# Patient Record
Sex: Male | Born: 1935
Health system: Southern US, Community
[De-identification: ages and names within clinical notes are randomized; demographics above are authoritative.]

## PROBLEM LIST (undated history)

## (undated) DIAGNOSIS — A4101 Sepsis due to Methicillin susceptible Staphylococcus aureus: Secondary | ICD-10-CM

## (undated) DIAGNOSIS — R0602 Shortness of breath: Secondary | ICD-10-CM

## (undated) DIAGNOSIS — T4145XA Adverse effect of unspecified anesthetic, initial encounter: Secondary | ICD-10-CM

## (undated) DIAGNOSIS — D759 Disease of blood and blood-forming organs, unspecified: Secondary | ICD-10-CM

## (undated) DIAGNOSIS — F32A Depression, unspecified: Secondary | ICD-10-CM

## (undated) DIAGNOSIS — R350 Frequency of micturition: Secondary | ICD-10-CM

## (undated) DIAGNOSIS — M1990 Unspecified osteoarthritis, unspecified site: Secondary | ICD-10-CM

## (undated) DIAGNOSIS — J189 Pneumonia, unspecified organism: Secondary | ICD-10-CM

## (undated) DIAGNOSIS — G062 Extradural and subdural abscess, unspecified: Secondary | ICD-10-CM

## (undated) DIAGNOSIS — F329 Major depressive disorder, single episode, unspecified: Secondary | ICD-10-CM

## (undated) DIAGNOSIS — G629 Polyneuropathy, unspecified: Secondary | ICD-10-CM

## (undated) DIAGNOSIS — J449 Chronic obstructive pulmonary disease, unspecified: Secondary | ICD-10-CM

## (undated) DIAGNOSIS — E785 Hyperlipidemia, unspecified: Secondary | ICD-10-CM

## (undated) DIAGNOSIS — G4730 Sleep apnea, unspecified: Secondary | ICD-10-CM

## (undated) DIAGNOSIS — R413 Other amnesia: Secondary | ICD-10-CM

## (undated) DIAGNOSIS — T8859XA Other complications of anesthesia, initial encounter: Secondary | ICD-10-CM

## (undated) DIAGNOSIS — S065X9A Traumatic subdural hemorrhage with loss of consciousness of unspecified duration, initial encounter: Secondary | ICD-10-CM

## (undated) DIAGNOSIS — N183 Chronic kidney disease, stage 3 (moderate): Secondary | ICD-10-CM

## (undated) DIAGNOSIS — B029 Zoster without complications: Secondary | ICD-10-CM

## (undated) DIAGNOSIS — T8450XA Infection and inflammatory reaction due to unspecified internal joint prosthesis, initial encounter: Secondary | ICD-10-CM

## (undated) DIAGNOSIS — Z9889 Other specified postprocedural states: Secondary | ICD-10-CM

## (undated) DIAGNOSIS — C439 Malignant melanoma of skin, unspecified: Secondary | ICD-10-CM

## (undated) DIAGNOSIS — Z86718 Personal history of other venous thrombosis and embolism: Secondary | ICD-10-CM

## (undated) HISTORY — PX: SKIN BIOPSY: SHX1

## (undated) HISTORY — PX: TOTAL KNEE ARTHROPLASTY: SHX125

## (undated) HISTORY — DX: Malignant melanoma of skin, unspecified: C43.9

## (undated) HISTORY — PX: KNEE ARTHROSCOPY: SUR90

## (undated) HISTORY — DX: Traumatic subdural hemorrhage with loss of consciousness of unspecified duration, initial encounter: S06.5X9A

## (undated) HISTORY — DX: Sepsis due to methicillin susceptible Staphylococcus aureus: A41.01

## (undated) HISTORY — PX: FILTERING PROCEDURE: SHX5296

## (undated) HISTORY — PX: EXTERNAL EAR SURGERY: SHX627

## (undated) HISTORY — DX: Infection and inflammatory reaction due to unspecified internal joint prosthesis, initial encounter: T84.50XA

## (undated) HISTORY — DX: Extradural and subdural abscess, unspecified: G06.2

## (undated) HISTORY — PX: HERNIA REPAIR: SHX51

## (undated) HISTORY — PX: EYE SURGERY: SHX253

## (undated) HISTORY — DX: Chronic kidney disease, stage 3 (moderate): N18.3

---

## 2000-01-16 ENCOUNTER — Ambulatory Visit (HOSPITAL_COMMUNITY): Admission: RE | Admit: 2000-01-16 | Discharge: 2000-01-16 | Payer: Self-pay | Admitting: Interventional Cardiology

## 2000-05-10 ENCOUNTER — Encounter: Admission: RE | Admit: 2000-05-10 | Discharge: 2000-05-10 | Payer: Self-pay | Admitting: Internal Medicine

## 2000-05-10 ENCOUNTER — Encounter: Payer: Self-pay | Admitting: Internal Medicine

## 2001-10-25 ENCOUNTER — Encounter: Admission: RE | Admit: 2001-10-25 | Discharge: 2001-10-25 | Payer: Self-pay | Admitting: Internal Medicine

## 2001-10-25 ENCOUNTER — Encounter: Payer: Self-pay | Admitting: Internal Medicine

## 2001-10-29 ENCOUNTER — Ambulatory Visit (HOSPITAL_COMMUNITY): Admission: RE | Admit: 2001-10-29 | Discharge: 2001-10-29 | Payer: Self-pay | Admitting: Internal Medicine

## 2002-01-14 ENCOUNTER — Encounter: Payer: Self-pay | Admitting: Specialist

## 2002-01-14 ENCOUNTER — Encounter: Admission: RE | Admit: 2002-01-14 | Discharge: 2002-01-14 | Payer: Self-pay | Admitting: Specialist

## 2002-02-18 ENCOUNTER — Encounter: Payer: Self-pay | Admitting: Specialist

## 2002-02-24 ENCOUNTER — Encounter: Payer: Self-pay | Admitting: Specialist

## 2002-02-24 ENCOUNTER — Inpatient Hospital Stay (HOSPITAL_COMMUNITY): Admission: RE | Admit: 2002-02-24 | Discharge: 2002-03-01 | Payer: Self-pay | Admitting: Specialist

## 2003-10-30 ENCOUNTER — Encounter: Admission: RE | Admit: 2003-10-30 | Discharge: 2003-10-30 | Payer: Self-pay | Admitting: Specialist

## 2004-02-16 ENCOUNTER — Ambulatory Visit (HOSPITAL_BASED_OUTPATIENT_CLINIC_OR_DEPARTMENT_OTHER): Admission: RE | Admit: 2004-02-16 | Discharge: 2004-02-16 | Payer: Self-pay | Admitting: Internal Medicine

## 2004-04-18 ENCOUNTER — Ambulatory Visit (HOSPITAL_COMMUNITY): Admission: RE | Admit: 2004-04-18 | Discharge: 2004-04-18 | Payer: Self-pay | Admitting: Gastroenterology

## 2005-08-31 ENCOUNTER — Ambulatory Visit: Payer: Self-pay | Admitting: Oncology

## 2005-09-02 ENCOUNTER — Ambulatory Visit (HOSPITAL_COMMUNITY): Admission: RE | Admit: 2005-09-02 | Discharge: 2005-09-02 | Payer: Self-pay | Admitting: Oncology

## 2005-09-04 ENCOUNTER — Ambulatory Visit (HOSPITAL_COMMUNITY): Admission: RE | Admit: 2005-09-04 | Discharge: 2005-09-04 | Payer: Self-pay | Admitting: Oncology

## 2005-09-05 ENCOUNTER — Ambulatory Visit (HOSPITAL_COMMUNITY): Admission: RE | Admit: 2005-09-05 | Discharge: 2005-09-05 | Payer: Self-pay | Admitting: Oncology

## 2006-03-29 ENCOUNTER — Ambulatory Visit: Payer: Self-pay | Admitting: Oncology

## 2006-03-29 LAB — CBC WITH DIFFERENTIAL/PLATELET
Basophils Absolute: 0 10*3/uL (ref 0.0–0.1)
Eosinophils Absolute: 0.5 10*3/uL (ref 0.0–0.5)
HGB: 16.3 g/dL (ref 13.0–17.1)
LYMPH%: 22.3 % (ref 14.0–48.0)
MCV: 92 fL (ref 81.6–98.0)
MONO#: 0.9 10*3/uL (ref 0.1–0.9)
MONO%: 11.5 % (ref 0.0–13.0)
NEUT#: 4.6 10*3/uL (ref 1.5–6.5)
Platelets: 234 10*3/uL (ref 145–400)
RDW: 13.3 % (ref 11.2–14.6)
WBC: 7.7 10*3/uL (ref 4.0–10.0)

## 2006-03-29 LAB — COMPREHENSIVE METABOLIC PANEL
BUN: 14 mg/dL (ref 6–23)
CO2: 30 mEq/L (ref 19–32)
Creatinine, Ser: 1.48 mg/dL (ref 0.40–1.50)
Glucose, Bld: 83 mg/dL (ref 70–99)
Total Bilirubin: 0.6 mg/dL (ref 0.3–1.2)
Total Protein: 6.7 g/dL (ref 6.0–8.3)

## 2006-03-29 LAB — LACTATE DEHYDROGENASE: LDH: 204 U/L (ref 94–250)

## 2006-06-11 ENCOUNTER — Ambulatory Visit: Payer: Self-pay | Admitting: Oncology

## 2006-06-13 LAB — CBC WITH DIFFERENTIAL/PLATELET
Basophils Absolute: 0 10*3/uL (ref 0.0–0.1)
EOS%: 3 % (ref 0.0–7.0)
HCT: 47.9 % (ref 38.7–49.9)
HGB: 16.4 g/dL (ref 13.0–17.1)
LYMPH%: 20.3 % (ref 14.0–48.0)
MCH: 31.6 pg (ref 28.0–33.4)
MCV: 92.2 fL (ref 81.6–98.0)
MONO%: 9.6 % (ref 0.0–13.0)
NEUT%: 66.9 % (ref 40.0–75.0)
Platelets: 243 10*3/uL (ref 145–400)

## 2006-06-13 LAB — COMPREHENSIVE METABOLIC PANEL
AST: 18 U/L (ref 0–37)
Alkaline Phosphatase: 49 U/L (ref 39–117)
BUN: 19 mg/dL (ref 6–23)
Creatinine, Ser: 1.54 mg/dL — ABNORMAL HIGH (ref 0.40–1.50)
Glucose, Bld: 100 mg/dL — ABNORMAL HIGH (ref 70–99)

## 2006-11-05 ENCOUNTER — Ambulatory Visit: Payer: Self-pay | Admitting: Oncology

## 2006-11-08 LAB — COMPREHENSIVE METABOLIC PANEL
ALT: <8 U/L (ref 0–53)
Albumin: 4.2 g/dL (ref 3.5–5.2)
CO2: 24 mEq/L (ref 19–32)
Calcium: 9.4 mg/dL (ref 8.4–10.5)
Chloride: 105 mEq/L (ref 96–112)
Creatinine, Ser: 1.56 mg/dL — ABNORMAL HIGH (ref 0.40–1.50)
Sodium: 142 mEq/L (ref 135–145)
Total Protein: 6.6 g/dL (ref 6.0–8.3)

## 2006-11-08 LAB — CBC WITH DIFFERENTIAL/PLATELET
BASO%: 0.7 % (ref 0.0–2.0)
HCT: 46.3 % (ref 38.7–49.9)
MCHC: 35.1 g/dL (ref 32.0–35.9)
MONO#: 1 10*3/uL — ABNORMAL HIGH (ref 0.1–0.9)
NEUT%: 57.2 % (ref 40.0–75.0)
WBC: 7.7 10*3/uL (ref 4.0–10.0)
lymph#: 2 10*3/uL (ref 0.9–3.3)

## 2006-11-21 ENCOUNTER — Ambulatory Visit (HOSPITAL_COMMUNITY): Admission: RE | Admit: 2006-11-21 | Discharge: 2006-11-21 | Payer: Self-pay | Admitting: Oncology

## 2007-02-08 ENCOUNTER — Ambulatory Visit: Payer: Self-pay | Admitting: Oncology

## 2007-02-12 LAB — CBC WITH DIFFERENTIAL/PLATELET
BASO%: 0.3 % (ref 0.0–2.0)
MCHC: 35.2 g/dL (ref 32.0–35.9)
MONO#: 1.1 10*3/uL — ABNORMAL HIGH (ref 0.1–0.9)
RBC: 4.9 10*6/uL (ref 4.20–5.71)
RDW: 12.6 % (ref 11.2–14.6)
WBC: 8 10*3/uL (ref 4.0–10.0)
lymph#: 2 10*3/uL (ref 0.9–3.3)

## 2007-02-12 LAB — COMPREHENSIVE METABOLIC PANEL
ALT: <8 U/L (ref 0–53)
AST: 18 U/L (ref 0–37)
CO2: 24 mEq/L (ref 19–32)
Calcium: 9.2 mg/dL (ref 8.4–10.5)
Chloride: 103 mEq/L (ref 96–112)
Sodium: 140 mEq/L (ref 135–145)
Total Bilirubin: 0.9 mg/dL (ref 0.3–1.2)
Total Protein: 6.5 g/dL (ref 6.0–8.3)

## 2007-02-12 LAB — LACTATE DEHYDROGENASE: LDH: 194 U/L (ref 94–250)

## 2007-06-10 ENCOUNTER — Ambulatory Visit: Payer: Self-pay | Admitting: Oncology

## 2007-06-14 LAB — COMPREHENSIVE METABOLIC PANEL
ALT: <8 U/L (ref 0–53)
AST: 15 U/L (ref 0–37)
Alkaline Phosphatase: 53 U/L (ref 39–117)
BUN: 17 mg/dL (ref 6–23)
Calcium: 9.2 mg/dL (ref 8.4–10.5)
Chloride: 102 mEq/L (ref 96–112)
Creatinine, Ser: 1.67 mg/dL — ABNORMAL HIGH (ref 0.40–1.50)
Total Bilirubin: 0.7 mg/dL (ref 0.3–1.2)

## 2007-06-14 LAB — CBC WITH DIFFERENTIAL/PLATELET
BASO%: 0.3 % (ref 0.0–2.0)
Basophils Absolute: 0 10*3/uL (ref 0.0–0.1)
EOS%: 4.3 % (ref 0.0–7.0)
HCT: 46.5 % (ref 38.7–49.9)
MCH: 31.9 pg (ref 28.0–33.4)
MCHC: 34.9 g/dL (ref 32.0–35.9)
MCV: 91.5 fL (ref 81.6–98.0)
MONO%: 14 % — ABNORMAL HIGH (ref 0.0–13.0)
NEUT%: 61.4 % (ref 40.0–75.0)
lymph#: 1.8 10*3/uL (ref 0.9–3.3)

## 2007-12-04 ENCOUNTER — Encounter: Admission: RE | Admit: 2007-12-04 | Discharge: 2007-12-04 | Payer: Self-pay | Admitting: Orthopedic Surgery

## 2007-12-31 ENCOUNTER — Inpatient Hospital Stay (HOSPITAL_COMMUNITY): Admission: RE | Admit: 2007-12-31 | Discharge: 2008-01-05 | Payer: Self-pay | Admitting: Orthopedic Surgery

## 2008-04-30 ENCOUNTER — Ambulatory Visit (HOSPITAL_BASED_OUTPATIENT_CLINIC_OR_DEPARTMENT_OTHER): Admission: RE | Admit: 2008-04-30 | Discharge: 2008-04-30 | Payer: Self-pay | Admitting: Critical Care Medicine

## 2008-04-30 ENCOUNTER — Ambulatory Visit: Payer: Self-pay | Admitting: Critical Care Medicine

## 2008-04-30 DIAGNOSIS — M810 Age-related osteoporosis without current pathological fracture: Secondary | ICD-10-CM | POA: Insufficient documentation

## 2008-04-30 DIAGNOSIS — H9190 Unspecified hearing loss, unspecified ear: Secondary | ICD-10-CM | POA: Insufficient documentation

## 2008-04-30 DIAGNOSIS — M1990 Unspecified osteoarthritis, unspecified site: Secondary | ICD-10-CM | POA: Insufficient documentation

## 2008-04-30 DIAGNOSIS — H40009 Preglaucoma, unspecified, unspecified eye: Secondary | ICD-10-CM | POA: Insufficient documentation

## 2008-04-30 DIAGNOSIS — J45909 Unspecified asthma, uncomplicated: Secondary | ICD-10-CM | POA: Insufficient documentation

## 2008-04-30 DIAGNOSIS — H8109 Meniere's disease, unspecified ear: Secondary | ICD-10-CM | POA: Insufficient documentation

## 2008-04-30 DIAGNOSIS — G4730 Sleep apnea, unspecified: Secondary | ICD-10-CM | POA: Insufficient documentation

## 2008-04-30 DIAGNOSIS — G2581 Restless legs syndrome: Secondary | ICD-10-CM | POA: Insufficient documentation

## 2008-04-30 DIAGNOSIS — Z77090 Contact with and (suspected) exposure to asbestos: Secondary | ICD-10-CM | POA: Insufficient documentation

## 2008-04-30 DIAGNOSIS — G589 Mononeuropathy, unspecified: Secondary | ICD-10-CM | POA: Insufficient documentation

## 2008-04-30 DIAGNOSIS — D049 Carcinoma in situ of skin, unspecified: Secondary | ICD-10-CM | POA: Insufficient documentation

## 2008-04-30 DIAGNOSIS — J449 Chronic obstructive pulmonary disease, unspecified: Secondary | ICD-10-CM | POA: Insufficient documentation

## 2008-05-15 ENCOUNTER — Ambulatory Visit: Payer: Self-pay | Admitting: Critical Care Medicine

## 2008-05-18 ENCOUNTER — Encounter: Payer: Self-pay | Admitting: Critical Care Medicine

## 2008-06-04 ENCOUNTER — Ambulatory Visit: Payer: Self-pay | Admitting: Critical Care Medicine

## 2008-06-18 ENCOUNTER — Telehealth (INDEPENDENT_AMBULATORY_CARE_PROVIDER_SITE_OTHER): Payer: Self-pay | Admitting: *Deleted

## 2008-06-22 ENCOUNTER — Ambulatory Visit: Payer: Self-pay | Admitting: Critical Care Medicine

## 2008-08-20 ENCOUNTER — Ambulatory Visit: Payer: Self-pay | Admitting: Critical Care Medicine

## 2008-10-16 ENCOUNTER — Encounter: Payer: Self-pay | Admitting: Critical Care Medicine

## 2008-12-24 ENCOUNTER — Ambulatory Visit: Payer: Self-pay | Admitting: Critical Care Medicine

## 2009-04-13 ENCOUNTER — Encounter: Payer: Self-pay | Admitting: Critical Care Medicine

## 2009-04-29 ENCOUNTER — Ambulatory Visit: Payer: Self-pay | Admitting: Critical Care Medicine

## 2009-05-04 ENCOUNTER — Telehealth: Payer: Self-pay | Admitting: Critical Care Medicine

## 2009-05-07 ENCOUNTER — Telehealth (INDEPENDENT_AMBULATORY_CARE_PROVIDER_SITE_OTHER): Payer: Self-pay | Admitting: *Deleted

## 2009-05-07 ENCOUNTER — Ambulatory Visit: Payer: Self-pay | Admitting: Critical Care Medicine

## 2009-05-12 ENCOUNTER — Encounter: Payer: Self-pay | Admitting: Critical Care Medicine

## 2009-05-20 ENCOUNTER — Ambulatory Visit: Payer: Self-pay | Admitting: Critical Care Medicine

## 2009-12-09 ENCOUNTER — Ambulatory Visit: Payer: Self-pay | Admitting: Critical Care Medicine

## 2009-12-09 DIAGNOSIS — J309 Allergic rhinitis, unspecified: Secondary | ICD-10-CM | POA: Insufficient documentation

## 2010-04-21 ENCOUNTER — Ambulatory Visit: Payer: Self-pay | Admitting: Critical Care Medicine

## 2010-06-24 ENCOUNTER — Ambulatory Visit: Payer: Self-pay | Admitting: Critical Care Medicine

## 2010-06-24 ENCOUNTER — Telehealth: Payer: Self-pay | Admitting: Critical Care Medicine

## 2010-07-14 ENCOUNTER — Ambulatory Visit: Payer: Self-pay | Admitting: Critical Care Medicine

## 2010-08-11 ENCOUNTER — Ambulatory Visit: Admit: 2010-08-11 | Payer: Self-pay | Admitting: Critical Care Medicine

## 2010-08-16 NOTE — Progress Notes (Signed)
Summary: CXR results  Phone Note Outgoing Call   Reason for Call: Discuss lab or test results Summary of Call: Call pt and tell him his cxr was normal. take meds as rx at this ov today Initial call taken by: Storm Frisk MD,  June 24, 2010 11:44 AM  Follow-up for Phone Call        Called, spoke with pt.  he was informed of above cxr results and recs per PW and verbalized understanding of this.   Follow-up by: Gweneth Dimitri RN,  June 24, 2010 12:31 PM

## 2010-08-16 NOTE — Assessment & Plan Note (Signed)
Summary: Pulmonary OV   Primary Provider/Referring Provider:  Dr. Theressa Millard  CC:  Follow up.  Breathing worse at times -- having increased SOB with activity.  Denies wheezing, chest tightness, and cough..  History of Present Illness:  Pulmonary OV        This is an 77 years old male with Golds III COPD primary emphysema DLCO 69% FeV1 94% 10/09 This pt has been with [facility] MD in the past.  Dx COPD for 20 yrs.  Last pfts 11/08 at [facility]: DLCO 58%.   also OSA on CPAP   Dec 09, 2009 10:42 AM Patient stable symptoms. There is no cough or mucus production is noted. Patient is using CPAP nightly. Pt denies any significant sore throat, nasal congestion or excess secretions, fever, chills, sweats, unintended weight loss, pleurtic or exertional chest pain, orthopnea PND, or leg swelling Pt denies any increase in rescue therapy over baseline, denies waking up needing it or having any early am or nocturnal exacerbations of coughing/wheezing/or dyspnea.   April 21, 2010 11:27 AM copd f/u.  No real changes.  Is dyspneic with exertion.  Worse climbing stairs.  No chest pain. No real cough.   Pt denies any significant sore throat, nasal congestion or excess secretions, fever, chills, sweats, unintended weight loss, pleurtic or exertional chest pain, orthopnea PND, or leg swelling Pt denies any increase in rescue therapy over baseline, denies waking up needing it or having any early am or nocturnal exacerbations of coughing/wheezing/or dyspnea. Still on cpap at night,    Preventive Screening-Counseling & Management  Alcohol-Tobacco     Smoking Status: quit > 6 months     Year Quit: 1960     Pack years: 63yrs, 3ppd  Current Medications (verified): 1)  Wellbutrin Sr 200 Mg Xr12h-Tab (Bupropion Hcl) .... Take 1 Tablet By Mouth Two Times A Day 2)  Zocor 40 Mg Tabs (Simvastatin) .... Take 1 Tab By Mouth At Bedtime 3)  Valium 5 Mg Tabs (Diazepam) .... Take 1 Tablet By Mouth Three Times A  Day As Needed 4)  Meclizine Hcl 25 Mg Tabs (Meclizine Hcl) .... 3-4 Tabs By Mouth Per Day 5)  Triamterene-Hctz 75-50 Mg Tabs (Triamterene-Hctz) .... 1/2 Tab By Mouth Once Daily 6)  Cardura 4 Mg Tabs (Doxazosin Mesylate) .... Take 1 Tablet By Mouth Two Times A Day 7)  Klor-Con 20 Meq Pack (Potassium Chloride) .... Take 1 Tablet By Mouth Once A Day 8)  Singulair 10 Mg Tabs (Montelukast Sodium) .... Take 1 Tablet By Mouth Once A Day 9)  Combivent 103-18 Mcg/act Aero (Ipratropium-Albuterol) .... Inhale 2 Puffs Every 4 Hours As Needed For Shortness of Breath 10)  Hydrocodone-Acetaminophen 5-500 Mg Tabs (Hydrocodone-Acetaminophen) .Marland Kitchen.. 1 Tab By Mouth Four Times A Day As Needed 11)  Requip 1 Mg Tabs (Ropinirole Hcl) .... Take 1 Tablet By Mouth Once A Day 12)  Bayer Aspirin Ec Low Dose 81 Mg Tbec (Aspirin) .... Take 1 Tablet By Mouth Once A Day 13)  Cpap .Marland Kitchen.. 13 14)  Fluticasone Propionate 50 Mcg/act Susp (Fluticasone Propionate) .... Two Sprays Each Nostril Daily As Needed 15)  Ranitidine Hcl 150 Mg Caps (Ranitidine Hcl) .... Take 1 Capsule By Mouth Two Times A Day 16)  Advair Diskus 250-50 Mcg/dose  Misc (Fluticasone-Salmeterol) .... One Puff Twice Daily  Allergies (verified): No Known Drug Allergies  Past History:  Past medical, surgical, family and social histories (including risk factors) reviewed, and no changes noted (except as noted below).  Past Medical History: Reviewed history from 06/04/2008 and no changes required. RESTLESS LEG SYNDROME (ICD-333.94) GLAUCOMA, BORDERLINE (ICD-365.00) MENIERE'S DISEASE (ICD-386.00) HISTORY OF ASBESTOS EXPOSURE (ICD-V15.84) DIZZINESS (ICD-780.4) HEARING LOSS (ICD-389.9) CARCINOMA IN SITU, SKIN (ICD-232.9) NEUROPATHY (ICD-355.9) OSTEOPOROSIS (ICD-733.00) ARTHRITIS (ICD-716.90) SLEEP APNEA (ICD-780.57) COPD (ICD-496)    -11/08: DLCO 58%  FeV1 85%  TLC 107%  RV 152%    -10/09  DLCO 69%   FeV1  94%      TLC133%   RV 146% ASTHMA (ICD-493.90)      Past Surgical History: Reviewed history from 04/30/2008 and no changes required. skin cancer surgery L TKR times two Ear surgery R  menieres dz  Family History: Reviewed history from 04/30/2008 and no changes required. father had emphysema, allergies, asthma  Social History: Reviewed history from 04/30/2008 and no changes required. 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  3ppd x 30 yrs  Review of Systems  The patient denies shortness of breath with activity, shortness of breath at rest, productive cough, non-productive cough, coughing up blood, chest pain, irregular heartbeats, acid heartburn, indigestion, loss of appetite, weight change, abdominal pain, difficulty swallowing, sore throat, tooth/dental problems, headaches, nasal congestion/difficulty breathing through nose, sneezing, itching, ear ache, anxiety, depression, hand/feet swelling, joint stiffness or pain, rash, change in color of mucus, and fever.    Vital Signs:  Patient profile:   77 year old male Height:      69 inches Weight:      184 pounds BMI:     27.27 O2 Sat:      97 % on Room air Temp:     97.8 degrees F oral Pulse rate:   82 / minute BP sitting:   140 / 70  (left arm) Cuff size:   regular  Vitals Entered By: Gweneth Dimitri RN (April 21, 2010 11:19 AM)  Nutrition Counseling: Patient's BMI is greater than 25 and therefore counseled on weight management options.  O2 Flow:  Room air CC: Follow up.  Breathing worse at times -- having increased SOB with activity.  Denies wheezing, chest tightness, cough. Comments Medications reviewed with patient Daytime contact number verified with patient. Gweneth Dimitri RN  April 21, 2010 11:20 AM    Physical Exam  Additional Exam:  Gen: WD WN    WM     in NAD    NCAT Heent:  no jvd, no TMG, no cervical LNademopathy, orophyx clear,  nares with purulent drainage and erythema Cor: RRR nl s1/s2  no s3/s4  no m r h g Abd: soft NT  BSA   no masses  No HSM  no rebound or guarding Ext perfused with no c v e v.d Neuro: intact, moves all 4s, CN II-XII intact, DTRs intact Chest: distant BS  no wheezes, rales, rhonchi   no egophony  no consolidative breath sounds, mild hyperresonance to percussion Skin: clear  Genital/Rectal :deferred    Pre-Spirometry FEV1    Value: 2.38 L     Pred: 2.53 L     Impression & Recommendations:  Problem # 1:  COPD (ICD-496) Assessment Unchanged  COPD Golds stage III non oxygen dependent.  Stable airway disease.  Mild to moderate sleep apnea.   plan:  no change in pulm meds rov 5  months  Medications Added to Medication List This Visit: 1)  Cpap  .Marland Kitchen.. 13 cmh20  Complete Medication List: 1)  Wellbutrin Sr 200 Mg Xr12h-tab (Bupropion hcl) .... Take 1 tablet by  mouth two times a day 2)  Zocor 40 Mg Tabs (Simvastatin) .... Take 1 tab by mouth at bedtime 3)  Meclizine Hcl 25 Mg Tabs (Meclizine hcl) .... 3-4 tabs by mouth per day 4)  Triamterene-hctz 75-50 Mg Tabs (Triamterene-hctz) .... 1/2 tab by mouth once daily 5)  Cardura 4 Mg Tabs (Doxazosin mesylate) .... Take 1 tablet by mouth two times a day 6)  Klor-con 20 Meq Pack (Potassium chloride) .... Take 1 tablet by mouth once a day 7)  Singulair 10 Mg Tabs (Montelukast sodium) .... Take 1 tablet by mouth once a day 8)  Requip 1 Mg Tabs (Ropinirole hcl) .... Take 1 tablet by mouth once a day 9)  Bayer Aspirin Ec Low Dose 81 Mg Tbec (Aspirin) .... Take 1 tablet by mouth once a day 10)  Cpap  .Marland Kitchen.. 13 cmh20 11)  Ranitidine Hcl 150 Mg Caps (Ranitidine hcl) .... Take 1 capsule by mouth two times a day 12)  Advair Diskus 250-50 Mcg/dose Misc (Fluticasone-salmeterol) .... One puff twice daily 13)  Hydrocodone-acetaminophen 5-500 Mg Tabs (Hydrocodone-acetaminophen) .Marland Kitchen.. 1 tab by mouth four times a day as needed 14)  Fluticasone Propionate 50 Mcg/act Susp (Fluticasone propionate) .... Two sprays each nostril daily as needed 15)  Valium 5 Mg Tabs  (Diazepam) .... Take 1 tablet by mouth three times a day as needed 16)  Combivent 103-18 Mcg/act Aero (Ipratropium-albuterol) .... Inhale 2 puffs every 4 hours as needed for shortness of breath  Other Orders: Est. Patient Level III (81191)  Patient Instructions: 1)  No change in medications 2)  Return in     5     months Prescriptions: ADVAIR DISKUS 250-50 MCG/DOSE  MISC (FLUTICASONE-SALMETEROL) One puff twice daily  #1 x 6   Entered and Authorized by:   Storm Frisk MD   Signed by:   Storm Frisk MD on 04/21/2010   Method used:   [facility]ron[facility] to        [facility] [phone]* (retail)       [address]       [place], [place]  [address]       Ph: [phone]       Fax: [phone]   RxID:   [id number] SINGULAIR 10 MG TABS (MONTELUKAST SODIUM) Take 1 tablet by mouth once a day  #30 x 6   Entered and Authorized by:   Storm Frisk MD   Signed by:   Storm Frisk MD on 04/21/2010   Method used:   [facility]ronically to        [facility] [phone]* (retail)       [address]       [place], [place]  [address]       Ph: [phone]       Fax: [phone]   RxID:   [id number]    Immunization History:  Influenza Immunization History:    Influenza:  historical (04/20/2010)   Prevention & Chronic Care Immunizations   Influenza vaccine: Historical  (04/20/2010)    Tetanus booster: Not documented    Pneumococcal vaccine: Pneumovax  (04/29/2009)    H. zoster vaccine: Not documented  Colorectal Screening   Hemoccult: Not documented    Colonoscopy: Not documented  Other Screening   PSA: Not documented   Smoking status: quit > 6 months  (04/21/2010)  Lipids   Total Cholesterol: Not documented  LDL: Not documented   LDL Direct: Not documented   HDL: Not documented   Triglycerides: Not documented  Appended Document: Pulmonary OV fax Theressa Millard

## 2010-08-16 NOTE — Assessment & Plan Note (Signed)
Summary: Pulmonary OV   Primary Provider/Referring Provider:  Dr. Theressa Millard  CC:  follow up - states breathing is the same since last OV with SOB w/ activity.  states is still taking the advair.Marland Kitchen  History of Present Illness:  Pulmonary OV        This is an 77 years old male with Golds III COPD primary emphysema DLCO 69% FeV1 94% 10/09 This pt has been with [facility] MD in the past.  Dx COPD for 20 yrs.  Last pfts 11/08 at [facility]: DLCO 58%.   also OSA on CPAP   Dec 09, 2009 10:42 AM Patient stable symptoms. There is no cough or mucus production is noted. Patient is using CPAP nightly. Pt denies any significant sore throat, nasal congestion or excess secretions, fever, chills, sweats, unintended weight loss, pleurtic or exertional chest pain, orthopnea PND, or leg swelling Pt denies any increase in rescue therapy over baseline, denies waking up needing it or having any early am or nocturnal exacerbations of coughing/wheezing/or dyspnea.      Preventive Screening-Counseling & Management  Alcohol-Tobacco     Smoking Status: quit > 6 months  Current Medications (verified): 1)  Wellbutrin Sr 200 Mg Xr12h-Tab (Bupropion Hcl) .... Take 1 Tablet By Mouth Two Times A Day 2)  Zocor 40 Mg Tabs (Simvastatin) .... Take 1 Tab By Mouth At Bedtime 3)  Valium 5 Mg Tabs (Diazepam) .... Take 1 Tablet By Mouth Three Times A Day As Needed 4)  Meclizine Hcl 25 Mg Tabs (Meclizine Hcl) .... 3-4 Tabs By Mouth Per Day 5)  Triamterene-Hctz 75-50 Mg Tabs (Triamterene-Hctz) .... 1/2 Tab By Mouth Once Daily 6)  Cardura 4 Mg Tabs (Doxazosin Mesylate) .... Take 1 Tablet By Mouth Two Times A Day 7)  Klor-Con 20 Meq Pack (Potassium Chloride) .... Take 1 Tablet By Mouth Once A Day 8)  Singulair 10 Mg Tabs (Montelukast Sodium) .... Take 1 Tablet By Mouth Once A Day 9)  Combivent 103-18 Mcg/act Aero (Ipratropium-Albuterol) .... Inhale 2 Puffs Every 4 Hours As Needed For Shortness of Breath 10)   Hydrocodone-Acetaminophen 5-500 Mg Tabs (Hydrocodone-Acetaminophen) .Marland Kitchen.. 1 Tab By Mouth Four Times A Day As Needed 11)  Requip 1 Mg Tabs (Ropinirole Hcl) .... Take 1 Tablet By Mouth Once A Day 12)  Bayer Aspirin Ec Low Dose 81 Mg Tbec (Aspirin) .... Take 1 Tablet By Mouth Once A Day 13)  Cpap .Marland Kitchen.. 13 14)  Fluticasone Propionate 50 Mcg/act Susp (Fluticasone Propionate) .... Two Sprays Each Nostril Daily As Needed 15)  Ranitidine Hcl 150 Mg Caps (Ranitidine Hcl) .... Take 1 Capsule By Mouth Two Times A Day 16)  Fosamax 70 Mg Tabs (Alendronate Sodium) .Marland Kitchen.. 1 Tab By Mouth Once A Week 17)  Advair Diskus 250-50 Mcg/dose  Misc (Fluticasone-Salmeterol) .... One Puff Twice Daily  Allergies (verified): No Known Drug Allergies  Past History:  Past medical, surgical, family and social histories (including risk factors) reviewed, and no changes noted (except as noted below).  Past Medical History: Reviewed history from 06/04/2008 and no changes required. RESTLESS LEG SYNDROME (ICD-333.94) GLAUCOMA, BORDERLINE (ICD-365.00) MENIERE'S DISEASE (ICD-386.00) HISTORY OF ASBESTOS EXPOSURE (ICD-V15.84) DIZZINESS (ICD-780.4) HEARING LOSS (ICD-389.9) CARCINOMA IN SITU, SKIN (ICD-232.9) NEUROPATHY (ICD-355.9) OSTEOPOROSIS (ICD-733.00) ARTHRITIS (ICD-716.90) SLEEP APNEA (ICD-780.57) COPD (ICD-496)    -11/08: DLCO 58%  FeV1 85%  TLC 107%  RV 152%    -10/09  DLCO 69%   FeV1  94%      TLC133%   RV 146%  ASTHMA (ICD-493.90)     Past Surgical History: Reviewed history from 04/30/2008 and no changes required. skin cancer surgery L TKR times two Ear surgery R  menieres dz  Family History: Reviewed history from 04/30/2008 and no changes required. father had emphysema, allergies, asthma  Social History: Reviewed history from 04/30/2008 and no changes required. 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   Review of Systems       The patient complains of shortness of breath with activity  and nasal congestion/difficulty breathing through nose.  The patient denies shortness of breath at rest, productive cough, non-productive cough, coughing up blood, chest pain, irregular heartbeats, acid heartburn, indigestion, loss of appetite, weight change, abdominal pain, difficulty swallowing, sore throat, tooth/dental problems, headaches, sneezing, itching, ear ache, anxiety, depression, hand/feet swelling, joint stiffness or pain, rash, change in color of mucus, and fever.    Vital Signs:  Patient profile:   77 year old male Height:      69 inches Weight:      181 pounds BMI:     26.83 O2 Sat:      95 % on Room air Temp:     97.7 degrees F oral Pulse rate:   78 / minute BP sitting:   152 / 82  (left arm) Cuff size:   regular  Vitals Entered By: Boone Master CNA/MA (Dec 09, 2009 10:32 AM)  O2 Flow:  Room air CC: follow up - states breathing is the same since last OV with SOB w/ activity.  states is still taking the advair. Comments Medications reviewed with patient Daytime contact number verified with patient. Boone Master CNA/MA  Dec 09, 2009 10:32 AM    Physical Exam  Additional Exam:  Gen: WD WN    WM     in NAD    NCAT Heent:  no jvd, no TMG, no cervical LNademopathy, orophyx clear,  nares with purulent drainage and erythema Cor: RRR nl s1/s2  no s3/s4  no m r h g Abd: soft NT BSA   no masses  No HSM  no rebound or guarding Ext perfused with no c v e v.d Neuro: intact, moves all 4s, CN II-XII intact, DTRs intact Chest: distant BS  no wheezes, rales, rhonchi   no egophony  no consolidative breath sounds, mild hyperresonance to percussion Skin: clear  Genital/Rectal :deferred    Impression & Recommendations:  Problem # 1:  COPD (ICD-496) Assessment Unchanged  COPD Golds stage III non oxygen dependent.  Stable airway disease.  Mild to moderate sleep apnea.   plan:  no change in pulm meds rov 5  months  Medications Added to Medication List This Visit: 1)   Fluticasone Propionate 50 Mcg/act Susp (Fluticasone propionate) .... Two sprays each nostril daily as needed 2)  Ranitidine Hcl 150 Mg Caps (Ranitidine hcl) .... Take 1 capsule by mouth two times a day 3)  Fosamax 70 Mg Tabs (Alendronate sodium) .Marland Kitchen.. 1 tab by mouth once a week 4)  Advair Diskus 250-50 Mcg/dose Misc (Fluticasone-salmeterol) .... One puff twice daily  Complete Medication List: 1)  Wellbutrin Sr 200 Mg Xr12h-tab (Bupropion hcl) .... Take 1 tablet by mouth two times a day 2)  Zocor 40 Mg Tabs (Simvastatin) .... Take 1 tab by mouth at bedtime 3)  Valium 5 Mg Tabs (Diazepam) .... Take 1 tablet by mouth three times a day as needed 4)  Meclizine Hcl 25 Mg Tabs (Meclizine hcl) .... 3-4 tabs by mouth per day 5)  Triamterene-hctz 75-50 Mg Tabs (Triamterene-hctz) .... 1/2 tab by mouth once daily 6)  Cardura 4 Mg Tabs (Doxazosin mesylate) .... Take 1 tablet by mouth two times a day 7)  Klor-con 20 Meq Pack (Potassium chloride) .... Take 1 tablet by mouth once a day 8)  Singulair 10 Mg Tabs (Montelukast sodium) .... Take 1 tablet by mouth once a day 9)  Combivent 103-18 Mcg/act Aero (Ipratropium-albuterol) .... Inhale 2 puffs every 4 hours as needed for shortness of breath 10)  Hydrocodone-acetaminophen 5-500 Mg Tabs (Hydrocodone-acetaminophen) .Marland Kitchen.. 1 tab by mouth four times a day as needed 11)  Requip 1 Mg Tabs (Ropinirole hcl) .... Take 1 tablet by mouth once a day 12)  Bayer Aspirin Ec Low Dose 81 Mg Tbec (Aspirin) .... Take 1 tablet by mouth once a day 13)  Cpap  .Marland Kitchen.. 13 14)  Fluticasone Propionate 50 Mcg/act Susp (Fluticasone propionate) .... Two sprays each nostril daily as needed 15)  Ranitidine Hcl 150 Mg Caps (Ranitidine hcl) .... Take 1 capsule by mouth two times a day 16)  Fosamax 70 Mg Tabs (Alendronate sodium) .Marland Kitchen.. 1 tab by mouth once a week 17)  Advair Diskus 250-50 Mcg/dose Misc (Fluticasone-salmeterol) .... One puff twice daily  Other Orders: Prescription Created  Electronically [id number]) Est. Patient Level III (32440)  Patient Instructions: 1)  No change in medications 2)  Return in     5     months Prescriptions: FLUTICASONE PROPIONATE 50 MCG/ACT SUSP (FLUTICASONE PROPIONATE) Two sprays each nostril daily as needed  #1 x 6   Entered and Authorized by:   Storm Frisk MD   Signed by:   Storm Frisk MD on 12/09/2009   Method used:   [facility]ronically to        [facility] [phone]* (retail)       [address]       [place], [place]  [address]       Ph: [phone]       Fax: [phone]   RxID:   [id number] ADVAIR DISKUS 250-50 MCG/DOSE  MISC (FLUTICASONE-SALMETEROL) One puff twice daily  #1 x 6   Entered and Authorized by:   Storm Frisk MD   Signed by:   Storm Frisk MD on 12/09/2009   Method used:   Electronically to        [facility] [phone]* (retail)       [address]       [place], [place]  [address]       Ph: [phone]       Fax: [phone]   RxID:   [id number] SINGULAIR 10 MG TABS (MONTELUKAST SODIUM) Take 1 tablet by mouth once a day  #30 x 6   Entered and Authorized by:   Storm Frisk MD   Signed by:   Storm Frisk MD on 12/09/2009   Method used:   [facility]ronically to        [facility] [phone]* (retail)       [address]       [place], [place]  [address]       Ph: [phone]       Fax: [phone]   RxID:   [id number]   Appended Document: Pulmonary OV fax Theressa Millard

## 2010-08-18 NOTE — Assessment & Plan Note (Signed)
Summary: Pulmonary OV   Primary Provider/Referring Provider:  Dr. Theressa Millard  CC:  2 wk follow up.  c/o nonprod cough and hoarseness x 4 days.  Denies difficutly breathing, wheezing, and chest tightness. Marland Kitchen  History of Present Illness:  Pulmonary OV        This is an 77 years old male with Golds III COPD primary emphysema DLCO 69% FeV1 94% 10/09 This pt has been with [facility] MD in the past.  Dx COPD for 20 yrs.  Last pfts 11/08 at [facility]: DLCO 58%.   also OSA on CPAP   Dec 09, 2009 10:42 AM Patient stable symptoms. There is no cough or mucus production is noted. Patient is using CPAP nightly. Pt denies any significant sore throat, nasal congestion or excess secretions, fever, chills, sweats, unintended weight loss, pleurtic or exertional chest pain, orthopnea PND, or leg swelling Pt denies any increase in rescue therapy over baseline, denies waking up needing it or having any early am or nocturnal exacerbations of coughing/wheezing/or dyspnea.   April 21, 2010 11:27 AM copd f/u.  No real changes.  Is dyspneic with exertion.  Worse climbing stairs.  No chest pain. No real cough.   Pt denies any significant sore throat, nasal congestion or excess secretions, fever, chills, sweats, unintended weight loss, pleurtic or exertional chest pain, orthopnea PND, or leg swelling Pt denies any increase in rescue therapy over baseline, denies waking up needing it or having any early am or nocturnal exacerbations of coughing/wheezing/or dyspnea. Still on cpap at night, June 24, 2010 10:45 AM Onset 24hrs of congestion, then overnite severe coughing fits and chest tight.  Mucus is yellow.  No chest pain.  Notes no wheeze.  No f/c/s Notes more dyspnea.   No chest pain. Had skin cancers removed from the face .  July 14, 2010 10:33 AM Was ok for a few weeks and now is worse .  Not dyspneic.  More cough, and hoarse.  Mucus is not as much.  Mucus yellow to clear. No chest pain.  wil locugh  heavy at night when sleep.  No real heartburn.  Throat is not sore.  No real pn drip.  Uses nasal spray.  Current Medications (verified): 1)  Wellbutrin Sr 200 Mg Xr12h-Tab (Bupropion Hcl) .... Take 1 Tablet By Mouth Two Times A Day 2)  Zocor 40 Mg Tabs (Simvastatin) .... Take 1 Tab By Mouth At Bedtime 3)  Meclizine Hcl 25 Mg Tabs (Meclizine Hcl) .... 3-4 Tabs By Mouth Per Day As Needed 4)  Triamterene-Hctz 75-50 Mg Tabs (Triamterene-Hctz) .... 1/2 Tab By Mouth Once Daily 5)  Cardura 4 Mg Tabs (Doxazosin Mesylate) .... Take 1 Tablet By Mouth Two Times A Day 6)  Klor-Con 20 Meq Pack (Potassium Chloride) .... Take 1 Tablet By Mouth Once A Day 7)  Singulair 10 Mg Tabs (Montelukast Sodium) .... Take 1 Tablet By Mouth Once A Day 8)  Requip 1 Mg Tabs (Ropinirole Hcl) .... Take 1 Tablet By Mouth Once A Day 9)  Bayer Aspirin Ec Low Dose 81 Mg Tbec (Aspirin) .... Take 1 Tablet By Mouth Once A Day 10)  Cpap .Marland Kitchen.. 13 Cmh20 11)  Ranitidine Hcl 150 Mg Caps (Ranitidine Hcl) .... Take 1 Capsule By Mouth Two Times A Day 12)  Advair Diskus 250-50 Mcg/dose  Misc (Fluticasone-Salmeterol) .... One Puff Twice Daily 13)  Hydrocodone-Acetaminophen 5-500 Mg Tabs (Hydrocodone-Acetaminophen) .Marland Kitchen.. 1 Tab By Mouth Four Times A Day As Needed 14)  Fluticasone Propionate 50 Mcg/act Susp (Fluticasone Propionate) .... Two Sprays Each Nostril Daily As Needed 15)  Valium 5 Mg Tabs (Diazepam) .... Take 1 Tablet By Mouth Three Times A Day As Needed 16)  Combivent 103-18 Mcg/act Aero (Ipratropium-Albuterol) .... Inhale 2 Puffs Every 4 Hours As Needed For Shortness of Breath  Allergies (verified): No Known Drug Allergies  Past History:  Past medical, surgical, family and social histories (including risk factors) reviewed, and no changes noted (except as noted below).  Past Medical History: Reviewed history from 06/04/2008 and no changes required. RESTLESS LEG SYNDROME (ICD-333.94) GLAUCOMA, BORDERLINE (ICD-365.00) MENIERE'S  DISEASE (ICD-386.00) HISTORY OF ASBESTOS EXPOSURE (ICD-V15.84) DIZZINESS (ICD-780.4) HEARING LOSS (ICD-389.9) CARCINOMA IN SITU, SKIN (ICD-232.9) NEUROPATHY (ICD-355.9) OSTEOPOROSIS (ICD-733.00) ARTHRITIS (ICD-716.90) SLEEP APNEA (ICD-780.57) COPD (ICD-496)    -11/08: DLCO 58%  FeV1 85%  TLC 107%  RV 152%    -10/09  DLCO 69%   FeV1  94%      TLC133%   RV 146% ASTHMA (ICD-493.90)     Past Surgical History: Reviewed history from 04/30/2008 and no changes required. skin cancer surgery L TKR times two Ear surgery R  menieres dz  Family History: Reviewed history from 04/30/2008 and no changes required. father had emphysema, allergies, asthma  Social History: Reviewed history from 04/21/2010 and no changes required. 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  3ppd x 30 yrs  Review of Systems       The patient complains of shortness of breath with activity and productive cough.  The patient denies shortness of breath at rest, non-productive cough, coughing up blood, chest pain, irregular heartbeats, acid heartburn, indigestion, loss of appetite, weight change, abdominal pain, difficulty swallowing, sore throat, tooth/dental problems, headaches, nasal congestion/difficulty breathing through nose, sneezing, itching, ear ache, anxiety, depression, hand/feet swelling, joint stiffness or pain, rash, change in color of mucus, and fever.    Vital Signs:  Patient profile:   77 year old male Height:      69 inches Weight:      182 pounds BMI:     26.97 O2 Sat:      95 % on Room air Temp:     97.8 degrees F oral Pulse rate:   68 / minute BP sitting:   120 / 70  (right arm) Cuff size:   large  Vitals Entered By: Gweneth Dimitri RN (July 14, 2010 10:29 AM)  O2 Flow:  Room air CC: 2 wk follow up.  c/o nonprod cough and hoarseness x 4 days.  Denies difficutly breathing, wheezing, chest tightness.  Comments Medications reviewed with patient Daytime  contact number verified with patient. Gweneth Dimitri RN  July 14, 2010 10:29 AM    Physical Exam  Additional Exam:  Gen: WD WN    WM     in NAD    NCAT Heent:  no jvd, no TMG, no cervical LNademopathy, orophyx clear,  nares with purulent drainage and erythema Cor: RRR nl s1/s2  no s3/s4  no m r h g Abd: soft NT BSA   no masses  No HSM  no rebound or guarding Ext perfused with no c v e v.d Neuro: intact, moves all 4s, CN II-XII intact, DTRs intact Chest: distant BS  no wheezes, rales, rhonchi   no egophony  no consolidative breath sounds, mild hyperresonance to percussion, exp wheezer Skin: clear  Genital/Rectal :deferred    Impression & Recommendations:  Problem # 1:  OTHER ACUTE SINUSITIS (ICD-461.8) Assessment  Unchanged ongoing sinusitis and copd flare, upper airway instabilty aggrav by advair plan 7days augmentin cont flonase  depomedrol 120mg  IM hold advair for a period of time until upper airway heals His updated medication list for this problem includes:    Fluticasone Propionate 50 Mcg/act Susp (Fluticasone propionate) .Marland Kitchen..Marland Kitchen Two sprays each nostril daily    Amoxicillin-pot Clavulanate 875-125 Mg Tabs (Amoxicillin-pot clavulanate) ..... One by mouth two times a day    Mucinex Dm 30-600 Mg Xr12h-tab (Dextromethorphan-guaifenesin) .Marland Kitchen..Marland Kitchen Two twice a day for 7days then reduce to as needed  Orders: Est. Patient Level IV (81191) Depo- Medrol 80mg  (J1040) Depo- Medrol 40mg  (J1030) Admin of Therapeutic Inj  intramuscular or subcutaneous (47829)  Medications Added to Medication List This Visit: 1)  Advair Diskus 250-50 Mcg/dose Misc (Fluticasone-salmeterol) .... Hold for 7days then resume 2)  Fluticasone Propionate 50 Mcg/act Susp (Fluticasone propionate) .... Two sprays each nostril daily 3)  Amoxicillin-pot Clavulanate 875-125 Mg Tabs (Amoxicillin-pot clavulanate) .... One by mouth two times a day 4)  Mucinex Dm 30-600 Mg Xr12h-tab (Dextromethorphan-guaifenesin) .... Two  twice a day for 7days then reduce to as needed  Complete Medication List: 1)  Wellbutrin Sr 200 Mg Xr12h-tab (Bupropion hcl) .... Take 1 tablet by mouth two times a day 2)  Zocor 40 Mg Tabs (Simvastatin) .... Take 1 tab by mouth at bedtime 3)  Meclizine Hcl 25 Mg Tabs (Meclizine hcl) .... 3-4 tabs by mouth per day as needed 4)  Triamterene-hctz 75-50 Mg Tabs (Triamterene-hctz) .... 1/2 tab by mouth once daily 5)  Cardura 4 Mg Tabs (Doxazosin mesylate) .... Take 1 tablet by mouth two times a day 6)  Klor-con 20 Meq Pack (Potassium chloride) .... Take 1 tablet by mouth once a day 7)  Singulair 10 Mg Tabs (Montelukast sodium) .... Take 1 tablet by mouth once a day 8)  Requip 1 Mg Tabs (Ropinirole hcl) .... Take 1 tablet by mouth once a day 9)  Bayer Aspirin Ec Low Dose 81 Mg Tbec (Aspirin) .... Take 1 tablet by mouth once a day 10)  Cpap  .Marland Kitchen.. 13 cmh20 11)  Ranitidine Hcl 150 Mg Caps (Ranitidine hcl) .... Take 1 capsule by mouth two times a day 12)  Advair Diskus 250-50 Mcg/dose Misc (Fluticasone-salmeterol) .... Hold for 7days then resume 13)  Hydrocodone-acetaminophen 5-500 Mg Tabs (Hydrocodone-acetaminophen) .Marland Kitchen.. 1 tab by mouth four times a day as needed 14)  Fluticasone Propionate 50 Mcg/act Susp (Fluticasone propionate) .... Two sprays each nostril daily 15)  Valium 5 Mg Tabs (Diazepam) .... Take 1 tablet by mouth three times a day as needed 16)  Combivent 103-18 Mcg/act Aero (Ipratropium-albuterol) .... Inhale 2 puffs every 4 hours as needed for shortness of breath 17)  Amoxicillin-pot Clavulanate 875-125 Mg Tabs (Amoxicillin-pot clavulanate) .... One by mouth two times a day 18)  Mucinex Dm 30-600 Mg Xr12h-tab (Dextromethorphan-guaifenesin) .... Two twice a day for 7days then reduce to as needed  Patient Instructions: 1)  Stop Advair for 7 days then resume as before 2)  Use Combivent 2 puff 4 times a day while off Advair, then reduce to as needed when advair is resumed 3)  Augmentin  twice a day for 7days (generic sent) 4)  A depomedrol 120mg  Injection will be given 5)  Use Mucinex DM 600mg  two twice a day for 7 days then reduce to as needed 6)  Use nasal saline spray two sprays each nostril twice daily 7)  Return 1 month [place] Prescriptions: AMOXICILLIN-POT CLAVULANATE 875-125 MG  TABS (AMOXICILLIN-POT CLAVULANATE) one by mouth two times a day  #14 x 0   Entered and Authorized by:   Storm Frisk MD   Signed by:   Storm Frisk MD on 07/14/2010   Method used:   Electronically to        [facility] [phone]* (retail)       [address]       [place], [place]  [address]       Ph: [phone]       Fax: [phone]   RxID:   [id number]       Medication Administration  Injection # 1:    Medication: Depo- Medrol 80mg     Diagnosis: OTHER ACUTE SINUSITIS (ICD-461.8)    Route: IM    Site: LUOQ gluteus    Exp Date: 05/2012    Lot #: [id number]    Mfr: Pharmacia    Patient tolerated injection without complications    Given by: Gweneth Dimitri RN (July 14, 2010 10:56 AM)  Injection # 2:    Medication: Depo- Medrol 40mg     Diagnosis: OTHER ACUTE SINUSITIS (ICD-461.8)    Route: IM    Site: LUOQ gluteus    Exp Date: 05/2012    Lot #: [id number]    Mfr: Pharmacia    Patient tolerated injection without complications    Given by: Gweneth Dimitri RN (July 14, 2010 10:56 AM)  Orders Added: 1)  Est. Patient Level IV [15176] 2)  Depo- Medrol 80mg  [J1040] 3)  Depo- Medrol 40mg  [J1030] 4)  Admin of Therapeutic Inj  intramuscular or subcutaneous [96372]   Appended Document: Pulmonary OV fax Theressa Millard

## 2010-08-18 NOTE — Assessment & Plan Note (Signed)
Summary: Pulmonary OV   Primary Micheal Frank/Referring Micheal Frank:  Dr. Theressa Millard  CC:  Acute Visit.  Chest congeston, chest tightness, and prod cough with yellow mucus - onset yesterday.  Denies increased SOB and f/c/s.Micheal Frank  History of Present Illness:  Pulmonary OV        This is an 77 years old male with Golds III COPD primary emphysema DLCO 69% FeV1 94% 10/09 This pt has been with [facility] MD in the past.  Dx COPD for 20 yrs.  Last pfts 11/08 at [facility]: DLCO 58%.   also OSA on CPAP   Dec 09, 2009 10:42 AM Patient stable symptoms. There is no cough or mucus production is noted. Patient is using CPAP nightly. Pt denies any significant sore throat, nasal congestion or excess secretions, fever, chills, sweats, unintended weight loss, pleurtic or exertional chest pain, orthopnea PND, or leg swelling Pt denies any increase in rescue therapy over baseline, denies waking up needing it or having any early am or nocturnal exacerbations of coughing/wheezing/or dyspnea.   April 21, 2010 11:27 AM copd f/u.  No real changes.  Is dyspneic with exertion.  Worse climbing stairs.  No chest pain. No real cough.   Pt denies any significant sore throat, nasal congestion or excess secretions, fever, chills, sweats, unintended weight loss, pleurtic or exertional chest pain, orthopnea PND, or leg swelling Pt denies any increase in rescue therapy over baseline, denies waking up needing it or having any early am or nocturnal exacerbations of coughing/wheezing/or dyspnea. Still on cpap at night, June 24, 2010 10:45 AM Onset 24hrs of congestion, then overnite severe coughing fits and chest tight.  Mucus is yellow.  No chest pain.  Notes no wheeze.  No f/c/s Notes more dyspnea.   No chest pain. Had skin cancers removed from the face .  Current Medications (verified): 1)  Wellbutrin Sr 200 Mg Xr12h-Tab (Bupropion Hcl) .... Take 1 Tablet By Mouth Two Times A Day 2)  Zocor 40 Mg Tabs (Simvastatin) .... Take  1 Tab By Mouth At Bedtime 3)  Meclizine Hcl 25 Mg Tabs (Meclizine Hcl) .... 3-4 Tabs By Mouth Per Day As Needed 4)  Triamterene-Hctz 75-50 Mg Tabs (Triamterene-Hctz) .... 1/2 Tab By Mouth Once Daily 5)  Cardura 4 Mg Tabs (Doxazosin Mesylate) .... Take 1 Tablet By Mouth Two Times A Day 6)  Klor-Con 20 Meq Pack (Potassium Chloride) .... Take 1 Tablet By Mouth Once A Day 7)  Singulair 10 Mg Tabs (Montelukast Sodium) .... Take 1 Tablet By Mouth Once A Day 8)  Requip 1 Mg Tabs (Ropinirole Hcl) .... Take 1 Tablet By Mouth Once A Day 9)  Bayer Aspirin Ec Low Dose 81 Mg Tbec (Aspirin) .... Take 1 Tablet By Mouth Once A Day 10)  Cpap .Micheal Frank.. 13 Cmh20 11)  Ranitidine Hcl 150 Mg Caps (Ranitidine Hcl) .... Take 1 Capsule By Mouth Two Times A Day 12)  Advair Diskus 250-50 Mcg/dose  Misc (Fluticasone-Salmeterol) .... One Puff Twice Daily 13)  Hydrocodone-Acetaminophen 5-500 Mg Tabs (Hydrocodone-Acetaminophen) .Micheal Frank.. 1 Tab By Mouth Four Times A Day As Needed 14)  Fluticasone Propionate 50 Mcg/act Susp (Fluticasone Propionate) .... Two Sprays Each Nostril Daily As Needed 15)  Valium 5 Mg Tabs (Diazepam) .... Take 1 Tablet By Mouth Three Times A Day As Needed 16)  Combivent 103-18 Mcg/act Aero (Ipratropium-Albuterol) .... Inhale 2 Puffs Every 4 Hours As Needed For Shortness of Breath  Allergies (verified): No Known Drug Allergies  Past History:  Past medical, surgical, family and social histories (including risk factors) reviewed, and no changes noted (except as noted below).  Past Medical History: Reviewed history from 06/04/2008 and no changes required. RESTLESS LEG SYNDROME (ICD-333.94) GLAUCOMA, BORDERLINE (ICD-365.00) MENIERE'S DISEASE (ICD-386.00) HISTORY OF ASBESTOS EXPOSURE (ICD-V15.84) DIZZINESS (ICD-780.4) HEARING LOSS (ICD-389.9) CARCINOMA IN SITU, SKIN (ICD-232.9) NEUROPATHY (ICD-355.9) OSTEOPOROSIS (ICD-733.00) ARTHRITIS (ICD-716.90) SLEEP APNEA (ICD-780.57) COPD (ICD-496)    -11/08: DLCO  58%  FeV1 85%  TLC 107%  RV 152%    -10/09  DLCO 69%   FeV1  94%      TLC133%   RV 146% ASTHMA (ICD-493.90)     Past Surgical History: Reviewed history from 04/30/2008 and no changes required. skin cancer surgery L TKR times two Ear surgery R  menieres dz  Family History: Reviewed history from 04/30/2008 and no changes required. father had emphysema, allergies, asthma  Social History: Reviewed history from 04/21/2010 and no changes required. 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  3ppd x 30 yrs  Review of Systems       The patient complains of shortness of breath with activity, productive cough, non-productive cough, nasal congestion/difficulty breathing through nose, and change in color of mucus.  The patient denies shortness of breath at rest, coughing up blood, chest pain, irregular heartbeats, acid heartburn, indigestion, loss of appetite, weight change, abdominal pain, difficulty swallowing, sore throat, tooth/dental problems, headaches, sneezing, itching, ear ache, anxiety, depression, hand/feet swelling, joint stiffness or pain, rash, and fever.    Vital Signs:  Patient profile:   77 year old male Height:      69 inches Weight:      180.38 pounds BMI:     26.73 O2 Sat:      97 % on Room air Temp:     98.2 degrees F oral Pulse rate:   88 / minute BP sitting:   136 / 82  (left arm) Cuff size:   regular  Vitals Entered By: Gweneth Dimitri RN (June 24, 2010 10:41 AM)  O2 Flow:  Room air CC: Acute Visit.  Chest congeston, chest tightness, prod cough with yellow mucus - onset yesterday.  Denies increased SOB and f/c/s. Comments Medications reviewed with patient Daytime contact number verified with patient. Gweneth Dimitri RN  June 24, 2010 10:41 AM    Physical Exam  Additional Exam:  Gen: WD WN    WM     in NAD    NCAT Heent:  no jvd, no TMG, no cervical LNademopathy, orophyx clear,  nares with purulent drainage and  erythema Cor: RRR nl s1/s2  no s3/s4  no m r h g Abd: soft NT BSA   no masses  No HSM  no rebound or guarding Ext perfused with no c v e v.d Neuro: intact, moves all 4s, CN II-XII intact, DTRs intact Chest: distant BS  no wheezes, rales, rhonchi   no egophony  no consolidative breath sounds, mild hyperresonance to percussion, exp wheezer Skin: clear  Genital/Rectal :deferred    CXR  Procedure date:  06/24/2010  Findings:      IMPRESSION: COPD.  No active lung disease.    Impression & Recommendations:  Problem # 1:  ACUTE BRONCHITIS (ICD-466.0) Assessment Deteriorated acute tracheobronchitis with flare, note CXR shows NAD plan Avelox one daily x 5days Prednisone 10mg  4 each am x3days, 3 x 3days, 2 x 3days, 1 x 3days then stop No other medication changes Mucinex DM two twice a  day for 7days His updated medication list for this problem includes:    Singulair 10 Mg Tabs (Montelukast sodium) .Micheal Frank... Take 1 tablet by mouth once a day    Advair Diskus 250-50 Mcg/dose Misc (Fluticasone-salmeterol) ..... One puff twice daily    Combivent 103-18 Mcg/act Aero (Ipratropium-albuterol) ..... Inhale 2 puffs every 4 hours as needed for shortness of breath    Avelox 400 Mg Tabs (Moxifloxacin hcl) ..... By mouth daily  Orders: Est. Patient Level IV (21308) T-2 View CXR (71020TC)  Medications Added to Medication List This Visit: 1)  Meclizine Hcl 25 Mg Tabs (Meclizine hcl) .... 3-4 tabs by mouth per day as needed 2)  Avelox 400 Mg Tabs (Moxifloxacin hcl) .... By mouth daily 3)  Prednisone 10 Mg Tabs (Prednisone) .... Take as directed 4 each am x3days, 3 x 3days, 2 x 3days, 1 x 3days then stop  Complete Medication List: 1)  Wellbutrin Sr 200 Mg Xr12h-tab (Bupropion hcl) .... Take 1 tablet by mouth two times a day 2)  Zocor 40 Mg Tabs (Simvastatin) .... Take 1 tab by mouth at bedtime 3)  Meclizine Hcl 25 Mg Tabs (Meclizine hcl) .... 3-4 tabs by mouth per day as needed 4)  Triamterene-hctz  75-50 Mg Tabs (Triamterene-hctz) .... 1/2 tab by mouth once daily 5)  Cardura 4 Mg Tabs (Doxazosin mesylate) .... Take 1 tablet by mouth two times a day 6)  Klor-con 20 Meq Pack (Potassium chloride) .... Take 1 tablet by mouth once a day 7)  Singulair 10 Mg Tabs (Montelukast sodium) .... Take 1 tablet by mouth once a day 8)  Requip 1 Mg Tabs (Ropinirole hcl) .... Take 1 tablet by mouth once a day 9)  Bayer Aspirin Ec Low Dose 81 Mg Tbec (Aspirin) .... Take 1 tablet by mouth once a day 10)  Cpap  .Micheal Frank.. 13 cmh20 11)  Ranitidine Hcl 150 Mg Caps (Ranitidine hcl) .... Take 1 capsule by mouth two times a day 12)  Advair Diskus 250-50 Mcg/dose Misc (Fluticasone-salmeterol) .... One puff twice daily 13)  Hydrocodone-acetaminophen 5-500 Mg Tabs (Hydrocodone-acetaminophen) .Micheal Frank.. 1 tab by mouth four times a day as needed 14)  Fluticasone Propionate 50 Mcg/act Susp (Fluticasone propionate) .... Two sprays each nostril daily as needed 15)  Valium 5 Mg Tabs (Diazepam) .... Take 1 tablet by mouth three times a day as needed 16)  Combivent 103-18 Mcg/act Aero (Ipratropium-albuterol) .... Inhale 2 puffs every 4 hours as needed for shortness of breath 17)  Avelox 400 Mg Tabs (Moxifloxacin hcl) .... By mouth daily 18)  Prednisone 10 Mg Tabs (Prednisone) .... Take as directed 4 each am x3days, 3 x 3days, 2 x 3days, 1 x 3days then stop  Patient Instructions: 1)  Avelox one daily x 5days 2)  Prednisone 10mg  4 each am x3days, 3 x 3days, 2 x 3days, 1 x 3days then stop 3)  No other medication changes 4)  Mucinex DM two twice a day for 7days 5)  A chest xray will be obtained today, will call with results 6)  Return two weeks [place] office for recheck Prescriptions: PREDNISONE 10 MG  TABS (PREDNISONE) Take as directed 4 each am x3days, 3 x 3days, 2 x 3days, 1 x 3days then stop  #30 x 0   Entered and Authorized by:   Storm Frisk MD   Signed by:   Storm Frisk MD on 06/24/2010   Method used:    [facility] to  [facility] [phone]* (retail)       [address]       [place], [place]  [address]       Ph: [phone]       Fax: [phone]   RxID:   [id number] AVELOX 400 MG  TABS (MOXIFLOXACIN HCL) By mouth daily  #5 x 0   Entered and Authorized by:   Storm Frisk MD   Signed by:   Storm Frisk MD on 06/24/2010   Method used:   Electronically to        [facility] [phone]* (retail)       [address]       [place], [place]  [address]       Ph: [phone]       Fax: [phone]   RxID:   [id number]     Appended Document: Pulmonary OV fax Theressa Millard

## 2010-10-18 ENCOUNTER — Other Ambulatory Visit: Payer: Self-pay | Admitting: Internal Medicine

## 2010-10-21 ENCOUNTER — Ambulatory Visit
Admission: RE | Admit: 2010-10-21 | Discharge: 2010-10-21 | Disposition: A | Discharge status: Home / Self Care | Payer: Medicare Other | Source: Ambulatory Visit | Attending: Internal Medicine | Admitting: Internal Medicine

## 2010-10-21 MED ORDER — IOHEXOL 300 MG/ML  SOLN
80.0000 mL | Freq: Once | INTRAMUSCULAR | Status: AC | PRN
  Administered 2010-10-21: 80 mL via INTRAVENOUS

## 2010-11-29 NOTE — Consult Note (Signed)
NAME:  Micheal Frank, Micheal Frank             ACCOUNT NO.:  [id number]   MEDICAL RECORD NO.:  [id number]          PATIENT TYPE:  INP   LOCATION:  5016                         FACILITY:  [facility]   PHYSICIAN:  [name], MDDATE OF BIRTH:  10/21/1925   DATE OF CONSULTATION:  01/03/2008  DATE OF DISCHARGE:                                 CONSULTATION   REQUESTING PHYSICIAN:  Burnard Bunting, MD.   REASON FOR CONSULTATION:  Hypoxia.   IMPRESSION AND RECOMMENDATIONS:  1. Hypoxia:  According to the patient and family this ischronic.  He      reports that he sees a pulmonologist at [facility] and that      his oxygen levels usually range between 85-90.  He was also      recently started on oxygen with his CPAP nightly.  Apparently, he      was told that he may eventually need oxygen 24 hours a day.  He      reports that his chronic shortness of breath and cough is unchanged      from previous.  He denies any chest pain.  However, I think it is      reasonable to check a PA and lateral chest x-ray to rule out      pneumonia or fluid overload.  2. Chronic obstructive pulmonary disease, stable:  Lungs are without      wheezes currently.  3. Obstructive sleep apnea on CPAP and oxygen nightly.  4. Diarrhea.  I have to recommended holding his laxatives for now.  5. Benign prostatic hypertrophy.  6. Peripheral neuropathy.  7. Meniere's disease.  8. Depression.  9. Gastroesophageal reflux disease.  10.Hyperlipidemia.  11.Hypokalemia yesterday:  I will order a basic metabolic panel for      tomorrow.   HISTORY OF PRESENT ILLNESS:  Mr. Zahradnik is a pleasant 77 year old white  male patient of Dr. Earl Gala, who was admitted after a left total knee  revision on December 31, 2007.  Since then, his oxygenation has been an  issue.  He drops into the mid 80s when his nasal cannula oxygen is  removed and maintains good oxygenation with nasal cannula oxygen.  He  denies any shortness of breath, orthopnea, or  chest pain.  No history of  pulmonary embolus and he has been on DVT prophylaxis.  He has had no  fevers or chills.   MEDICATIONS:  1. Wellbutrin SR 200 mg a day.  2. Zocor 20 mg a day.  3. Valium 5 mg t.i.d.  4. Meclizine 25 mg t.i.d.  5. Singulair 10 mg a day.  6. Advair 250/50 b.i.d.  7. Neurontin 200 mg nightly.  8. Pepcid 20 mg p.o. b.i.d.  9. Coumadin per pharmacy.  10.Colace twice a day.  11.Triamterene hydrochlorothiazide 75/50 a day.  12.Cardura 4 mg p.o. b.i.d.  13.Flomax 0.4 mg a day.  14.Milk of magnesia daily.  15.Combivent as needed.   SOCIAL HISTORY:  The patient is married.  He is here with his daughter  and wife.  He quit smoking years ago.  No history of heavy drinking.  Family history is noncontributory.   REVIEW OF SYSTEMS:  As above, otherwise negative.   PHYSICAL EXAMINATION:  Temperature is 98.1, pulse 70, respiratory rate  20, blood pressure 118/65.  Oxygen saturations are recorded as 92% on  room air, but the nurse reports that this is not correct.  In general, the patient is an elderly white male in no acute distress.  He is lying supine.  HEENT:  Normocephalic, atraumatic.  Pupils equal, round, reactive to  light.  Sclerae nonicteric.  Moist mucous membranes.  He has dentures.  NECK:  Supple.  No JVD.  No lymphadenopathy.  LUNGS:  Clear to auscultation bilaterally without wheezes, rhonchi, or  rales.  CARDIOVASCULAR:  Regular rate and rhythm without murmurs, gallops, or  rubs.  ABDOMEN:  Normal bowel sounds, soft, nontender, nondistended.  GU and RECTAL:  Deferred.  EXTREMITIES:  His left leg has 1+ pitting edema.  No calf tenderness.  No edema of the right leg.  NEUROLOGIC:  The patient is alert and oriented.  Cranial nerves and  sensorimotor exam are grossly intact.  PSYCHIATRIC:  The patient is calm and cooperative with normal affect.   LABORATORIES:  Today, his white count is 9.4, hemoglobin 12.4,  hematocrit 35, platelet count 147, and  INR is 1.8.  Yesterday, his basic  metabolic panel is significant for a potassium of 3.4, BUN of 20, and  creatinine 1.5.  EKG on December 26, 2007, showed sinus bradycardia with  right bundle branch block.  Chest x-ray on December 26, 2007, shows COPD.   If patient's oxygenation remains 88 or below on room air, he will need  home oxygen.  However, we will follow up results of chest x-ray and make  any further recommendations.   Thank you [name] for this consultation.      [name], MD  Electronically Signed     CLS/MEDQ  D:  01/03/2008  T:  01/04/2008  Job:  [id number]   cc:   Theressa Millard, M.D.  Burnard Bunting, M.D.

## 2010-11-29 NOTE — Op Note (Signed)
NAME:  Micheal Frank, Micheal Frank NO.:  [id number]   MEDICAL RECORD NO.:  [id number]          PATIENT TYPE:  INP   LOCATION:  5016                         FACILITY:  [facility]   PHYSICIAN:  Burnard Bunting, M.D.    DATE OF BIRTH:  08-28-1925   DATE OF PROCEDURE:  DATE OF DISCHARGE:                               OPERATIVE REPORT   PREOPERATIVE DIAGNOSIS:  Loose left tibia status post total knee  arthroplasty.   POSTOPERATIVE DIAGNOSIS:  Loose left tibia status post total knee  arthroplasty.   PROCEDURE:  Left TKA tibial revision.   SURGEON:  Burnard Bunting, M.D.   ASSISTANT:  Jerolyn Shin. Tresa Res, M.D.   ANESTHESIA:  General endotracheal.   ESTIMATED BLOOD LOSS:  150.   DRAINS:  None.   TOURNIQUET TIME:  Two hours at 300 mmHg.   INDICATIONS:  Micheal Frank is an 77 year old patient with loose left  TKA tibial component who presents now for revision after explanation of  risks and benefits.   PROCEDURE IN DETAIL:  The patient was brought to the operating room  where general endotracheal anesthesia was induced.  Preoperative  antibiotics were administered.  Left leg was pre-scrubbed with alcohol  and Betadine, allowed to air dry, and prepped with DuraPrep solution.  Micheal Frank was used to cover the  operative field.  Leg was elevated and  exsanguinated with an Esmarch, wrapped and tourniquet was inflated.  Midline approach to knee was utilized.  Skin and subcutaneous tissue  were sharply divided.  Median parapatellar approach was made to the  knee.  The patella was everted, synovectomy was performed.  Periosteal  elevation was performed off the medial aspect of the tibia because of  the patient's varus alignment.  The tibial component was grossly loose.  The polyethylene spacer was removed.  The tibia was then extracted.  Excess cement was removed.  At this time, the femur was inspected.  There was no looseness to the femoral component along the bone cement or  cement-prosthesis interface.  At this time, the tibial canal was hand  reamed.  With jig in place, a cut was made perpendicular to the  mechanical axis of the tibia.  Offset guide was then utilized and a tray  was pinned in position with a 4-mm offset required.  Trial components  were then placed.  Intraoperative radiographs demonstrated good  alignment.  At this time, the trial components were removed.  The knee  was thoroughly irrigated.  The knee was stable to varus and valgus  stress with a 21-mm spacer at 0, 30, and 90 degrees.  At this time, the  true stemmed component was cemented into position.  Long stem was  utilized.  Good press fit was achieved in the tibial canal.  Proximally,  the base plate was cemented.  The tibial plateau was drilled with pins  in order to facilitate in cement interdigitation.  Excess cement was  removed.  Knee was held in extension with the 21-mm spacer in place.  Full extension was achieved.  Tourniquet was released.  Bleeding points  encountered and closed using electrocautery.  Knee was then closed over  a bolster using interrupted 2-0 Vicryl suture and the staples.  Hemovac  drain was placed.  Solution of Marcaine, morphine, and clonidine was  injected to the knee.  Knee immobilizer was placed which was well  padded.  He tolerated the procedure well without immediate complication.  Dr. Lenny Pastel assistance was required all times during the case for  retraction of important neurovascular structures and limb mobilization.  His presence was a medical necessity.      Burnard Bunting, M.D.  Electronically Signed     GSD/MEDQ  D:  12/31/2007  T:  01/01/2008  Job:  [id number]

## 2010-12-02 NOTE — Cardiovascular Report (Signed)
[facility]  Patient:    Micheal Frank, Micheal Frank                    MRN: [id number] Proc. Date: 01/16/00 Adm. Date:  [id number] Attending:  Lyn Records. Iii CC:         James C. Earl Gala, M.D.                        Cardiac Catheterization  CINE NUMBER:  [id number]  INDICATIONS FOR PROCEDURE:  Recent jaw discomfort and abnormal Cardiolite study suggesting the possibility of inferior ischemia.  This study is being done to rule out coronary disease.  PROCEDURES PERFORMED: 1. Left heart catheterization. 2. Selective coronary angiography. 3. Left ventriculography.  DESCRIPTION OF PROCEDURE:  After informed consent, a 6 French sheath was inserted into the right femoral artery using a modified Seldinger technique. A 6 French A2 multipurpose catheter was used for hemodynamic recordings, left ventriculography, hand injection left ventriculography.  The patient tolerated the procedure without complications.  A sheathogram was performed on the right femoral artery, but the entry site precluded Perclose.  RESULTS:  I:  HEMODYNAMIC DATA:     a. The aortic pressure 140/75 mmHg.     b. Left ventricular pressure 140/23 mmHg.  II:  LEFT VENTRICULOGRAPHY:  The left ventricle is normal in size and demonstrates normal overall LV contractility.  No mitral regurgitation is noted.  EF is estimated to be 60%.  III:  SELECTIVE CORONARY ANGIOGRAPHY:     a. Left main:  The left main coronary artery is normal.     b. Left anterior descending coronary artery:  The left anterior        descending coronary artery is a large vessel.  It reaches the        left ventricular apex and wraps around the apex.  It gives origin        to a large first diagonal.  The LAD is free of any significant        obstruction.  There is mild proximal luminal irregularity.     c. Circumflex artery:  The circumflex artery is a large vessel that        trifurcates on the left lateral wall.   Minimal luminal irregularity is        noted.  No high-grade obstruction is seen.    d. Right coronary artery:  The right coronary artery is a large vessel       given origin to a large acute right ventricular marginal branch and       several distal left lateral ventricular branches.  No significant       obstruction is seen.  CONCLUSIONS: 1. Normal left ventricular function. 2. Essentially normal coronary arteries for age. 3. False-positive Cardiolite.  RECOMMENDATIONS:  No further cardiac evaluation is indicated. DD:  01/16/00 TD:  01/16/00 Job: [id number]

## 2010-12-02 NOTE — Op Note (Signed)
NAME:  OBRYAN, RADU NO.:  [id number]   MEDICAL RECORD NO.:  [id number]                   PATIENT TYPE:  INP   LOCATION:  2550                                 FACILITY:  [facility]   PHYSICIAN:  Kerrin Champagne, M.D.                DATE OF BIRTH:  Apr 07, 1926   DATE OF PROCEDURE:  02/24/2002  DATE OF DISCHARGE:                                 OPERATIVE REPORT   PREOPERATIVE DIAGNOSES:  Severe osteoarthritis, left medial knee joint line  and patellofemoral osteoarthritis.   POSTOPERATIVE DIAGNOSES:  Severe osteoarthritis, left medial knee joint line  and patellofemoral osteoarthritis.   OPERATION PERFORMED:  Left cemented Osteonics Scorpio total knee  arthroplasty with #9 femoral and #9 tibial components and 18 mm tibial tray.  Tibial bearing insert tray was posteriorly stabilized Scorpio Flex component  18 mm in width.  A 28 mm patellar component cemented in place.   SURGEON:  Kerrin Champagne, M.D.   ASSISTANT:  French Ana A. Shuford, P.A.-C.   ANESTHESIA:  General orotracheal anesthesia.  Dr. Gypsy Balsam.  Epidural catheter  placed at the end of the procedure.   DRAINS:  Foley catheter on straight drain, Hemovac times one.   ESTIMATED BLOOD LOSS:  200 cc.   TOURNIQUET TIME:  300 mmHg one hour 45 minutes.   INDICATIONS FOR PROCEDURE:  The patient is a 77 year old male seen in our  office and evaluated for combination of lumbar degenerative disk disease as  well as knee pain.  He was found to have significant osteoarthritis changes,  spondylolisthesis at the L5-S1 level.  Bridging osteophytes without  significant stenosis.  He complains of severe left knee pain, clinically has  extension lagging about 10 degrees on the left.  Flexion to about 125  degrees.  Varus deformity of the left knee with grating of the medial joint  line patellofemoral joint.   The patient underwent attempts at conservative management, steroid  injection, anti-inflammatory agents  which were unremarkable in relief of  pain.  Due to deformity present, severe osteoarthritis changes and night  pain he was judged to be an excellent candidate for a left total knee  arthroplasty.   INTRAOPERATIVE FINDINGS:  The patient was found to have severe  osteoarthritis changes along the left knee medial joint line with bone on  bone erosive changes occurring into the medial tibial plateau.  The lateral  joint line was fairly well maintained.  Steroid material and hypertrophic  synovium found within the suprapatellar pouch along the medial joint line.  Almost no cartilage remaining along the medial joint surface.  The  patellofemoral joint demonstrated moderately severe osteoarthritis changes.   DESCRIPTION OF PROCEDURE:  After adequate general anesthesia, left lower  extremity prepped from the toes to the upper thigh with DuraPrep solution.  Tourniquet about the left upper thigh.  Draped in usual manner.  Iodine Vi-  drape was used and preoperative antibiotics.  Left lower extremity  exsanguinated.  Tourniquet inflated to 300 mmHg.  Incision made in the  midline extending along the medial aspect in the anterior tibial tubercle  distally and proximally to just about 2 cm above the superior pole of the  patella.  Incision through the skin and subcu layers approximately 14 to 15  cm in length.  This was carried down to the external retinaculum of the knee  and this was incised in line with the skin incision and then carried  medially and developed lateral as well for reapproximation of the subcu  layers.  The incision was then made in the superomedial pole of the patella  into the knee joint through the medial one third of the quadriceps  attachment to patella.  Then this was carried preserving a flap medially for  reattachment of the medial retinaculum of the knee.  Then carried along the  medial aspect of the patellar tendon distally.  The incision carried sharply  into the  quadriceps tendon, proximally along the medial one third.  The  synovial lining also incised.  Immediately fluid identified and suctioned.  Patella then everted and the incision carried sharply through the medial fat  layer down to the medial aspect of the proximal tibia and subperiosteal  dissection carried laterally first using electrocautery and then Cobb  elevators and then large curved Krego to carefully free up and to free the  medial attachment of the distal portion of the medial sleeve of the knee  including a medial collateral ligament. Loosing soft tissue structures  allowed for opening of the medial joint line for the remainder of the case.   The medial meniscus was resected anteriorly and laterally.  Fat debrided  from the posterior aspect of the patella tendon to allow for visualization  of the lateral compartment.  The lateral meniscus was also debrided  anteriorly using electrocautery.  The anterior cruciate ligament was incised  off of the lateral portion of the femoral condyle off the anterior aspect of  the tibial tubercle medially.  The posterior cruciate ligament then resected  as best as possible.  Knee flexed, the patella everted.  The soft tissue  over the anterior aspect of the distal femur just above the condyles was  then carefully freed off this supracondylar portion of the knee and then  Cobb used to elevate this proximally to insert feeler gauges during the  procedure.  The first drill hole then made with the anterior aspect of the  intercondylar notch in the distal femur in line with the expected femoral  channel.  The canal finder used to elevate this further.  The distal femoral  cutting jig was then placed in external alignment in line with the patient's  medial and lateral epicondyle in approximately 3 to 4 degrees external  rotation.  This was then carefully pinned to the distal femur.  The intramedullary guide was removed.  An additional 2 mm was resected  off the  distal femur  removing the guide superiorly an addition 2 mm.  The guide was  then pinned into place and a transverse distal femoral cut was performed.   Note the osteophytes both medial and lateral were resected off the femoral  condyles to allow for proper placement of the intramedullary guide and also  proper placement of the distal femoral __________ .   The end of the femur was then sized for the appropriate size implant.  A #9  implant was chosen and using  a feeler gauge, placed laterally, resecting  some of the condyle anteriorly in order to allow for placement of the  cutting guide demonstrating that it was placed against the anterior femur  distally and felt that it would not cause significant notching.  This was  then used to allow for pinning the end of the femur for continued placement  of the jigs for the cutting of the chamfer and coronal cuts for the distal  femur.  This jig, a #9 that was then placed on the end of the femur impacted  into place, impacted down.  The posterior chamfer cuts were performed  protecting the posterior structures using the narrow Kragos.  This was done  both medial and lateral.  Care was taken to ensure that no residual  osteophytes remained and the anterior femoral cut was performed.  This was  done without difficulty.  No notching was noted of the femur anteriorly.  The posterior chamfer cut then anterior chamfer cuts were performed without  difficulty.  Then the cutting guide was removed.   Carefully, the posterior aspect of the knee joint was then exposed and  hypertrophic changes within the posterior aspect of the knee joint were  resected including the synovial lining where it was found to be  hypertrophic.  The remaining portions of the posterior cruciate ligament  were resected completely.   The lateral patellar ligament was resected to allow for the patella to move  medially with the new patellar component later.  Attention  was turned to the  distal tibia.  The proximal aspect of the tibia was then carefully exposed  using a McCale retractors over the posterior aspect of the proximal tibia,  retracting it anteriorly subluxing the joint anteriorly.  The sharper  McCales were placed laterally.  An oscillating saw was then used to debride  the tibial eminence superiorly smooth.  A trial of #7 and then #9 was chosen  for the proximal portion of the tibia after resecting the osteophytes  anteriorly and medially.  This was then used to place the initial drill into  the proximal femoral channel.  This was widened using the step cut reamer.  The canal finder then inserted and then a medium tibial intramedullary guide  was used for placement of the proximal cutting jig.  This aligned with the  tibia using an alignment guide rod, rod bisecting the ankle joint distally.  With this then, the cutting jig was pinned to the proximal tibia.  An additional __________  6 mm were taken off of the medial aspect of the tibia  where there was severe osteoarthritis changes.  An additional 2 mm were  taken as well.  The jig so set was then used to cut the proximal tibia  without difficulty protecting the soft tissue structures posterior using a  Cobb elevator.  Bone along the edges debrided using Matt Holmes rongeurs  appropriate length.   Popliteal tendon left intact.  Care was taken to ensure cauterization  of  the genicular vessels and the perforating posterior geniculate vessels where  possible.  Once this cut was then made, a trial reduction performed after  cutting the intercondylar notch for the distal femur.  This done using the  cutting jig applied to the anterior distal portion of the femur, first  impacted into place and then pinned in using the cutting guide first the  osteotome had passed from posterior to anterior and then from anterior to  posterior, cutting the channel over  the anterior portion of the femur.  Then  the  cutting guide used to cut into the notch the distal portion of the  femur.  This was done and then bone removed.  It was accessed from the  distal portion of the femur, the mid intercondylar region.  The impactor  used to impact the channel for the distal femur notch.  Once this was  completed, then a trial reduction was performed impacting the trial Scorpio  femoral component into place.  A #9 tibial tray going from 12 to 15 then to  18 mm which provided the most amount of stability medial and lateral allowed  for full extension of the knee with slight hyperflexion and then flexion of  almost over 140 degrees without liftoff.  The 5 degree cut was performed for  this.  With this, then the patella component, the patella was prepared on  its ventral aspect for accepting the patellar component.  Clamp applied,  then a 28 mm trial demonstrated this would provide adequate coverage of the  patella surface with 2 or 3 mm of bone stock medial, lateral, superior and  inferior.  The clamp was then applied, clicked into place slightly  medialized.  Then 28 mm reamer applied.  Depth of 10 mm was chosen and  reaming was performed to 10.  Excess osteochondral material was then  resected off the posterior aspect of the patella and then the drill guide  placed.   Three drill holes then placed without difficulty.  The planer and the insert  for the planer was then placed in the region of the expected patellar  component and planing performed over the retro aspect of the patella,  planing this area smooth.  Finally the knee was then subluxed.  The femoral  component resected and removed.  The McCale was reinserted, blunt over the  posterior aspect  of the tibia, sharp laterally.  Then placing the proximal  tibial tray in the correct position, alignment and pinning it to the  proximal portion of the tibia, the guide for the flanges was then placed and the flange osteotomes were each impacted.  Three total  were used up to a #9  cement __________ .  Once this was completed, then irrigation was performed  using pulse irrigant lavage system while cement was being mixed and  permanent prosthesis brought onto the field.  #9 femoral component and #9  tibial tray.  And a 28 mm patellar component.  Following irrigation then,  irrigation was performed completely.  All soft tissue was carefully  debrided.  Cement in mixed state was then applied to the proximal portion of  the tibia and also within the flange hole proximally.  The proximal tibial  component in correct alignment due to marks that were placed over the  previous trial reduction was then impacted into place and excess cement  removed from the posterior aspect of the tibial component anteriorly as  well.  After removing the inserting device, additional impaction was  performed and then additional cement removed medially and lateral, posterior  and anterior.  The cement was then placed over the distal portion of the  femur and anterior femur over the posterior runners in the implant.  The  implant for the distal femur was then impacted into place and provided  excellent fixation, excess cement removed both medial and lateral.  Care  taken to examine the posterior aspect of the femoral component.  There did  not appear  to be any significant residual cement retained here.  The  patellar component was then carefully cemented into place. Cement placed  within the retro aspect of the patella after marking areas for the holes  using electrocautery circumferentially.  The patellar button then impressed  into place and then using a screw down setting device, this was screwed into  place.  Excess cement removed.  The knee was then placed into extension  after placing a trial implant using a 15 mm implant.  With the knee in full  extension, the cement was allowed to fully harden.  It took approximately 15  minutes for the cement to fully harden.   Tourniquet was then released.  Bleeders were controlled following removal of the insert __________ vessel  laterally was identified and cauterized using electrocautery.   The lateral aspect of the distal femur showed some continued bone bleeding  as was there bleeding from the intercondylar region as expected.  There was  adequate hemostasis present.  A single medium Hemovac drain was placed in  the depth of the incision exiting out anterolaterally.   An 18 mm insert was cut to 15 mm insert and still it provided some medial  lateral movement.  There was also recurvatum that appeared to be access.  Irrigation was performed.  Examination of the knee retro aspect of the  tibia, posterior aspect of the femur demonstrated no significant residual  bone remaining.  Irrigation was performed.  Careful inspection demonstrated  no further active bleeding evident.  18 mm Scorpio flexed cruciate sacrificing tibial bearing insert was then inserted and this was posterior  stabilized.  This was then impacted into place.  Care was taken to ensure  that it was seated properly both posteriorly and anteriorly, medial and  lateral.  Irrigation again was performed.  Knee placed through a range of  motion shows full extension with slight recurvatum.  Full flexion almost to  140 degrees with the patient's patella demonstrating normal tracking without  any evidence of concern regarding seating in the tibial tray component.  The  synovial lining was then approximated in running stitch of 0 Vicryl sutures.  The retinaculum of the knee approximated with interrupted #1 Vicryl sutures,  deep subcutaneous layers approximated with interrupted 0 Vicryl sutures  including the external retinaculum of the knee.  The more superficial layers  of the subcu were approximated with interrupted 2-0 Vicryl suture and the  skin closed with a running suture of 2-0 Monocryl.  Tincture of benzoin and  Steri-Strips applied.  The patient  then had application of a well padded  dressing using 4 x 4s, ABD pads, Kerlix and Ace wrap for the entire leg.  Knee immobilizer was applied.   TOURNIQUET TIME:  One hour and 45 minutes.   The patient then was placed  in lateral decubitus position and an epidural  catheter placed.  This will be controlled by anesthesia postoperatively.  The patient was then reactivated and returned to recovery room in  satisfactory condition.  All instrument and sponge counts were correct.                                                 Kerrin Champagne, M.D.    JEN/MEDQ  D:  02/24/2002  T:  02/26/2002  Job:  [id number]

## 2010-12-02 NOTE — Op Note (Signed)
   NAME:  Micheal Frank, Micheal Frank NO.:  [id number]   MEDICAL RECORD NO.:  [id number]                   PATIENT TYPE:  INP   LOCATION:  2550                                 FACILITY:  [facility]   PHYSICIAN:  Bedelia Person, M.D.                     DATE OF BIRTH:  [birth year]   DATE OF PROCEDURE:  02/24/2002  DATE OF DISCHARGE:                                 OPERATIVE REPORT   PROCEDURE:  Epidural catheter placement for postoperative fentanyl pain  control.   CLINICAL NOTE:  The patient is a 77 year old male scheduled to undergo a  total knee replacement for osteoarthritis of the knee.  In consultation with  Dr. Otelia Sergeant, it is recommended that the patient have postoperative epidural  for pain control.  After discussions with the patient, the patient elected  to proceed with the technique knowing the risks of backache, headache,  nausea and vomiting, itching from the medication, as well as possible nerve  damage with paralysis or allergic reaction to the anesthetic agent.   DESCRIPTION OF PROCEDURE:  After the surgical dressing was applied, the  patient was turned to the full left lateral decubitus position, Betadine  prep x3 was performed on the lumbar region.  Sterile technique was used, and  a 17-gauge needle was inserted in the 3-4 interspace.  Using a midline  approach, the epidural space was located on the second pass.  There was no  paresthesia detected.  A nonstyletted catheter was then inserted 6 cm into  the epidural space, and the needle was removed.  There was a negative  aspiration of blood and CSF.  A mixture of preservative-free Xylocaine 1% 5  cc, 0.9% normal saline 10 cc, and 100 mcg of fentanyl was then slowly  injected into the epidural catheter.  The catheter was secured to the  patient's back using four-inch Hypafix dressing with a sterile 2 x 2 gauze  over the insertion site.  The patient was then returned to the supine  position, awoken, extubated,  and taken to the recovery room in good  condition.  There he was placed on continuous infusion of 5 mcg/cc epidural  fentanyl plus 1/16% Marcaine at an initial rate of 10 cc/hr.  He will be  evaluated for the next two days for adequacy of pain control, at which time  due to anticoagulant therapy the catheter will be removed.  Again, there  were no complications of the procedure.                                               Bedelia Person, M.D.    LK/MEDQ  D:  02/24/2002  T:  02/27/2002  Job:  [id number]

## 2010-12-02 NOTE — H&P (Signed)
NAME:  Micheal Frank, Micheal Frank NO.:  [id number]   MEDICAL RECORD NO.:  [id number]                   PATIENT TYPE:  INP   LOCATION:  2550                                 FACILITY:  [facility]   PHYSICIAN:  Lucita Lora. Shuford, P.A.-C.           DATE OF BIRTH:  [birth year]   DATE OF ADMISSION:  02/24/2002  DATE OF DISCHARGE:                                HISTORY & PHYSICAL   CHIEF COMPLAINT:  Left knee pain. His surgery is for February 24, 2002.   HISTORY OF PRESENT ILLNESS:  Micheal Frank is a very pleasant, 77 year old male  who has had progressive and end stage osteoarthritis involving his left  knee. He also has been seen and treated for degenerative disk disease with  spondylolisthesis in his lumbar spine. He has had epidural steroids,  however, his biggest complaint at this time continues to be his knee. He is  having significant pain with any activities. There is swelling and he is  unable to tolerate NSAIDS due to some recent azotemia on Vioxx. With all  this, and he has failed corticosteroid injections as well as knee  arthroscopy, he was felt to require a total knee arthroplasty. Risks and  benefits discussed with the patient. He is in agreement. He did receive  medical clearance from his physician, Dr. Theressa Millard, to proceed.   PAST MEDICAL HISTORY:  1. Mild COPD.  2. Meniere's disease.  3. Hypercholesterolemia.  4. Depression.  5. Recent azotemia result.  6. GERD.  7. BPH.   PAST SURGICAL HISTORY:  1. Right ear shunt for Meniere's disease in [facility]  2. Bilateral knee arthroscopies by Dr. Lajean Silvius.  3. Hernia repair.  4. Removal of melanoma x 2.   MEDICATIONS:  1. Wellbutrin SR 200 mg q.d.  2. Lipitor 20 mg q.d.  3. Meclizine 25 mg t.i.d. to q.i.d. p.r.n.  4. Triamterene hydrochlorothiazide 75/50 mg q.d.  5. Prilosec 20 mg q.d.  6. Flomax 0.8 mg q.d.  7. Hydrocodone p.r.n.  8. Atrovent 3 puffs q.i.d.  9. Tylenol Arthritis p.r.n.  10.      Valium 5 mg b.i.d.  11.      Aspirin a day.  12.      Glucosamine/Chondroitin daily.   ALLERGIES:  OYSTERS.   SOCIAL HISTORY:  He lives in a 3 story home, however, he can be on 1 level  with minimal steps into usual entrance. His wife will be there  postoperatively to help him and he has a remote history of tobacco and  alcohol use. He quit approximately 20 years ago on both.   FAMILY HISTORY:  Negative.   REVIEW OF SYSTEMS:  The patient denies any recent fevers, chills, night  sweats. No bleeding tendencies. CNS - No blurred vision or paralysis.  RESPIRATORY: No shortness of breath, productive cough, hemoptysis.  CARDIOVASCULAR: No chest pain or angina. He had a recent negative cardiac  catheterization.  GI: No nausea, vomiting, constipation, melena, bloody  stools. GENITOURINARY: Occasional urinary frequency and urgency related to  BPH.  MUSCULOSKELETAL: As per History of Present Illness.   PHYSICAL EXAMINATION:  Well-developed, well-nourished, 77 year old male,  walks with antalgic gait, blood pressure 132/95.  HEENT: Normocephalic, atraumatic. Extraocular movements intact.  NECK: Supple. No lymphadenopathy.  CHEST: Clear to auscultation bilaterally. No rales or rhonchi.  HEART: Regular rate and rhythm. No murmurs, gallops, rubs, heaves, thrills.  ABDOMEN: Positive bowel sounds, soft. Nontender.  EXTREMITIES: On the left, he has positive mild effusion and significant  crepitance on range of motion with pain along the medial joint line. Both  calves are soft, nontender and good pulses.   IMPRESSION:  1. Osteoarthritis left knee.  2. Mild chronic obstructive pulmonary disease .  3. Hypocholesterolemia.  4. Meniere's disease.  5. Depression.  6. Recent azotemia, resolved.  7. Benign prostatic hypertrophy.  8. Gastroesophageal reflux disease.   PLAN:  Left total knee arthroplasty. We will have anesthesia evaluate for  spinal and possible epidural postoperatively  versus general anesthesia. Dr.  Theressa Millard is his primary physician should any needs arise.                                                Tracy A. Shuford, P.A.-C.    TAS/MEDQ  D:  02/20/2002  T:  02/24/2002  Job:  [id number]   cc:   Winn Jock. Earl Gala, M.D.

## 2010-12-02 NOTE — Op Note (Signed)
NAME:  ADILSON, GRAFTON NO.:  [id number]   MEDICAL RECORD NO.:  [id number]          PATIENT TYPE:  AMB   LOCATION:  ENDO                         FACILITY:  [facility]   PHYSICIAN:  Danise Edge, M.D.   DATE OF BIRTH:  June 05, 1926   DATE OF PROCEDURE:  04/18/2004  DATE OF DISCHARGE:                                 OPERATIVE REPORT   PROCEDURE:  Incomplete colonoscopy.   INDICATIONS FOR PROCEDURE:  Mr. Hudsyn Champine is a 77 year old male born  [birth year].  Mr. Rossa is scheduled to undergo his first screening  colonoscopy with polypectomy to prevent colon cancer.   ENDOSCOPIST:  Danise Edge, M.D.   PREMEDICATION:  Versed 5 mg, Demerol 50 mg.   PROCEDURE:  After obtaining informed consent, Mr. Carattini was placed in the  left lateral decubitus position.  I administered intravenous Demerol and  intravenous Versed to achieve conscious sedation for the procedure.  The  patient's blood pressure, oxygen saturation, and cardiac rhythm were  monitored throughout the procedure and documented in the medical record.   Anal inspection and digital rectal exam was normal.  I did not examine the  prostate.  The Olympus adjustable pediatric colonoscope was introduced into  the rectum and advanced to the proximal ascending colon.  Despite patient  repositioning and the application of external abdominal pressure, I was  unable to intubate the cecum.   Mr. Samford has extensive universal colonic diverticulosis.  His diverticula  are filled with solid stool.  The presence of stool-filled diverticula  compromised the screening exam.   RECTUM:  Normal.   SIGMOID COLON/DESCENDING COLON:  Normal.   SPLENIC FLEXURE:  Normal.   TRANSVERSE COLON:  Normal.   HEPATIC FLEXURE:  Normal.   ASCENDING COLON:  Normal.   CECUM AND ILEOCECAL VALVE:  Not examined.   ASSESSMENT:  Extensive universal colonic diverticulosis.  The cecum could  not be intubated with the endoscope and was  not examined.  No endoscopic  evidence for the presence of colorectal neoplasia with the endoscope  advanced to the proximal small ascending colon.      MJ/MEDQ  D:  04/18/2004  T:  04/18/2004  Job:  [id number]   cc:   Theressa Millard, M.D.  [address] [place]  [place] [address]  Fax: [phone]

## 2010-12-02 NOTE — Discharge Summary (Signed)
NAME:  Micheal Frank, Micheal Frank NO.:  [id number]   MEDICAL RECORD NO.:  [id number]          PATIENT TYPE:  INP   LOCATION:  5016                         FACILITY:  [facility]   PHYSICIAN:  Burnard Bunting, M.D.    DATE OF BIRTH:  [birth year]   DATE OF ADMISSION:  12/31/2007  DATE OF DISCHARGE:  01/05/2008                               DISCHARGE SUMMARY   ADMISSION DIAGNOSES:  1. Loose left tibia, component of left total knee arthroplasty.  2. Benign prostatic hypertrophy.  3. History of peptic ulcer disease.  4. History of melanoma.  5. Peripheral neuropathy.  6. Asthma.  7. Sleep apnea.  8. Chronic obstructive pulmonary disease.  9. Osteoporosis.  10.Gastroesophageal reflux disease.  11.Meniere disease.   DISCHARGE DIAGNOSES:  1. Loose left tibia, component of left total knee arthroplasty.  2. Benign prostatic hypertrophy.  3. History of peptic ulcer disease.  4. History of melanoma.  5. Peripheral neuropathy.  6. Asthma.  7. Sleep apnea.  8. Chronic obstructive pulmonary disease.  9. Osteoporosis.  10.Gastroesophageal reflux disease.  11.Meniere disease.  12.Acute hypoxia related to atelectasis, resolved at discharge.  13.Posthemorrhagic anemia.  14.Hypokalemia.   PROCEDURE:  On December 31, 2007, the patient underwent revision of left  total knee arthroplasty, tibial component.  This was performed by Dr.  August Frank, assisted by Dr. Tresa Res under general anesthesia.   CONSULTATIONS:  [name], MD   BRIEF HISTORY:  The patient is an 77 year old male who is status post  left total knee arthroplasty.  His initial procedure was performed in  Micheal 2003.  He has had recurrence of discomfort in the left lower  extremity with edema and difficulty weightbearing.  Radiographs have  indicated loosening of the tibial component of the left total knee  arthroplasty.  It was felt he would benefit from revision of this and  was admitted for the procedure as stated  above.   BRIEF HOSPITAL COURSE:  The patient tolerated the procedure under  general anesthesia without complications.  Postoperatively,  neurovascular and motor function of the lower extremities was noted to  be intact.  The patient's Hemovac drain was discontinued on the first  postoperative day.  Dressing changes were done daily thereafter with  wound healing without signs of infection.  The patient was started on  physical therapy for ambulation and gait training.  He was allowed  weightbearing as tolerated on the operative extremity.  CPM was utilized  for range of motion and active range of motion and stretching exercises  were taught as well.  Initially, the patient was somewhat slow to  progress with physical therapy and it was felt he might need a skilled  nursing facility for rehabilitation.  However, through the latter part  of his hospital stay, he was able to be increase significantly with his  activity level in independence ambulating as much as 70 feet with a  rolling walker.  He did have family available to assist him at home and  they did desired that he be discharged to home.  His CPM was at 0-55  degrees on discharge.  The patient was placed on Coumadin for DVT  prophylaxis.  Adjustments in Coumadin dose made according to daily  Protimes by the pharmacist at [facility].  INR at discharge was noted to be  2.2.  The patient had posthemorrhagic anemia postoperatively with the  lowest value being 12.1.  At discharge, hemoglobin 12.8, hematocrit  37.2, and he did not require a transfusion.  Chemistry studies were  within normal limits with exception of potassium at 3.4 on January 02, 2008.  GFR was noted to be as low as 45 and calcium 8.2.  Urinalysis on  admission negative for urinary tract infection.  Repeat urinalysis after  discontinuation of his Foley catheter on January 03, 2008 had negative  findings with the exception of trace hemoglobin.  A urine culture was  sent and showed  50,000 colonies of Enterobacter aerogenes and Proteus  mirabilis.  The patient was able to void independently following removal  of the Foley catheter.  He did develop episode of hypoxia.  He has known  COPD as well as sleep apnea and CPAP machine was utilized during the  hospital stay.  Chest x-ray indicated no active disease.  He was felt to  have atelectasis which was treated with incentive spirometry.  Coughing  and deep breathing was instructed as well.  Eventually on January 06, 2008,  he was felt stable for discharge to his home.  Arrangements were made  for him to be discharged with his family and to have home health  physical therapy available.   CONDITION ON DISCHARGE:  Stable.   PLAN:  The patient will continue to be weightbearing as tolerated on the  operative extremity.  CPM machine will be utilized at home.  He should  also work on stretching and strengthening and active range of motion  exercises which will be taught by the physical therapist.  [name] will  assist with home health physical therapy as well as Coumadin management.  He will keep his incision dry and clean.  The patient is advised to see  Dr. August Frank in 1 week from discharge.   DISCHARGE MEDICATIONS:  1. Coumadin 5 mg daily.  2. Vicodin ES one every 6 hours as needed for pain.  3. Robaxin 500 mg one every 8 hours as needed for spasm.   He will resume home medications as taken prior to admission.  He has  been advised to call the office should he have fevers sustained at  greater than 101 or pain not relieved with his medications.  The patient  was given medication reconciliation form with instructions in regards to  his home medications.  All questions encouraged and answered.   CONDITION ON DISCHARGE:  Stable.      [name], P.A.      Burnard Bunting, M.D.  Electronically Signed    SMV/MEDQ  D:  02/13/2008  T:  02/14/2008  Job:  [id number]

## 2010-12-02 NOTE — Procedures (Signed)
NAME:  Micheal Frank, Micheal Frank NO.:  [id number]   MEDICAL RECORD NO.:  [id number]          PATIENT TYPE:  OUT   LOCATION:  SLEEP CENTER                 [facility]ILITY[facility]  [facility]   PHYSICIAN:  Clinton D. Maple Hudson, M.D. DATE OF BIRTH:  06-Dec-1925   DATE OF ADMISSION:  02/16/2004  DATE OF DISCHARGE:  02/16/2004                              NOCTURNAL POLYSOMNOGRAM   REFERRING PHYSICIAN:  Theressa Millard, M.D.   INDICATION FOR STUDY/HISTORY:  Hypersomnia with sleep apnea.  Complaints of  waking fatigue, excessive daytime somnolence.   Epworth sleepiness score 20/24, neck size 16 inches, BMI 27, weight 183  pounds.   MEDICATIONS:  Listed medications:  1. Wellbutrin.  2. Lipitor.  3. Valium.  4. Meclizine.  5. Fosamax.  6. Atrovent.  7. Hydrocodone.  8. Prilosec.  9. Etodolac.  10.      Glucosamine.  11.      Aspirin.   SLEEP ARCHITECTURE:  Total sleep time 341 minutes for a sleep efficiency of  78%.  Stage 1 was 17%, stage 2 was 62 %, stages 3 and 4 were absent, within  normal for age.  REM was 28% of total sleep time.  Latency to sleep onset  was 19 minutes.  Latency to REM was 86 minutes.  Wake after sleep onset was  69 minutes.  Arousal index 19 times per hour.  Frequent arousals persisted  throughout the night and were not exclusively associated with respiratory  events.   RESPIRATORY DATA:  Split-study protocol.  RDI (respiratory disturbance  index) 24/h indicating moderately severe obstructive sleep apnea/hypopnea  before CPAP titration.  This included 29 obstructive apneas, 1 central  apnea, and 25 hypopneas before CPAP.  He slept mostly on his back.  REM RDI  was 23/h.  CPAP titration was completed to 13 CWP, RDI of zero per hour,  utilizing a large Respironics comfort-gel nasal/oral mask with humidifier.  Toleration of CPAP was fair.  Persistent snoring was noted until the highest  pressure.   OXYGEN DATA:  Moderate oxygen desaturation to 76% briefly during initial  evaluation before CPAP.  Snoring was described as mild to moderate.  Oxygenation was held over 90% on CPAP.   CARDIAC DATA:  Normal cardiac rhythm with occasional PVC.   MOVEMENT/PARASOMNIA:  274 body jerks were recorded of which 21 were  associated with sleep disturbance or a periodic limb movement with an  arousal index of 3.7 per hour indicating mild periodic limb movement  syndrome.   IMPRESSION/RECOMMENDATION:  Moderately severe obstructive sleep  apnea/hypopnea syndrome, RDI 24/h with desaturation to 76% during snoring.  CPAP control at 13 CWP (RDI zero/h) using a large Respironics comfort-gel  nasal/oral mask with heated humidifier.  Periodic limb movement with  arousal.  Note that the patient awoke at 5:30 a.m. complaining of shoulder  pain.                                   ______________________________  Clinton D. Maple Hudson, M.D.                                Diplomate, American Board of Sleep Medicine    CDY/MEDQ  D:  02/21/2004 10:21:45  T:  02/22/2004 13:42:10  Job:  [id number]

## 2010-12-02 NOTE — Discharge Summary (Signed)
NAME:  Micheal Frank, NASCA NO.:  [id number]   MEDICAL RECORD NO.:  [id number]                   PATIENT TYPE:  INP   LOCATION:  5004                                 FACILITY:  [facility]   PHYSICIAN:  Dooley L. Shela Nevin, P.A.       DATE OF BIRTH:  06-Dec-1925   DATE OF ADMISSION:  02/24/2002  DATE OF DISCHARGE:  03/01/2002                                 DISCHARGE SUMMARY   ADMITTING DIAGNOSES:  1. Osteoarthritis of the left knee.  2. Mild chronic obstructive pulmonary disease.  3. Hypercholesterolemia.  4. Meniere's disease.  5. Depression.  6. Recent azotemia, resolved.  7. Benign prostatic hypertrophy.  8. Gastroesophageal reflux disease.   DISCHARGE DIAGNOSES:  1. Osteoarthritis of the left knee.  2. Mild chronic obstructive pulmonary disease.  3. Hypercholesterolemia.  4. Meniere's disease.  5. Depression.  6. Recent azotemia, resolved.  7. Benign prostatic hypertrophy.  8. Gastroesophageal reflux disease.  9. Mild postoperative anemia.   OPERATION:  1. On February 24, 2002, the patient underwent left cemented [employer] [place]     total knee replacement arthroplasty, [place] Ana A. Shuford, P.A.-C assisted.  2. Epidural catheter placement for postoperative fentanyl pain control by     Dr. Bedelia Person, February 24, 2002.   CONSULTANTS:  None.   BRIEF HISTORY:  This 77 year old male with progressive end-stage  osteoarthritis of left knee, is having significant pain with activities.  Knee scopes as well as corticosteroids injections in knee really have not  helped him to this point and it was felt he would benefit from the above  procedure and is admitted for same.   COURSE IN THE HOSPITAL:  The patient tolerated the surgical procedure quite  well.  Fentanyl epidural was used for his immediate postop pain and followed  by anesthesia.  Once the fentanyl was discontinued, we increased the  patient's level of activity with weightbearing as tolerated  on the left  lower extremity according to total knee protocol.  Dr. Ranelle Oyster saw  the patient from [facility], felt he was doing so well  that an inpatient rehab would probably not be helpful.  The patient  progressed with physical therapy, achieving desired results with left knee  rehabilitation and range of motion.  Neurovascular remained intact in the  left lower extremity and the wound was dry the day before discharge.  It was  felt he could be maintained in his home environment.  He will have [facility] as well as D[employer] [facility] [employer] for him to continue with rehab  at home.  Once these arrangements were made and his family was able to  receive him, which will be on March 01, 2002, he will be discharged.  Should there be any change in his discharge status, we will of course hold  the patient's discharge.   LABORATORY AND ACCESSORY CLINICAL DATA:  Laboratory values in the hospital  hematologically showed  a preoperative CBC completely within normal limits;  hemoglobin was 15.6, hematocrit was 45.6.  Final hemoglobin was 10.4.  Hematocrit was 30.6.  Blood chemistries were normal and urinalysis negative  for urinary tract infection.   Chest x-ray showed emphysema and no acute finding.   Electrocardiogram with sinus bradycardia, otherwise, normal ECG.   CONDITION ON DISCHARGE:  Improved and stable.   PLAN:  The patient is discharged to his home in the care of his family with  home health by [place].  Return to see Dr. Kerrin Champagne approximately two  weeks after the date of surgery.  Using dry dressing to the knee p.r.n. and  continue with range of motion.  He is to follow with Dr. Winn Jock. Earl Gala  concerning any other medical problems that he may have or medical questions.  I told him to call our office if he had any questions about his left knee.   DISCHARGE MEDICATIONS:  1. He will be continued on Coumadin protocol which was initiated  directly     postop under the direction of [facility] team.  2. Robaxin 500 mg, #30, one q.6h. p.r.n. muscle spasm.  3. Tylox, #40, one to two q.4h. p.r.n. pain.                                               Dooley L. Shela Nevin, P.A.    DLU/MEDQ  D:  02/28/2002  T:  03/04/2002  Job:  [id number]   cc:   Winn Jock. Earl Gala, M.D.

## 2010-12-04 DIAGNOSIS — M86169 Other acute osteomyelitis, unspecified tibia and fibula: Secondary | ICD-10-CM | POA: Insufficient documentation

## 2010-12-04 DIAGNOSIS — G061 Intraspinal abscess and granuloma: Secondary | ICD-10-CM | POA: Insufficient documentation

## 2010-12-13 ENCOUNTER — Observation Stay (HOSPITAL_COMMUNITY): Payer: Medicare Other

## 2010-12-13 ENCOUNTER — Inpatient Hospital Stay (HOSPITAL_COMMUNITY)
Admission: AD | Admit: 2010-12-13 | Discharge: 2011-01-04 | DRG: 028 | Disposition: A | Discharge status: Skilled Nursing Facility | Payer: Medicare Other | Source: Ambulatory Visit | Attending: Internal Medicine | Admitting: Internal Medicine

## 2010-12-13 ENCOUNTER — Ambulatory Visit: Payer: Medicare Other | Admitting: Adult Health

## 2010-12-13 DIAGNOSIS — G061 Intraspinal abscess and granuloma: Principal | ICD-10-CM | POA: Diagnosis present

## 2010-12-13 DIAGNOSIS — G4733 Obstructive sleep apnea (adult) (pediatric): Secondary | ICD-10-CM | POA: Diagnosis present

## 2010-12-13 DIAGNOSIS — N401 Enlarged prostate with lower urinary tract symptoms: Secondary | ICD-10-CM | POA: Diagnosis present

## 2010-12-13 DIAGNOSIS — N138 Other obstructive and reflux uropathy: Secondary | ICD-10-CM | POA: Diagnosis present

## 2010-12-13 DIAGNOSIS — E46 Unspecified protein-calorie malnutrition: Secondary | ICD-10-CM | POA: Diagnosis present

## 2010-12-13 DIAGNOSIS — E7800 Pure hypercholesterolemia, unspecified: Secondary | ICD-10-CM | POA: Diagnosis present

## 2010-12-13 DIAGNOSIS — A4101 Sepsis due to Methicillin susceptible Staphylococcus aureus: Secondary | ICD-10-CM | POA: Diagnosis present

## 2010-12-13 DIAGNOSIS — Z96659 Presence of unspecified artificial knee joint: Secondary | ICD-10-CM

## 2010-12-13 DIAGNOSIS — L89109 Pressure ulcer of unspecified part of back, unspecified stage: Secondary | ICD-10-CM | POA: Diagnosis not present

## 2010-12-13 DIAGNOSIS — L8990 Pressure ulcer of unspecified site, unspecified stage: Secondary | ICD-10-CM | POA: Diagnosis not present

## 2010-12-13 DIAGNOSIS — T8450XA Infection and inflammatory reaction due to unspecified internal joint prosthesis, initial encounter: Secondary | ICD-10-CM | POA: Diagnosis present

## 2010-12-13 DIAGNOSIS — A419 Sepsis, unspecified organism: Secondary | ICD-10-CM | POA: Diagnosis present

## 2010-12-13 DIAGNOSIS — R339 Retention of urine, unspecified: Secondary | ICD-10-CM | POA: Diagnosis present

## 2010-12-13 DIAGNOSIS — A0472 Enterocolitis due to Clostridium difficile, not specified as recurrent: Secondary | ICD-10-CM | POA: Diagnosis not present

## 2010-12-13 LAB — COMPREHENSIVE METABOLIC PANEL
ALT: 12 U/L (ref 0–53)
AST: 27 U/L (ref 0–37)
Albumin: 2.5 g/dL — ABNORMAL LOW (ref 3.5–5.2)
Alkaline Phosphatase: 189 U/L — ABNORMAL HIGH (ref 39–117)
BUN: 54 mg/dL — ABNORMAL HIGH (ref 6–23)
CO2: 29 mEq/L (ref 19–32)
Calcium: 9.1 mg/dL (ref 8.4–10.5)
Chloride: 96 mEq/L (ref 96–112)
Creatinine, Ser: 1.83 mg/dL — ABNORMAL HIGH (ref 0.4–1.5)
GFR calc Af Amer: 43 mL/min — ABNORMAL LOW (ref >60)
GFR calc non Af Amer: 36 mL/min — ABNORMAL LOW (ref >60)
Glucose, Bld: 101 mg/dL — ABNORMAL HIGH (ref 70–99)
Potassium: 3.6 mEq/L (ref 3.5–5.1)
Sodium: 138 mEq/L (ref 135–145)
Total Bilirubin: 0.8 mg/dL (ref 0.3–1.2)
Total Protein: 6.5 g/dL (ref 6.0–8.3)

## 2010-12-13 LAB — URINALYSIS, ROUTINE W REFLEX MICROSCOPIC
Glucose, UA: NEGATIVE mg/dL
Hgb urine dipstick: NEGATIVE
Ketones, ur: NEGATIVE mg/dL
Leukocytes, UA: NEGATIVE
Nitrite: NEGATIVE
Protein, ur: 30 mg/dL — AB
Specific Gravity, Urine: 1.026 (ref 1.005–1.030)
Urobilinogen, UA: 1 mg/dL (ref 0.0–1.0)
pH: 5.5 (ref 5.0–8.0)

## 2010-12-13 LAB — CBC
HCT: 41.6 % (ref 39.0–52.0)
Hemoglobin: 14.5 g/dL (ref 13.0–17.0)
MCH: 31.5 pg (ref 26.0–34.0)
MCHC: 34.9 g/dL (ref 30.0–36.0)
MCV: 90.4 fL (ref 78.0–100.0)
Platelets: 280 10*3/uL (ref 150–400)
RBC: 4.6 MIL/uL (ref 4.22–5.81)
RDW: 14.7 % (ref 11.5–15.5)
WBC: 24.7 10*3/uL — ABNORMAL HIGH (ref 4.0–10.5)

## 2010-12-13 LAB — URINE MICROSCOPIC-ADD ON

## 2010-12-13 LAB — SEDIMENTATION RATE: Sed Rate: 52 mm/hr — ABNORMAL HIGH (ref 0–16)

## 2010-12-14 ENCOUNTER — Observation Stay (HOSPITAL_COMMUNITY): Payer: Medicare Other

## 2010-12-14 ENCOUNTER — Inpatient Hospital Stay (HOSPITAL_COMMUNITY): Payer: Medicare Other

## 2010-12-14 DIAGNOSIS — T8450XA Infection and inflammatory reaction due to unspecified internal joint prosthesis, initial encounter: Secondary | ICD-10-CM

## 2010-12-14 LAB — CBC
HCT: 36.7 % — ABNORMAL LOW (ref 39.0–52.0)
Hemoglobin: 13.3 g/dL (ref 13.0–17.0)
MCH: 32 pg (ref 26.0–34.0)
MCHC: 36.2 g/dL — ABNORMAL HIGH (ref 30.0–36.0)
MCV: 88.4 fL (ref 78.0–100.0)
Platelets: 329 10*3/uL (ref 150–400)
RBC: 4.15 MIL/uL — ABNORMAL LOW (ref 4.22–5.81)
RDW: 14.5 % (ref 11.5–15.5)
WBC: 22.7 10*3/uL — ABNORMAL HIGH (ref 4.0–10.5)

## 2010-12-14 LAB — URINE CULTURE
Colony Count: NO GROWTH
Culture  Setup Time: 201205291746
Culture: NO GROWTH

## 2010-12-14 LAB — GRAM STAIN

## 2010-12-14 LAB — BASIC METABOLIC PANEL
BUN: 36 mg/dL — ABNORMAL HIGH (ref 6–23)
CO2: 28 mEq/L (ref 19–32)
Calcium: 8.4 mg/dL (ref 8.4–10.5)
Chloride: 100 mEq/L (ref 96–112)
Creatinine, Ser: 1.39 mg/dL (ref 0.4–1.5)
GFR calc Af Amer: 59 mL/min — ABNORMAL LOW (ref >60)
GFR calc non Af Amer: 49 mL/min — ABNORMAL LOW (ref >60)
Glucose, Bld: 150 mg/dL — ABNORMAL HIGH (ref 70–99)
Potassium: 3.2 mEq/L — ABNORMAL LOW (ref 3.5–5.1)
Sodium: 137 mEq/L (ref 135–145)

## 2010-12-14 LAB — SYNOVIAL CELL COUNT + DIFF, W/ CRYSTALS
Crystals, Fluid: NONE SEEN
Eosinophils-Synovial: 0 % (ref 0–1)
Lymphocytes-Synovial Fld: 0 % (ref 0–20)
Monocyte-Macrophage-Synovial Fluid: 3 % — ABNORMAL LOW (ref 50–90)
Neutrophil, Synovial: 97 % — ABNORMAL HIGH (ref 0–25)
WBC, Synovial: 183500 /mm3 — ABNORMAL HIGH (ref 0–200)

## 2010-12-14 LAB — PROTIME-INR
INR: 1.17 (ref 0.00–1.49)
Prothrombin Time: 15.1 seconds (ref 11.6–15.2)

## 2010-12-14 MED ORDER — GADOBENATE DIMEGLUMINE 529 MG/ML IV SOLN
20.0000 mL | Freq: Once | INTRAVENOUS | Status: AC
  Administered 2010-12-14: 20 mL via INTRAVENOUS

## 2010-12-15 ENCOUNTER — Inpatient Hospital Stay (HOSPITAL_COMMUNITY): Payer: Medicare Other

## 2010-12-15 DIAGNOSIS — T8450XA Infection and inflammatory reaction due to unspecified internal joint prosthesis, initial encounter: Secondary | ICD-10-CM

## 2010-12-15 LAB — BASIC METABOLIC PANEL
CO2: 27 mEq/L (ref 19–32)
Calcium: 8 mg/dL — ABNORMAL LOW (ref 8.4–10.5)
Creatinine, Ser: 1.2 mg/dL (ref 0.4–1.5)
Glucose, Bld: 96 mg/dL (ref 70–99)

## 2010-12-15 LAB — CBC
HCT: 35.5 % — ABNORMAL LOW (ref 39.0–52.0)
Hemoglobin: 12.4 g/dL — ABNORMAL LOW (ref 13.0–17.0)
MCH: 31.4 pg (ref 26.0–34.0)
MCHC: 34.9 g/dL (ref 30.0–36.0)
MCV: 89.9 fL (ref 78.0–100.0)

## 2010-12-15 LAB — DIFFERENTIAL
Eosinophils Relative: 0 % (ref 0–5)
Monocytes Relative: 9 % (ref 3–12)
Neutro Abs: 21.5 10*3/uL — ABNORMAL HIGH (ref 1.7–7.7)

## 2010-12-16 HISTORY — PX: OTHER SURGICAL HISTORY: SHX169

## 2010-12-16 LAB — BASIC METABOLIC PANEL
BUN: 22 mg/dL (ref 6–23)
CO2: 27 mEq/L (ref 19–32)
Calcium: 7.9 mg/dL — ABNORMAL LOW (ref 8.4–10.5)
Chloride: 104 mEq/L (ref 96–112)
Creatinine, Ser: 1.04 mg/dL (ref 0.4–1.5)
GFR calc Af Amer: >60 mL/min (ref >60)
GFR calc non Af Amer: >60 mL/min (ref >60)
Glucose, Bld: 93 mg/dL (ref 70–99)
Potassium: 4 mEq/L (ref 3.5–5.1)
Sodium: 137 mEq/L (ref 135–145)

## 2010-12-16 LAB — BODY FLUID CULTURE

## 2010-12-16 LAB — CULTURE, BLOOD (ROUTINE X 2)
Culture  Setup Time: 201205300251
Culture  Setup Time: 201205300251

## 2010-12-16 LAB — CBC
HCT: 32.7 % — ABNORMAL LOW (ref 39.0–52.0)
Hemoglobin: 11.3 g/dL — ABNORMAL LOW (ref 13.0–17.0)
MCH: 30.9 pg (ref 26.0–34.0)
MCHC: 34.6 g/dL (ref 30.0–36.0)
MCV: 89.3 fL (ref 78.0–100.0)
Platelets: 381 10*3/uL (ref 150–400)
RBC: 3.66 MIL/uL — ABNORMAL LOW (ref 4.22–5.81)
RDW: 14.9 % (ref 11.5–15.5)
WBC: 24.6 10*3/uL — ABNORMAL HIGH (ref 4.0–10.5)

## 2010-12-17 LAB — CULTURE, ROUTINE-ABSCESS: Gram Stain: NONE SEEN

## 2010-12-17 LAB — CBC
HCT: 30.8 % — ABNORMAL LOW (ref 39.0–52.0)
Hemoglobin: 10.6 g/dL — ABNORMAL LOW (ref 13.0–17.0)
MCH: 30.9 pg (ref 26.0–34.0)
MCHC: 34.4 g/dL (ref 30.0–36.0)
MCV: 89.8 fL (ref 78.0–100.0)
Platelets: 514 10*3/uL — ABNORMAL HIGH (ref 150–400)
RBC: 3.43 MIL/uL — ABNORMAL LOW (ref 4.22–5.81)
RDW: 15 % (ref 11.5–15.5)
WBC: 32.3 10*3/uL — ABNORMAL HIGH (ref 4.0–10.5)

## 2010-12-17 LAB — BASIC METABOLIC PANEL
BUN: 24 mg/dL — ABNORMAL HIGH (ref 6–23)
CO2: 25 mEq/L (ref 19–32)
Calcium: 8.2 mg/dL — ABNORMAL LOW (ref 8.4–10.5)
Chloride: 100 mEq/L (ref 96–112)
Creatinine, Ser: 1.12 mg/dL (ref 0.4–1.5)
GFR calc Af Amer: >60 mL/min (ref >60)
GFR calc non Af Amer: >60 mL/min (ref >60)
Glucose, Bld: 60 mg/dL — ABNORMAL LOW (ref 70–99)
Potassium: 4.4 mEq/L (ref 3.5–5.1)
Sodium: 135 mEq/L (ref 135–145)

## 2010-12-18 ENCOUNTER — Inpatient Hospital Stay (HOSPITAL_COMMUNITY): Payer: Medicare Other

## 2010-12-18 LAB — CBC
HCT: 30.8 % — ABNORMAL LOW (ref 39.0–52.0)
Hemoglobin: 10.7 g/dL — ABNORMAL LOW (ref 13.0–17.0)
MCV: 89.3 fL (ref 78.0–100.0)
Platelets: 567 10*3/uL — ABNORMAL HIGH (ref 150–400)
RBC: 3.45 MIL/uL — ABNORMAL LOW (ref 4.22–5.81)
WBC: 33.8 10*3/uL — ABNORMAL HIGH (ref 4.0–10.5)

## 2010-12-19 ENCOUNTER — Inpatient Hospital Stay (HOSPITAL_COMMUNITY): Payer: Medicare Other

## 2010-12-19 LAB — ANAEROBIC CULTURE: Gram Stain: NONE SEEN

## 2010-12-19 LAB — CBC
MCV: 89 fL (ref 78.0–100.0)
Platelets: 572 10*3/uL — ABNORMAL HIGH (ref 150–400)
RBC: 3.26 MIL/uL — ABNORMAL LOW (ref 4.22–5.81)
WBC: 45.5 10*3/uL — ABNORMAL HIGH (ref 4.0–10.5)

## 2010-12-19 LAB — BASIC METABOLIC PANEL
BUN: 39 mg/dL — ABNORMAL HIGH (ref 6–23)
Chloride: 100 mEq/L (ref 96–112)
Potassium: 3.8 mEq/L (ref 3.5–5.1)

## 2010-12-19 MED ORDER — IOHEXOL 300 MG/ML  SOLN
100.0000 mL | Freq: Once | INTRAMUSCULAR | Status: AC | PRN
  Administered 2010-12-19: 100 mL via INTRAVENOUS

## 2010-12-20 LAB — COMPREHENSIVE METABOLIC PANEL
ALT: 5 U/L (ref 0–53)
AST: 51 U/L — ABNORMAL HIGH (ref 0–37)
Albumin: 1.6 g/dL — ABNORMAL LOW (ref 3.5–5.2)
Alkaline Phosphatase: 112 U/L (ref 39–117)
BUN: 39 mg/dL — ABNORMAL HIGH (ref 6–23)
CO2: 22 mEq/L (ref 19–32)
Calcium: 9.3 mg/dL (ref 8.4–10.5)
Chloride: 104 mEq/L (ref 96–112)
Creatinine, Ser: 1.3 mg/dL (ref 0.4–1.5)
GFR calc Af Amer: >60 mL/min (ref >60)
GFR calc non Af Amer: 53 mL/min — ABNORMAL LOW (ref >60)
Glucose, Bld: 82 mg/dL (ref 70–99)
Potassium: 3.5 mEq/L (ref 3.5–5.1)
Sodium: 138 mEq/L (ref 135–145)
Total Bilirubin: 1.8 mg/dL — ABNORMAL HIGH (ref 0.3–1.2)
Total Protein: 5 g/dL — ABNORMAL LOW (ref 6.0–8.3)

## 2010-12-20 LAB — CBC
HCT: 26.2 % — ABNORMAL LOW (ref 39.0–52.0)
Hemoglobin: 9.1 g/dL — ABNORMAL LOW (ref 13.0–17.0)
MCH: 31.1 pg (ref 26.0–34.0)
MCHC: 34.7 g/dL (ref 30.0–36.0)
MCV: 89.4 fL (ref 78.0–100.0)
Platelets: 519 10*3/uL — ABNORMAL HIGH (ref 150–400)
RBC: 2.93 MIL/uL — ABNORMAL LOW (ref 4.22–5.81)
RDW: 14.8 % (ref 11.5–15.5)
WBC: 33.8 10*3/uL — ABNORMAL HIGH (ref 4.0–10.5)

## 2010-12-20 LAB — ANAEROBIC CULTURE

## 2010-12-21 ENCOUNTER — Inpatient Hospital Stay (HOSPITAL_COMMUNITY): Payer: Medicare Other

## 2010-12-21 DIAGNOSIS — T8450XA Infection and inflammatory reaction due to unspecified internal joint prosthesis, initial encounter: Secondary | ICD-10-CM

## 2010-12-21 LAB — CULTURE, RESPIRATORY

## 2010-12-21 LAB — CBC
HCT: 24.2 % — ABNORMAL LOW (ref 39.0–52.0)
Hemoglobin: 8.5 g/dL — ABNORMAL LOW (ref 13.0–17.0)
MCH: 31.6 pg (ref 26.0–34.0)
MCHC: 35.1 g/dL (ref 30.0–36.0)
MCV: 90 fL (ref 78.0–100.0)
Platelets: 493 10*3/uL — ABNORMAL HIGH (ref 150–400)
RBC: 2.69 MIL/uL — ABNORMAL LOW (ref 4.22–5.81)
RDW: 14.9 % (ref 11.5–15.5)
WBC: 29.2 10*3/uL — ABNORMAL HIGH (ref 4.0–10.5)

## 2010-12-21 MED ORDER — GADOBENATE DIMEGLUMINE 529 MG/ML IV SOLN
20.0000 mL | Freq: Once | INTRAVENOUS | Status: AC
  Administered 2010-12-21: 20 mL via INTRAVENOUS

## 2010-12-22 LAB — BASIC METABOLIC PANEL
BUN: 34 mg/dL — ABNORMAL HIGH (ref 6–23)
CO2: 23 mEq/L (ref 19–32)
Calcium: 8.3 mg/dL — ABNORMAL LOW (ref 8.4–10.5)
Chloride: 108 mEq/L (ref 96–112)
Creatinine, Ser: 1.23 mg/dL (ref 0.4–1.5)
GFR calc Af Amer: >60 mL/min (ref >60)
GFR calc non Af Amer: 56 mL/min — ABNORMAL LOW (ref >60)
Glucose, Bld: 84 mg/dL (ref 70–99)
Potassium: 3.2 mEq/L — ABNORMAL LOW (ref 3.5–5.1)
Sodium: 141 mEq/L (ref 135–145)

## 2010-12-22 LAB — CBC
HCT: 22.5 % — ABNORMAL LOW (ref 39.0–52.0)
Hemoglobin: 7.9 g/dL — ABNORMAL LOW (ref 13.0–17.0)
MCH: 31.7 pg (ref 26.0–34.0)
MCHC: 35.1 g/dL (ref 30.0–36.0)
MCV: 90.4 fL (ref 78.0–100.0)
Platelets: 474 10*3/uL — ABNORMAL HIGH (ref 150–400)
RBC: 2.49 MIL/uL — ABNORMAL LOW (ref 4.22–5.81)
RDW: 14.8 % (ref 11.5–15.5)
WBC: 20.6 10*3/uL — ABNORMAL HIGH (ref 4.0–10.5)

## 2010-12-22 LAB — CLOSTRIDIUM DIFFICILE BY PCR

## 2010-12-23 ENCOUNTER — Inpatient Hospital Stay (HOSPITAL_COMMUNITY): Payer: Medicare Other

## 2010-12-23 ENCOUNTER — Other Ambulatory Visit: Payer: Self-pay | Admitting: Neurosurgery

## 2010-12-23 LAB — CBC
HCT: 24.5 % — ABNORMAL LOW (ref 39.0–52.0)
Hemoglobin: 8.5 g/dL — ABNORMAL LOW (ref 13.0–17.0)
MCH: 31.1 pg (ref 26.0–34.0)
MCHC: 34.7 g/dL (ref 30.0–36.0)
MCV: 89.7 fL (ref 78.0–100.0)
Platelets: 386 10*3/uL (ref 150–400)
RBC: 2.73 MIL/uL — ABNORMAL LOW (ref 4.22–5.81)
RDW: 15 % (ref 11.5–15.5)
WBC: 20.8 10*3/uL — ABNORMAL HIGH (ref 4.0–10.5)

## 2010-12-23 LAB — BASIC METABOLIC PANEL
BUN: 27 mg/dL — ABNORMAL HIGH (ref 6–23)
CO2: 23 mEq/L (ref 19–32)
Calcium: 7.8 mg/dL — ABNORMAL LOW (ref 8.4–10.5)
Chloride: 110 mEq/L (ref 96–112)
Creatinine, Ser: 1.03 mg/dL (ref 0.4–1.5)
GFR calc Af Amer: >60 mL/min (ref >60)
GFR calc non Af Amer: >60 mL/min (ref >60)
Glucose, Bld: 101 mg/dL — ABNORMAL HIGH (ref 70–99)
Potassium: 3.1 mEq/L — ABNORMAL LOW (ref 3.5–5.1)
Sodium: 139 mEq/L (ref 135–145)

## 2010-12-24 LAB — WOUND CULTURE: Culture: NO GROWTH

## 2010-12-24 LAB — BASIC METABOLIC PANEL
BUN: 26 mg/dL — ABNORMAL HIGH (ref 6–23)
CO2: 21 mEq/L (ref 19–32)
Calcium: 7.9 mg/dL — ABNORMAL LOW (ref 8.4–10.5)
Chloride: 108 mEq/L (ref 96–112)
Creatinine, Ser: 1.06 mg/dL (ref 0.4–1.5)
GFR calc Af Amer: >60 mL/min (ref >60)
GFR calc non Af Amer: >60 mL/min (ref >60)
Glucose, Bld: 72 mg/dL (ref 70–99)
Potassium: 3.2 mEq/L — ABNORMAL LOW (ref 3.5–5.1)
Sodium: 138 mEq/L (ref 135–145)

## 2010-12-24 LAB — CBC
HCT: 25.9 % — ABNORMAL LOW (ref 39.0–52.0)
Hemoglobin: 9 g/dL — ABNORMAL LOW (ref 13.0–17.0)
MCH: 31.6 pg (ref 26.0–34.0)
MCHC: 34.7 g/dL (ref 30.0–36.0)
MCV: 90.9 fL (ref 78.0–100.0)
Platelets: 412 10*3/uL — ABNORMAL HIGH (ref 150–400)
RBC: 2.85 MIL/uL — ABNORMAL LOW (ref 4.22–5.81)
RDW: 15 % (ref 11.5–15.5)
WBC: 22.3 10*3/uL — ABNORMAL HIGH (ref 4.0–10.5)

## 2010-12-25 LAB — BASIC METABOLIC PANEL
BUN: 24 mg/dL — ABNORMAL HIGH (ref 6–23)
CO2: 23 mEq/L (ref 19–32)
Calcium: 8.2 mg/dL — ABNORMAL LOW (ref 8.4–10.5)
Chloride: 105 mEq/L (ref 96–112)
Creatinine, Ser: 1.02 mg/dL (ref 0.4–1.5)
GFR calc Af Amer: >60 mL/min (ref >60)
GFR calc non Af Amer: >60 mL/min (ref >60)
Glucose, Bld: 96 mg/dL (ref 70–99)
Potassium: 3.9 mEq/L (ref 3.5–5.1)
Sodium: 135 mEq/L (ref 135–145)

## 2010-12-25 LAB — CBC
HCT: 26.3 % — ABNORMAL LOW (ref 39.0–52.0)
Hemoglobin: 9.1 g/dL — ABNORMAL LOW (ref 13.0–17.0)
MCH: 31.6 pg (ref 26.0–34.0)
MCHC: 34.6 g/dL (ref 30.0–36.0)
MCV: 91.3 fL (ref 78.0–100.0)
Platelets: 398 10*3/uL (ref 150–400)
RBC: 2.88 MIL/uL — ABNORMAL LOW (ref 4.22–5.81)
RDW: 15.2 % (ref 11.5–15.5)
WBC: 18 10*3/uL — ABNORMAL HIGH (ref 4.0–10.5)

## 2010-12-25 LAB — PREALBUMIN: Prealbumin: 6 mg/dL — ABNORMAL LOW (ref 17.0–34.0)

## 2010-12-25 NOTE — Op Note (Signed)
NAME:  Micheal [name], Micheal NO.:  [name]  MEDICAL RECORD NO.:  [id number]           PATIENT TYPE:  I  LOCATION:  3103                         FACILITY:  [facility]  PHYSICIAN:  Micheal [name], Micheal [name].DATE OF BIRTH:  02/26/1926  DATE OF PROCEDURE:  12/14/2010 DATE OF DISCHARGE:                              OPERATIVE REPORT   BRIEF HISTORY:  The patient is an 77 year old white male who suffered from back and leg pain.  He was worked up with a lumbar MRI which demonstrated an epidural abnormality.  We suspected to be an epidural abscess.  I discussed the various treatment options with the patient and his family including surgery.  They have weighed the risks, benefits, and alternatives and decided to proceed with a laminectomy for drainage of this epidural presumed abscess.  PREOPERATIVE DIAGNOSES:  Lumbar epidural abscess, lumbago, lumbar radiculopathy.  POSTOPERATIVE DIAGNOSES:  Lumbar epidural abscess, lumbago, lumbar radiculopathy.  PROCEDURE:  L3 through L5 laminectomy for drainage of lumbar epidural abscess at L2-3, 3-4, 4-5, and 5-1 using microdissection.  SURGEON:  Micheal [name], Micheal [name].  ASSISTANT:  Micheal [name], Micheal [name].  ANESTHESIA:  General endotracheal.  ESTIMATED BLOOD LOSS:  100 mL.  SPECIMENS:  None.  CULTURES:  None.  DRAINS:  None.  COMPLICATIONS:  None.  DESCRIPTION OF PROCEDURE:  The patient was brought to the operating room by the Anesthesia Team.  General endotracheal anesthesia was induced. The patient was turned to the prone position on the Wilson frame.  His lumbosacral region was then prepared with Betadine scrub and Betadine solution.  Sterile drapes were applied.  I then injected the area to be incised with Marcaine with epinephrine solution.  I used a scalpel to make a linear midline incision over the L3-4, 4-5, 5-1 interspaces.  I used electrocautery to perform a bilateral subperiosteal dissections exposing spinous process  and lamina of L3 down to upper sacrum.  We obtained intraoperative radiographs to confirm location.  I then used a scalpel to incise the interspinous ligament at L2-3, 3-4, 4-5, 5-1.  I then used a Leksell rongeur to remove the spinous process of L3, 4, 5. I used a high-speed drill to perform bilateral L5, L4, and L3 laminotomies.  I then completed the laminectomy at L5, L4, and L3 with a Kerrison punches and removed the L2-3, 3-4, 4-5, 5-1 ligamentum flavum.  We then brought the operative microscope into the field and under electromagnification and illumination, we completed the microdissection/decompression.  I freed up the epidural tissue from the underlying dura and thecal sac and performed foraminotomies about the bilateral L3, 4, 5, and S1 nerve roots.  We dissected in the ventral epidural space, and upon doing so, we encountered areas of purulent material.  We obtained cultures.  We then irrigated the wound out copiously with bacitracin solution.  We palpated along the ventral thecal sac at L2-3, 3-4, 4-5, 5-1 and along the exit route of the L3, 4, 5, and S1 nerve roots and noted that the neural structures were well decompressed.  We obtained hemostasis using bipolar electrocautery.  We then removed the retractor and reapproximated the patient's thoracolumbar fascia  with interrupted #1 Vicryl suture, subcutaneous tissue with interrupted 2-0 Vicryl suture, and skin with Steri-Strips and Benzoin.  The wound was then coated with bacitracin ointment.  A sterile dressing was applied.  The drapes were removed.  The patient was subsequently returned to supine position where Micheal [name] took over the operation to operate on his left knee.  All sponge, instrument, and needle counts were correct at the end of this operation.     Micheal [name], Micheal [name].     JDJ/MEDQ  D:  12/14/2010  T:  12/15/2010  Job:  [id number]  Electronically Signed by Micheal [name]. on 12/25/2010 10:11:50 PM

## 2010-12-25 NOTE — Consult Note (Signed)
NAME:  Micheal [name], Micheal [name] NO.:  [id number]  MEDICAL RECORD NO.:  [id number]           PATIENT TYPE:  O  LOCATION:  5121                         FACILITY:  [facility]  PHYSICIAN:  Cristi Loron, M.D.DATE OF BIRTH:  [birth year]  DATE OF CONSULTATION:  12/13/2010 DATE OF DISCHARGE:                                CONSULTATION   CHIEF COMPLAINT:  Back pain.  HISTORY OF PRESENT ILLNESS:  The patient is an 77 year old white male who has had chronic back pain.  The patient is not a great medical historian, but the best I could tell he has had chronic back pain for years and may have seen Dr. Fannie Knee in the past as well as possibly Dr. Danielle Dess in the past regarding this.  He states he has never had back surgery.  The patient had off and on back pain for years.  More recently, over the last few weeks he has had increasing back pain.  He tells me that some time in February 2012 he has some teeth removed and after he had the teeth removed he developed a bout of shingles with them and lumbar and abdominal rash.  He was treated medically and the severe pain resolved, but he has had some pruritus in that region.  More recently, he has had increasing low back pain with pain in his bilateral legs and feet, worse upon standing.  The patient complained to Dr. Earl Gala regarding this and was worked up with lumbar MRI which demonstrated some ventral epidural abnormal signa and he was admitted for further workup.  Presently, the patient complains of midline back pain greater than hip and leg pain.  His legs bother him equally.  Symptoms worsen upon standing.  PAST MEDICAL HISTORY:  The patient is not a great medical historian, but the best I could tell he has been diagnosed with restless legs syndrome, neuropathy, Meniere disease, melanoma, cataracts, knee osteoarthritis, asthma, COPD, and hypercholesteremia.  The patient denies heart attack, stroke, etc.  PAST SURGICAL HISTORY:  He has  had knee surgery, Mohs surgery, and cataract surgery.  MEDICATIONS PRIOR TO ADMISSION:  Viagra p.r.n., p.r.n. oxacillin, hydrocodone, glucosamine, flaxseed oil, fish oil, calcium, simvastatin, potassium chloride, VESIcare, ropinirole, Combivent inhaler, Singulair inhaler.  DRUG ALLERGIES:  None.  FAMILY MEDICAL HISTORY:  Noncontributory.  SOCIAL HISTORY:  The patient is married.  He has 3 children.  He lives in [place].  He is retired.  He quit smoking tobacco many years ago. He denies ethanol drug use.  REVIEW OF SYSTEMS:  Negative except as above.  PHYSICAL EXAMINATION:  GENERAL:  A pleasant 77 year old white male in no apparent distress. HEENT:  Normocephalic.  He has got an area on his scalp which has been skin grafted which is healing well.  His pupils are as follows:  OS 5 mm, OD 3 mm, irregular OU, and reactive OU.  Extraocular muscles are intact.  Oropharynx is benign.  He is wearing dentures. NECK:  Supple.  No masses, deformities, or tracheal deviation.  He has decreased cervical range of motion, but appropriate for his age. Spurling testing is negative.  Lhermitte sign was not  present.  Thorax is symmetric. ABDOMEN:  Soft.  I do not see evidence of shingles. BACK:  There is no point tenderness or deformities.  Pearlean Brownie testing is negative bilaterally.  Straight leg testing is negative bilaterally, in fact this is causing pain in his knee. NEUROLOGIC:  The patient is alert and oriented x3.  Cranial nerves II- XII bilaterally grossly normal except he has bilateral presbycusis.  His vision is grossly normal bilaterally.  Motor strength is 5/5 in biceps, triceps, handgrip, deltoid, quadriceps, gastrocnemius, and dorsiflexors. His deep tendon reflexes are 1-2/4 in his biceps, triceps, quadriceps, gastrocnemius and no ankle clonus.  Sensory exam is intact to light touch sensation to all tested dermatomes bilaterally.  Cerebellar function is intact to rapid alternating  movements of the upper extremities bilaterally.  IMAGING STUDIES:  I reviewed the patient's lumbar MRI performed at [facility] without contrast today.  It demonstrates that the patient has diffuse age-appropriate degenerative changes.  I do not see any fractures or significant subluxations.  He does have some ventral epidural abnormal tissue.  He had no CT evidence of diskitis.  He does have multifactorial severe stenosis at L4-5 and has some ventral thecal sac compression at L5-S1.  ASSESSMENT/PLAN:  Back pain, degenerative changes, abnormal epidural tissue.  I have discussed the situation with the patient.  I have recommended that he be worked up further with a lumbar MRI with contrast to help distinguish between degenerative changes versus infection.  I favor degenerative ages since I do not see any evidence of diskitis or osteomyelitis, abnormal psoas edema, etc.  We will also work him up further with blood cultures and a sed rate.  I recommend we have Physical Therapy see the patient if he does not improve and also I could always biopsy the ventral lesion, but I think at this point they are more likely to be degenerative in nature.     Cristi Loron, M.D.     JDJ/MEDQ  D:  12/13/2010  T:  12/14/2010  Job:  [id number]  Electronically Signed by Tressie Stalker M.D. on 12/25/2010 10:11:07 PM

## 2010-12-25 NOTE — Op Note (Signed)
NAME:  Micheal [name], Micheal [name] NO.:  [id number]  MEDICAL RECORD NO.:  [id number]  LOCATION:  3103                         FACILITY:  [facility]  PHYSICIAN:  Cristi Loron, M.D.DATE OF BIRTH:  [birth year]  DATE OF PROCEDURE:  12/22/2010 DATE OF DISCHARGE:                              OPERATIVE REPORT   BRIEF HISTORY:  The patient is an 77 year old white male who was found to have a spontaneous lumbar epidural abscess as well as a left prosthetic knee infection about a week ago.  I performed a L3-4 laminectomy and we identified infection, took some cultures.  The patient has been treated with antibiotics via the ID service.  The patient has had persistent fevers and elevated white count.  He was worked up with a postoperative lumbar MRI which demonstrated the patient continues to have what appeared to be epidural fluid.  I discussed situation with the Infectious Disease, Dr. Ninetta Lights as well as subsequently with the patient and the patient's wife and daughter.  We discussed various treatments including another laminectomy for drainage of this presumed epidural fluid.  I described the surgery and discussed the risks, benefits, alternatives, I have answered all the patient's family's questions.  They decided proceed with the operation.  PREOPERATIVE DIAGNOSIS:  Recurrent lumbar epidural abscess.  POSTOPERATIVE DIAGNOSIS:  Recurrent lumbar epidural abscess.  PROCEDURE:  Redo L3 and L4 laminectomy with L2 laminectomy with drainage of epidural abscess as well as debulking of subdural abscess using microdissection.  SURGEON:  Cristi Loron, MD  ASSISTANT:  Coletta Memos, MD  ANESTHESIA:  General endotracheal.  ESTIMATED BLOOD LOSS:  150 mL.  SPECIMENS:  Cultures and some neural mass biopsy.  DRAIN:  None.  COMPLICATIONS:  None.  DESCRIPTION OF PROCEDURE:  The patient was brought to the operating room by Anesthesia Team.  General endotracheal anesthesia was  induced.  The patient was turned to the prone position on the Wilson frame.  His lumbosacral region was then prepared with Betadine scrub and Betadine solution.  Sterile drapes were applied.  I then used a scalpel to incise through the patient's fresh surgical scar.  Upon doing so there was discharge of purulent material in the subcutaneous space.  We cultured this.  I then used the scissors to ligate the sutures holding the patient's thoracolumbar fascia together.  We then dissected deeper and encountered more infection in the epidural space and I then inserted the McCullough retractor for exposure and careful use of a Penfield #4 to dissect epidural space and expose the dura from the caudal edge of L3-L5 to the cephalad edge of L5.  I also then used Leksell rongeur to remove the L2 spinous process and a Kerrison punch to remove the L2 lamina, I dissected ventral to the thecal sac behind the L2, L3-L4 vertebral bodies.  We encountered some purulent-appearing material, but no frank pus per se.  We also encountered quite a bit of granulation tissue and I debulked this with a micropituitary forceps decompressing the thecal sac.  Because I did not encounter a whole lot of purulent material in the ventral epidural space, I was concerned that the patient may indeed have intrathecal infection.  We inspected the  dura and noted it was somewhat tense and not particular pulsatile.  We then decided to explore the intradural space.  We brought the operative microscope into field and under specification examination we incised the dura, I performed durotomy with 15 blade scalpel.Upon incising the dura we immediately encountered purulent material in the subdural space.  We extended durotomy in the cephalad and caudal direction.  The medial evident was effected the subarachnoid space was "tense" in that the nerve roots were not mobile.  There was no visible spinal fluid.  We also encountered quite a bit  of subdural purulent material.  We biopsied this and removed it carefully using a microdissection and suction and irrigation.  We then dissected down to the subarachnoid space through the caudal quinine and encountered quite a bit of granulation/purulent material ventral to the caudal quinine in the ventral subdural space.  We used microdissection to free this up and debulked it as best we could with  micropituitary forceps.  We did this from approximately L2 down to the cephalad aspect of L5.  We appeared to get a good decompression of the caudal quinine.  We then irrigated the subarachnoid and subdural space with bacitracin solution.  We then closed the patient's durotomy with a running 6-0 Prolene suture.  We then placed a DuraSeal over the durotomy and then obtained hemostasis using bipolar cautery.  We then removed the retractor and then reapproximated the patient's thoracolumbar fascia with interrupted #1 Vicryl suture and subcutaneous tissue with 2-0 Vicryl suture.  We reapproximated the skin with a running 3-0 nylon suture.  The wound was then coated with bacitracin ointment.  Sterile dressing applied.  The drapes were removed.  The patient was subsequently returned to supine position where he was extubated by Anesthesia Team and transported to Postanesthesia Care Unit in stable condition.  All sponge, instrument, and needle counts were correct at the case.     Cristi Loron, M.D.     JDJ/MEDQ  D:  12/22/2010  T:  12/23/2010  Job:  [id number]  Electronically Signed by Tressie Stalker M.D. on 12/25/2010 10:17:32 PM

## 2010-12-26 ENCOUNTER — Inpatient Hospital Stay (HOSPITAL_COMMUNITY): Payer: Medicare Other

## 2010-12-26 LAB — CBC
HCT: 26.4 % — ABNORMAL LOW (ref 39.0–52.0)
Hemoglobin: 9.1 g/dL — ABNORMAL LOW (ref 13.0–17.0)
MCH: 31.8 pg (ref 26.0–34.0)
MCHC: 34.5 g/dL (ref 30.0–36.0)
MCV: 92.3 fL (ref 78.0–100.0)
Platelets: 421 10*3/uL — ABNORMAL HIGH (ref 150–400)
RBC: 2.86 MIL/uL — ABNORMAL LOW (ref 4.22–5.81)
RDW: 15.3 % (ref 11.5–15.5)
WBC: 15 10*3/uL — ABNORMAL HIGH (ref 4.0–10.5)

## 2010-12-26 LAB — CROSSMATCH: Unit division: 0

## 2010-12-27 DIAGNOSIS — T8450XA Infection and inflammatory reaction due to unspecified internal joint prosthesis, initial encounter: Secondary | ICD-10-CM

## 2010-12-27 LAB — ANAEROBIC CULTURE

## 2010-12-27 LAB — BASIC METABOLIC PANEL
BUN: 22 mg/dL (ref 6–23)
CO2: 23 mEq/L (ref 19–32)
Calcium: 7.5 mg/dL — ABNORMAL LOW (ref 8.4–10.5)
Chloride: 105 mEq/L (ref 96–112)
Creatinine, Ser: 1.02 mg/dL (ref 0.4–1.5)
GFR calc Af Amer: >60 mL/min (ref >60)
GFR calc non Af Amer: >60 mL/min (ref >60)
Glucose, Bld: 79 mg/dL (ref 70–99)
Potassium: 3.4 mEq/L — ABNORMAL LOW (ref 3.5–5.1)
Sodium: 136 mEq/L (ref 135–145)

## 2010-12-27 LAB — CBC
HCT: 24.8 % — ABNORMAL LOW (ref 39.0–52.0)
Hemoglobin: 8.5 g/dL — ABNORMAL LOW (ref 13.0–17.0)
MCH: 31.8 pg (ref 26.0–34.0)
MCHC: 34.3 g/dL (ref 30.0–36.0)
MCV: 92.9 fL (ref 78.0–100.0)
Platelets: 398 10*3/uL (ref 150–400)
RBC: 2.67 MIL/uL — ABNORMAL LOW (ref 4.22–5.81)
RDW: 15.6 % — ABNORMAL HIGH (ref 11.5–15.5)
WBC: 11.7 10*3/uL — ABNORMAL HIGH (ref 4.0–10.5)

## 2010-12-28 LAB — GLUCOSE, CAPILLARY
Glucose-Capillary: 141 mg/dL — ABNORMAL HIGH (ref 70–99)
Glucose-Capillary: 105 mg/dL — ABNORMAL HIGH (ref 70–99)
Glucose-Capillary: 109 mg/dL — ABNORMAL HIGH (ref 70–99)

## 2010-12-28 LAB — CROSSMATCH

## 2010-12-28 LAB — CBC
HCT: 37.4 % — ABNORMAL LOW (ref 39.0–52.0)
Hemoglobin: 13 g/dL (ref 13.0–17.0)
MCH: 31.8 pg (ref 26.0–34.0)
MCHC: 34.8 g/dL (ref 30.0–36.0)
MCV: 91.4 fL (ref 78.0–100.0)
RBC: 4.09 MIL/uL — ABNORMAL LOW (ref 4.22–5.81)

## 2010-12-28 LAB — BASIC METABOLIC PANEL
BUN: 28 mg/dL — ABNORMAL HIGH (ref 6–23)
CO2: 20 mEq/L (ref 19–32)
Calcium: 8.4 mg/dL (ref 8.4–10.5)
GFR calc non Af Amer: 31 mL/min — ABNORMAL LOW (ref >60)
Glucose, Bld: 113 mg/dL — ABNORMAL HIGH (ref 70–99)

## 2010-12-28 NOTE — Op Note (Signed)
NAME:  Micheal Frank, Micheal Frank NO.:  [id number]  MEDICAL RECORD NO.:  [id number]           PATIENT TYPE:  I  LOCATION:  3103                         FACILITY:  [facility]  PHYSICIAN:  Burnard Bunting, M.D.    DATE OF BIRTH:  12-10-1925  DATE OF PROCEDURE:  12/14/2010 DATE OF DISCHARGE:                              OPERATIVE REPORT   PREOPERATIVE DIAGNOSIS:  Left total knee replacement infection.  PROCEDURE:  Left total knee replacement infection.  PROCEDURES:  Left total knee irrigation and debridement with excisional debridement, synovectomy, and thorough irrigation.  SURGEON:  Burnard Bunting, MD.  ASSISTANT:  None.  ANESTHESIA:  General endotracheal.  ESTIMATED BLOOD LOSS:  25 mL.  DRAINS:  Hemovac x1.  CULTURES:  Obtained x1.  INDICATIONS:  Micheal Frank is an 77 year old patient with gram- positive cocci and knee aspirate aspirated today.  He also has a back infection.  He presents now for operative management and the potential in an attempt to save the prosthesis with debridement and poly exchange. The patient and the patient's family understand the risks and benefits.  PROCEDURE IN DETAIL:  The patient was brought to operating room where general endotracheal anesthesia was induced.  This is a second part of the procedure.  Dictated separately by Dr. Lovell Sheehan, the first part was the back operation.  Second part is the total knee operation.  Time-out was called.  Left leg was prepped with a DuraPrep and draped in a sterile manner.  The leg was elevated.  Esmarch wrap was not used.  The leg was elevated and the tourniquet was then inflated.  The Esmarch was not utilized.  At this time, anterior approach to the knee was utilized. Skin and subcutaneous tissues were sharply divided.  Median parapatellar approach was made.  Frank pus was encountered within the knee joint. This was sent for culture, and the polyethylene liner was removed, it was a 21 polyethylene  liner on the Scorpio total knee.  This was removed.  It was saved for later implantation and it was placed in a solution of Betadine and saline and allowed to sit for approximately 30 minutes.  At this time, complete synovectomy anterior and posterior was performed.  Care was taken to avoid injury to the posterior neurovascular bundle.  This was done sharply as well as with the use of curettes and rongeurs.  Following thorough and complete synovectomy, the incision was irrigated with 9 liters of pulsatile solution and three each of these liters Betadine was utilized and allowed to sit in the knee with saline half and half solution for approximately 1 minute. Hibiclens was also used in the knee and irrigated out with 3 liters bags of saline.  Following thorough irrigation, thrombin spray was placed to decrease bleeding.  The polyethylene liner which had been sitting in Betadine was then placed back and seated well into the into knee.  At this time, tourniquet was released.  Bleeding points were encountered and cauterized with electrocautery.  Running 0 Prolene and 2-0 Prolene was then used to close the deep and superficial layers.  Hemovac drain was placed.  The patient  was then placed into a bulky dressing, knee immobilizer, tolerated the procedure well without immediate complications.  Total tourniquet time 1 hour and 20 minutes.  Plan is for repeat I and D, placement of antibiotic beads.     Burnard Bunting, M.D.     GSD/MEDQ  D:  12/15/2010  T:  12/15/2010  Job:  [id number]  Electronically Signed by Reece Agar.  [name] M.D. on 12/28/2010 03:24:53 PM

## 2010-12-28 NOTE — Op Note (Signed)
  NAME:  Micheal Frank, Micheal Frank NO.:  [id number]  MEDICAL RECORD NO.:  [id number]  LOCATION:                                 FACILITY:  PHYSICIAN:  Burnard Bunting, M.D.    DATE OF BIRTH:  22-Nov-1925  DATE OF PROCEDURE:  12/16/2010 DATE OF DISCHARGE:                              OPERATIVE REPORT   PREOPERATIVE DIAGNOSIS:  Left knee infection around total joint.  POSTOPERATIVE DIAGNOSIS:  Left knee infection around total joint.  PROCEDURE:  Left total knee repeat I and D, excisional debridement, poly exchange, placement of antibiotic beads.  SURGEON:  Burnard Bunting, MD  ASSISTANT:  Vanita Panda. Magnus Ivan, MD  ANESTHESIA:  General endotracheal.  ESTIMATED BLOOD LOSS:  Minimal.  INDICATIONS: 1. The patient has left total knee arthroplasty infection presents now     for washout. 2. Placement of antibiotic beads and polyethylene liner exchange.  TOURNIQUET TIME:  Twenty eight minutes at 300 mmHg.  PROCEDURE IN DETAIL:  The patient was brought to the operating room where general endotracheal anesthesia was induced.  Preoperative antibiotics were administered.  Left leg was prepped with DuraPrep solution and draped in sterile manner.  Time-out was called.  Left leg was elevated and tourniquet was inflated.  Sutures were removed from the skin.  The skin in the anterior aspect of the patella had about a 2 x 2 cm of early necrosis.  This was excised.  Running suture was also removed.  In general, post synovectomy of the knee looked reasonably good.  Repeat debridement with a curette was performed.  Six liters of irrigating solution were then passed through the knee.  Old poly was removed prior to this procedure.  New poly was then placed.  Antibiotic beads were placed.  Vancomycin 500 mg plus three vials of gentamicin were placed in the stimulant beads.  The knee was then closed using #1 Vicryl suture for the arthrotomy.  Watertight closure was achieved.  The skin  was closed using interrupted inverted 2-0 and 3-0 Vicryl followed by a simple 3-0 nylon.  Bulky dressing and knee immobilizer was placed.  The patient tolerated the procedure well without immediate complication.  Dr. Eliberto Ivory assistance was required during the case for retraction for neurovascular structures.  His assistance was a medical necessity.     Burnard Bunting, M.D.     GSD/MEDQ  D:  12/16/2010  T:  12/17/2010  Job:  [id number]  Electronically Signed by Reece Agar.  Mahum Betten M.D. on 12/28/2010 03:24:55 PM

## 2010-12-28 NOTE — Consult Note (Signed)
NAME:  Micheal Frank NO.:  [id number]  MEDICAL RECORD NO.:  [id number]           PATIENT TYPE:  I  LOCATION:  3103                         FACILITY:  [facility]  PHYSICIAN:  Burnard Bunting, M.D.    DATE OF BIRTH:  05/15/1926  DATE OF CONSULTATION: DATE OF DISCHARGE:                                CONSULTATION   REQUESTING PHYSICIAN:  Theressa Millard, MD.  CHIEF COMPLAINT:  Left knee pain and general failure to thrive.  HISTORY OF PRESENT ILLNESS:  Micheal Frank is an 77 year old patient who is admitted by Dr. Allie Dimmer for failure to thrive and he also reports back pain and left knee pain.  The patient stated to Dr. Allie Dimmer he was weak and not feeling very well.  In the course of his admission, MRI scan of lumbar spine showed a potential abscess in the thoracolumbar region.  He was also noted to have left knee pain and swelling at the site of a left TKA revision done several years ago.  He states his knee was functioning very well prior to the onset of swelling.  He denies any history of significant injury.  His current medications Viagra, oxycodone, glucosamine, flaxseed oil, fish oil, calcium carbonate, simvastatin, potassium chloride, VESIcare, ropinirole, Combivent, Singulair.  All systems reviewed are negative.  Related to the knee, he does report some swelling in the knee and increasing pain which again has been overshadowed by the pain in his back running down both of his legs.  PHYSICAL EXAMINATION:  GENERAL:  He is well developed, well nourished, in no acute distress, answers some questions appropriately but is not fully alert and oriented. EXTREMITIES:  He has got palpable pedal pulses, good dorsiflexion and plantarflexion strength bilaterally.  He does have a moderate left knee effusion.  No groin pain in the internal rotation of the leg.  Extensor mechanism is intact on the left hand side.  There is no proximal lymphadenopathy.  Knee itself is warm.   All of the joints examined of upper and lower extremities, wrist, elbow, shoulder show a reasonable range of motion without much in the way of crepitus, swelling, or pain. Right lower extremity no effusion in the right knee.  Ankle and hip range of motion on right hand side is intact.  Radiographs indicated MRI of the lumbar spine shows ventral epidural collection extending from L2-3 to L5-S1.  Hematoma or abscess in possible.  In addition, x-rays at the office were reviewed.  He has a long-stem tibial revision as well as native femoral prosthetic component.  The knee was aspirated today under sterile conditions.  Time- out called.  We did get about 40 mL of purulent fluid collection and this has 185,000 wbc's, gram-positive cocci in pairs also noted on Gram stain.  IMPRESSION: 1. Left prosthetic knee infection, gram-positive cocci. 2. Possible epidural abscess which is increased in volume from     yesterday.  PLAN:  Dr. Lovell Sheehan from Neurosurgery has evaluated the patient.  Surgery is planned for 7 o'clock tonight.  Lovenox was given at 1600 yesterday. I talked to the pharmacist that about 5-hour half-life should be about 6%  active or less by the time knee surgery is performed.  Plan would be minor exchange and irrigation and debridement, chronic antibiotic suppression, a 2-stage reimplantation with a long-stem tibial prosthesis would be a very large undertaking.  Did talk with the patient about 20- 25% success rate with suppression and washout.  If that does not work, then we would need a plan for later removal of components.  That with the long-stem implant, I think that would be a significant undertaking potentially having to put the tibia in order to remove the prosthesis. Special equipment would have to be ordered to remove the sleeve.  For that reason, best course of action at this time would be washout with antibiotic suppression.  If that fails, further surgery with  antibiotic bead placement could be performed.  As all this was discussed with the patient's family, risks and benefits including but not limited to persistent infection, need for more surgery.  Discussed with the patient, all questions answered.     Burnard Bunting, M.D.     GSD/MEDQ  D:  12/14/2010  T:  12/15/2010  Job:  [id number]  Electronically Signed by Reece Agar.  [name] M.D. on 12/28/2010 03:24:49 PM

## 2010-12-29 LAB — CBC
Hemoglobin: 10.2 g/dL — ABNORMAL LOW (ref 13.0–17.0)
MCH: 31.9 pg (ref 26.0–34.0)
MCHC: 34.5 g/dL (ref 30.0–36.0)
MCV: 92.5 fL (ref 78.0–100.0)
RBC: 3.2 MIL/uL — ABNORMAL LOW (ref 4.22–5.81)

## 2010-12-29 LAB — BASIC METABOLIC PANEL
CO2: 23 mEq/L (ref 19–32)
Calcium: 8 mg/dL — ABNORMAL LOW (ref 8.4–10.5)
Creatinine, Ser: 1.37 mg/dL (ref 0.4–1.5)
GFR calc non Af Amer: 50 mL/min — ABNORMAL LOW (ref >60)
Glucose, Bld: 89 mg/dL (ref 70–99)
Sodium: 135 mEq/L (ref 135–145)

## 2010-12-29 LAB — GLUCOSE, CAPILLARY
Glucose-Capillary: 109 mg/dL — ABNORMAL HIGH (ref 70–99)
Glucose-Capillary: 102 mg/dL — ABNORMAL HIGH (ref 70–99)
Glucose-Capillary: 99 mg/dL (ref 70–99)
Glucose-Capillary: 101 mg/dL — ABNORMAL HIGH (ref 70–99)
Glucose-Capillary: 101 mg/dL — ABNORMAL HIGH (ref 70–99)
Glucose-Capillary: 99 mg/dL (ref 70–99)
Glucose-Capillary: 101 mg/dL — ABNORMAL HIGH (ref 70–99)

## 2010-12-29 LAB — URINE CULTURE: Culture: NO GROWTH

## 2010-12-30 LAB — CBC
HCT: 31.1 % — ABNORMAL LOW (ref 39.0–52.0)
Hemoglobin: 10.8 g/dL — ABNORMAL LOW (ref 13.0–17.0)
MCH: 32.1 pg (ref 26.0–34.0)
MCHC: 34.7 g/dL (ref 30.0–36.0)
MCV: 92.6 fL (ref 78.0–100.0)
Platelets: 279 10*3/uL (ref 150–400)
RBC: 3.36 MIL/uL — ABNORMAL LOW (ref 4.22–5.81)
RDW: 14.9 % (ref 11.5–15.5)
WBC: 14 10*3/uL — ABNORMAL HIGH (ref 4.0–10.5)

## 2010-12-30 LAB — DIFFERENTIAL
Basophils Absolute: 0 10*3/uL (ref 0.0–0.1)
Basophils Relative: 0 % (ref 0–1)
Eosinophils Absolute: 0.4 10*3/uL (ref 0.0–0.7)
Eosinophils Relative: 3 % (ref 0–5)
Lymphocytes Relative: 9 % — ABNORMAL LOW (ref 12–46)
Lymphs Abs: 1.3 10*3/uL (ref 0.7–4.0)
Monocytes Absolute: 1.4 10*3/uL — ABNORMAL HIGH (ref 0.1–1.0)
Monocytes Relative: 10 % (ref 3–12)
Neutro Abs: 10.9 10*3/uL — ABNORMAL HIGH (ref 1.7–7.7)
Neutrophils Relative %: 78 % — ABNORMAL HIGH (ref 43–77)

## 2010-12-30 LAB — BASIC METABOLIC PANEL
BUN: 18 mg/dL (ref 6–23)
CO2: 24 mEq/L (ref 19–32)
Calcium: 8.1 mg/dL — ABNORMAL LOW (ref 8.4–10.5)
Chloride: 101 mEq/L (ref 96–112)
Creatinine, Ser: 1.07 mg/dL (ref 0.50–1.35)
GFR calc Af Amer: >60 mL/min (ref >60)
GFR calc non Af Amer: >60 mL/min (ref >60)
Glucose, Bld: 80 mg/dL (ref 70–99)
Potassium: 3.7 mEq/L (ref 3.5–5.1)
Sodium: 132 mEq/L — ABNORMAL LOW (ref 135–145)

## 2011-01-01 LAB — URINALYSIS, ROUTINE W REFLEX MICROSCOPIC
Bilirubin Urine: NEGATIVE
Glucose, UA: NEGATIVE mg/dL
Ketones, ur: NEGATIVE mg/dL
Leukocytes, UA: NEGATIVE
Nitrite: NEGATIVE
Protein, ur: 30 mg/dL — AB
Specific Gravity, Urine: 1.015 (ref 1.005–1.030)
Urobilinogen, UA: 0.2 mg/dL (ref 0.0–1.0)
pH: 7.5 (ref 5.0–8.0)

## 2011-01-01 LAB — CBC
MCH: 31.3 pg (ref 26.0–34.0)
MCHC: 33.8 g/dL (ref 30.0–36.0)
Platelets: 272 10*3/uL (ref 150–400)

## 2011-01-01 LAB — URINE MICROSCOPIC-ADD ON

## 2011-01-01 LAB — BASIC METABOLIC PANEL
Calcium: 8.4 mg/dL (ref 8.4–10.5)
GFR calc non Af Amer: >60 mL/min (ref >60)
Glucose, Bld: 94 mg/dL (ref 70–99)
Sodium: 130 mEq/L — ABNORMAL LOW (ref 135–145)

## 2011-01-02 LAB — CBC
HCT: 33.5 % — ABNORMAL LOW (ref 39.0–52.0)
Hemoglobin: 11.7 g/dL — ABNORMAL LOW (ref 13.0–17.0)
MCH: 32.1 pg (ref 26.0–34.0)
MCHC: 34.9 g/dL (ref 30.0–36.0)
MCV: 91.8 fL (ref 78.0–100.0)
Platelets: 252 10*3/uL (ref 150–400)
RBC: 3.65 MIL/uL — ABNORMAL LOW (ref 4.22–5.81)
RDW: 14 % (ref 11.5–15.5)
WBC: 13.2 10*3/uL — ABNORMAL HIGH (ref 4.0–10.5)

## 2011-01-02 LAB — BASIC METABOLIC PANEL
BUN: 18 mg/dL (ref 6–23)
CO2: 25 mEq/L (ref 19–32)
Calcium: 8.5 mg/dL (ref 8.4–10.5)
Chloride: 95 mEq/L — ABNORMAL LOW (ref 96–112)
Creatinine, Ser: 1.14 mg/dL (ref 0.50–1.35)
GFR calc Af Amer: >60 mL/min (ref >60)
GFR calc non Af Amer: >60 mL/min (ref >60)
Glucose, Bld: 85 mg/dL (ref 70–99)
Potassium: 3.8 mEq/L (ref 3.5–5.1)
Sodium: 129 mEq/L — ABNORMAL LOW (ref 135–145)

## 2011-01-02 LAB — C-REACTIVE PROTEIN: CRP: 17.4 mg/dL — ABNORMAL HIGH (ref <0.6)

## 2011-01-02 LAB — SEDIMENTATION RATE: Sed Rate: 77 mm/hr — ABNORMAL HIGH (ref 0–16)

## 2011-01-02 NOTE — Consult Note (Signed)
NAME:  Micheal Frank, Micheal Frank NO.:  [id number]  MEDICAL RECORD NO.:  [id number]           PATIENT TYPE:  I  LOCATION:  3103                         FACILITY:  [facility]  PHYSICIAN:  Gardiner Barefoot, MD    DATE OF BIRTH:  07/19/1925  DATE OF CONSULTATION: DATE OF DISCHARGE:                                CONSULTATION   REASON FOR CONSULTATION:  Management of infected prosthetic joint.  HISTORY OF PRESENT ILLNESS:  This is an 77 year old male with a history of chronic low back pain, GERD, hyperlipidemia who had come to the emergency room complaining of worsening back pain.  The patient's wife does report that he had had general feeling of malaise and increased pain in his back over the last 2-3 days but no fever at home.  He had no complaints of any chills.  He is complaining very little of his left knee but did not seem to be able to use his leg as much recently.  The patient had an initial MRI in the emergency room that did not show any concerns for infection but also noted to have left knee swelling and was seen in consultation by the orthopedic team.  Subsequent cath was done of the knee and was significant for what appeared to be pus and a high white count of 183,000 with a Gram stain that was significant for Gram- positive cocci.  He also had a peripheral white blood count of 24,000 on admission.  Infectious Disease consult was called for management of the prosthetic joint infection.  PAST MEDICAL HISTORY: 1. Hypercholesterolemia. 2. History of left total knee replacement in 2009. 3. Chronic back pain. 4. GERD. 5. History of Meniere's disease. 6. Depression. 7. COPD. 8. Obstructive sleep apnea.  MEDICATIONS IN HOSPITAL: 1. Wellbutrin 200 mg twice a day. 2. Valium 5 mg daily. 3. Calcium 500 mg daily. 4. Cardura 4 mg twice a day. 5. Pepcid 20 mg twice a day. 6. Advair 1 puff inhaled twice a day. 7. Singulair 10 mg daily. 8. Zocor 40 mg every evening. 9.  ReQuip 1 mg daily.  ALLERGIES:  No known drug allergies.  SOCIAL HISTORY:  The patient does have remote history of tobacco use and significant alcohol use.  FAMILY HISTORY:  No history of arthritis.  REVIEW OF SYSTEMS:  A 12-point review of systems was obtained and was negative except as per history of present illness.  PHYSICAL EXAMINATION:  VITALS:  Temperature 97.2, pulse 73, respirations 19, blood pressure 137/66, O2 sat 92% on 3 liters nasal cannula. GENERAL:  The patient is awake and alert though sleepy following a pain pill. HEENT:  No cervical lymphadenopathy. CARDIOVASCULAR:  Regular rate and rhythm.  No murmurs, rubs, or gallops. LUNGS:  Clear to auscultation bilaterally. ABDOMEN:  Soft, nontender, nondistended with positive bowel sounds.  No hepatosplenomegaly. EXTREMITIES:  Left knee with notable effusion remaining and somewhat warmth.  The patient does not complain of any tenderness.  LABORATORY DATA:  WBC 22,000, hemoglobin 13.3, and platelets 329,000. WBC of the synovial fluid is 183,000, 97% neutrophils, and Gram stain of Gram positive cocci.  Sed rate is 52,  creatinine is 1.39.  ASSESSMENT:  A 77 year old male with prosthetic joint infection of the knee, also with chronic back pain.  RECOMMENDATIONS: 1. Prosthetic joint infection.  The patient has had a culture done and     therefore we will start empiric antibiotics with vancomycin for the     Gram-positive cocci noted on Gram stain.  The antibiotics then will     be tapered based on any final cultures.  The patient likely will     need, if possible, definitive therapy of one or two stage revision     of the prosthetic joint since he has a does have a late infection     since his surgery more than 2 years ago.  He will also need long-     term antibiotics, approximately 6 weeks of therapy.  I have put in     for a PICC line to be placed for the patient and further management     with further washouts per the  orthopedic team. 2. Back pain.  The patient was seen in consultation by Neurosurgery     who is following these MRI results.  MRI with contrast is pending     at this time.  Initial MRI did not suggest any infection.  I will     continue to monitor.  I will await for MRI with contrast to find if     there is any further suggestion of infection.     Gardiner Barefoot, MD     RWC/MEDQ  D:  12/14/2010  T:  12/15/2010  Job:  [id number]  Electronically Signed by Staci Righter MD on 01/02/2011 11:20:05 AM

## 2011-01-03 LAB — CULTURE, BLOOD (ROUTINE X 2)
Culture  Setup Time: 201206131305
Culture: NO GROWTH

## 2011-01-06 NOTE — Discharge Summary (Signed)
NAME:  Micheal Frank, Micheal Frank NO.:  [id number]  MEDICAL RECORD NO.:  [id number]  LOCATION:  6731                         FACILITY:  [facility]  PHYSICIAN:  Theressa Millard, M.D.    DATE OF BIRTH:  15-Sep-1925  DATE OF ADMISSION:  12/13/2010 DATE OF DISCHARGE:  01/04/2011                              DISCHARGE SUMMARY   ADMITTING DIAGNOSES:  Fever and failure to thrive.  DISCHARGE DIAGNOSES: 1. Methicillin-sensitive Staphylococcus aureus sepsis. 2. Epidural abscess secondary to staphylococcus status post drainage     on Dec 14, 2010, with repeat procedure on December 22, 2010, secondary     to reaccumulation of fluid. 3. Infected total knee replacement (left) with initial drainage on Dec 14, 2010, followed by poly exchange and implantation of antibiotic     beads on December 16, 2010. 4. Clostridium difficile colitis. 5. Sacral decubitus currently unstaged. 6. Malnutrition. 7. Urinary retention, status post two failed attempts at voiding. 8. Obstructive sleep apnea on CPAP at 13 cm water pressure. 9. Benign prostatic hypertrophy. 10.History of Meniere disease. 11.History of neuropathy and restless legs syndrome. 12.History of melanoma. 13.Chronic obstructive pulmonary disease. 14.History of lumbar radiculopathy. 15.Hypercholesterolemia.  The patient is an 77 year old white male who presented to the office with failure to thrive over several weeks.  He actually dated this back to removal of 7 teeth back in February and subsequently, has not been able to have very good nutrition.  He never really got teeth that fit properly.  In any event, he presented to the office with a kind of failure to thrive and some low back pain that radiated to his legs when he stood.  HOSPITAL COURSE:  The patient was admitted and an MRI was done urgently his lower back.  This revealed a fluid collection.  This was not consistent completely with epidural abscesses.  There seemed to be no involved  done.  However, he also had swelling of his left knee.  I asked Dr. Lovell Sheehan in Neurosurgery, and [name] in Orthopedics to see the patient.  The left knee was tapped.  This revealed evidence of Gram- positive cocci and clusters.  The next day, an MRI was repeated, which showed significant enlargement of epidural fluid collection.  It was presumed that this was probably infected and on Dec 14, 2010, the patient went to the operating room where he had drainage of his epidural abscess and initial debridement of his infected left total knee prosthesis.  He was started on vancomycin.  Subsequently, the bacteria was found to be methicillin-sensitive, and he was changed to Ancef and rifampin.  He was seen in consultation by Infectious Disease who recommended a 42-day course of rifampin and Ancef, and he has currently completed 22 days.  On December 16, 2010, the patient returned to the operating room for a more thorough debridement and exchange of parts in the total knee replacement as well as implantation of antibiotic beads.He has been able to bend the knee somewhat, but has really not been doing much in the way of ambulation.  He did also have to return to the operating room on December 22, 2010, due to reaccumulation of fluid and  slow resolution of fever and white count.  More fluid was drained, but no infection was found at that time.  The rest of the hospital course has been characterized by an episode of mild-to-moderate Clostridium difficile colitis.  This has been treated with 14 days of vancomycin.  He is no longer on treatment for this. Diarrhea has resolved.  He has also developed sacral decubitus, which is being evaluated on the day of discharge by the wound ostomy nurses and recommendations for.  Care of this will be added to the bottom of this note.  Prior to admission, he suffered for malnutrition because of his poor p.o. intake secondary to his dental procedures.  This, of course, was  made much worse with his sepsis, and his albumin fell as low as 1.5. At discharge, he is begriming to eat better, but his dentures do not fit in his mouth, and so he is having some trouble eating certain foods.  He is had to eat a soft diet and is using some supplements.  He has had a urinary catheter in place and has failed two attempts at voiding.  At this time, Urology has recommended that we simply continue the Foley catheter until he is recovering better from his illness and then voiding attempts could be tried once more.  Finally in regard to sleep apnea, he did intermittently use a CPAP.  He has had some confusion at times.  He probably has a little bit of mild underlying dementia and at his age, he had some mild delirium during this hospitalization, but is usually able to be read oriented and is not combative for anything like that.  At this time, he is being transferred to a skilled nursing facility for continued IV antibiotics for the next 3 weeks as well as rehab to try to get him back up on his feet.  He needs to see Dr. Lovell Sheehan in approximately 1 week post discharge as well as [name].  It is notable that 2 days prior to discharge we checked sedimentation rate to get a baseline of where we were and results showed a sedimentation rate of 77, and a CRP was elevated a 17.  In any case, there is still some inflammation on board, but everything seems to be suppressed at this point.  It is unclear whether the patient is going to need long-term oral antibiotic suppression or not.  DISCHARGE MEDICATIONS: 1. Cefazolin 2 g twice daily intravenously x20 days. 2. Docusate sodium 100 mg twice daily. 3. Polyethylene glycol (MiraLax) 17 g twice daily.  May need to be     adjusted depending on stool output. 4. Rifampin 300 mg twice daily. 5. Flomax 0.4 mg daily. 6. Ambien 5 mg at bedtime p.r.n. 7. Advair 250/50 one puff twice daily. 8. Aspirin 81 mg daily. 9. Calcium carbonate 600 mg  daily. 10.Combivent 1-2 puffs q.6 h. p.r.n. shortness of breath. 11.Doxazosin 4 mg twice daily. 12.Fish oil 1 tablet daily. 13.Flaxseed oil 1 tablet daily. 14.Hydrocodone 5/500 q.i.d. p.r.n. 15.Meclizine 25 mg q.i.d. p.r.n. vertigo. 16.Potassium chloride 20 mEq daily. 17.Ropinirole 1 mg daily. 18.Simvastatin 40 mg daily. 19.Singulair 10 mg daily. 20.Wellbutrin SR 100 mg b.i.d.  ACTIVITY:  Per PT, increasing activity with weightbearing possible on the left leg.  WOUND CARE:  As described below.  DIET:  Mechanically soft, supplements recommended.     Theressa Millard, M.D.     JO/MEDQ  D:  01/04/2011  T:  01/04/2011  Job:  [id number]  cc:  Cristi Loron, M.D. Burnard Bunting, M.D.  Electronically Signed by Theressa Millard M.D. on 01/06/2011 09:31:47 AM

## 2011-01-09 LAB — POCT I-STAT 7, (LYTES, BLD GAS, ICA,H+H)
Acid-base deficit: 5 mmol/L — ABNORMAL HIGH (ref 0.0–2.0)
Bicarbonate: 22.9 mEq/L (ref 20.0–24.0)
Calcium, Ion: 1.22 mmol/L (ref 1.12–1.32)
HCT: 27 % — ABNORMAL LOW (ref 39.0–52.0)
Hemoglobin: 9.2 g/dL — ABNORMAL LOW (ref 13.0–17.0)
O2 Saturation: 100 % (ref 90.0–100.0)
Potassium: 3.7 mEq/L (ref 3.5–5.1)
Sodium: 140 mEq/L (ref 135–145)
TCO2: 25 mmol/L (ref 0–100)
pCO2 arterial: 53.9 mmHg — ABNORMAL HIGH (ref 35.0–45.0)
pH, Arterial: 7.236 — ABNORMAL LOW (ref 7.350–7.400)
pO2, Arterial: 347 mmHg — ABNORMAL HIGH (ref 80.0–100.0)

## 2011-02-01 ENCOUNTER — Ambulatory Visit: Payer: Medicare Other | Admitting: Infectious Diseases

## 2011-02-03 ENCOUNTER — Encounter: Payer: Self-pay | Admitting: Infectious Diseases

## 2011-02-03 ENCOUNTER — Ambulatory Visit (INDEPENDENT_AMBULATORY_CARE_PROVIDER_SITE_OTHER): Payer: Medicare Other | Admitting: Infectious Diseases

## 2011-02-03 DIAGNOSIS — M86169 Other acute osteomyelitis, unspecified tibia and fibula: Secondary | ICD-10-CM

## 2011-02-03 DIAGNOSIS — G061 Intraspinal abscess and granuloma: Secondary | ICD-10-CM

## 2011-02-03 MED ORDER — CEPHALEXIN 500 MG PO CAPS: 500.0000 mg | ORAL_CAPSULE | Freq: Two times a day (BID) | ORAL | Status: AC

## 2011-02-03 NOTE — Assessment & Plan Note (Signed)
He appears to be doing very well. Will pull his PIC today and will change him to suppressive po anbx (kelfex). The only concern is his elevated ESR. Also some concern due to his hx of C diff. Will not check his labs today as they were recently done at [facility]. Will see him back in 3-4 months.

## 2011-02-03 NOTE — Assessment & Plan Note (Signed)
He appears to be doing very well. Will pull his PIC today and will change him to suppressive po anbx (kelfex). The only concern is his elevated ESR. Also some concern due to his hx of C diff. Will see him back in 3-4 months.

## 2011-02-03 NOTE — Progress Notes (Signed)
  Subjective:    Patient ID: Micheal Frank, male    DOB: [birth year], 77 y.o.   MRN: [id number]  HPI 77 yo M with hx of failure to thrive beginning roughly Feb 2012. He was ultimately admitted on 5-29 due to this as well as increasing pain in his legs. He was found to have an epidural abscess as well as an infected L TKR. Both of which grew MSSA. He underwent drainage of both of these on 12-14-10. He was intially treated with Vanco and rifampin but was changed to Ancef as Cx were available. He returned to the OR for his back on 6-7 due to reaccumulation of fluid. He also returned to OR for his knee on 6-1 for exhange of poly and placement of anbx beads. His course was also complicated by C diff, a sacral decubitus ulcer and malnutrition. His ESR was 77 and CRP was 17.  He was d/c 01-04-11 to SNF and he completed his IV anbx on 01-23-11.  Today he feels fine. States his sacral decub has healed.  Still having difficulty with balance and strength in his legs. No fevers or chills. Still has diaper and foley catheter. Having runny BM. Eating well.  He had repeat C diff on 6-29 negative.  He had ESR 65 on 01-27-11 and WBC of 8,800 on 02-01-11. Pt denies family Hx of illnesses (has grandson who is handicap is only thing he mentions).   Review of Systems     Objective:   Physical Exam  Constitutional: He appears well-developed and well-nourished.  Eyes: EOM are normal.       L pupil > R, previous cataract surgery.   Neck: Neck supple.  Cardiovascular: Normal rate and regular rhythm.        distant  Pulmonary/Chest: Effort normal and breath sounds normal.  Abdominal: Soft. Bowel sounds are normal. He exhibits no distension. There is no tenderness.  Musculoskeletal:       Back:       Arms:      Legs:         Assessment & Plan:

## 2011-03-16 ENCOUNTER — Inpatient Hospital Stay (HOSPITAL_COMMUNITY)
Admission: AD | Admit: 2011-03-16 | Discharge: 2011-03-30 | DRG: 465 | Disposition: A | Discharge status: Skilled Nursing Facility | Payer: Medicare Other | Source: Ambulatory Visit | Attending: Orthopedic Surgery | Admitting: Orthopedic Surgery

## 2011-03-16 DIAGNOSIS — G4733 Obstructive sleep apnea (adult) (pediatric): Secondary | ICD-10-CM | POA: Diagnosis present

## 2011-03-16 DIAGNOSIS — K219 Gastro-esophageal reflux disease without esophagitis: Secondary | ICD-10-CM | POA: Diagnosis present

## 2011-03-16 DIAGNOSIS — Y9210 Unspecified residential institution as the place of occurrence of the external cause: Secondary | ICD-10-CM | POA: Diagnosis present

## 2011-03-16 DIAGNOSIS — Z96659 Presence of unspecified artificial knee joint: Secondary | ICD-10-CM

## 2011-03-16 DIAGNOSIS — G2581 Restless legs syndrome: Secondary | ICD-10-CM | POA: Diagnosis present

## 2011-03-16 DIAGNOSIS — J4489 Other specified chronic obstructive pulmonary disease: Secondary | ICD-10-CM | POA: Diagnosis present

## 2011-03-16 DIAGNOSIS — Y831 Surgical operation with implant of artificial internal device as the cause of abnormal reaction of the patient, or of later complication, without mention of misadventure at the time of the procedure: Secondary | ICD-10-CM | POA: Diagnosis present

## 2011-03-16 DIAGNOSIS — L8992 Pressure ulcer of unspecified site, stage 2: Secondary | ICD-10-CM | POA: Diagnosis present

## 2011-03-16 DIAGNOSIS — J449 Chronic obstructive pulmonary disease, unspecified: Secondary | ICD-10-CM | POA: Diagnosis present

## 2011-03-16 DIAGNOSIS — L89309 Pressure ulcer of unspecified buttock, unspecified stage: Secondary | ICD-10-CM | POA: Diagnosis present

## 2011-03-16 DIAGNOSIS — A4901 Methicillin susceptible Staphylococcus aureus infection, unspecified site: Secondary | ICD-10-CM | POA: Diagnosis present

## 2011-03-16 DIAGNOSIS — N400 Enlarged prostate without lower urinary tract symptoms: Secondary | ICD-10-CM | POA: Diagnosis present

## 2011-03-16 DIAGNOSIS — T8450XA Infection and inflammatory reaction due to unspecified internal joint prosthesis, initial encounter: Principal | ICD-10-CM | POA: Diagnosis present

## 2011-03-16 LAB — DIFFERENTIAL
Eosinophils Relative: 2 % (ref 0–5)
Lymphocytes Relative: 13 % (ref 12–46)
Lymphs Abs: 1.8 10*3/uL (ref 0.7–4.0)
Monocytes Absolute: 1.4 10*3/uL — ABNORMAL HIGH (ref 0.1–1.0)
Monocytes Relative: 10 % (ref 3–12)
Neutro Abs: 10.7 10*3/uL — ABNORMAL HIGH (ref 1.7–7.7)

## 2011-03-16 LAB — CBC
MCV: 93.5 fL (ref 78.0–100.0)
Platelets: 425 10*3/uL — ABNORMAL HIGH (ref 150–400)
RDW: 14.1 % (ref 11.5–15.5)
WBC: 14.1 10*3/uL — ABNORMAL HIGH (ref 4.0–10.5)

## 2011-03-16 LAB — BASIC METABOLIC PANEL
GFR calc Af Amer: 56 mL/min — ABNORMAL LOW (ref >60)
GFR calc non Af Amer: 46 mL/min — ABNORMAL LOW (ref >60)
Glucose, Bld: 76 mg/dL (ref 70–99)
Potassium: 3.6 mEq/L (ref 3.5–5.1)
Sodium: 138 mEq/L (ref 135–145)

## 2011-03-16 LAB — URINALYSIS, ROUTINE W REFLEX MICROSCOPIC
Glucose, UA: NEGATIVE mg/dL
Ketones, ur: NEGATIVE mg/dL
Protein, ur: 100 mg/dL — AB

## 2011-03-16 LAB — TYPE AND SCREEN: ABO/RH(D): A POS

## 2011-03-16 LAB — SURGICAL PCR SCREEN: Staphylococcus aureus: NEGATIVE

## 2011-03-16 LAB — PROTIME-INR
INR: 1.06 (ref 0.00–1.49)
Prothrombin Time: 14 seconds (ref 11.6–15.2)

## 2011-03-19 LAB — BASIC METABOLIC PANEL
Calcium: 9.3 mg/dL (ref 8.4–10.5)
GFR calc non Af Amer: 44 mL/min — ABNORMAL LOW (ref >60)
Sodium: 142 mEq/L (ref 135–145)

## 2011-03-20 ENCOUNTER — Inpatient Hospital Stay (HOSPITAL_COMMUNITY): Payer: Medicare Other

## 2011-03-20 DIAGNOSIS — T8450XA Infection and inflammatory reaction due to unspecified internal joint prosthesis, initial encounter: Secondary | ICD-10-CM

## 2011-03-20 LAB — BODY FLUID CULTURE

## 2011-03-21 ENCOUNTER — Inpatient Hospital Stay (HOSPITAL_COMMUNITY): Payer: Medicare Other

## 2011-03-21 ENCOUNTER — Inpatient Hospital Stay (HOSPITAL_COMMUNITY): Admission: RE | Admit: 2011-03-21 | Payer: Medicare Other | Source: Ambulatory Visit | Admitting: Orthopedic Surgery

## 2011-03-21 DIAGNOSIS — T8450XA Infection and inflammatory reaction due to unspecified internal joint prosthesis, initial encounter: Secondary | ICD-10-CM | POA: Insufficient documentation

## 2011-03-21 LAB — BASIC METABOLIC PANEL
BUN: 23 mg/dL (ref 6–23)
CO2: 28 mEq/L (ref 19–32)
Calcium: 8.8 mg/dL (ref 8.4–10.5)
Creatinine, Ser: 1.35 mg/dL (ref 0.50–1.35)
GFR calc Af Amer: >60 mL/min (ref >60)

## 2011-03-21 LAB — CBC
MCH: 30.4 pg (ref 26.0–34.0)
MCHC: 32.2 g/dL (ref 30.0–36.0)
Platelets: 350 10*3/uL (ref 150–400)

## 2011-03-21 LAB — TYPE AND SCREEN
ABO/RH(D): A POS
Antibody Screen: NEGATIVE

## 2011-03-21 LAB — ALBUMIN: Albumin: 2.2 g/dL — ABNORMAL LOW (ref 3.5–5.2)

## 2011-03-22 LAB — ANAEROBIC CULTURE

## 2011-03-22 LAB — CBC
MCHC: 31.8 g/dL (ref 30.0–36.0)
MCV: 95.7 fL (ref 78.0–100.0)
Platelets: 298 10*3/uL (ref 150–400)
RDW: 14.1 % (ref 11.5–15.5)
WBC: 16.1 10*3/uL — ABNORMAL HIGH (ref 4.0–10.5)

## 2011-03-22 LAB — BASIC METABOLIC PANEL
Calcium: 10 mg/dL (ref 8.4–10.5)
GFR calc Af Amer: 60 mL/min — ABNORMAL LOW (ref >60)
GFR calc non Af Amer: 49 mL/min — ABNORMAL LOW (ref >60)
Glucose, Bld: 85 mg/dL (ref 70–99)
Potassium: 3.6 mEq/L (ref 3.5–5.1)
Sodium: 143 mEq/L (ref 135–145)

## 2011-03-23 LAB — CBC
Hemoglobin: 10.5 g/dL — ABNORMAL LOW (ref 13.0–17.0)
MCH: 30.7 pg (ref 26.0–34.0)
Platelets: 240 10*3/uL (ref 150–400)
RBC: 3.42 MIL/uL — ABNORMAL LOW (ref 4.22–5.81)
WBC: 16.3 10*3/uL — ABNORMAL HIGH (ref 4.0–10.5)

## 2011-03-23 LAB — BASIC METABOLIC PANEL
Chloride: 105 mEq/L (ref 96–112)
GFR calc Af Amer: >60 mL/min (ref >60)
GFR calc non Af Amer: 51 mL/min — ABNORMAL LOW (ref >60)
Potassium: 3.7 mEq/L (ref 3.5–5.1)
Sodium: 142 mEq/L (ref 135–145)

## 2011-03-23 LAB — PROTIME-INR
INR: 1.64 — ABNORMAL HIGH (ref 0.00–1.49)
Prothrombin Time: 19.7 seconds — ABNORMAL HIGH (ref 11.6–15.2)

## 2011-03-24 LAB — URINE CULTURE

## 2011-03-24 LAB — URINALYSIS, ROUTINE W REFLEX MICROSCOPIC
Glucose, UA: NEGATIVE mg/dL
Protein, ur: 30 mg/dL — AB
pH: 6 (ref 5.0–8.0)

## 2011-03-24 LAB — COMPREHENSIVE METABOLIC PANEL
AST: 23 U/L (ref 0–37)
CO2: 30 mEq/L (ref 19–32)
Calcium: 10.8 mg/dL — ABNORMAL HIGH (ref 8.4–10.5)
Creatinine, Ser: 1.31 mg/dL (ref 0.50–1.35)
GFR calc Af Amer: >60 mL/min (ref >60)
GFR calc non Af Amer: 52 mL/min — ABNORMAL LOW (ref >60)

## 2011-03-24 LAB — URINE MICROSCOPIC-ADD ON

## 2011-03-24 LAB — BASIC METABOLIC PANEL
CO2: 29 mEq/L (ref 19–32)
Chloride: 106 mEq/L (ref 96–112)
Sodium: 143 mEq/L (ref 135–145)

## 2011-03-24 LAB — CBC
HCT: 30.9 % — ABNORMAL LOW (ref 39.0–52.0)
Hemoglobin: 10.1 g/dL — ABNORMAL LOW (ref 13.0–17.0)
WBC: 15.2 10*3/uL — ABNORMAL HIGH (ref 4.0–10.5)

## 2011-03-24 LAB — PROTIME-INR
INR: 1.77 — ABNORMAL HIGH (ref 0.00–1.49)
Prothrombin Time: 20.9 seconds — ABNORMAL HIGH (ref 11.6–15.2)

## 2011-03-25 ENCOUNTER — Inpatient Hospital Stay (HOSPITAL_COMMUNITY): Payer: Medicare Other

## 2011-03-25 LAB — BASIC METABOLIC PANEL
CO2: 30 mEq/L (ref 19–32)
Calcium: 11.1 mg/dL — ABNORMAL HIGH (ref 8.4–10.5)
GFR calc Af Amer: >60 mL/min (ref >60)
GFR calc non Af Amer: 53 mL/min — ABNORMAL LOW (ref >60)
Sodium: 142 mEq/L (ref 135–145)

## 2011-03-25 LAB — CBC
HCT: 33 % — ABNORMAL LOW (ref 39.0–52.0)
Hemoglobin: 10.8 g/dL — ABNORMAL LOW (ref 13.0–17.0)
MCH: 30.8 pg (ref 26.0–34.0)
MCV: 94 fL (ref 78.0–100.0)
RBC: 3.51 MIL/uL — ABNORMAL LOW (ref 4.22–5.81)

## 2011-03-25 LAB — PROTIME-INR
INR: 2.12 — ABNORMAL HIGH (ref 0.00–1.49)
Prothrombin Time: 24.1 seconds — ABNORMAL HIGH (ref 11.6–15.2)

## 2011-03-26 LAB — PREPARE FRESH FROZEN PLASMA
Unit division: 0
Unit division: 0

## 2011-03-26 LAB — PROTIME-INR
INR: 1.63 — ABNORMAL HIGH (ref 0.00–1.49)
INR: 1.32 (ref 0.00–1.49)
Prothrombin Time: 19.6 seconds — ABNORMAL HIGH (ref 11.6–15.2)
Prothrombin Time: 16.6 seconds — ABNORMAL HIGH (ref 11.6–15.2)

## 2011-03-26 LAB — CBC
HCT: 30.7 % — ABNORMAL LOW (ref 39.0–52.0)
Hemoglobin: 10 g/dL — ABNORMAL LOW (ref 13.0–17.0)
MCHC: 32.6 g/dL (ref 30.0–36.0)
RBC: 3.27 MIL/uL — ABNORMAL LOW (ref 4.22–5.81)

## 2011-03-26 LAB — BASIC METABOLIC PANEL
BUN: 27 mg/dL — ABNORMAL HIGH (ref 6–23)
CO2: 31 mEq/L (ref 19–32)
Calcium: 11.1 mg/dL — ABNORMAL HIGH (ref 8.4–10.5)
Creatinine, Ser: 1.31 mg/dL (ref 0.50–1.35)
GFR calc Af Amer: >60 mL/min (ref >60)

## 2011-03-27 DIAGNOSIS — T8450XA Infection and inflammatory reaction due to unspecified internal joint prosthesis, initial encounter: Secondary | ICD-10-CM

## 2011-03-27 LAB — CBC
HCT: 30.8 % — ABNORMAL LOW (ref 39.0–52.0)
Hemoglobin: 9.8 g/dL — ABNORMAL LOW (ref 13.0–17.0)
MCHC: 31.8 g/dL (ref 30.0–36.0)
RBC: 3.25 MIL/uL — ABNORMAL LOW (ref 4.22–5.81)
WBC: 11.1 10*3/uL — ABNORMAL HIGH (ref 4.0–10.5)

## 2011-03-27 LAB — BASIC METABOLIC PANEL
CO2: 31 mEq/L (ref 19–32)
Chloride: 106 mEq/L (ref 96–112)
Creatinine, Ser: 1.24 mg/dL (ref 0.50–1.35)
GFR calc Af Amer: >60 mL/min (ref >60)
Potassium: 3.2 mEq/L — ABNORMAL LOW (ref 3.5–5.1)
Sodium: 142 mEq/L (ref 135–145)

## 2011-03-27 LAB — URINE CULTURE

## 2011-03-27 LAB — PREPARE FRESH FROZEN PLASMA: Unit division: 0

## 2011-03-27 LAB — PROTIME-INR
INR: 1.27 (ref 0.00–1.49)
Prothrombin Time: 16.2 seconds — ABNORMAL HIGH (ref 11.6–15.2)

## 2011-03-28 LAB — BASIC METABOLIC PANEL
BUN: 30 mg/dL — ABNORMAL HIGH (ref 6–23)
Chloride: 109 mEq/L (ref 96–112)
GFR calc Af Amer: 59 mL/min — ABNORMAL LOW (ref >60)
GFR calc non Af Amer: 48 mL/min — ABNORMAL LOW (ref >60)
Potassium: 3.4 mEq/L — ABNORMAL LOW (ref 3.5–5.1)
Sodium: 145 mEq/L (ref 135–145)

## 2011-03-28 LAB — PROTIME-INR
INR: 1.14 (ref 0.00–1.49)
Prothrombin Time: 14.8 seconds (ref 11.6–15.2)

## 2011-03-29 ENCOUNTER — Inpatient Hospital Stay (HOSPITAL_COMMUNITY): Payer: Medicare Other

## 2011-03-29 LAB — BASIC METABOLIC PANEL
Calcium: 9.9 mg/dL (ref 8.4–10.5)
GFR calc Af Amer: 58 mL/min — ABNORMAL LOW (ref >60)
GFR calc non Af Amer: 48 mL/min — ABNORMAL LOW (ref >60)
Glucose, Bld: 86 mg/dL (ref 70–99)
Potassium: 3.3 mEq/L — ABNORMAL LOW (ref 3.5–5.1)
Sodium: 143 mEq/L (ref 135–145)

## 2011-03-29 LAB — PROTIME-INR: Prothrombin Time: 14.7 seconds (ref 11.6–15.2)

## 2011-03-29 NOTE — Op Note (Signed)
  NAME:  GALE, HULSE NO.:  [id number]  MEDICAL RECORD NO.:  [id number]  LOCATION:  5037                         FACILITY:  [facility]  PHYSICIAN:  Burnard Bunting, M.D.    DATE OF BIRTH:  26-Mar-1926  DATE OF PROCEDURE:  03/17/2011 DATE OF DISCHARGE:                              OPERATIVE REPORT   PREOPERATIVE DIAGNOSIS:  Left total knee infection.  POSTOPERATIVE DIAGNOSIS:  Left total knee infection.  PROCEDURE:  Left total knee arthroscopic washout.  SURGEON:  Burnard Bunting, MD  ASSISTANT:  None.  ANESTHESIA:  General endotracheal.  INDICATION:  This is an 77 year old patient with left knee infection after the total joint knee replacement presents now for arthroscopic washout temporizing measured prior to TKA extraction and revision.  The patient did have staph sepsis upon presentation 3 months ago.  PROCEDURE IN DETAIL:  The patient brought to the operating room where general endotracheal anesthesia was used.  Preoperative antibiotics were held until cultures were obtained.  Time-out was called.  Left leg was prepped with Hibiclens, draped in sterile manner, 2 portals created superolateral, superomedial, 9 L of irrigating solution and debridement was performed in the suprapatellar pouch and throughout the knee. Hemovac drain was placed.  Portals were closed using 3-0 nylon.  The patient tolerated the procedure well without immediate complications. He was transferred to recovery room in stable condition.     Burnard Bunting, M.D.     GSD/MEDQ  D:  03/17/2011  T:  03/17/2011  Job:  [id number]  Electronically Signed by Reece Agar.  [name] M.D. on 03/29/2011 08:37:18 AM

## 2011-03-29 NOTE — Op Note (Signed)
  NAME:  Micheal, Frank NO.:  [id number]  MEDICAL RECORD NO.:  [id number]  LOCATION:  DAHO                         FACILITY:  [facility]  PHYSICIAN:  Burnard Bunting, M.D.    DATE OF BIRTH:  Jan 08, 1926  DATE OF PROCEDURE:  03/21/2011 DATE OF DISCHARGE:                              OPERATIVE REPORT   PREOPERATIVE DIAGNOSIS:  Left total knee arthroplasty infection.  POSTOPERATIVE DIAGNOSIS:  Left total knee arthroplasty infection.  PROCEDURE:  Left total knee arthroplasty prosthetic removal with placement of antibiotic spacer.  SURGEON:  Burnard Bunting, MD  ASSISTANT:  None.  ANESTHESIA:  General endotracheal.  ESTIMATED BLOOD LOSS:  100 mL.  DRAINS:  None.  TOURNIQUET TIME:  One hour and 55 minutes at 250 mmHg.  INDICATIONS:  Micheal Frank is an 77 year old patient with left total knee prosthetic infection who presents for operative management.  PROCEDURE IN DETAIL:  The patient was brought to the operating room where general endotracheal anesthesia was induced.  Perioperative IV antibiotics were maintained.  A time-out was called.  Left leg was prepped with pre-scrubbed Hibiclens with alcohol and Betadine and DuraPrep solution and draped in a sterile manner.  Micheal Frank was used to cover the operative field.  Leg was elevated.  Tourniquet was inflated to 250 mmHg.  Anterior approach of knee was used.  Skin and subcutaneous tissue were sharply divided.  Medial parapatellar approach was made. Patella was subluxated laterally.  Care was taken to avoid avulsion of the tibial tubercle.  Gross infection was present.  At this time, polyethylene spacer was removed.  The femur was then removed by first sowing at the cement prosthetic interface and then using wedge osteotomes and femoral extractor.  Relatively, clean excision was made. At this time in a similar fashion, the tibial component was removed. The tibial component required more effort in order to remove,  however, it did come out.  Excess cement was removed from the distal femur and distal tibia.  Complete synovectomy anterior and posterior was performed.  Care being taken to avoid injury to posterior neurovascular structures.  At this time, 9 liters of pulsatile irrigation were performed including the canal which was curetted.  Following this, a total of 4 g of vancomycin, approximately 3 g of tobramycin, and 180 mg of gentamicin were then impregnated into absorbable cement beads as well as cement spacer.  Spacer was then placed first on the femur, tibia with 22.5 poly insert.  This gave the patient enough stability that he could ambulate.  Cement was allowed to harden.  Resorbable antibiotic beads were then implanted in the gutters and posteriorly, but not in the joint space.  Tourniquet was released.  Bleeding points were encountered and controlled with electrocautery.  Arthrotomy was closed using a #1 Vicryl suture, 2-0 Vicryl suture, and skin staples.  The patient tolerated the procedure well without immediate complication.  Leg was perfused at the conclusion of the case.     Burnard Bunting, M.D.     GSD/MEDQ  D:  03/21/2011  T:  03/21/2011  Job:  [id number]  Electronically Signed by Reece Agar.  Mounir Skipper M.D. on 03/29/2011 08:37:21 AM

## 2011-03-30 DIAGNOSIS — S065X9A Traumatic subdural hemorrhage with loss of consciousness of unspecified duration, initial encounter: Secondary | ICD-10-CM

## 2011-03-30 DIAGNOSIS — S065XAA Traumatic subdural hemorrhage with loss of consciousness status unknown, initial encounter: Secondary | ICD-10-CM

## 2011-03-30 HISTORY — DX: Traumatic subdural hemorrhage with loss of consciousness of unspecified duration, initial encounter: S06.5X9A

## 2011-03-30 HISTORY — DX: Traumatic subdural hemorrhage with loss of consciousness status unknown, initial encounter: S06.5XAA

## 2011-03-30 LAB — BASIC METABOLIC PANEL
Chloride: 108 mEq/L (ref 96–112)
GFR calc Af Amer: >60 mL/min (ref >60)
GFR calc non Af Amer: 53 mL/min — ABNORMAL LOW (ref >60)
Potassium: 3.3 mEq/L — ABNORMAL LOW (ref 3.5–5.1)
Sodium: 143 mEq/L (ref 135–145)

## 2011-03-30 LAB — PROTIME-INR: INR: 1.13 (ref 0.00–1.49)

## 2011-03-31 NOTE — Consult Note (Signed)
NAME:  GURSHAAN, MATSUOKA NO.:  [id number]  MEDICAL RECORD NO.:  [id number]  LOCATION:  DAHO                         FACILITY:  [facility]  PHYSICIAN:  Hewitt Shorts, M.D.DATE OF BIRTH:  06-03-1926  DATE OF CONSULTATION: DATE OF DISCHARGE:                                CONSULTATION   HISTORY OF PRESENT ILLNESS:  The patient is an 77 year old right hand white male, who has been having significant medical difficulties for the past several months.  In May of this year he presented with increasing low back pain and discomfort in the lower extremities, but also with specific left knee pain.  He was found to have epidural abscess, underwent laminectomy and evacuation of debridements by Dr. Delma Officer, unfortunately it recurred and required reoperation by Dr. Lovell Sheehan.  He was also found to have a septic left knee.  He had had a previous total knee replacement and he underwent several debridements by Dr. Dorene Grebe, however, because of persistent infections he was readmitted by [name] and underwent removal of the total knee replacement prosthesis on March 21, 2011 and an antibiotic spacer was placed.  Postoperatively, the patient was placed on Coumadin for VTE prophylaxis and his laboratories this morning showed a PT of 24 and INR of 2.12.  Today, the patient's family noticed that he was less responsive.  He had increased clumsiness when feeding myself and he had slurred speech.  Dr. Allie Bossier, who was covering for [name], was contacted, he ordered a CT scan of the head.  The patient did have a previous CT in June of this year and the studies were compared.  Today study showed increasing right frontal subdural collection with increased mass effect on the right frontal lobe as well as increased density of the collection.  The appearance in June was that of a subdural hygroma and the appearance now is of a subdural hygromas and had some bleeding  into its.  Dr. Magnus Ivan, requested neurosurgical consultation and we discussed the case, I recommended reversal of anticoagulation and Dr. Magnus Ivan, ordered transfusion of 2 units fresh frozen plasma as well as vitamin K 10 mg p.o. and previously had discontinued his Coumadin.  PAST MEDICAL HISTORY:  Notable for a number of medical conditions including Meniere disease, melanoma, COPD, hypercholesterolemia, and obstructive sleep apnea.  PAST SURGICAL HISTORY:  Previous surgery includes his left total knee replacement, bilateral cataract surgery, the lumbar laminectomies x2, and the multiple debridements of the left knee over the past several months as well as removal of the left knee prosthesis 4 days ago.  He has no allergies to medications.  MEDICATIONS TIME OF ADMISSION:  VESIcare, diazepam, calcium carbonate, Keflex, Advair Diskus, bupropion, aspirin, Klor-Con, flaxseed oil, doxazosin, fish oil, Ensure, tamsulosin, simvastatin, ropinirole, ______ MiraLax, Tylenol, zolpidem, meclizine, hydrocodone, and Combivent inhaler.  FAMILY HISTORY:  Parents passed on.  SOCIAL HISTORY:  The patient is married.  He lives in [place].  He is retired.  He quit smoking many years ago.  He does not drink also beverages.  REVIEW OF SYSTEMS:  Notable for difficulties to describe his history of present illness and past medical history.  Denies any headache.  PHYSICAL EXAMINATION:  GENERAL:  The patient is a well-developed, well- nourished, elderly white male in no distress. VITAL SIGNS:  Temperature is 98.7, pulse 76, blood pressure 127/67. MENTAL STATUS:  The patient is awake and alert.  He is oriented to his name, [facility], [place], 2012.  He follows commands.  Cranial nerves show the left pupil is 8 mm round and somewhat weakly reactive to light.  The right pupil is 6 mm round and more reactive to light.  His family notes that he is status post bilateral cataract surgery and  since the cataract surgery has had anisocoria with the left pupil being larger the right pupil.  Extraocular movements are intact.  Facial movement is symmetrical.  Tongue is midline.  Motor examination shows 5/5 strength in the upper and lower extremities, although the left lower extremity is not fully a valuable because of his knee immobilizer.  He has no drift to the upper extremities.  Sensation is intact.  IMPRESSION: 1. Altered mental status secondary to bleeding into the right frontal     subdural collection, previously hygroma and now with high-density     and increased mass effect. 2. Therapeutic anticoagulation, on Coumadin.  RECOMMENDATIONS:  I spoke with the patient and his two daughters as well as his son-in-law as well as with Dr. Allie Bossier, and I have recommended reversal of his anticoagulation which has been initiated by Dr. Magnus Ivan, with 2 units of fresh frozen plasma and vitamin K 10 mg. We will plan on rechecking his PT/INR after the fresh frozen plasma and repeat fresh frozen plasma if the INR remains greater than 1.5.  We will plan to repeat a CT scan of his head later this week or sooner if he has any neurologic decline, Neuro checks have been requested from the nursing staff.  The patient and his family's questions were answered for them.     Hewitt Shorts, M.D.     RWN/MEDQ  D:  03/25/2011  T:  03/26/2011  Job:  [id number]  Electronically Signed by Shirlean Kelly M.D. on 03/31/2011 05:17:35 PM

## 2011-04-10 ENCOUNTER — Other Ambulatory Visit (HOSPITAL_COMMUNITY): Payer: Self-pay | Admitting: Neurosurgery

## 2011-04-10 DIAGNOSIS — I619 Nontraumatic intracerebral hemorrhage, unspecified: Secondary | ICD-10-CM

## 2011-04-10 DIAGNOSIS — I609 Nontraumatic subarachnoid hemorrhage, unspecified: Secondary | ICD-10-CM

## 2011-04-11 ENCOUNTER — Observation Stay (HOSPITAL_COMMUNITY)
Admission: EM | Admit: 2011-04-11 | Discharge: 2011-04-13 | Disposition: A | Discharge status: Home / Self Care | Payer: Medicare Other | Source: Ambulatory Visit | Attending: Internal Medicine | Admitting: Internal Medicine

## 2011-04-11 DIAGNOSIS — G4733 Obstructive sleep apnea (adult) (pediatric): Secondary | ICD-10-CM | POA: Insufficient documentation

## 2011-04-11 DIAGNOSIS — M7989 Other specified soft tissue disorders: Principal | ICD-10-CM | POA: Insufficient documentation

## 2011-04-11 DIAGNOSIS — T8140XA Infection following a procedure, unspecified, initial encounter: Secondary | ICD-10-CM | POA: Insufficient documentation

## 2011-04-11 DIAGNOSIS — M79609 Pain in unspecified limb: Secondary | ICD-10-CM

## 2011-04-11 DIAGNOSIS — Y831 Surgical operation with implant of artificial internal device as the cause of abnormal reaction of the patient, or of later complication, without mention of misadventure at the time of the procedure: Secondary | ICD-10-CM | POA: Insufficient documentation

## 2011-04-11 DIAGNOSIS — J449 Chronic obstructive pulmonary disease, unspecified: Secondary | ICD-10-CM | POA: Insufficient documentation

## 2011-04-11 DIAGNOSIS — A4901 Methicillin susceptible Staphylococcus aureus infection, unspecified site: Secondary | ICD-10-CM | POA: Insufficient documentation

## 2011-04-11 DIAGNOSIS — J4489 Other specified chronic obstructive pulmonary disease: Secondary | ICD-10-CM | POA: Insufficient documentation

## 2011-04-11 LAB — POCT I-STAT, CHEM 8
BUN: 33 mg/dL — ABNORMAL HIGH (ref 6–23)
Calcium, Ion: 1.25 mmol/L (ref 1.12–1.32)
Chloride: 105 mEq/L (ref 96–112)
HCT: 31 % — ABNORMAL LOW (ref 39.0–52.0)
Potassium: 3.6 mEq/L (ref 3.5–5.1)
Sodium: 140 mEq/L (ref 135–145)

## 2011-04-11 LAB — DIFFERENTIAL
Eosinophils Absolute: 0.4 10*3/uL (ref 0.0–0.7)
Lymphocytes Relative: 18 % (ref 12–46)
Lymphs Abs: 1.3 10*3/uL (ref 0.7–4.0)
Neutro Abs: 4.6 10*3/uL (ref 1.7–7.7)
Neutrophils Relative %: 61 % (ref 43–77)

## 2011-04-11 LAB — CBC
Hemoglobin: 10 g/dL — ABNORMAL LOW (ref 13.0–17.0)
MCV: 94.8 fL (ref 78.0–100.0)
Platelets: 212 10*3/uL (ref 150–400)
RBC: 3.24 MIL/uL — ABNORMAL LOW (ref 4.22–5.81)
WBC: 7.5 10*3/uL (ref 4.0–10.5)

## 2011-04-11 LAB — PROTIME-INR
INR: 1.17 (ref 0.00–1.49)
Prothrombin Time: 15.1 seconds (ref 11.6–15.2)

## 2011-04-11 LAB — D-DIMER, QUANTITATIVE: D-Dimer, Quant: 1.83 ug/mL-FEU — ABNORMAL HIGH (ref 0.00–0.48)

## 2011-04-11 NOTE — H&P (Signed)
NAME:  Micheal Frank, Micheal Frank NO.:  [id number]  MEDICAL RECORD NO.:  [id number]  LOCATION:  3029                         FACILITY:  [facility]  PHYSICIAN:  De Libman L. Lendell Caprice, MDDATE OF BIRTH:  04-12-1926  DATE OF ADMISSION:  04/11/2011 DATE OF DISCHARGE:  LH                             HISTORY & PHYSICAL   CHIEF COMPLAINT:  Abnormal ultrasound of the leg showing DVT.  HISTORY OF PRESENT ILLNESS:  Mr. Barrell is a pleasant 77 year old white male who was sent to the emergency room from skilled nursing facility with an outpatient study showing a preliminary result of a small nonocclusive thrombus at the proximal left superficial femoral vein. This was a preliminary result and I see no final reading.  The patient has had a recent left knee arthroscopy wash out and total knee arthroplasty removal, and antibiotic spacer placement on March 21, 2011, by [name] for methicillin-sensitive staph aureus infection.  He was on Coumadin postoperatively for DVT prophylaxis.  Apparently, he had an episode of confusion and had a spontaneous right frontal subdural hematoma.  Apparently, previous CAT scan showed a hygroma, but a repeat CAT scan showed a bleed and mass effect.  The Coumadin was reversed and patient required no further intervention.  He has been recovering at skilled nursing facility and getting rehabilitation and IV Ancef. Apparently, the rounding physician there noted that his left knee seemed more swollen and ordered a Doppler.  It was positive, so patient was sent to the emergency room.  The patient was evaluated by ED physician, Dr. Brooke Dare who repeated the Doppler study.  Interestingly, the repeat Doppler done at 6:45 p.m. by our vascular lab shows no evidence of DVT, superficial thrombus, or Baker cyst on the left.  I have spoken with Dr. Mayford Knife, Radiology and she is unable to access the images.  The patient reports no pain more than what he had been experiencing.  He has  had no shortness of breath or chest pain.  I have just now found out about this repeat Doppler and the ED physician had written holding orders with the plan to probably putting in an IVC filter.  The vascular tech has left for the evening and I am unable to contact anyone about this repeat negative study.  PAST MEDICAL HISTORY: 1. Recent right frontal subdural hematoma. 2. Left knee methicillin-sensitive staph aureus infection, currently     continuing on IV Ancef. 3. History of epidural abscess, status post drainage in May and June     of this year, methicillin-sensitive staph aureus. 4. History of C. diff colitis. 5. History of urinary retention. 6. Obstructive sleep apnea on CPAP. 7. Benign prostatic hypertrophy. 8. History of neuropathy and restless legs syndrome. 9. History of melanoma. 10.COPD. 11.Lumbar radiculopathy. 12.Hyperlipidemia.  MEDICATIONS: 1. Advair 250/50 one puff b.i.d. 2. Aspirin 81 mg a day. 3. Bupropion SR 100 mg a day. 4. Calcium carbonate 600 mg daily. 5. Ancef 2 g every 8 hours. 6. Combivent inhaler 1-2 puffs every 6 hours as needed for wheezing. 7. Diazepam 5 mg daily. 8. Doxazosin 4 mg twice a day. 9. Ensure twice a day. 10.Flaxseed oil 1000 mg daily. 11.Vicodin one p.o. q.i.d. p.r.n.  pain. 12.Klor-Con 20 mEq a day. 13.Meclizine 25 mg p.o. q.i.d. p.r.n. 14.MiraLax 17 g a day. 15.Protein supplement twice a day. 16.Ropinirole 1 mg nightly. 17.Simvastatin 40 mg a day. 18.Flomax 0.4 mg a day. 19.Tylenol 650 mg p.o. q.6 hours p.r.n. 20.VESIcare 5 mg daily. 21.Zolpidem 5 mg nightly as needed for sleep.  No known drug allergies.  SOCIAL HISTORY:  He is here with his daughter whose name is Salome Holmes, her telephone number is [phone].  The patient does not drink and smoke.  He is currently at skilled nursing facility and is planned to be discharged this coming Friday.  FAMILY HISTORY:  Noncontributory.  SYSTEMS REVIEWED:  As above,  otherwise negative.  PHYSICAL EXAMINATION:  VITAL SIGNS:  Temperature is 98 degrees, blood pressure 132/74, pulse 71, respiratory rate 16, oxygen saturation 95% on room air. GENERAL:  The patient is an elderly hard-of-hearing quite male in no acute distress. HEENT:  Normocephalic, atraumatic.  Pupils equal, round, and reactive to light.  Sclerae nonicteric. MOUTH:  He does have some thrush, white plaques.  Moist mucous membranes. NECK:  Supple.  No lymphadenopathy. LUNGS:  Clear to auscultation bilaterally without wheezes, rhonchi, or rales. CARDIOVASCULAR:  Regular rate and rhythm without murmurs, gallops, or rubs. ABDOMEN:  Normal bowel sounds, soft, nontender, nondistended. GU AND RECTAL:  Deferred. EXTREMITIES:  He has some edema around his left knee.  There is no calf tenderness or edema about the calf.  The incision is healed.  There is no warmth. NEUROLOGIC:  Alert and oriented.  Hard of hearing, but cranial nerves are otherwise intact.  No focal deficits noted. SKIN:  No rash. PSYCHIATRIC:  The patient is calm and cooperative with a normal affect.  LABS:  CBC significant for a hemoglobin of 10, hematocrit 30, MCV 94, INR is 1.17.  Basic metabolic panel significant for a BUN of 33.  EKG shows sinus tachycardia with a rate of 112, low-voltage throughout.  ASSESSMENT AND PLAN: 1. Possible left leg deep vein thrombosis:  Unfortunately, we have two     studies with the conflicting results.  Both of these are     preliminary results.  The tech has already gone home and the     radiologist is unable to access images.  At this point, I will     check a D-dimer and if negative, it is unlikely to be a deep vein     thrombosis.  I would however recommend that the rounding physician     in the morning discuss the studies and potentially compare images     with Vascular tomorrow to see whether or not there is any evidence     of deep vein thrombosis.  He may need a venogram or other  if     results are still conflicting.  He would not be a good Coumadin     candidate due to spontaneous subdural hematoma on Coumadin a few     weeks ago.  If he does in fact have a deep vein thrombosis, filter     is the best option. 2. Methicillin-sensitive staph aureus infection of the knee:  The     patient still has his peripherally inserted central catheter line     in and I will continue Ancef. 3. Obstructive sleep apnea.  Continue continuous positive airway     pressure. 4. History of urinary retention.  Continue doxazosin and Flomax. 5. Chronic obstructive pulmonary disease, stable.     Nataliee Shurtz L. Lendell Caprice,  MD     CLS/MEDQ  D:  04/11/2011  T:  04/11/2011  Job:  [id number]  cc:   G. Dorene Grebe, M.D. Fax: [phone]  Hewitt Shorts, M.D. Fax: [phone]

## 2011-04-12 LAB — MRSA PCR SCREENING: MRSA by PCR: NEGATIVE

## 2011-04-13 LAB — BASIC METABOLIC PANEL
BUN: 12
BUN: 17
CO2: 28
Calcium: 8.2 — ABNORMAL LOW
Calcium: 8.5
Chloride: 104
Creatinine, Ser: 1.45
Creatinine, Ser: 1.44
Creatinine, Ser: 1.5
GFR calc Af Amer: 57 — ABNORMAL LOW
GFR calc Af Amer: 54 — ABNORMAL LOW
GFR calc Af Amer: 57 — ABNORMAL LOW
GFR calc non Af Amer: 47 — ABNORMAL LOW
GFR calc non Af Amer: 45 — ABNORMAL LOW
GFR calc non Af Amer: 47 — ABNORMAL LOW
Glucose, Bld: 121 — ABNORMAL HIGH
Glucose, Bld: 87
Potassium: 3.6
Sodium: 138

## 2011-04-13 LAB — CBC
HCT: 37.2 — ABNORMAL LOW
Hemoglobin: 12.4 — ABNORMAL LOW
Hemoglobin: 12.8 — ABNORMAL LOW
Hemoglobin: 13.1
MCHC: 35.3
MCV: 93.2
Platelets: 222
Platelets: 147 — ABNORMAL LOW
Platelets: 173
RBC: 3.7 — ABNORMAL LOW
RBC: 3.96 — ABNORMAL LOW
RBC: 3.98 — ABNORMAL LOW
RDW: 13.1
RDW: 13.4
WBC: 7.6
WBC: 10
WBC: 10.2

## 2011-04-13 LAB — PROTIME-INR
INR: 1.2
INR: 1.8 — ABNORMAL HIGH
Prothrombin Time: 13
Prothrombin Time: 18.7 — ABNORMAL HIGH

## 2011-04-13 LAB — URINALYSIS, ROUTINE W REFLEX MICROSCOPIC
Bilirubin Urine: NEGATIVE
Glucose, UA: NEGATIVE
Ketones, ur: NEGATIVE
Ketones, ur: NEGATIVE
Nitrite: NEGATIVE
Protein, ur: NEGATIVE
Protein, ur: NEGATIVE
Urobilinogen, UA: 1
pH: 8

## 2011-04-13 LAB — URINE CULTURE: Colony Count: 50000

## 2011-04-13 LAB — TYPE AND SCREEN: Antibody Screen: NEGATIVE

## 2011-04-13 LAB — CLOSTRIDIUM DIFFICILE EIA

## 2011-04-13 LAB — APTT: aPTT: 29

## 2011-04-17 ENCOUNTER — Ambulatory Visit (HOSPITAL_COMMUNITY)
Admission: RE | Admit: 2011-04-17 | Discharge: 2011-04-17 | Disposition: A | Discharge status: Home / Self Care | Payer: Medicare Other | Source: Ambulatory Visit | Attending: Neurosurgery | Admitting: Neurosurgery

## 2011-04-17 ENCOUNTER — Encounter (HOSPITAL_COMMUNITY): Payer: Self-pay

## 2011-04-17 DIAGNOSIS — I619 Nontraumatic intracerebral hemorrhage, unspecified: Secondary | ICD-10-CM

## 2011-04-17 DIAGNOSIS — I609 Nontraumatic subarachnoid hemorrhage, unspecified: Secondary | ICD-10-CM

## 2011-04-17 DIAGNOSIS — I6200 Nontraumatic subdural hemorrhage, unspecified: Secondary | ICD-10-CM | POA: Insufficient documentation

## 2011-04-17 DIAGNOSIS — R42 Dizziness and giddiness: Secondary | ICD-10-CM | POA: Insufficient documentation

## 2011-04-21 ENCOUNTER — Other Ambulatory Visit: Payer: Self-pay | Admitting: Orthopedic Surgery

## 2011-04-24 ENCOUNTER — Ambulatory Visit
Admission: RE | Admit: 2011-04-24 | Discharge: 2011-04-24 | Disposition: A | Discharge status: Home / Self Care | Payer: Medicare Other | Source: Ambulatory Visit | Attending: Orthopedic Surgery | Admitting: Orthopedic Surgery

## 2011-04-24 MED ORDER — GADOBENATE DIMEGLUMINE 529 MG/ML IV SOLN
7.0000 mL | Freq: Once | INTRAVENOUS | Status: AC | PRN
  Administered 2011-04-24: 7 mL via INTRAVENOUS

## 2011-04-26 ENCOUNTER — Ambulatory Visit (HOSPITAL_COMMUNITY)
Admission: RE | Admit: 2011-04-26 | Discharge: 2011-04-26 | Disposition: A | Discharge status: Home / Self Care | Payer: Medicare Other | Source: Ambulatory Visit | Attending: Infectious Disease | Admitting: Infectious Disease

## 2011-04-26 ENCOUNTER — Inpatient Hospital Stay (HOSPITAL_COMMUNITY)
Admission: AD | Admit: 2011-04-26 | Discharge: 2011-04-28 | DRG: 254 | Disposition: A | Discharge status: Home / Self Care | Payer: Medicare Other | Source: Ambulatory Visit | Attending: Internal Medicine | Admitting: Internal Medicine

## 2011-04-26 ENCOUNTER — Encounter: Payer: Self-pay | Admitting: Infectious Disease

## 2011-04-26 ENCOUNTER — Inpatient Hospital Stay (HOSPITAL_COMMUNITY): Payer: Medicare Other

## 2011-04-26 ENCOUNTER — Ambulatory Visit (INDEPENDENT_AMBULATORY_CARE_PROVIDER_SITE_OTHER): Payer: Medicare Other | Admitting: Infectious Disease

## 2011-04-26 VITALS — BP 106/65 | HR 93 | Temp 97.9°F

## 2011-04-26 DIAGNOSIS — G4733 Obstructive sleep apnea (adult) (pediatric): Secondary | ICD-10-CM | POA: Diagnosis present

## 2011-04-26 DIAGNOSIS — M7989 Other specified soft tissue disorders: Secondary | ICD-10-CM

## 2011-04-26 DIAGNOSIS — G061 Intraspinal abscess and granuloma: Secondary | ICD-10-CM

## 2011-04-26 DIAGNOSIS — E785 Hyperlipidemia, unspecified: Secondary | ICD-10-CM | POA: Diagnosis present

## 2011-04-26 DIAGNOSIS — F329 Major depressive disorder, single episode, unspecified: Secondary | ICD-10-CM | POA: Diagnosis present

## 2011-04-26 DIAGNOSIS — I824Z9 Acute embolism and thrombosis of unspecified deep veins of unspecified distal lower extremity: Principal | ICD-10-CM | POA: Diagnosis present

## 2011-04-26 DIAGNOSIS — F3289 Other specified depressive episodes: Secondary | ICD-10-CM | POA: Diagnosis present

## 2011-04-26 DIAGNOSIS — F0390 Unspecified dementia without behavioral disturbance: Secondary | ICD-10-CM | POA: Diagnosis present

## 2011-04-26 DIAGNOSIS — R609 Edema, unspecified: Secondary | ICD-10-CM

## 2011-04-26 DIAGNOSIS — T8450XA Infection and inflammatory reaction due to unspecified internal joint prosthesis, initial encounter: Secondary | ICD-10-CM

## 2011-04-26 DIAGNOSIS — R600 Localized edema: Secondary | ICD-10-CM

## 2011-04-26 DIAGNOSIS — A4101 Sepsis due to Methicillin susceptible Staphylococcus aureus: Secondary | ICD-10-CM

## 2011-04-26 DIAGNOSIS — J4489 Other specified chronic obstructive pulmonary disease: Secondary | ICD-10-CM | POA: Diagnosis present

## 2011-04-26 DIAGNOSIS — J449 Chronic obstructive pulmonary disease, unspecified: Secondary | ICD-10-CM | POA: Diagnosis present

## 2011-04-26 LAB — COMPREHENSIVE METABOLIC PANEL
AST: 14 U/L (ref 0–37)
Albumin: 2.9 g/dL — ABNORMAL LOW (ref 3.5–5.2)
Alkaline Phosphatase: 86 U/L (ref 39–117)
BUN: 19 mg/dL (ref 6–23)
Chloride: 101 mEq/L (ref 96–112)
Potassium: 3.9 mEq/L (ref 3.5–5.1)
Total Bilirubin: 0.2 mg/dL — ABNORMAL LOW (ref 0.3–1.2)

## 2011-04-26 LAB — SEDIMENTATION RATE: Sed Rate: 20 mm/hr — ABNORMAL HIGH (ref 0–16)

## 2011-04-26 LAB — DIFFERENTIAL
Basophils Absolute: 0 10*3/uL (ref 0.0–0.1)
Eosinophils Relative: 4 % (ref 0–5)
Lymphocytes Relative: 21 % (ref 12–46)
Neutro Abs: 4.3 10*3/uL (ref 1.7–7.7)
Neutrophils Relative %: 62 % (ref 43–77)

## 2011-04-26 LAB — CBC
HCT: 34.1 % — ABNORMAL LOW (ref 39.0–52.0)
RDW: 14 % (ref 11.5–15.5)
WBC: 6.8 10*3/uL (ref 4.0–10.5)

## 2011-04-26 LAB — C-REACTIVE PROTEIN: CRP: 1.2 mg/dL — ABNORMAL HIGH (ref <0.60)

## 2011-04-26 MED ORDER — CEPHALEXIN 500 MG PO CAPS: 1000.0000 mg | ORAL_CAPSULE | Freq: Two times a day (BID) | ORAL | Status: AC

## 2011-04-26 NOTE — Assessment & Plan Note (Signed)
I will check doppler here. Not sure why he is having waxing, waning edema here. Does not appear infected

## 2011-04-26 NOTE — Assessment & Plan Note (Signed)
Complicated infection sp surgery by Dr Lovell Sheehan. Appears resolved. NO hardware

## 2011-04-26 NOTE — Patient Instructions (Signed)
We will get a doppler to rule out blood clot in your leg  Finish your IV cefazolin and after you have completed this start the keflex 1000mg  twice daily for two weeks

## 2011-04-26 NOTE — Discharge Summary (Signed)
NAME:  Micheal, Frank NO.:  [id number]  MEDICAL RECORD NO.:  [id number]  LOCATION:  5037                         FACILITY:  [facility]  PHYSICIAN:  Burnard Bunting, M.D.    DATE OF BIRTH:  05-31-1926  DATE OF ADMISSION:  03/16/2011 DATE OF DISCHARGE:  03/30/2011                              DISCHARGE SUMMARY   DISCHARGE DIAGNOSES:  Left total knee infection.  SECONDARY DIAGNOSES:  Subdural hematoma.  OPERATIONS AND PROCEDURES: 1. Left knee arthroscopy and washout, March 17, 2011. 2. Left TKA removal with placement of antibiotic spacer performed on     March 21, 2011.  CONSULTS: 1. Infectious Disease consultation, March 20, 2011. 2. Neurosurgery consultation performed, March 25, 2011.  HOSPITAL COURSE:  Micheal Frank is an 77 year old patient with left TKA infection.  He underwent irrigation, debridement, polyethylene liner exchange 3 months ago.  This was part of staph sepsis hospitalization where his back was also infected.  The patient did well for 3 months then presented to the office with left knee swelling.  Aspiration was positive for infection.  He was admitted to the hospital and underwent arthroscopic washout on March 17, 2011, while planning for hardware removal was in progress.  He subsequently underwent hardware removal on March 21, 2011.  He was started on Coumadin for DVT prophylaxis. Antibiotics spacers were placed.  Organism was methicillin-sensitive staph aureus.  IV Ancef was initiated.  The patient was noted to have some mental status changes on the weekend of March 25, 2011. Neurosurgical consultation was obtained.  CT scan showed increase in fluid collection around his brain.  Coumadin was reversed.  His mental status cleared and a subsequent CT scan performed on March 29, 2011, showed no increase in the size of the subdural hematoma.  The patient was cleared by Neurosurgery for discharge with followup in  several weeks.  From an Infectious Disease standpoint, the patient will be maintained on IV and oral antibiotics.  He is discharged to skilled nursing facility, weightbearing as tolerated on that left lower extremity with a knee immobilizer in place and will follow up with me next week for suture removal.  Nutritional status was assessed in the hospital.  INR was normalized at the time of discharge.  He is discharged to skilled nursing facility in good condition.  DISCHARGE MEDICATIONS: 1. Enablex 7.5 mg p.o. daily. 2. Valium 5 mg p.o. daily. 3. Calcium carbonate 500 mg p.o. daily. 4. Advair 1 puff inhaler b.i.d. 5. Bupropion HCl 100 mg p.o. daily. 6. Potassium chloride 20 mEq p.o. daily. 7. Cardura 4 mg p.o. b.i.d. 8. Flomax 0.4 mg p.o. daily. 9. Zocor 40 mg daily at 6 o'clock. 10.MiraLax 17 g p.o. daily. 11.ReQuip 1 mg p.o. at bedtime. 12.Protein supplements ProSource one p.o. b.i.d. 13.Ensure 237 mL p.o. b.i.d. 14.Ancef 2 g IV q.8 h. 15.Ambien 5 mg p.o. at bedtime p.r.n. 16.Meclizine 25 mg p.o. q.i.d. p.r.n. 17.Vicodin 5/325 one p.o. q.6 h. p.r.n. pain. 18.Albuterol 1-2 puffs inhaler q.6 h. p.r.n.  He is discharged in good condition.  He will follow up with me next Monday for suture removal.     G. Dorene Grebe, M.D.     GSD/MEDQ  D:  03/30/2011  T:  03/30/2011  Job:  [id number]  Electronically Signed by Reece Agar.  [name] M.D. on 04/26/2011 02:40:29 PM

## 2011-04-26 NOTE — Assessment & Plan Note (Signed)
Complete 6 week course of IV ancef. I will check repeat ESR and CRP today. I have given pt script for keflex 1 g bid x 2 weeks after finishing the IV ancef. Hopefully he will have cleared this so that replacement of prosthesis can be considered. One option to help with this decision would be sterile aspiration of the knee TWO WEEKS AFTER he is OFF ALL antibiotics. I will send [name] a note and ask also for results oft he fluid analysis.

## 2011-04-26 NOTE — Progress Notes (Signed)
Subjective:    Patient ID: Micheal Frank, male    DOB: 1926-05-22, 77 y.o.   MRN: [id number]  HPI  77 year old man who was found to have MSSA sepsis, bacteremia and complicated  epidural abscess and infected prosthetic knee in May sp L3 through L5 laminectomy for drainage of lumbar epidural abscess at L2-3, 3-4, 4-5, and 5-1 using microdissection, by Dr Lovell Sheehan and irrigation of the left knee, sp IV ancef and rifampin followed with po keflex and and rifampin who then had recurrence of infection in the left knee sp I and D and removal of the prosthesis on September 4th with MSSA once again growing, then placed on high dose ancef. He returnss for followup today with me in clinic. Since being seen in the hospital he has developed swelling of the knee and this was aspirated by [name] in his clinic. He also has had swelling in the foot and had doppler in house that was negative, and a few days ago MRI that failed to show osteomyelitis or abscess. He is without fevers, nausea or malaise and has only pain in knee with weight bearing. He lost his hearing aid in the hospital,. I spent greater than 45 minutes with this patient including greater than 50% of time in face to face counselling of the patient and his daughter and in coordination of his care.     by Dr. Lovell Sheehan to   Review of Systems  Constitutional: Negative for fever, chills, diaphoresis, activity change, appetite change, fatigue and unexpected weight change.  HENT: Negative for congestion, sore throat, rhinorrhea, sneezing, trouble swallowing and sinus pressure.   Eyes: Negative for photophobia and visual disturbance.  Respiratory: Negative for cough, chest tightness, shortness of breath, wheezing and stridor.   Cardiovascular: Negative for chest pain, palpitations and leg swelling.  Gastrointestinal: Negative for nausea, vomiting, abdominal pain, diarrhea, constipation, blood in stool, abdominal distention and anal bleeding.    Genitourinary: Negative for dysuria, hematuria, flank pain and difficulty urinating.  Musculoskeletal: Positive for myalgias, joint swelling, arthralgias and gait problem. Negative for back pain.  Skin: Positive for color change. Negative for pallor, rash and wound.  Neurological: Negative for dizziness, tremors, weakness and light-headedness.  Hematological: Negative for adenopathy. Does not bruise/bleed easily.  Psychiatric/Behavioral: Negative for behavioral problems, confusion, sleep disturbance, dysphoric mood, decreased concentration and agitation.       Objective:   Physical Exam  Constitutional: He is oriented to person, place, and time. He appears well-developed and well-nourished. No distress.  HENT:  Head: Normocephalic and atraumatic.  Mouth/Throat: Oropharynx is clear and moist. No oropharyngeal exudate.  Eyes: Conjunctivae and EOM are normal. Pupils are equal, round, and reactive to light. No scleral icterus.  Neck: Normal range of motion. Neck supple. No JVD present.  Cardiovascular: Normal rate, regular rhythm and normal heart sounds.  Exam reveals no gallop and no friction rub.   No murmur heard. Pulmonary/Chest: Effort normal and breath sounds normal. No respiratory distress. He has no wheezes. He has no rales. He exhibits no tenderness.  Abdominal: He exhibits no distension and no mass. There is no tenderness. There is no rebound and no guarding.  Musculoskeletal: He exhibits edema. He exhibits no tenderness.       Legs: Lymphadenopathy:    He has no cervical adenopathy.  Neurological: He is alert and oriented to person, place, and time. He has normal reflexes. He exhibits normal muscle tone. Coordination normal.  Skin: Skin is warm and dry. He  is not diaphoretic. No erythema. No pallor.  Psychiatric: He has a normal mood and affect. His behavior is normal. Judgment and thought content normal.          Assessment & Plan:  Prosthetic joint infection, left  knee Complete 6 week course of IV ancef. I will check repeat ESR and CRP today. I have given pt script for keflex 1 g bid x 2 weeks after finishing the IV ancef. Hopefully he will have cleared this so that replacement of prosthesis can be considered. One option to help with this decision would be sterile aspiration of the knee TWO WEEKS AFTER he is OFF ALL antibiotics. I will send [name] a note and ask also for results oft he fluid analysis.  Lower leg edema I will check doppler here. Not sure why he is having waxing, waning edema here. Does not appear infected  Spinal epidural abscess Complicated infection sp surgery by Dr Lovell Sheehan. Appears resolved. NO hardware  Staphylococcus aureus bacteremia with sepsis He has had ample IV antibiotics for this.

## 2011-04-26 NOTE — Assessment & Plan Note (Signed)
He has had ample IV antibiotics for this.

## 2011-04-27 ENCOUNTER — Inpatient Hospital Stay (HOSPITAL_COMMUNITY): Payer: Medicare Other

## 2011-04-27 LAB — CBC
HCT: 33.1 % — ABNORMAL LOW (ref 39.0–52.0)
Hemoglobin: 10.8 g/dL — ABNORMAL LOW (ref 13.0–17.0)
MCH: 30.6 pg (ref 26.0–34.0)
MCHC: 32.6 g/dL (ref 30.0–36.0)
MCV: 93.8 fL (ref 78.0–100.0)

## 2011-04-27 LAB — BASIC METABOLIC PANEL
BUN: 17 mg/dL (ref 6–23)
CO2: 27 mEq/L (ref 19–32)
Chloride: 103 mEq/L (ref 96–112)
Creatinine, Ser: 1.29 mg/dL (ref 0.50–1.35)
GFR calc Af Amer: 57 mL/min — ABNORMAL LOW (ref >90)

## 2011-04-27 LAB — MRSA PCR SCREENING: MRSA by PCR: NEGATIVE

## 2011-04-27 MED ORDER — IOHEXOL 300 MG/ML  SOLN
100.0000 mL | Freq: Once | INTRAMUSCULAR | Status: AC | PRN
  Administered 2011-04-27: 30 mL via INTRAVENOUS

## 2011-04-27 NOTE — H&P (Addendum)
NAME:  Micheal Frank, BAREFOOT NO.:  [id number]  MEDICAL RECORD NO.:  [id number]  LOCATION:  VASC                         FACILITY:  [facility]  PHYSICIAN:  Erick Blinks, MD     DATE OF BIRTH:  1926/06/20  DATE OF ADMISSION:  04/26/2011 DATE OF DISCHARGE:                             HISTORY & PHYSICAL   PRIMARY CARE PHYSICIAN:  Theressa Millard, MD  CHIEF COMPLAINT:  Left lower extremity edema.  HISTORY OF PRESENT ILLNESS:  This is an 77 year old gentleman who has a complex past medical history and was recently in the hospital for methicillin-sensitive Staph aureus bacteremia and epidural abscess along with an infected left knee prosthesis.  The patient has been on long- term IV antibiotics and is followed by Dr. Daiva Eves from Infectious Disease.  He was recently discharged from the hospital on April 13, 2011, after a suspected left lower extremity DVT.  He was at a nursing facility at that time, and when his left lower extremity was swollen, a venous Doppler was done at that time which was apparently positive for any underlying deep venous thrombosis.  He was sent to the emergency room at that time where repeat venous Doppler was negative for any acute DVT.  This was this was reviewed by Dr. Josephina Gip of Vascular Surgery.  The patient was discharged at that point.  Since that time, he has been home.  He has been working with physical therapy at home and from what the family reports, he is generally doing well.  He has had some on and off swelling of his left lower extremity which occasionally does get worse associated with redness and pain, but then resolves on its own.  He was following up with Dr. Daiva Eves today for routine check, and after seeing the left lower extremity edema, repeat venous Dopplers were ordered which were positive for 2 underlying DVTs per family. Unfortunately, I do not have access to records to the venous Dopplers that were done earlier today  nor do I have any preliminary reports.  I have called the vascular lab which did confirm that the patient did have an underlying deep venous thrombosis.  Otherwise, the patient denies any chest pain or shortness of breath.  No fever, cough, nausea, vomiting, or diarrhea.  He has had occasional incontinence.  No abdominal pain. No pain in his foot.  The patient is nonambulatory at this point and for this reason, he needs surgery.  Otherwise no other complaints.  The patient is hard of hearing.  PAST MEDICAL HISTORY: 1. Recent right frontal subdural hematoma. 2. Left knee methicillin-sensitive Staph aureus infection. 3. History of epidural abscess status post drainage. 4. History of C. diff colitis. 5. History of urinary retention. 6. Obstructive sleep apnea on CPAP. 7. Benign prostatic hypertrophy. 8. History of neuropathy and restless leg syndrome. 9. History of melanoma. 10.COPD. 11.Lumbar radiculopathy. 12.Hyperlipidemia.  MEDICATIONS PRIOR TO ADMISSION: 1. Albuterol. 2. Aspirin. 3. Bupropion. 4. Calcium carbonate. 5. Ancef. 6. Cephalexin. 7. Enablex. 8. Valium. 9. Cardura. 10.Advair. 11.Hydrocodone/acetaminophen. 12.Meclizine. 13.Singulair. 14.MiraLAX. 15.Klor-Con. 16.Requip. 17.Zocor. 18.Flomax. 19.Ambien.  ALLERGIES:  NO KNOWN DRUG ALLERGIES.  FAMILY HISTORY:  Noncontributory.  SOCIAL HISTORY:  The patient  does not drink or smoke.  He is currently living at home with his family, is nonambulatory.  PHYSICAL EXAMINATION:  VITAL SIGNS:  Temperature 97.4, blood pressure 120/69, respirations 18, pulse 92, and pulse ox 96% on room air. GENERAL:  The patient is in no acute distress, lying comfortably in bed. HEENT:  Normocephalic and atraumatic.  Pupils are equal, round, and reactive to light. NECK:  Supple. CHEST:  Clear to auscultation bilaterally. CARDIAC:  Exam shows S1 and S2 with regular rate and rhythm. ABDOMEN:  Soft and nontender.  Bowel sounds are  active. EXTREMITIES:  Show 2+ pitting edema in the left lower extremity.  No pitting edema in the right lower extremity.  The left knee does feel warm, but there is no significant erythema in the lower extremities.  LABS:  CBC and chemistry are currently pending.  ASSESSMENT: 1. Acute deep venous thrombosis. 2. History of recent subdural hematoma, not a candidate for     anticoagulation. 3. History of methicillin-susceptible Staphylococcus aureus     bacteremia. 4. Epidural abscess status post drainage in the past. 5. Infected left knee prosthesis status post removal.  PLAN:  We will admit the patient to telemetry bed.  He is not a candidate for anticoagulation with his acute deep venous thrombosis, but he will need IVC filter placement.  We have requested Interventional Radiology to place this tomorrow.  The remainder of the patient's medical problems do appear stable at this time.  Code status is a DNR.  This was confirmed with the patient's wife.  Further orders will per the clinical course.  We will inform Dr. Earl Gala of the patient's admission.     Erick Blinks, MD     JM/MEDQ  D:  04/26/2011  T:  04/26/2011  Job:  [id number]  cc:   Theressa Millard, M.D.  Electronically Signed by Erick Blinks  on 05/06/2011 07:33:19 PM

## 2011-04-29 NOTE — Discharge Summary (Signed)
  NAME:  Micheal Frank, Micheal Frank NO.:  [id number]  MEDICAL RECORD NO.:  [id number]  LOCATION:  3714                         FACILITY:  [facility]  PHYSICIAN:  Theressa Millard, M.D.    DATE OF BIRTH:  [birth year]  DATE OF ADMISSION:  04/26/2011 DATE OF DISCHARGE:  04/28/2011                              DISCHARGE SUMMARY   ADMITTING DIAGNOSIS:  Deep venous thrombosis.  DISCHARGE DIAGNOSES: 1. Deep venous thrombosis. 2. History of subdural hematoma, inability to anticoagulate the     patient, therefore IVC filter placed. 3. History of methicillin-sensitive Staph aureus with epidural abscess     along with infected left knee prosthesis status post explant of     left total knee prosthesis. 4. Chronic obstructive pulmonary disease. 5. Moderate sleep apnea. 6. Depression. 7. Meniere's disease. 8. Mild dementia. 9. Hypercholesterolemia. 10.History of subdural hematoma. 11.History of malnutrition secondary to medical illnesses.  The patient was seen in late September, he was thought to possibly have a DVT on one Doppler, but followup Doppler was negative.  He had some increased swelling and a Doppler was done, which showed evidence of a left tibial vein thrombosis as well as a short segment in the femoral vein.  He was brought in for filter placement.  He is not an anticoagulation candidate because he has recently had a subdural hematoma.  HOSPITAL COURSE:  The patient was admitted and underwent filter placement on April 27, 2011.  He tolerated the procedure well and there were no obvious complications.  He had no significant pain in his legs related to the blood clot and he was discharged in improved condition.  DISCHARGE ACTIVITY:  Per PT and increase activity slowly.  DISCHARGE MEDICATIONS: 1. Advair 250/50 one puff twice daily. 2. Bupropion XR 100 mg daily. 3. Calcium carbonate 600 mg daily. 4. Kefzol 2 g IV q.8 h. until discontinued in approximately 10-14  days. 5. Diazepam 5 mg daily. 6. Doxazosin 4 mg twice daily. 7. Hydrocodone 5/325 q.i.d. 8. Klor-Con 20 mEq daily. 9. Meclizine 25 mg q.i.d. p.r.n. 10.Multivitamin daily. 11.Ropinirole 1 mg daily. 12.Simvastatin 40 mg daily. 13.VESIcare 5 mg daily. 14.Zolpidem 5 mg at bedtime p.r.n.  FOLLOWUP:  He has an appointment to see [name] in approximately 10 days.  At that time, a sed rate and CRP will be done and [name] will make an opinion as to whether his IV antibiotics can be stopped.  He has already seen Dr. Daryll Drown and has oral antibiotics ready to replace the IV antibiotics.  Subsequently [name] will be doing reimplantation of the knee prosthesis.  DIET:  No added salt.  WOUND CARE:  None.     Theressa Millard, M.D.     JO/MEDQ  D:  04/28/2011  T:  04/28/2011  Job:  [id number]  Electronically Signed by Theressa Millard M.D. on 04/29/2011 08:47:37 PM

## 2011-04-29 NOTE — Discharge Summary (Signed)
NAME:  Micheal Frank, TOOTHMAN NO.:  [id number]  MEDICAL RECORD NO.:  [id number]  LOCATION:  3029                         FACILITY:  [facility]  PHYSICIAN:  Theressa Millard, M.D.    DATE OF BIRTH:  Jan 02, 1926  DATE OF ADMISSION:  04/11/2011 DATE OF DISCHARGE:  04/13/2011                              DISCHARGE SUMMARY   ADMITTING DIAGNOSIS:  Possible deep venous thrombosis.  DISCHARGE DIAGNOSES: 1. No evidence of deep venous thrombosis. 2. History of methicillin-sensitive staph aureus with epidural abscess     along with infected left knee prosthesis. 3. Chronic obstructive pulmonary disease. 4. Moderate sleep apnea. 5. Depression. 6. Meniere disease. 7. Dementia, mild. 8. Hypercholesterolemia. 9. History of subdural hematoma. 10.Mild malnutrition secondary to medical illnesses.  The patient is an 77 year old white male who has had a very stormy course over the last several months after he developed a methicillin- sensitive staph aureus bacteremia, epidural abscess, and infected knee prosthesis.  He has been in and out of the hospital most recently having had his left knee prosthesis removed with antibiotic spacers placed.  He is being followed closely by Dr. Dorene Grebe.  He was doing well and had to be discharged from skilled nursing facility to home when he developed some additional knee swelling and so Doppler examination was done at the facility.  This suggested the possibility of a clot in the left upper leg.  He is not an anticoagulation candidate because of a recent subdural hematoma and so he was brought in for possible placement of filter.  However, before we did that, the Doppler was repeated in the ED and it was negative.  HOSPITAL COURSE:  The patient was brought in for observation and so we could get a physician review of the patient's Doppler examination.  This was done kindly by Quita Skye. Hart Rochester, MD, who personally reviewed the study and called me to  tell me that it was indeed negative throughout the entire course of the patient's deep venous system.  Arrangements were then made for the patient to go directly home instead of back to the skilled nursing facility and this was all arranged and he was sent home in improved condition.  I spoke later with [name], who stated the patient needed IV antibiotics for approximately a month with followup sed rates and CRPs, which [name] will do.  Then, a decision will need to be made on changing him to oral antibiotics.  DISCHARGE MEDICATIONS: 1. Advair Diskus 250/50 one puff twice daily. 2. Albuterol inhaler 2 puffs q.6 h p.r.n. 3. Aspirin 81 mg daily. 4. Bupropion SR 100 mg daily. 5. Calcium carbonate 600 mg daily. 6. Cefazolin 1 g IV q.8 h x30 days at home. 7. Diazepam 5 mg daily. 8. Doxazosin 4 mg twice daily. 9. Flaxseed oil 1000 mg daily. 10.Hydrocodone/APAP 5/325 q.i.d. p.r.n. 11.Klor-Con 20 mEq daily. 12.Meclizine 25 mg q.6 h p.r.n. 13.MiraLax 17 g daily. 14.Multivitamin daily. 15.Ropinirole 1 mg daily. 16.Simvastatin 40 mg daily. 17.Tamsulosin 0.4 mg daily. 18.Vesicare 5 mg daily. 19.Zolpidem 5 mg at bedtime p.r.n.  The patient is discharged home.  He has abrasion on the left leg related to his prior surgeries.  He  will be visited by [place] for home health nursing and they will continue IV antibiotics at home.  DIET:  Unrestricted.     Theressa Millard, M.D.     JO/MEDQ  D:  04/21/2011  T:  04/21/2011  Job:  [id number]  Electronically Signed by Theressa Millard M.D. on 04/29/2011 08:47:29 PM

## 2011-05-01 ENCOUNTER — Other Ambulatory Visit (HOSPITAL_COMMUNITY): Payer: Self-pay | Admitting: Neurosurgery

## 2011-05-01 DIAGNOSIS — R4182 Altered mental status, unspecified: Secondary | ICD-10-CM

## 2011-05-01 DIAGNOSIS — T50995A Adverse effect of other drugs, medicaments and biological substances, initial encounter: Secondary | ICD-10-CM

## 2011-05-01 DIAGNOSIS — I609 Nontraumatic subarachnoid hemorrhage, unspecified: Secondary | ICD-10-CM

## 2011-05-05 ENCOUNTER — Other Ambulatory Visit (HOSPITAL_COMMUNITY): Payer: Medicare Other

## 2011-05-12 ENCOUNTER — Ambulatory Visit (HOSPITAL_COMMUNITY)
Admission: RE | Admit: 2011-05-12 | Discharge: 2011-05-12 | Disposition: A | Discharge status: Home / Self Care | Payer: Medicare Other | Source: Ambulatory Visit | Attending: Neurosurgery | Admitting: Neurosurgery

## 2011-05-12 DIAGNOSIS — Z8673 Personal history of transient ischemic attack (TIA), and cerebral infarction without residual deficits: Secondary | ICD-10-CM | POA: Insufficient documentation

## 2011-05-12 DIAGNOSIS — R4182 Altered mental status, unspecified: Secondary | ICD-10-CM | POA: Insufficient documentation

## 2011-05-12 DIAGNOSIS — I609 Nontraumatic subarachnoid hemorrhage, unspecified: Secondary | ICD-10-CM

## 2011-05-12 DIAGNOSIS — T50995A Adverse effect of other drugs, medicaments and biological substances, initial encounter: Secondary | ICD-10-CM

## 2011-05-15 ENCOUNTER — Other Ambulatory Visit: Payer: Self-pay | Admitting: Orthopedic Surgery

## 2011-05-15 DIAGNOSIS — M25569 Pain in unspecified knee: Secondary | ICD-10-CM

## 2011-05-16 ENCOUNTER — Ambulatory Visit
Admission: RE | Admit: 2011-05-16 | Discharge: 2011-05-16 | Disposition: A | Discharge status: Home / Self Care | Payer: Medicare Other | Source: Ambulatory Visit | Attending: Orthopedic Surgery | Admitting: Orthopedic Surgery

## 2011-05-16 DIAGNOSIS — M25569 Pain in unspecified knee: Secondary | ICD-10-CM

## 2011-05-16 MED ORDER — GADOBENATE DIMEGLUMINE 529 MG/ML IV SOLN
7.0000 mL | Freq: Once | INTRAVENOUS | Status: AC | PRN
  Administered 2011-05-16: 7 mL via INTRAVENOUS

## 2011-05-17 ENCOUNTER — Encounter (HOSPITAL_COMMUNITY): Payer: Self-pay | Admitting: Pharmacy Technician

## 2011-05-18 ENCOUNTER — Encounter (HOSPITAL_COMMUNITY)
Admission: RE | Admit: 2011-05-18 | Discharge: 2011-05-18 | Disposition: A | Discharge status: Home / Self Care | Payer: Medicare Other | Source: Ambulatory Visit | Attending: Orthopedic Surgery | Admitting: Orthopedic Surgery

## 2011-05-18 ENCOUNTER — Encounter (HOSPITAL_COMMUNITY): Payer: Self-pay

## 2011-05-18 HISTORY — DX: Other complications of anesthesia, initial encounter: T88.59XA

## 2011-05-18 HISTORY — DX: Unspecified osteoarthritis, unspecified site: M19.90

## 2011-05-18 HISTORY — DX: Depression, unspecified: F32.A

## 2011-05-18 HISTORY — DX: Disease of blood and blood-forming organs, unspecified: D75.9

## 2011-05-18 HISTORY — DX: Polyneuropathy, unspecified: G62.9

## 2011-05-18 HISTORY — DX: Hyperlipidemia, unspecified: E78.5

## 2011-05-18 HISTORY — DX: Frequency of micturition: R35.0

## 2011-05-18 HISTORY — DX: Adverse effect of unspecified anesthetic, initial encounter: T41.45XA

## 2011-05-18 HISTORY — DX: Other amnesia: R41.3

## 2011-05-18 HISTORY — DX: Shortness of breath: R06.02

## 2011-05-18 HISTORY — DX: Sleep apnea, unspecified: G47.30

## 2011-05-18 HISTORY — DX: Personal history of other venous thrombosis and embolism: Z86.718

## 2011-05-18 HISTORY — DX: Major depressive disorder, single episode, unspecified: F32.9

## 2011-05-18 HISTORY — DX: Pneumonia, unspecified organism: J18.9

## 2011-05-18 HISTORY — DX: Other specified postprocedural states: Z98.890

## 2011-05-18 HISTORY — DX: Chronic obstructive pulmonary disease, unspecified: J44.9

## 2011-05-18 LAB — DIFFERENTIAL
Eosinophils Relative: 6 % — ABNORMAL HIGH (ref 0–5)
Lymphocytes Relative: 17 % (ref 12–46)
Lymphs Abs: 1.3 10*3/uL (ref 0.7–4.0)
Monocytes Absolute: 1.1 10*3/uL — ABNORMAL HIGH (ref 0.1–1.0)
Monocytes Relative: 15 % — ABNORMAL HIGH (ref 3–12)
Neutro Abs: 4.6 10*3/uL (ref 1.7–7.7)

## 2011-05-18 LAB — URINALYSIS, ROUTINE W REFLEX MICROSCOPIC
Glucose, UA: NEGATIVE mg/dL
Ketones, ur: NEGATIVE mg/dL
Protein, ur: NEGATIVE mg/dL

## 2011-05-18 LAB — CBC
HCT: 36.9 % — ABNORMAL LOW (ref 39.0–52.0)
Hemoglobin: 11.9 g/dL — ABNORMAL LOW (ref 13.0–17.0)
MCHC: 32.2 g/dL (ref 30.0–36.0)
MCV: 95.3 fL (ref 78.0–100.0)
RDW: 14.3 % (ref 11.5–15.5)

## 2011-05-18 LAB — COMPREHENSIVE METABOLIC PANEL
ALT: <5 U/L (ref 0–53)
AST: 14 U/L (ref 0–37)
Alkaline Phosphatase: 81 U/L (ref 39–117)
CO2: 29 mEq/L (ref 19–32)
Calcium: 9.9 mg/dL (ref 8.4–10.5)
GFR calc Af Amer: 66 mL/min — ABNORMAL LOW (ref >90)
GFR calc non Af Amer: 57 mL/min — ABNORMAL LOW (ref >90)
Glucose, Bld: 84 mg/dL (ref 70–99)
Potassium: 4 mEq/L (ref 3.5–5.1)
Sodium: 137 mEq/L (ref 135–145)
Total Protein: 7.1 g/dL (ref 6.0–8.3)

## 2011-05-18 LAB — URINE MICROSCOPIC-ADD ON

## 2011-05-18 LAB — SEDIMENTATION RATE: Sed Rate: 58 mm/hr — ABNORMAL HIGH (ref 0–16)

## 2011-05-18 LAB — PROTIME-INR: INR: 0.99 (ref 0.00–1.49)

## 2011-05-18 NOTE — Pre-Procedure Instructions (Signed)
20 Micheal Frank  05/18/2011   Your procedure is scheduled on: Tues,Nov 6th at 0730 Report to [facility] at 0530 AM.  Call this number if you have problems the morning of surgery: [phone]   Remember:   Do not eat food:After Midnight.  Do not drink clear liquids: 4 Hours before arrival.  Take these medicines the morning of surgery with A SIP OF WATER: Albuterol,Wellbutrin,Cardura,Advair,Pain pill (if needed),Antivert   Do not wear jewelry, make-up or nail polish.  Do not wear lotions, powders, or perfumes. You may wear deodorant.  Do not shave 48 hours prior to surgery.  Do not bring valuables to the hospital.  Contacts, dentures or bridgework may not be worn into surgery.  Leave suitcase in the car. After surgery it may be brought to your room.  For patients admitted to the hospital, checkout time is 11:00 AM the day of discharge.   Patients discharged the day of surgery will not be allowed to drive home.  Name and phone number of your driver:   Special Instructions: CHG Shower Use Special Wash: 1/2 bottle night before surgery and 1/2 bottle morning of surgery.   Please read over the following fact sheets that you were given: Pain Booklet, Coughing and Deep Breathing, Blood Transfusion Information, MRSA Information and Surgical Site Infection Prevention

## 2011-05-19 LAB — C-REACTIVE PROTEIN: CRP: 2.57 mg/dL — ABNORMAL HIGH (ref <0.60)

## 2011-05-19 NOTE — H&P (Signed)
NAME:  Micheal Frank, Micheal Frank NO.:  [id number]  MEDICAL RECORD NO.:  [id number]  LOCATION:  3029                         FACILITY:  [facility]  PHYSICIAN:  Bhavika Schnider L. Lendell Caprice, MDDATE OF BIRTH:  [birth year]  DATE OF ADMISSION:  04/11/2011 DATE OF DISCHARGE:                             HISTORY & PHYSICAL   CHIEF COMPLAINT:  Abnormal ultrasound of the leg showing DVT.  HISTORY OF PRESENT ILLNESS:  [name] is a pleasant 77 year old white male who was sent to the emergency room from skilled nursing facility with an outpatient study showing a preliminary result of a small nonocclusive thrombus at the proximal left superficial femoral vein. This was a preliminary result and I see no final reading.  The patient has had a recent left knee arthroscopy wash out and total knee arthroplasty removal, and antibiotic spacer placement on March 21, 2011, by [name] for methicillin-sensitive staph aureus infection.  He was on Coumadin postoperatively for DVT prophylaxis.  Apparently, he had an episode of confusion and had a spontaneous right frontal subdural hematoma.  Apparently, previous CAT scan showed a hygroma, but a repeat CAT scan showed a bleed and mass effect.  The Coumadin was reversed and patient required no further intervention.  He has been recovering at skilled nursing facility and getting rehabilitation and IV Ancef. Apparently, the rounding physician there noted that his left knee seemed more swollen and ordered a Doppler.  It was positive, so patient was sent to the emergency room.  The patient was evaluated by ED physician, Dr. Brooke Dare who repeated the Doppler study.  Interestingly, the repeat Doppler done at 6:45 p.m. by our vascular lab shows no evidence of DVT, superficial thrombus, or Baker cyst on the left.  I have spoken with Dr. Mayford Knife, Radiology and she is unable to access the images.  The patient reports no pain more than what he had been experiencing.  He has  had no shortness of breath or chest pain.  I have just now found out about this repeat Doppler and the ED physician had written holding orders with the plan to probably putting in an IVC filter.  The vascular tech has left for the evening and I am unable to contact anyone about this repeat negative study.  PAST MEDICAL HISTORY: 1. Recent right frontal subdural hematoma. 2. Left knee methicillin-sensitive staph aureus infection, currently     continuing on IV Ancef. 3. History of epidural abscess, status post drainage in May and June     of this year, methicillin-sensitive staph aureus. 4. History of C. diff colitis. 5. History of urinary retention. 6. Obstructive sleep apnea on CPAP. 7. Benign prostatic hypertrophy. 8. History of neuropathy and restless legs syndrome. 9. History of melanoma. 10.COPD. 11.Lumbar radiculopathy. 12.Hyperlipidemia.  MEDICATIONS: 1. Advair 250/50 one puff b.i.d. 2. Aspirin 81 mg a day. 3. Bupropion SR 100 mg a day. 4. Calcium carbonate 600 mg daily. 5. Ancef 2 g every 8 hours. 6. Combivent inhaler 1-2 puffs every 6 hours as needed for wheezing. 7. Diazepam 5 mg daily. 8. Doxazosin 4 mg twice a day. 9. Ensure twice a day. 10.Flaxseed oil 1000 mg daily. 11.Vicodin one p.o. q.i.d. p.r.n. pain. 12.Klor-Con  20 mEq a day. 13.Meclizine 25 mg p.o. q.i.d. p.r.n. 14.MiraLax 17 g a day. 15.Protein supplement twice a day. 16.Ropinirole 1 mg nightly. 17.Simvastatin 40 mg a day. 18.Flomax 0.4 mg a day. 19.Tylenol 650 mg p.o. q.6 hours p.r.n. 20.VESIcare 5 mg daily. 21.Zolpidem 5 mg nightly as needed for sleep.  No known drug allergies.  SOCIAL HISTORY:  He is here with his daughter whose name is Salome Holmes, her telephone number is [phone].  The patient does not drink and smoke.  He is currently at skilled nursing facility and is planned to be discharged this coming Friday.  FAMILY HISTORY:  Noncontributory.  SYSTEMS REVIEWED:  As above,  otherwise negative.  PHYSICAL EXAMINATION:  VITAL SIGNS:  Temperature is 98 degrees, blood pressure 132/74, pulse 71, respiratory rate 16, oxygen saturation 95% on room air. GENERAL:  The patient is an elderly hard-of-hearing quite male in no acute distress. HEENT:  Normocephalic, atraumatic.  Pupils equal, round, and reactive to light.  Sclerae nonicteric. MOUTH:  He does have some thrush, white plaques.  Moist mucous membranes. NECK:  Supple.  No lymphadenopathy. LUNGS:  Clear to auscultation bilaterally without wheezes, rhonchi, or rales. CARDIOVASCULAR:  Regular rate and rhythm without murmurs, gallops, or rubs. ABDOMEN:  Normal bowel sounds, soft, nontender, nondistended. GU AND RECTAL:  Deferred. EXTREMITIES:  He has some edema around his left knee.  There is no calf tenderness or edema about the calf.  The incision is healed.  There is no warmth. NEUROLOGIC:  Alert and oriented.  Hard of hearing, but cranial nerves are otherwise intact.  No focal deficits noted. SKIN:  No rash. PSYCHIATRIC:  The patient is calm and cooperative with a normal affect.  LABS:  CBC significant for a hemoglobin of 10, hematocrit 30, MCV 94, INR is 1.17.  Basic metabolic panel significant for a BUN of 33.  EKG shows sinus tachycardia with a rate of 112, low-voltage throughout.  ASSESSMENT AND PLAN: 1. Possible left leg deep vein thrombosis:  Unfortunately, we have two     studies with the conflicting results.  Both of these are     preliminary results.  The tech has already gone home and the     radiologist is unable to access images.  At this point, I will     check a D-dimer and if negative, it is unlikely to be a deep vein     thrombosis.  I would however recommend that the rounding physician     in the morning discussed the studies and potentially compare images     with Vascular tomorrow to see whether or not there is any evidence     of deep vein thrombosis.  He may need a venogram or  other if     results are still conflicting.  He would not be a good Coumadin     candidate due to spontaneous subdural hematoma on Coumadin a few     weeks ago.  If he does in fact have a deep vein thrombosis filter     is the best option. 2. Methicillin-sensitive staph aureus infection of the knee:  The     patient still has his peripherally inserted central catheter line     in and I will continue Ancef. 3. Obstructive sleep apnea.  Continue continuous positive airway     pressure. 4. History of urinary retention.  Continue doxazosin and Flomax. 5. Chronic obstructive pulmonary disease, stable.     Brisia Schuermann L. Lendell Caprice, MD  CLS/MEDQ  D:  04/11/2011  T:  04/12/2011  Job:  [id number]  cc:   G. Dorene Grebe, M.D. Hewitt Shorts, M.D.  Electronically Signed by Crista Curb MD on 05/19/2011 02:02:27 PM

## 2011-05-21 LAB — URINE CULTURE: Culture  Setup Time: 201211011451

## 2011-05-22 ENCOUNTER — Encounter (HOSPITAL_COMMUNITY): Payer: Self-pay

## 2011-05-22 MED ORDER — VANCOMYCIN HCL IN DEXTROSE 1-5 GM/200ML-% IV SOLN
1000.0000 mg | INTRAVENOUS | Status: DC
  Filled 2011-05-22: qty 200

## 2011-05-23 ENCOUNTER — Encounter (HOSPITAL_COMMUNITY): Payer: Self-pay | Admitting: *Deleted

## 2011-05-23 ENCOUNTER — Other Ambulatory Visit: Payer: Self-pay | Admitting: Orthopedic Surgery

## 2011-05-23 ENCOUNTER — Encounter (HOSPITAL_COMMUNITY)
Admission: RE | Disposition: A | Discharge status: Home Health | Payer: Self-pay | Source: Ambulatory Visit | Attending: Orthopedic Surgery

## 2011-05-23 ENCOUNTER — Inpatient Hospital Stay (HOSPITAL_COMMUNITY): Payer: Medicare Other

## 2011-05-23 ENCOUNTER — Encounter (HOSPITAL_COMMUNITY): Payer: Self-pay | Admitting: Certified Registered"

## 2011-05-23 ENCOUNTER — Inpatient Hospital Stay (HOSPITAL_COMMUNITY)
Admission: RE | Admit: 2011-05-23 | Discharge: 2011-05-29 | DRG: 470 | Disposition: A | Discharge status: Home Health | Payer: Medicare Other | Source: Ambulatory Visit | Attending: Orthopedic Surgery | Admitting: Orthopedic Surgery

## 2011-05-23 ENCOUNTER — Inpatient Hospital Stay (HOSPITAL_COMMUNITY): Payer: Medicare Other | Admitting: Certified Registered"

## 2011-05-23 DIAGNOSIS — F329 Major depressive disorder, single episode, unspecified: Secondary | ICD-10-CM | POA: Diagnosis present

## 2011-05-23 DIAGNOSIS — J449 Chronic obstructive pulmonary disease, unspecified: Secondary | ICD-10-CM | POA: Diagnosis present

## 2011-05-23 DIAGNOSIS — Z79899 Other long term (current) drug therapy: Secondary | ICD-10-CM

## 2011-05-23 DIAGNOSIS — Z96659 Presence of unspecified artificial knee joint: Secondary | ICD-10-CM

## 2011-05-23 DIAGNOSIS — Z4789 Encounter for other orthopedic aftercare: Secondary | ICD-10-CM

## 2011-05-23 DIAGNOSIS — T8450XA Infection and inflammatory reaction due to unspecified internal joint prosthesis, initial encounter: Principal | ICD-10-CM | POA: Diagnosis present

## 2011-05-23 DIAGNOSIS — M810 Age-related osteoporosis without current pathological fracture: Secondary | ICD-10-CM | POA: Diagnosis present

## 2011-05-23 DIAGNOSIS — G609 Hereditary and idiopathic neuropathy, unspecified: Secondary | ICD-10-CM | POA: Diagnosis present

## 2011-05-23 DIAGNOSIS — Y92009 Unspecified place in unspecified non-institutional (private) residence as the place of occurrence of the external cause: Secondary | ICD-10-CM

## 2011-05-23 DIAGNOSIS — M21869 Other specified acquired deformities of unspecified lower leg: Secondary | ICD-10-CM | POA: Diagnosis present

## 2011-05-23 DIAGNOSIS — Z89529 Acquired absence of unspecified knee: Secondary | ICD-10-CM

## 2011-05-23 DIAGNOSIS — Z87891 Personal history of nicotine dependence: Secondary | ICD-10-CM

## 2011-05-23 DIAGNOSIS — Y831 Surgical operation with implant of artificial internal device as the cause of abnormal reaction of the patient, or of later complication, without mention of misadventure at the time of the procedure: Secondary | ICD-10-CM | POA: Diagnosis present

## 2011-05-23 DIAGNOSIS — N390 Urinary tract infection, site not specified: Secondary | ICD-10-CM | POA: Diagnosis present

## 2011-05-23 DIAGNOSIS — I82409 Acute embolism and thrombosis of unspecified deep veins of unspecified lower extremity: Secondary | ICD-10-CM | POA: Diagnosis present

## 2011-05-23 DIAGNOSIS — Z01812 Encounter for preprocedural laboratory examination: Secondary | ICD-10-CM

## 2011-05-23 DIAGNOSIS — E785 Hyperlipidemia, unspecified: Secondary | ICD-10-CM | POA: Diagnosis present

## 2011-05-23 DIAGNOSIS — F3289 Other specified depressive episodes: Secondary | ICD-10-CM | POA: Diagnosis present

## 2011-05-23 DIAGNOSIS — K219 Gastro-esophageal reflux disease without esophagitis: Secondary | ICD-10-CM | POA: Diagnosis present

## 2011-05-23 DIAGNOSIS — J4489 Other specified chronic obstructive pulmonary disease: Secondary | ICD-10-CM | POA: Diagnosis present

## 2011-05-23 HISTORY — PX: TOTAL KNEE REVISION: SHX996

## 2011-05-23 LAB — POCT I-STAT 4, (NA,K, GLUC, HGB,HCT)
Glucose, Bld: 119 mg/dL — ABNORMAL HIGH (ref 70–99)
HCT: 24 % — ABNORMAL LOW (ref 39.0–52.0)

## 2011-05-23 LAB — CBC
MCV: 92.7 fL (ref 78.0–100.0)
Platelets: 182 10*3/uL (ref 150–400)
RBC: 3.03 MIL/uL — ABNORMAL LOW (ref 4.22–5.81)
WBC: 11.3 10*3/uL — ABNORMAL HIGH (ref 4.0–10.5)

## 2011-05-23 LAB — GRAM STAIN

## 2011-05-23 LAB — TYPE AND SCREEN

## 2011-05-23 SURGERY — TOTAL KNEE REVISION
Anesthesia: General | Site: Knee | Laterality: Left

## 2011-05-23 MED ORDER — NEOSTIGMINE METHYLSULFATE 1 MG/ML IJ SOLN
INTRAMUSCULAR | Status: DC | PRN
  Administered 2011-05-23: 2 mg via INTRAVENOUS

## 2011-05-23 MED ORDER — ALUMINUM HYDROXIDE GEL 600 MG/5ML PO SUSP
15.0000 mL | ORAL | Status: DC | PRN
  Filled 2011-05-23: qty 30

## 2011-05-23 MED ORDER — METOCLOPRAMIDE HCL 10 MG PO TABS: 5.0000 mg | ORAL_TABLET | Freq: Three times a day (TID) | ORAL | Status: DC | PRN

## 2011-05-23 MED ORDER — SODIUM CHLORIDE 0.9 % IV SOLN
1000.0000 mg | INTRAVENOUS | Status: DC | PRN
  Administered 2011-05-23: 1 g via INTRAVENOUS

## 2011-05-23 MED ORDER — CIPROFLOXACIN IN D5W 400 MG/200ML IV SOLN
400.0000 mg | Freq: Two times a day (BID) | INTRAVENOUS | Status: DC
Start: 2011-05-23 — End: 2011-05-23
  Filled 2011-05-23 (×3): qty 200

## 2011-05-23 MED ORDER — ONDANSETRON HCL 4 MG/2ML IJ SOLN: 4.0000 mg | Freq: Four times a day (QID) | INTRAMUSCULAR | Status: DC | PRN

## 2011-05-23 MED ORDER — SODIUM CHLORIDE 0.9 % IV SOLN
INTRAVENOUS | Status: DC | PRN
  Administered 2011-05-23: 12:00:00 via INTRAVENOUS

## 2011-05-23 MED ORDER — HETASTARCH-ELECTROLYTES 6 % IV SOLN
INTRAVENOUS | Status: DC | PRN
  Administered 2011-05-23: 10:00:00 via INTRAVENOUS

## 2011-05-23 MED ORDER — BISACODYL 5 MG PO TBEC: 10.0000 mg | DELAYED_RELEASE_TABLET | Freq: Every day | ORAL | Status: DC | PRN

## 2011-05-23 MED ORDER — PROPOFOL 10 MG/ML IV EMUL
INTRAVENOUS | Status: DC | PRN
  Administered 2011-05-23: 120 mg via INTRAVENOUS

## 2011-05-23 MED ORDER — EPHEDRINE SULFATE 50 MG/ML IJ SOLN
INTRAMUSCULAR | Status: DC | PRN
  Administered 2011-05-23: 10 mg via INTRAVENOUS
  Administered 2011-05-23 (×2): 5 mg via INTRAVENOUS
  Administered 2011-05-23: 10 mg via INTRAVENOUS
  Administered 2011-05-23 (×6): 5 mg via INTRAVENOUS
  Administered 2011-05-23: 10 mg via INTRAVENOUS
  Administered 2011-05-23: 5 mg via INTRAVENOUS
  Administered 2011-05-23: 10 mg via INTRAVENOUS
  Administered 2011-05-23 (×3): 5 mg via INTRAVENOUS

## 2011-05-23 MED ORDER — MAGNESIUM HYDROXIDE 400 MG/5ML PO SUSP: 30.0000 mL | Freq: Two times a day (BID) | ORAL | Status: DC | PRN

## 2011-05-23 MED ORDER — GLYCOPYRROLATE 0.2 MG/ML IJ SOLN
INTRAMUSCULAR | Status: DC | PRN
  Administered 2011-05-23 (×2): .2 mg via INTRAVENOUS

## 2011-05-23 MED ORDER — ALBUMIN HUMAN 5 % IV SOLN
INTRAVENOUS | Status: DC | PRN
  Administered 2011-05-23 (×2): via INTRAVENOUS

## 2011-05-23 MED ORDER — ROCURONIUM BROMIDE 100 MG/10ML IV SOLN
INTRAVENOUS | Status: DC | PRN
  Administered 2011-05-23: 50 mg via INTRAVENOUS

## 2011-05-23 MED ORDER — FENTANYL CITRATE 0.05 MG/ML IJ SOLN
INTRAMUSCULAR | Status: DC | PRN
  Administered 2011-05-23 (×2): 50 ug via INTRAVENOUS
  Administered 2011-05-23: 250 ug via INTRAVENOUS

## 2011-05-23 MED ORDER — DOCUSATE SODIUM 100 MG PO CAPS
100.0000 mg | ORAL_CAPSULE | Freq: Two times a day (BID) | ORAL | Status: DC
  Administered 2011-05-24 – 2011-05-29 (×11): 100 mg via ORAL
  Filled 2011-05-23 (×12): qty 1

## 2011-05-23 MED ORDER — POLYETHYLENE GLYCOL 3350 17 G PO PACK
17.0000 g | PACK | Freq: Every day | ORAL | Status: DC | PRN
  Filled 2011-05-23: qty 1

## 2011-05-23 MED ORDER — ALUM & MAG HYDROXIDE-SIMETH 200-200-20 MG/5ML PO SUSP: 30.0000 mL | ORAL | Status: DC | PRN

## 2011-05-23 MED ORDER — ONDANSETRON HCL 4 MG PO TABS: 4.0000 mg | ORAL_TABLET | Freq: Four times a day (QID) | ORAL | Status: DC | PRN

## 2011-05-23 MED ORDER — LACTATED RINGERS IV SOLN
INTRAVENOUS | Status: DC | PRN
  Administered 2011-05-23 (×2): via INTRAVENOUS

## 2011-05-23 MED ORDER — ONDANSETRON HCL 4 MG/2ML IJ SOLN
INTRAMUSCULAR | Status: DC | PRN
  Administered 2011-05-23: 4 mg via INTRAVENOUS

## 2011-05-23 MED ORDER — CIPROFLOXACIN IN D5W 400 MG/200ML IV SOLN
INTRAVENOUS | Status: DC | PRN
  Administered 2011-05-23: 400 mg via INTRAVENOUS

## 2011-05-23 MED ORDER — BISACODYL 10 MG RE SUPP: 10.0000 mg | Freq: Every day | RECTAL | Status: DC | PRN

## 2011-05-23 MED ORDER — MENTHOL 3 MG MT LOZG: 1.0000 | LOZENGE | OROMUCOSAL | Status: DC | PRN

## 2011-05-23 MED ORDER — FLEET ENEMA 7-19 GM/118ML RE ENEM: 1.0000 | ENEMA | Freq: Every day | RECTAL | Status: DC | PRN

## 2011-05-23 MED ORDER — CLONIDINE HCL (ANALGESIA) 100 MCG/ML EP SOLN
150.0000 ug | Freq: Once | EPIDURAL | Status: DC
  Filled 2011-05-23: qty 1.5

## 2011-05-23 MED ORDER — PHENOL 1.4 % MT LIQD
1.0000 | OROMUCOSAL | Status: DC | PRN
  Filled 2011-05-23: qty 177

## 2011-05-23 MED ORDER — PROMETHAZINE HCL 25 MG/ML IJ SOLN: 6.2500 mg | INTRAMUSCULAR | Status: DC | PRN

## 2011-05-23 MED ORDER — ACETAMINOPHEN 650 MG RE SUPP: 650.0000 mg | Freq: Four times a day (QID) | RECTAL | Status: DC | PRN

## 2011-05-23 MED ORDER — POTASSIUM CHLORIDE IN NACL 20-0.9 MEQ/L-% IV SOLN
INTRAVENOUS | Status: AC
  Administered 2011-05-23 – 2011-05-24 (×3): via INTRAVENOUS
  Filled 2011-05-23 (×4): qty 1000

## 2011-05-23 MED ORDER — ACETAMINOPHEN 10 MG/ML IV SOLN
INTRAVENOUS | Status: DC | PRN
  Administered 2011-05-23: 1000 mg via INTRAVENOUS

## 2011-05-23 MED ORDER — CEFUROXIME SODIUM 1.5 G IJ SOLR
INTRAMUSCULAR | Status: DC | PRN
  Administered 2011-05-23: 1.5 g

## 2011-05-23 MED ORDER — LACTATED RINGERS IV SOLN: INTRAVENOUS | Status: DC

## 2011-05-23 MED ORDER — ACETAMINOPHEN 325 MG PO TABS
650.0000 mg | ORAL_TABLET | Freq: Four times a day (QID) | ORAL | Status: DC | PRN
  Filled 2011-05-23: qty 2

## 2011-05-23 MED ORDER — HYDROMORPHONE HCL PF 1 MG/ML IJ SOLN
0.5000 mg | INTRAMUSCULAR | Status: DC | PRN
  Administered 2011-05-23 – 2011-05-24 (×4): 0.5 mg via INTRAVENOUS
  Filled 2011-05-23 (×4): qty 1

## 2011-05-23 MED ORDER — HYDROMORPHONE HCL PF 1 MG/ML IJ SOLN
0.2500 mg | INTRAMUSCULAR | Status: DC | PRN
  Administered 2011-05-23: 0.25 mg via INTRAVENOUS

## 2011-05-23 MED ORDER — METOCLOPRAMIDE HCL 5 MG/ML IJ SOLN
5.0000 mg | Freq: Three times a day (TID) | INTRAMUSCULAR | Status: DC | PRN
Start: 2011-05-23 — End: 2011-05-29
  Filled 2011-05-23: qty 2

## 2011-05-23 MED ORDER — LACTATED RINGERS IV SOLN
INTRAVENOUS | Status: DC | PRN
  Administered 2011-05-23 (×2): via INTRAVENOUS

## 2011-05-23 MED ORDER — PHENYLEPHRINE HCL 10 MG/ML IJ SOLN
INTRAMUSCULAR | Status: DC | PRN
  Administered 2011-05-23: 40 ug via INTRAVENOUS
  Administered 2011-05-23: 80 ug via INTRAVENOUS
  Administered 2011-05-23 (×5): 40 ug via INTRAVENOUS
  Administered 2011-05-23: 80 ug via INTRAVENOUS
  Administered 2011-05-23 (×3): 40 ug via INTRAVENOUS
  Administered 2011-05-23: 80 ug via INTRAVENOUS
  Administered 2011-05-23 (×3): 40 ug via INTRAVENOUS

## 2011-05-23 SURGICAL SUPPLY — 99 items
ADAPTER FEMORAL BOLT NEUTRAL (Knees) ×2 IMPLANT
ADPR FEM 5D STRL KN PFC SGM (Orthopedic Implant) ×1 IMPLANT
ADPR FEM NTRL BOLT STRL KN PFC (Knees) ×1 IMPLANT
AUG FEM SZ3 4 CMB POST STRL LF (Knees) ×2 IMPLANT
AUG TIB SZ2 10 REV STP WDG (Knees) ×2 IMPLANT
BANDAGE ELASTIC 4 VELCRO ST LF (GAUZE/BANDAGES/DRESSINGS) ×2 IMPLANT
BANDAGE ELASTIC 6 VELCRO ST LF (GAUZE/BANDAGES/DRESSINGS) ×6 IMPLANT
BANDAGE ESMARK 6X9 LF (GAUZE/BANDAGES/DRESSINGS) ×1 IMPLANT
BIT DRILL 2.9 CANN QC NONSTRL (BIT) ×2 IMPLANT
BLADE SAG 18X100X1.27 (BLADE) ×2 IMPLANT
BLADE SAW SGTL 13.0X1.19X90.0M (BLADE) ×2 IMPLANT
BLADE SURG 10 STRL SS (BLADE) ×4 IMPLANT
BNDG CMPR 9X6 STRL LF SNTH (GAUZE/BANDAGES/DRESSINGS) ×1
BNDG COHESIVE 6X5 TAN STRL LF (GAUZE/BANDAGES/DRESSINGS) ×2 IMPLANT
BNDG ESMARK 6X9 LF (GAUZE/BANDAGES/DRESSINGS) ×2
BOWL SMART MIX CTS (DISPOSABLE) ×2 IMPLANT
CEMENT HV SMART SET (Cement) ×6 IMPLANT
CLOTH BEACON ORANGE TIMEOUT ST (SAFETY) ×2 IMPLANT
COVER BACK TABLE 24X17X13 BIG (DRAPES) IMPLANT
COVER SURGICAL LIGHT HANDLE (MISCELLANEOUS) ×2 IMPLANT
CUFF TOURNIQUET SINGLE 24IN (TOURNIQUET CUFF) ×2 IMPLANT
CUFF TOURNIQUET SINGLE 34IN LL (TOURNIQUET CUFF) IMPLANT
CUFF TOURNIQUET SINGLE 44IN (TOURNIQUET CUFF) IMPLANT
DRAPE INCISE IOBAN 66X45 STRL (DRAPES) IMPLANT
DRAPE ORTHO SPLIT 77X108 STRL (DRAPES) ×4
DRAPE PROXIMA HALF (DRAPES) ×2 IMPLANT
DRAPE SURG ORHT 6 SPLT 77X108 (DRAPES) ×2 IMPLANT
DRAPE U-SHAPE 47X51 STRL (DRAPES) ×2 IMPLANT
DRAPE X-RAY CASS 24X20 (DRAPES) IMPLANT
DRSG PAD ABDOMINAL 8X10 ST (GAUZE/BANDAGES/DRESSINGS) ×4 IMPLANT
DURAPREP 26ML APPLICATOR (WOUND CARE) ×2 IMPLANT
ELECT REM PT RETURN 9FT ADLT (ELECTROSURGICAL) ×2
ELECTRODE REM PT RTRN 9FT ADLT (ELECTROSURGICAL) ×1 IMPLANT
EVACUATOR 1/8 PVC DRAIN (DRAIN) ×2 IMPLANT
FACESHIELD LNG OPTICON STERILE (SAFETY) ×2 IMPLANT
FEM TC3 PFC SZ3 LEFT (Orthopedic Implant) ×2 IMPLANT
FEMORAL ADAPTER (Orthopedic Implant) ×2 IMPLANT
FEMORAL TC3 PFC SZ3 LEFT (Orthopedic Implant) ×1 IMPLANT
GAUZE XEROFORM 5X9 LF (GAUZE/BANDAGES/DRESSINGS) ×2 IMPLANT
GLOVE BIO SURGEON ST LM GN SZ9 (GLOVE) IMPLANT
GLOVE BIOGEL PI IND STRL 7.5 (GLOVE) IMPLANT
GLOVE BIOGEL PI IND STRL 8 (GLOVE) ×1 IMPLANT
GLOVE BIOGEL PI INDICATOR 7.5 (GLOVE)
GLOVE BIOGEL PI INDICATOR 8 (GLOVE) ×1
GLOVE ECLIPSE 7.0 STRL STRAW (GLOVE) IMPLANT
GLOVE SURG ORTHO 8.0 STRL STRW (GLOVE) ×2 IMPLANT
GLOVE SURGEON 8 (GLOVE) ×2 IMPLANT
GOWN PREVENTION PLUS LG XLONG (DISPOSABLE) IMPLANT
GOWN PREVENTION PLUS XLARGE (GOWN DISPOSABLE) ×2 IMPLANT
GOWN STRL NON-REIN LRG LVL3 (GOWN DISPOSABLE) ×6 IMPLANT
HANDPIECE INTERPULSE COAX TIP (DISPOSABLE) ×2
HOOD PEEL AWAY FACE SHEILD DIS (HOOD) ×8 IMPLANT
IMMOBILIZER KNEE 20 (SOFTGOODS)
IMMOBILIZER KNEE 20 THIGH 36 (SOFTGOODS) IMPLANT
IMMOBILIZER KNEE 22 UNIV (SOFTGOODS) ×2 IMPLANT
IMMOBILIZER KNEE 24 THIGH 36 (MISCELLANEOUS) IMPLANT
IMMOBILIZER KNEE 24 UNIV (MISCELLANEOUS)
INSERT SZ 3 22.5MM (Knees) ×2 IMPLANT
IV NS IRRIG 3000ML (IV SOLUTION) ×4 IMPLANT
K-WIRE ACE 1.6X6 (WIRE) ×2
KIT BASIN OR (CUSTOM PROCEDURE TRAY) ×2 IMPLANT
KIT ROOM TURNOVER OR (KITS) ×2 IMPLANT
KWIRE ACE 1.6X6 (WIRE) ×1 IMPLANT
MANIFOLD NEPTUNE II (INSTRUMENTS) ×2 IMPLANT
NEEDLE SPNL 18GX3.5 QUINCKE PK (NEEDLE) ×2 IMPLANT
NS IRRIG 1000ML POUR BTL (IV SOLUTION) ×2 IMPLANT
PACK TOTAL JOINT (CUSTOM PROCEDURE TRAY) ×2 IMPLANT
PAD ARMBOARD 7.5X6 YLW CONV (MISCELLANEOUS) ×2 IMPLANT
PAD CAST 4YDX4 CTTN HI CHSV (CAST SUPPLIES) ×1 IMPLANT
PADDING CAST COTTON 4X4 STRL (CAST SUPPLIES) ×2
PADDING CAST COTTON 6X4 STRL (CAST SUPPLIES) ×4 IMPLANT
POST AVE PFC 4MM (Knees) ×4 IMPLANT
RUBBERBAND STERILE (MISCELLANEOUS) ×2 IMPLANT
SCREW ACE CAN 4.0 28M (Screw) ×2 IMPLANT
SET HNDPC FAN SPRY TIP SCT (DISPOSABLE) ×1 IMPLANT
SLEEVE UNIV FEM DIST PRO SZ 31 (Sleeve) ×2 IMPLANT
SPONGE GAUZE 4X4 12PLY (GAUZE/BANDAGES/DRESSINGS) ×2 IMPLANT
SPONGE LAP 18X18 X RAY DECT (DISPOSABLE) ×4 IMPLANT
STAPLER VISISTAT 35W (STAPLE) ×2 IMPLANT
STEM UNIVERSAL REVISION 75X12 (Stem) ×2 IMPLANT
STEM UNIVERSAL REVISION 75X18 (Stem) ×2 IMPLANT
SUCTION FRAZIER TIP 10 FR DISP (SUCTIONS) ×2 IMPLANT
SUT ETHILON 3 0 PS 1 (SUTURE) ×2 IMPLANT
SUT VIC AB 0 CTB1 27 (SUTURE) ×6 IMPLANT
SUT VIC AB 1 CT1 27 (SUTURE) ×6
SUT VIC AB 1 CT1 27XBRD ANBCTR (SUTURE) ×3 IMPLANT
SUT VIC AB 2-0 CT1 27 (SUTURE) ×6
SUT VIC AB 2-0 CT1 TAPERPNT 27 (SUTURE) ×3 IMPLANT
SWAB CULTURE LIQ STUART DBL (MISCELLANEOUS) ×4 IMPLANT
SYR 30ML SLIP (SYRINGE) ×2 IMPLANT
TOWEL OR 17X24 6PK STRL BLUE (TOWEL DISPOSABLE) ×2 IMPLANT
TOWEL OR 17X26 10 PK STRL BLUE (TOWEL DISPOSABLE) ×4 IMPLANT
TRAY FOLEY CATH 14FR (SET/KITS/TRAYS/PACK) ×2 IMPLANT
TRAY REVISION SZ 2.5 (Knees) ×2 IMPLANT
TRAY SLEEVE CEM ML (Knees) ×2 IMPLANT
TUBE ANAEROBIC SPECIMEN COL (MISCELLANEOUS) ×4 IMPLANT
WATER STERILE IRR 1000ML POUR (IV SOLUTION) ×4 IMPLANT
WEDGE SZ 2.0MM (Knees) ×4 IMPLANT
YANKAUER SUCT BULB TIP NO VENT (SUCTIONS) ×2 IMPLANT

## 2011-05-23 NOTE — Transfer of Care (Signed)
Immediate Anesthesia Transfer of Care Note  Patient: Micheal Frank  Procedure(s) Performed:  TOTAL KNEE REVISION - LEFT KNEE REMOVAL OF SPACER, POSSIBLE REIMPLANTATION OF REVISION TKA VERSES REPLACEMENT ANTIBIOTIC SPACER  Patient Location: PACU  Anesthesia Type: General  Level of Consciousness: responds to stimulation  Airway & Oxygen Therapy: Patient Spontanous Breathing and Patient connected to face mask oxygen  Post-op Assessment: Report given to PACU RN and Post -op Vital signs reviewed and stable  Post vital signs: Reviewed and stable  Complications: No apparent anesthesia complications

## 2011-05-23 NOTE — H&P (Signed)
Micheal Frank is an 77 y.o. male.   Chief Complaint: l tka infxn HPI: 77 yo male 8 wks s/p tka removal presents for tka replant vs. abx spacer replacement. No f/c  Past Medical History  Diagnosis Date  . Subdural hematoma 03/30/11  . Epidural abscess   . Staphylococcus aureus bacteremia with sepsis   . Prosthetic joint infection   . Complication of anesthesia     confusion with anesthesia  . Hyperlipidemia   . History of blood clots   . Blood dyscrasia     pt CAN NOT have any blood thinners d/t hx brain bleed  . S/P right heart catheterization     at least 71yrs  . Asthma   . COPD (chronic obstructive pulmonary disease)     emphysema  . Shortness of breath     with exertion  . Pneumonia     as a child and in 1952;had a pneumonia vaccine couple of years ago  . Peripheral neuropathy   . Short-term memory loss   . Arthritis     left leg also has swelling  . Gastric ulcer   . Constipation     miralax prn  . Depression     takes wellbutrin bid  . Urinary frequency     wears depends  . Sleep apnea     has CPAP but doesn't use it;sleep study done at least 58yrs ago    Past Surgical History  Procedure Date  . Hernia repair     25+yrs ago  . 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 Total knee arthroplasty     x 2 left knee 2003/2012  . Filtering procedure     filter placed in neck d/t blood  clot  . Eye surgery     bil cataract surgery  . Skin biopsy     melanoma on head   . Knee arthroscopy     History reviewed. No pertinent family history. Social History:  reports that he has quit smoking. He does not have any smokeless tobacco history on file. He reports that he does not drink alcohol or use illicit drugs.  Allergies:  Allergies  Allergen Reactions  . Oysters (Shellfish Allergy) Other (See Comments)    Vomiting and passed    Medications Prior to Admission  Medication Dose Route Frequency Provider Last Rate Last Dose  .  ciprofloxacin (CIPRO) IVPB 500 mg  500 mg Intravenous Q12H Corrie Mckusick Jilleen Essner      . vancomycin (VANCOCIN) IVPB 1000 mg/200 mL premix  1,000 mg Intravenous 60 min Pre-Op [name]       Medications Prior to Admission  Medication Sig Dispense Refill  . buPROPion (WELLBUTRIN SR) 100 MG 12 hr tablet Take 100 mg by mouth 2 (two) times daily.       . calcium carbonate (OS-CAL) 600 MG TABS Take 600 mg by mouth daily.       . diazepam (VALIUM) 5 MG tablet Take 5 mg by mouth every 8 (eight) hours as needed. For anxiety      . doxazosin (CARDURA) 4 MG tablet Take 4 mg by mouth 2 (two) times daily.        . Fluticasone-Salmeterol (ADVAIR DISKUS) 250-50 MCG/DOSE AEPB Inhale 1 puff into the lungs every 12 (twelve) hours.        Marland Kitchen HYDROcodone-acetaminophen (NORCO) 5-325 MG per tablet Take 1 tablet by mouth every 6 (six) hours  as needed. For pain       . potassium chloride (KLOR-CON) 20 MEQ packet Take 20 mEq by mouth daily.        Marland Kitchen rOPINIRole (REQUIP) 1 MG tablet Take 1 mg by mouth at bedtime.       . simvastatin (ZOCOR) 40 MG tablet Take 40 mg by mouth at bedtime.       Marland Kitchen zolpidem (AMBIEN) 5 MG tablet Take 5 mg by mouth at bedtime as needed. For sleep      . albuterol (PROVENTIL HFA;VENTOLIN HFA) 108 (90 BASE) MCG/ACT inhaler Inhale 1-2 puffs into the lungs every 6 (six) hours as needed. For shortness of breath      . dextrose 5 % SOLN 50 mL with ceFAZolin 1 G SOLR 1,000 mg Inject 2,000 mg into the vein every 8 (eight) hours.        . Flaxseed, Linseed, ([employer] FLAXSEED OIL) 1000 MG CAPS Take 1,000 mg by mouth daily.        . meclizine (ANTIVERT) 25 MG tablet Take 25 mg by mouth 4 (four) times daily as needed. For vertigo      . montelukast (SINGULAIR) 10 MG tablet Take 10 mg by mouth at bedtime.        . polyethylene glycol (MIRALAX / GLYCOLAX) packet Take 17 g by mouth daily.          No results found for this or any previous visit (from the past 48 hour(s)). No results found.  Review of  Systems  Constitutional: Negative.   HENT: Negative.   Eyes: Negative.   Respiratory: Negative.   Cardiovascular: Positive for leg swelling.  Gastrointestinal: Negative.   Genitourinary: Positive for frequency.  Musculoskeletal: Negative for back pain.  Skin: Negative.   Neurological: Negative.   Endo/Heme/Allergies: Negative.     Blood pressure 109/66, pulse 69, temperature 97.4 F (36.3 C), resp. rate 18, SpO2 100.00%. Physical Exam  Constitutional: He is oriented to person, place, and time.  Neck: Normal range of motion. Neck supple.  Cardiovascular: Normal rate, regular rhythm and intact distal pulses.   Respiratory: Effort normal and breath sounds normal.  GI: Soft.  Musculoskeletal: He exhibits edema.       Left knee: He exhibits effusion.       Effusion present ext mech ok collaterals stable edema present lle no groin pain  Neurological: He is alert and oriented to person, place, and time.  Skin: Skin is warm and dry.     Assessment/Plan l tka infxn s/p removal and abx spacer plcment   Now ready for replant - labs were wnl for infxn until recently now uti present to account for increase. Plan cipro for uti and possible revision vs replant abx spacer. DVT positive but ivc filter placed - per [facility] rec will not use dvt prophylaxis post op b/c of subdural hematoma. Risks and benefits d/w family - pt is high risk for cxs. All ? answered  Angelissa Supan SCOTT 05/23/2011, 7:03 AM

## 2011-05-23 NOTE — Anesthesia Procedure Notes (Addendum)
Procedure Name: Intubation Date/Time: 05/23/2011 8:07 AM Performed by: Leona Singleton A. Oxygen Delivery Method: Circle System Utilized Preoxygenation: Pre-oxygenation with 100% oxygen Intubation Type: IV induction Ventilation: Mask ventilation without difficulty and Oral airway inserted - appropriate to patient size Laryngoscope Size: Hyacinth Meeker and 2 Grade View: Grade I Tube type: Oral Tube size: 7.5 mm Number of attempts: 1 Placement Confirmation: ETT inserted through vocal cords under direct vision,  positive ETCO2 and breath sounds checked- equal and bilateral Secured at: 21 cm Tube secured with: Tape Dental Injury: Teeth and Oropharynx as per pre-operative assessment

## 2011-05-23 NOTE — Anesthesia Postprocedure Evaluation (Signed)
  Anesthesia Post-op Note  Patient: Micheal Frank  Procedure(s) Performed:  TOTAL KNEE REVISION - LEFT KNEE REMOVAL OF SPACER, POSSIBLE REIMPLANTATION OF REVISION TKA VERSES REPLACEMENT ANTIBIOTIC SPACER  Patient Location: PACU  Anesthesia Type: General  Level of Consciousness: awake  Airway and Oxygen Therapy: Patient Spontanous Breathing and Patient connected to face mask oxygen  Post-op Pain: none  Post-op Assessment: Post-op Vital signs reviewed, Patient's Cardiovascular Status Stable, Respiratory Function Stable, No signs of Nausea or vomiting and Pain level controlled  Post-op Vital Signs: Reviewed and stable  Complications: No apparent anesthesia complications2

## 2011-05-23 NOTE — Op Note (Signed)
NAME:  Micheal Frank, Micheal Frank NO.:  [id number]  MEDICAL RECORD NO.:  [id number]  LOCATION:  [facility]                         FACILITY:  [facility]  PHYSICIAN:  Burnard Bunting, M.D.    DATE OF BIRTH:  1925-08-12  DATE OF PROCEDURE:  05/23/2011 DATE OF DISCHARGE:                              OPERATIVE REPORT   PREOPERATIVE DIAGNOSIS:  Left total knee infection.  POSTOPERATIVE DIAGNOSIS:  Left total knee infection.  PROCEDURE:  Left total knee removal of antibiotic spacer, revision, total knee arthroplasty.  SURGEON:  Burnard Bunting, MD  ASSISTANT:  [name], PA  ANESTHESIA:  General endotracheal.  ESTIMATED BLOOD LOSS:  About 800.  DRAINS:  Hemovac x1.  SPECIMENS:  Cultures of fluid x2 sent to microbiology and synovium sent to pathology where 4 polys per high-power field were identified.  Stat Gram-stain negative for organisms.  The patient had 2 units of packed red blood cells, 2 vials of albumin, and 1 unit of Hespan.  Hemovac utilized x1.  Tourniquet was not utilized.  PROCEDURE IN DETAIL:  The patient was brought to the operating room where general endotracheal anesthesia was induced and proper antibiotics were administered.  Left leg was pre-scrubbed with alcohol and Betadine which was allowed to air dry, prepped with DuraPrep solution, draped in sterile manner.  Time-out was called and Micheal Frank was used to cover the operative field.  Incision was made.  Anterior approach to knee was utilized.  Skin and subcutaneous tissues were sharply divided.  Culture specimens of fluid was obtained x2.  In general, the tissue looked healthy and viable.  No recurrent infection was present.  Using osteotomes, the antibiotic spacer and cement were removed.  In general, the patient had a small defect on the anteromedial tibia measuring about 1-1/2 cm.  Otherwise, the remaining tibial plateau was intact.  Thorough debridement was performed with a curette of the bony surfaces  and the synovial surfaces.  At this time, the tibia was prepared for a stem implant.  The sleeve was also utilized.  The implants were DePuy implants, revision type.  Following hand reaming of the canal, the tibial plateau was cut with an oscillating saw, sleeve broaching was performed, trial component was placed.  Because the joint line was lowered, it required 10-mm augmentation.  Attention was then directed towards the femur.  Intramedullary canal was then prepared for a press- fit stem 18 mm, that was 12 mm on the tibia.  The distal cut was then performed along with 2 posterior cuts.  Anterior and posterior chamfer cuts were then performed 5 degrees of valgus.  The trial component was placed along with 2 posterior augments, 20-mm spacer was placed.  The patient actually had excellent flexion and extension, good stability, varus-valgus stress at 0, 30, and 90 degrees with good patellar tracking.  At this time, trial components were removed.  Irrigation with about 6-9 L of irrigating solution was performed.  True components were cemented into position.  Zinacef was added to the antibiotic cement.  A 22-mm spacer was placed which gave full extension, full flexion with excellent patellar tracking, and no instability with varus-valgus stress at 0 and 30 degrees.  In the process of placing the last true polyethylene spacer, small fleck of bone came off the medial condyle. This was replaced with a 4-0 cancellous cannulated screw with a good fixation achieved with excellent stability, particularly to valgus stress was maintained.  Tourniquet was not utilized.  Incision was then closed over Hemovac drain using #1 Vicryl suture, 0-Vicryl suture, 2-0 Vicryl suture, and skin staples.  The patient tolerated the procedure well without immediate complications.  Micheal Frank's assistance was required all times during the case for retraction, limb positioning, opening and closing.  Her assistance was  a medical necessity.     Burnard Bunting, M.D.     GSD/MEDQ  D:  05/23/2011  T:  05/23/2011  Job:  [id number]

## 2011-05-23 NOTE — Brief Op Note (Signed)
05/23/2011  12:29 PM  PATIENT:  Micheal Frank  77 y.o. male  PRE-OPERATIVE DIAGNOSIS:  Left total knee arthroplasty infection  POST-OPERATIVE DIAGNOSIS:  Left total knee arthroplasty infection  PROCEDURE:  Procedure(s): TOTAL KNEE REVISION  SURGEON:  Surgeon(s): [name]  PHYSICIAN ASSISTANT:   ASSISTANTS:Sheila Vernon  ANESTHESIA:   general  EBL:  Total I/O In: 5700 [I.V.:4000; Blood:700; IV Piggyback:1000] Out: 1350 [Urine:400; Blood:950]  BLOOD ADMINISTERED:2 units prbc  DRAINS: (knee) Hemovact drain(s) in the l knee with  Suction Clamped   LOCAL MEDICATIONS USED:  NONE  SPECIMEN:  cxs x 2 , synovium x 1 to path   4 wbc per hpf  DISPOSITION OF SPECIMEN:  PATHOLOGY  COUNTS:  YES  TOURNIQUET:  * Missing tourniquet times found for documented tourniquets in log:  6326 *  DICTATION: .Other Dictation: Dictation Number [id number]  PLAN OF CARE: Admit to inpatient   PATIENT DISPOSITION:  PACU - hemodynamically stable.   Delay start of Pharmacological VTE agent (>24hrs) due to surgical blood loss or risk of bleeding:  not applicable

## 2011-05-23 NOTE — Anesthesia Preprocedure Evaluation (Addendum)
Anesthesia Evaluation  Patient identified by MRN, date of birth, ID band Patient awake    Reviewed: Allergy & Precautions, H&P , NPO status , Patient's Chart, lab work & pertinent test results, reviewed documented beta blocker date and time   Airway Mallampati: II TM Distance: >3 FB Neck ROM: Full    Dental  (+) Edentulous Lower, Dental Advisory Given and Edentulous Upper   Pulmonary shortness of breath and with exertion, asthma (inhalers) , sleep apnea (CPAP, but rarely uses) and Continuous Positive Airway Pressure Ventilation , COPD COPD inhaler, former smoker clear to auscultation  Pulmonary exam normal       Cardiovascular hypertension, Pt. on medications + DOE Regular Normal    Neuro/Psych    GI/Hepatic   Endo/Other    Renal/GU Pt has positive urine cx for Pseudomonas.     Musculoskeletal   Abdominal   Peds  Hematology Recent DVT left leg. Removed blood clot last week, also had [place] Filter placed. Pt is not on anticoagulants.   Anesthesia Other Findings   Reproductive/Obstetrics                      Anesthesia Physical Anesthesia Plan  ASA: III  Anesthesia Plan: General   Post-op Pain Management:    Induction: Intravenous  Airway Management Planned: Oral ETT  Additional Equipment:   Intra-op Plan:   Post-operative Plan: Extubation in OR  Informed Consent: I have reviewed the patients History and Physical, chart, labs and discussed the procedure including the risks, benefits and alternatives for the proposed anesthesia with the patient or authorized representative who has indicated his/her understanding and acceptance.   Dental advisory given  Plan Discussed with: Anesthesiologist  Anesthesia Plan Comments: (Dr. Krista Blue obtained consent)      Anesthesia Quick Evaluation

## 2011-05-23 NOTE — Preoperative (Signed)
Beta Blockers   Reason not to administer Beta Blockers:Not Applicable 

## 2011-05-24 LAB — CBC
HCT: 25.4 % — ABNORMAL LOW (ref 39.0–52.0)
Hemoglobin: 8.4 g/dL — ABNORMAL LOW (ref 13.0–17.0)
WBC: 7.6 10*3/uL (ref 4.0–10.5)

## 2011-05-24 LAB — BASIC METABOLIC PANEL
BUN: 18 mg/dL (ref 6–23)
Chloride: 103 mEq/L (ref 96–112)
Glucose, Bld: 86 mg/dL (ref 70–99)
Potassium: 3.8 mEq/L (ref 3.5–5.1)

## 2011-05-24 MED ORDER — POTASSIUM CHLORIDE 20 MEQ PO PACK
20.0000 meq | PACK | Freq: Every day | ORAL | Status: DC
  Administered 2011-05-24: 20 meq via ORAL
  Filled 2011-05-24 (×2): qty 1

## 2011-05-24 MED ORDER — MONTELUKAST SODIUM 10 MG PO TABS
10.0000 mg | ORAL_TABLET | Freq: Every day | ORAL | Status: DC
  Administered 2011-05-24 – 2011-05-28 (×5): 10 mg via ORAL
  Filled 2011-05-24 (×6): qty 1

## 2011-05-24 MED ORDER — MECLIZINE HCL 25 MG PO TABS
25.0000 mg | ORAL_TABLET | Freq: Four times a day (QID) | ORAL | Status: DC | PRN
  Filled 2011-05-24: qty 1

## 2011-05-24 MED ORDER — HYDROCODONE-ACETAMINOPHEN 5-325 MG PO TABS
1.0000 | ORAL_TABLET | Freq: Four times a day (QID) | ORAL | Status: DC | PRN
  Administered 2011-05-24 – 2011-05-29 (×11): 1 via ORAL
  Filled 2011-05-24 (×2): qty 1
  Filled 2011-05-24: qty 2
  Filled 2011-05-24 (×8): qty 1

## 2011-05-24 MED ORDER — POLYETHYLENE GLYCOL 3350 17 G PO PACK
17.0000 g | PACK | Freq: Every day | ORAL | Status: DC
  Administered 2011-05-25 – 2011-05-29 (×5): 17 g via ORAL
  Filled 2011-05-24 (×6): qty 1

## 2011-05-24 MED ORDER — ROPINIROLE HCL 1 MG PO TABS
1.0000 mg | ORAL_TABLET | Freq: Every day | ORAL | Status: DC
  Administered 2011-05-24 – 2011-05-28 (×5): 1 mg via ORAL
  Filled 2011-05-24 (×6): qty 1

## 2011-05-24 MED ORDER — VANCOMYCIN HCL 500 MG IV SOLR
500.0000 mg | Freq: Two times a day (BID) | INTRAVENOUS | Status: AC
  Administered 2011-05-24 – 2011-05-25 (×3): 500 mg via INTRAVENOUS
  Filled 2011-05-24 (×3): qty 500

## 2011-05-24 MED ORDER — DOXAZOSIN MESYLATE 4 MG PO TABS
4.0000 mg | ORAL_TABLET | Freq: Two times a day (BID) | ORAL | Status: DC
  Administered 2011-05-25 – 2011-05-29 (×9): 4 mg via ORAL
  Filled 2011-05-24: qty 0.5
  Filled 2011-05-24: qty 1
  Filled 2011-05-24: qty 0.5
  Filled 2011-05-24: qty 1
  Filled 2011-05-24: qty 0.5
  Filled 2011-05-24 (×3): qty 1
  Filled 2011-05-24: qty 0.5
  Filled 2011-05-24 (×6): qty 1

## 2011-05-24 MED ORDER — ZOLPIDEM TARTRATE 5 MG PO TABS: 5.0000 mg | ORAL_TABLET | Freq: Every evening | ORAL | Status: DC | PRN

## 2011-05-24 MED ORDER — TRIAMTERENE-HCTZ 75-50 MG PO TABS
1.0000 | ORAL_TABLET | Freq: Every day | ORAL | Status: DC
  Administered 2011-05-24 – 2011-05-29 (×5): 1 via ORAL
  Filled 2011-05-24 (×6): qty 1

## 2011-05-24 MED ORDER — DIAZEPAM 5 MG PO TABS
5.0000 mg | ORAL_TABLET | Freq: Three times a day (TID) | ORAL | Status: DC | PRN
  Administered 2011-05-25: 5 mg via ORAL
  Filled 2011-05-24: qty 1

## 2011-05-24 MED ORDER — SOLIFENACIN SUCCINATE 5 MG PO TABS
5.0000 mg | ORAL_TABLET | Freq: Every day | ORAL | Status: DC
  Filled 2011-05-24 (×2): qty 1

## 2011-05-24 MED ORDER — ALBUTEROL SULFATE HFA 108 (90 BASE) MCG/ACT IN AERS
1.0000 | INHALATION_SPRAY | Freq: Four times a day (QID) | RESPIRATORY_TRACT | Status: DC | PRN
  Filled 2011-05-24: qty 6.7

## 2011-05-24 MED ORDER — CALCIUM CARBONATE 600 MG PO TABS: 600.0000 mg | ORAL_TABLET | Freq: Every day | ORAL | Status: DC

## 2011-05-24 MED ORDER — FLUTICASONE-SALMETEROL 250-50 MCG/DOSE IN AEPB
1.0000 | INHALATION_SPRAY | Freq: Two times a day (BID) | RESPIRATORY_TRACT | Status: DC
  Administered 2011-05-24 – 2011-05-29 (×11): 1 via RESPIRATORY_TRACT
  Filled 2011-05-24: qty 14

## 2011-05-24 MED ORDER — SIMVASTATIN 40 MG PO TABS
40.0000 mg | ORAL_TABLET | Freq: Every day | ORAL | Status: DC
  Administered 2011-05-24 – 2011-05-28 (×5): 40 mg via ORAL
  Filled 2011-05-24 (×6): qty 1

## 2011-05-24 MED ORDER — CALCIUM CARBONATE 1250 (500 CA) MG PO TABS
500.0000 mg | ORAL_TABLET | Freq: Every day | ORAL | Status: DC
  Administered 2011-05-24 – 2011-05-29 (×6): 500 mg via ORAL
  Filled 2011-05-24 (×6): qty 1

## 2011-05-24 MED ORDER — DARIFENACIN HYDROBROMIDE ER 7.5 MG PO TB24
7.5000 mg | ORAL_TABLET | Freq: Every day | ORAL | Status: DC
  Administered 2011-05-24 – 2011-05-29 (×6): 7.5 mg via ORAL
  Filled 2011-05-24 (×6): qty 1

## 2011-05-24 MED ORDER — BUPROPION HCL ER (SR) 100 MG PO TB12
100.0000 mg | ORAL_TABLET | Freq: Two times a day (BID) | ORAL | Status: DC
  Administered 2011-05-24 – 2011-05-29 (×11): 100 mg via ORAL
  Filled 2011-05-24 (×12): qty 1

## 2011-05-24 NOTE — Progress Notes (Signed)
Subjective: 1 Day Post-Op Procedure(s) (LRB): TOTAL KNEE REVISION (Left) Patient reports pain as 2 on 0-10 scale.   Patient stable - feels good this am Objective: Vital signs in last 24 hours: Temp:  [97.2 F (36.2 C)-98.8 F (37.1 C)] 98.8 F (37.1 C) (11/07 0707) Pulse Rate:  [69-97] 88  (11/07 0707) Resp:  [8-21] 20  (11/07 0707) BP: (92-125)/(44-93) 92/55 mmHg (11/07 0707) SpO2:  [96 %-100 %] 97 % (11/07 0707) FiO2 (%):  [2 %] 2 % (11/07 0707)  Intake/Output from previous day: 11/06 0701 - 11/07 0700 In: 6886.3 [P.O.:415; I.V.:4771.3; Blood:700; IV Piggyback:1000] Out: 1700 [Urine:400; Drains:350; Blood:950] Intake/Output this shift:     Basename 05/24/11 0545 05/23/11 1355 05/23/11 1027  HGB 8.4* 9.3* 8.2*    Basename 05/24/11 0545 05/23/11 1355  WBC 7.6 11.3*  RBC 2.72* 3.03*  HCT 25.4* 28.1*  PLT 180 182    Basename 05/24/11 0545 05/23/11 1027  NA 137 138  K 3.8 3.8  CL 103 --  CO2 30 --  BUN 18 --  CREATININE 1.11 --  GLUCOSE 86 119*  CALCIUM 8.4 --   No results found for this basename: LABPT:2,INR:2 in the last 72 hours  l foot perfused mobile sensate hv d/ced   Assessment/Plan: 1 Day Post-Op Procedure(s) (LRB): TOTAL KNEE REVISION (Left) Up with therapy hgb decreased will follow - begin cpm today snf vs dc home next week  Micheal Frank 05/24/2011, 8:36 AM

## 2011-05-24 NOTE — Progress Notes (Signed)
Physical Therapy Evaluation Patient Details Name: Micheal Frank MRN: [id number] DOB: [birth year] Today's Date: 05/24/2011  Problem List:  Patient Active Problem List  Diagnoses  . CARCINOMA IN SITU, SKIN  . RESTLESS LEG SYNDROME  . NEUROPATHY  . GLAUCOMA, BORDERLINE  . MENIERE'S DISEASE  . HEARING LOSS  . ALLERGIC RHINITIS  . EMPHYSEMA  . ASTHMA  . COPD  . ARTHRITIS  . OSTEOPOROSIS  . DIZZINESS  . SLEEP APNEA  . HISTORY OF ASBESTOS EXPOSURE  . Spinal epidural abscess  . Acute osteomyelitis of lower leg  . Prosthetic joint infection, left knee  . Lower leg edema  . Staphylococcus aureus bacteremia with sepsis    Past Medical History:  Past Medical History  Diagnosis Date  . Subdural hematoma 03/30/11  . Epidural abscess   . Staphylococcus aureus bacteremia with sepsis   . Prosthetic joint infection   . Hyperlipidemia   . History of blood clots   . Blood dyscrasia     pt CAN NOT have any blood thinners d/t hx brain bleed  . S/P right heart catheterization     at least 59yrs  . Asthma   . COPD (chronic obstructive pulmonary disease)     emphysema  . Shortness of breath     with exertion  . Pneumonia     as a child and in 1952;had a pneumonia vaccine couple of years ago  . Peripheral neuropathy   . Short-term memory loss   . Arthritis     left leg also has swelling  . Gastric ulcer   . Constipation     miralax prn  . Depression     takes wellbutrin bid  . Urinary frequency     wears depends  . Complication of anesthesia     confusion with anesthesia  . Sleep apnea     has CPAP but doesn't use it;sleep study done at least 18yrs ago   Past Surgical History:  Past Surgical History  Procedure Date  . Hernia repair     25+yrs ago  . 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 Total knee arthroplasty     x 2 left knee 2003/2012  . Filtering procedure     filter placed in neck d/t blood  clot  . Eye surgery     bil cataract  surgery  . Skin biopsy     melanoma on head   . Knee arthroscopy     PT Assessment/Plan/Recommendation PT Assessment Clinical Impression Statement: Pt presents with decreased activity tolerance, decreased strength/rom, and mobility. Pt would benefit from skilled PT to address the areas listed and return pt to highest functioning lavel and follow-up HHPT. PT Recommendation/Assessment: Patient will need skilled PT in the acute care venue PT Problem List: Decreased strength;Decreased range of motion;Decreased activity tolerance;Decreased mobility;Decreased safety awareness PT Therapy Diagnosis : Difficulty walking PT Plan PT Frequency: 7X/week PT Treatment/Interventions: DME instruction;Gait training;Therapeutic exercise;Functional mobility training;Balance training;Patient/family education PT Recommendation Recommendations for Other Services: OT consult Follow Up Recommendations: Home health PT Equipment Recommended: None recommended by PT PT Goals  Acute Rehab PT Goals PT Goal Formulation: With patient Time For Goal Achievement: 7 days Pt will go Supine/Side to Sit: with modified independence PT Goal: Supine/Side to Sit - Progress: Progressing toward goal Pt will Transfer Sit to Stand/Stand to Sit: with modified independence;with upper extremity assist PT Transfer Goal: Sit to Stand/Stand to Sit - Progress: Progressing toward goal  Pt will Ambulate: >150 feet;with modified independence;with least restrictive assistive device PT Goal: Ambulate - Progress: Progressing toward goal Pt will Perform Home Exercise Program: Independently PT Goal: Perform Home Exercise Program - Progress: Progressing toward goal  PT Evaluation Precautions/Restrictions  Precautions Precautions: Knee Required Braces or Orthoses: Yes Knee Immobilizer: On except when in CPM;Other (comment) (d/c in bed after 2 days) Restrictions Weight Bearing Restrictions: No (WBAT) Prior Functioning  Home Living Lives  With: Spouse Receives Help From: Family Type of Home: House Home Layout: Two level;Able to live on main level with bedroom/bathroom (basement) Home Access: Ramped entrance Home Adaptive Equipment: Walker - rolling;Wheelchair - manual;Bedside commode/3-in-1 Prior Function Level of Independence: Independent with gait Driving: Yes Cognition Cognition Arousal/Alertness: Awake/alert Overall Cognitive Status: Appears within functional limits for tasks assessed Orientation Level: Oriented X4 Sensation/Coordination   Extremity Assessment RLE Assessment RLE Assessment: Within Functional Limits LLE Assessment LLE Assessment: Exceptions to WFL LLE AROM (degrees) Left Knee Extension 0-130:  (-5 degress full extension) Left Knee Flexion 0-140: 40  Mobility (including Balance) Bed Mobility Bed Mobility: Yes Supine to Sit: 4: Min assist Supine to Sit Details (indicate cue type and reason): assistance to lift LLE, cues for technique Transfers Transfers: Yes Sit to Stand: 4: Min assist;From bed;With upper extremity assist Sit to Stand Details (indicate cue type and reason): cues for technique and hand placement Stand to Sit: 4: Min assist;With upper extremity assist;To chair/3-in-1 Stand to Sit Details: cues for technique and safety Ambulation/Gait Ambulation/Gait: Yes Ambulation/Gait Assistance: 4: Min assist Ambulation/Gait Assistance Details (indicate cue type and reason): Pt became pale and less responsive with gait and required to sit down for safety. BP checked and low. Ambulation Distance (Feet): 5 Feet Assistive device: Rolling walker Gait Pattern: Decreased weight shift to left;Decreased stance time - left;Decreased step length - right;Step-to pattern Gait velocity: decreased  Posture/Postural Control Posture/Postural Control: No significant limitations Exercise  Total Joint Exercises Ankle Circles/Pumps: AROM;15 reps;Supine Quad Sets: AROM;15 reps;Seated Heel Slides: AAROM;10  reps;Seated Straight Leg Raises: AAROM;10 reps;Supine End of Session PT - End of Session Equipment Utilized During Treatment: Gait belt;Left knee immobilizer Activity Tolerance: Treatment limited secondary to medical complications (Comment) (lightheaded with gait had to sit down, BP low) Patient left: in chair;with call bell in reach Nurse Communication: Mobility status for transfers;Mobility status for ambulation General Behavior During Session: [facility] for tasks performed Cognition: [facility] for tasks performed  Greggory Stallion 05/24/2011, 12:13 PM

## 2011-05-24 NOTE — Consult Note (Signed)
ANTIBIOTIC CONSULT NOTE - INITIAL  Pharmacy Consult for Vancomycin Indication: Post-op antibiotic coverage s/p L-Total Knee Revision  Allergies  Allergen Reactions  . Oysters (Shellfish Allergy) Other (See Comments)    Vomiting and passed    Patient Measurements: Height: 5\' 9"  (175.3 cm) Weight: 130 lb 1.1 oz (59 kg) IBW/kg (Calculated) : 70.7    Vital Signs: Temp: 98.8 F (37.1 C) (11/07 0707) BP: 92/55 mmHg (11/07 0707) Pulse Rate: 88  (11/07 0707) Intake/Output from previous day: 11/06 0701 - 11/07 0700 In: 6886.3 [P.O.:415; I.V.:4771.3; Blood:700; IV Piggyback:1000] Out: 1700 [Urine:400; Drains:350; Blood:950] Intake/Output from this shift: Total I/O In: -  Out: 325 [Drains:325]  Labs:  [facility] 05/24/11 0545 05/23/11 1355 05/23/11 1027  WBC 7.6 11.3* --  HGB 8.4* 9.3* 8.2*  PLT 180 182 --  LABCREA -- -- --  CREATININE 1.11 -- --   Estimated Creatinine Clearance: 42.1 ml/min (by C-G formula based on Cr of 1.11).   Microbiology: Recent Results (from the past 720 hour(s))  MRSA PCR SCREENING     Status: Normal   Collection Time   04/27/11  6:13 AM      Component Value Range Status Comment   MRSA by PCR NEGATIVE  NEGATIVE  Final   URINE CULTURE     Status: Normal   Collection Time   05/18/11  1:58 PM      Component Value Range Status Comment   Specimen Description URINE, CLEAN CATCH   Final    Special Requests NONE   Final    Setup Time [id number]   Final    Colony Count >=100,000 COLONIES/ML   Final    Culture PSEUDOMONAS AERUGINOSA   Final    Report Status 05/21/2011 FINAL   Final    Organism ID, Bacteria PSEUDOMONAS AERUGINOSA   Final   GRAM STAIN     Status: Normal   Collection Time   05/23/11  8:29 AM      Component Value Range Status Comment   Specimen Description FLUID SYNOVIAL LEFT KNEE   Final    Special Requests NONE   Final    Gram Stain     Final    Value: RARE WBC PRESENT,BOTH PMN AND MONONUCLEAR     NO ORGANISMS SEEN     CALLED TO  Leanne Chang J RN 05/23/11 0930 COSTELLO B   Report Status 05/23/2011 FINAL   Final   BODY FLUID CULTURE     Status: Normal (Preliminary result)   Collection Time   05/23/11  8:29 AM      Component Value Range Status Comment   Specimen Description FLUID SYNOVIAL LEFT KNEE   Final    Special Requests NONE   Final    Gram Stain     Final    Value: RARE WBC PRESENT,BOTH PMN AND MONONUCLEAR     NO ORGANISMS SEEN     CALLED TO Leanne Chang J RN 05/23/11 0930 COSTELLO B Performed at [facility]   Culture NO GROWTH 1 DAY   Final    Report Status PENDING   Incomplete   ANAEROBIC CULTURE     Status: Normal (Preliminary result)   Collection Time   05/23/11  8:29 AM      Component Value Range Status Comment   Specimen Description FLUID SYNOVIAL LEFT KNEE   Final    Special Requests NONE   Final    Gram Stain     Final    Value: RARE WBC PRESENT,BOTH PMN AND  MONONUCLEAR     NO ORGANISMS SEEN     CALLED TO Francene Finders RN 05/23/11 0930 COSTELLO B  Performed at [facility]   Culture PENDING   Incomplete    Report Status PENDING   Incomplete   GRAM STAIN     Status: Normal   Collection Time   05/23/11  8:30 AM      Component Value Range Status Comment   Specimen Description FLUID SYNOVIAL LEFT KNEE   Final    Special Requests NONE   Final    Gram Stain     Final    Value: RARE WBC PRESENT,BOTH PMN AND MONONUCLEAR     NO ORGANISMS SEEN     CALLED TO Francene Finders RN 05/23/11 0930 COSTELLO B   Report Status 05/23/2011 FINAL   Final   BODY FLUID CULTURE     Status: Normal (Preliminary result)   Collection Time   05/23/11  8:30 AM      Component Value Range Status Comment   Specimen Description FLUID SYNOVIAL LEFT KNEE   Final    Special Requests NONE   Final    Gram Stain     Final    Value: NO WBC SEEN     NO ORGANISMS SEEN   Culture PENDING   Incomplete    Report Status PENDING   Incomplete     Medical History: Past Medical History  Diagnosis Date  . Subdural hematoma 03/30/11  . Epidural abscess    . Staphylococcus aureus bacteremia with sepsis   . Prosthetic joint infection   . Hyperlipidemia   . History of blood clots   . Blood dyscrasia     pt CAN NOT have any blood thinners d/t hx brain bleed  . S/P right heart catheterization     at least 15yrs  . Asthma   . COPD (chronic obstructive pulmonary disease)     emphysema  . Shortness of breath     with exertion  . Pneumonia     as a child and in 1952;had a pneumonia vaccine couple of years ago  . Peripheral neuropathy   . Short-term memory loss   . Arthritis     left leg also has swelling  . Gastric ulcer   . Constipation     miralax prn  . Depression     takes wellbutrin bid  . Urinary frequency     wears depends  . Complication of anesthesia     confusion with anesthesia  . Sleep apnea     has CPAP but doesn't use it;sleep study done at least 17yrs ago    Medications:  Scheduled:    . buPROPion  100 mg Oral BID  . calcium carbonate  500 mg of elemental calcium Oral Daily  . cloNIDine  150 mcg Intra-articular Once  . docusate sodium  100 mg Oral BID  . doxazosin  4 mg Oral BID  . Fluticasone-Salmeterol  1 puff Inhalation Q12H  . montelukast  10 mg Oral QHS  . polyethylene glycol  17 g Oral Daily  . potassium chloride  20 mEq Oral Daily  . rOPINIRole  1 mg Oral QHS  . simvastatin  40 mg Oral QHS  . solifenacin  5 mg Oral Daily  . triamterene-hydrochlorothiazide  1 tablet Oral Daily  . DISCONTD: calcium carbonate  600 mg Oral Daily  . DISCONTD: ciprofloxacin  400 mg Intravenous Q12H  . DISCONTD: vancomycin  1,000 mg Intravenous 60 min Pre-Op  Assessment: Post-op Day #1 s/p L-TK Revision 8wks after TKA removal.  Goal of Therapy:  Vancomycin levels ~ 22mcg/ml  Plan:  Vancomycin 500mg  IV q 12 hours x 3 doses.    Tynlee Bayle, Elisha Headland, Pharm.D. 05/24/2011 11:21 AM

## 2011-05-25 ENCOUNTER — Other Ambulatory Visit: Payer: Self-pay | Admitting: Critical Care Medicine

## 2011-05-25 LAB — CBC
MCH: 30.9 pg (ref 26.0–34.0)
Platelets: 156 10*3/uL (ref 150–400)
RBC: 2.36 MIL/uL — ABNORMAL LOW (ref 4.22–5.81)
WBC: 9.6 10*3/uL (ref 4.0–10.5)

## 2011-05-25 LAB — PREPARE RBC (CROSSMATCH)

## 2011-05-25 MED ORDER — POTASSIUM CHLORIDE CRYS ER 20 MEQ PO TBCR
20.0000 meq | EXTENDED_RELEASE_TABLET | Freq: Every day | ORAL | Status: DC
  Administered 2011-05-25 – 2011-05-29 (×5): 20 meq via ORAL
  Filled 2011-05-25 (×6): qty 1

## 2011-05-25 MED ORDER — ACETAMINOPHEN 325 MG PO TABS
650.0000 mg | ORAL_TABLET | Freq: Once | ORAL | Status: AC
  Administered 2011-05-25: 650 mg via ORAL

## 2011-05-25 MED ORDER — FUROSEMIDE 10 MG/ML IJ SOLN
20.0000 mg | Freq: Once | INTRAMUSCULAR | Status: AC
  Administered 2011-05-25: 20 mg via INTRAVENOUS
  Filled 2011-05-25: qty 2

## 2011-05-25 NOTE — Progress Notes (Signed)
Physical Therapy Treatment Patient Details Name: Micheal Frank MRN: [id number] DOB: [birth year] Today's Date: 05/25/2011  PT Assessment/Plan  PT - Assessment/Plan PT Plan: Discharge plan remains appropriate;Frequency remains appropriate PT Frequency: 7X/week Follow Up Recommendations: Home health PT Equipment Recommended: None recommended by PT PT Goals  Acute Rehab PT Goals PT Goal: Supine/Side to Sit - Progress: Progressing toward goal PT Transfer Goal: Sit to Stand/Stand to Sit - Progress: Progressing toward goal PT Goal: Ambulate - Progress: Progressing toward goal PT Goal: Perform Home Exercise Program - Progress: Progressing toward goal  PT Treatment Precautions/Restrictions  Precautions Precautions: Knee Required Braces or Orthoses: Yes Knee Immobilizer: On except when in CPM;Other (comment) Restrictions Weight Bearing Restrictions: Yes LLE Weight Bearing: Weight bearing as tolerated Mobility (including Balance) Bed Mobility Bed Mobility: Yes Supine to Sit: 4: Min assist Transfers Transfers: Yes Sit to Stand: 4: Min assist;From bed;With upper extremity assist Sit to Stand Details (indicate cue type and reason): cues for UE support Stand to Sit: 4: Min assist;With upper extremity assist;To chair/3-in-1 Stand to Sit Details: Cues to control descent Ambulation/Gait Ambulation/Gait: Yes Ambulation/Gait Assistance: 4: Min assist Ambulation/Gait Assistance Details (indicate cue type and reason): cues for sequencing, upright posture Ambulation Distance (Feet): 110 Feet Assistive device: Rolling walker Gait Pattern: Antalgic Stairs: No    Exercise  Total Joint Exercises Ankle Circles/Pumps: AROM;Both;10 reps;Seated Quad Sets: AROM;Both;10 reps;Seated General Exercises - Lower Extremity Short Arc Quad: AROM;Left;10 reps;Seated Long Arc Quad: AROM;Left;10 reps;Seated End of Session PT - End of Session Equipment Utilized During Treatment: Gait belt;Left knee  immobilizer Activity Tolerance: Patient tolerated treatment well Patient left: in chair;with call bell in reach General Behavior During Session: [facility] for tasks performed Cognition: [facility] for tasks performed  [facility] 05/25/2011, 3:04 PM

## 2011-05-25 NOTE — Progress Notes (Signed)
Occupational Therapy Evaluation Patient Details Name: Micheal Frank MRN: [id number] DOB: [birth year] Today's Date: 05/25/2011  Problem List:  Patient Active Problem List  Diagnoses  . CARCINOMA IN SITU, SKIN  . RESTLESS LEG SYNDROME  . NEUROPATHY  . GLAUCOMA, BORDERLINE  . MENIERE'S DISEASE  . HEARING LOSS  . ALLERGIC RHINITIS  . EMPHYSEMA  . ASTHMA  . COPD  . ARTHRITIS  . OSTEOPOROSIS  . DIZZINESS  . SLEEP APNEA  . HISTORY OF ASBESTOS EXPOSURE  . Spinal epidural abscess  . Acute osteomyelitis of lower leg  . Prosthetic joint infection, left knee  . Lower leg edema  . Staphylococcus aureus bacteremia with sepsis    Past Medical History:  Past Medical History  Diagnosis Date  . Subdural hematoma 03/30/11  . Epidural abscess   . Staphylococcus aureus bacteremia with sepsis   . Prosthetic joint infection   . Hyperlipidemia   . History of blood clots   . Blood dyscrasia     pt CAN NOT have any blood thinners d/t hx brain bleed  . S/P right heart catheterization     at least 44yrs  . Asthma   . COPD (chronic obstructive pulmonary disease)     emphysema  . Shortness of breath     with exertion  . Pneumonia     as a child and in 1952;had a pneumonia vaccine couple of years ago  . Peripheral neuropathy   . Short-term memory loss   . Arthritis     left leg also has swelling  . Gastric ulcer   . Constipation     miralax prn  . Depression     takes wellbutrin bid  . Urinary frequency     wears depends  . Complication of anesthesia     confusion with anesthesia  . Sleep apnea     has CPAP but doesn't use it;sleep study done at least 66yrs ago   Past Surgical History:  Past Surgical History  Procedure Date  . Hernia repair     25+yrs ago  . 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 Total knee arthroplasty     x 2 left knee 2003/2012  . Filtering procedure     filter placed in neck d/t blood  clot  . Eye surgery     bil  cataract surgery  . Skin biopsy     melanoma on head   . Knee arthroscopy     OT Assessment/Plan/Recommendation OT Assessment Clinical Impression Statement: Pt will benefit from OT in the acute care setting to increaes I with ADLs and to increase I with functional transfers in prep for d/c home with family. OT Recommendation/Assessment: Patient will need skilled OT in the acute care venue OT Problem List: Decreased activity tolerance;Impaired balance (sitting and/or standing);Decreased knowledge of use of DME or AE;Pain OT Therapy Diagnosis : Acute pain;Generalized weakness OT Plan OT Frequency: Min 2X/week OT Treatment/Interventions: Self-care/ADL training;DME and/or AE instruction;Therapeutic activities;Patient/family education;Balance training OT Recommendation Follow Up Recommendations: Home health OT Equipment Recommended: None recommended by PT Individuals Consulted Consulted and Agree with Results and Recommendations: Patient OT Goals Acute Rehab OT Goals OT Goal Formulation: With patient Time For Goal Achievement: 7 days ADL Goals Pt Will Perform Grooming: with modified independence;Standing at sink;Other (comment) (3 tasks at sink) ADL Goal: Grooming - Progress: Other (comment) Pt Will Perform Lower Body Bathing: with modified independence;Sit to stand from bed ADL Goal: Lower  Body Bathing - Progress: Other (comment) Pt Will Perform Lower Body Dressing: with modified independence;Sit to stand from bed;with adaptive equipment;Other (comment) (AE as needed) ADL Goal: Lower Body Dressing - Progress: Other (comment) Pt Will Transfer to Toilet: with modified independence;Ambulation;with DME;3-in-1 ADL Goal: Toilet Transfer - Progress: Other (comment)  OT Evaluation Precautions/Restrictions  Precautions Precautions: Knee Required Braces or Orthoses: Yes Knee Immobilizer: On except when in CPM;Other (comment) Restrictions Weight Bearing Restrictions: Yes LLE Weight  Bearing: Weight bearing as tolerated Prior Functioning Home Living Lives With: Spouse Receives Help From: Family Type of Home: House Home Layout: Two level;Able to live on main level with bedroom/bathroom Home Access: Ramped entrance Bathroom Shower/Tub: Engineer, manufacturing systems: Standard Home Adaptive Equipment: Walker - rolling;Wheelchair - manual;Bedside commode/3-in-1;Tub transfer bench Prior Function Level of Independence: Independent with gait;Independent with basic ADLs Driving: Yes ADL ADL Eating/Feeding: Simulated;Independent Where Assessed - Eating/Feeding: Chair Grooming: Performed;Wash/dry face;Supervision/safety Where Assessed - Grooming: Standing at sink Upper Body Bathing: Simulated;Set up Upper Body Bathing Details (indicate cue type and reason): Setup assist to gather bathing supplies. Where Assessed - Upper Body Bathing: Sitting, bed Lower Body Bathing: Simulated;Moderate assistance Where Assessed - Lower Body Bathing: Sit to stand from bed Upper Body Dressing: Simulated;Independent Where Assessed - Upper Body Dressing: Sitting, bed Lower Body Dressing: Simulated;Moderate assistance Where Assessed - Lower Body Dressing: Sit to stand from chair Toilet Transfer: Simulated;Minimal assistance Toilet Transfer Details (indicate cue type and reason): Min assist for balance and decreased activity tolerance Toilet Transfer Method: Ambulating Toileting - Clothing Manipulation: Simulated;Supervision/safety Where Assessed - Toileting Clothing Manipulation: Standing Toileting - Hygiene: Simulated;Supervision/safety Where Assessed - Toileting Hygiene: Sit on 3-in-1 or toilet;Standing Tub/Shower Transfer: Not assessed Tub/Shower Transfer Method: Not assessed Equipment Used: Rolling walker Vision/Perception    Cognition Cognition Arousal/Alertness: Awake/alert Overall Cognitive Status: Appears within functional limits for tasks assessed Orientation Level:  Oriented X4 Sensation/Coordination   Extremity Assessment RUE Assessment RUE Assessment: Within Functional Limits LUE Assessment LUE Assessment: Within Functional Limits Mobility  Bed Mobility Bed Mobility: Yes Supine to Sit: 4: Min assist Transfers Transfers: Yes Sit to Stand: 4: Min assist;From bed;With upper extremity assist Sit to Stand Details (indicate cue type and reason): cues for UE support Stand to Sit: 4: Min assist;With upper extremity assist;To chair/3-in-1 Stand to Sit Details: Cues to control descent Exercises Total Joint Exercises Ankle Circles/Pumps: AROM;Both;10 reps;Seated Quad Sets: AROM;Both;10 reps;Seated General Exercises - Lower Extremity Short Arc Quad: AROM;Left;10 reps;Seated Long Arc Quad: AROM;Left;10 reps;Seated End of Session OT - End of Session Equipment Utilized During Treatment: Gait belt Activity Tolerance: Patient limited by fatigue Patient left: in chair;with call bell in reach General Behavior During Session: [facility] for tasks performed Cognition: [facility] for tasks performed   Cipriano Mile 05/25/2011, 3:04 PM

## 2011-05-25 NOTE — Progress Notes (Signed)
OT Cancel Note Patient began blood transfusion at 10:45. Will re-attempt OT evaluation this afternoon.

## 2011-05-25 NOTE — Progress Notes (Signed)
Subjective: 2 Days Post-Op Procedure(s) (LRB): TOTAL KNEE REVISION (Left) Patient reports pain as 2 on 0-10 scale.   Pt stable no pain tol cpm well  Objective: Vital signs in last 24 hours: Temp:  [98.4 F (36.9 C)-98.7 F (37.1 C)] 98.4 F (36.9 C) (11/08 0630) Pulse Rate:  [83-92] 83  (11/08 0630) Resp:  [16-17] 16  (11/08 0630) BP: (92-99)/(42-58) 99/58 mmHg (11/08 0630) SpO2:  [90 %-99 %] 99 % (11/08 0630) Weight:  [59 kg (130 lb 1.1 oz)] 130 lb 1.1 oz (59 kg) (11/07 1100)  Intake/Output from previous day: 11/07 0701 - 11/08 0700 In: 1935 [P.O.:120; I.V.:1815] Out: 327 [Urine:2; Drains:325] Intake/Output this shift:     Basename 05/25/11 0630 05/24/11 0545 05/23/11 1355 05/23/11 1027  HGB 7.3* 8.4* 9.3* 8.2*    Basename 05/25/11 0630 05/24/11 0545  WBC 9.6 7.6  RBC 2.36* 2.72*  HCT 22.2* 25.4*  PLT 156 180    Basename 05/24/11 0545 05/23/11 1027  NA 137 138  K 3.8 3.8  CL 103 --  CO2 30 --  BUN 18 --  CREATININE 1.11 --  GLUCOSE 86 119*  CALCIUM 8.4 --   No results found for this basename: LABPT:2,INR:2 in the last 72 hours  Neurovascular intact Sensation intact distally Intact pulses distally Dorsiflexion/Plantar flexion intact  Assessment/Plan: 2 Days Post-Op Procedure(s) (LRB): TOTAL KNEE REVISION (Left) Pt with low hgb  Plan tx 2 u prbc hl iv cont cpm and pt poss dc 2 - 3 days recheck hgb am and bmet am  [name],[name] 05/25/2011, 8:35 AM

## 2011-05-26 LAB — BODY FLUID CULTURE
Culture: NO GROWTH
Culture: NO GROWTH
Gram Stain: NONE SEEN

## 2011-05-26 LAB — PREPARE RBC (CROSSMATCH)

## 2011-05-26 LAB — CBC
Platelets: 174 10*3/uL (ref 150–400)
RBC: 3.09 MIL/uL — ABNORMAL LOW (ref 4.22–5.81)
WBC: 9.5 10*3/uL (ref 4.0–10.5)

## 2011-05-26 LAB — TYPE AND SCREEN
Unit division: 0
Unit division: 0

## 2011-05-26 MED ORDER — VANCOMYCIN HCL 500 MG IV SOLR
500.0000 mg | Freq: Two times a day (BID) | INTRAVENOUS | Status: DC
  Administered 2011-05-27 – 2011-05-29 (×5): 500 mg via INTRAVENOUS
  Filled 2011-05-26 (×7): qty 500

## 2011-05-26 NOTE — Progress Notes (Signed)
Physical Therapy Treatment Patient Details Name: Micheal Frank MRN: [id number] DOB: September 09, 1925 Today's Date: 05/26/2011  PT Assessment/Plan  PT - Assessment/Plan Comments on Treatment Session: pt very painful with mobility PT Plan: Discharge plan remains appropriate;Frequency remains appropriate PT Frequency: 7X/week Follow Up Recommendations: Home health PT Equipment Recommended: None recommended by PT PT Goals  Acute Rehab PT Goals PT Goal: Supine/Side to Sit - Progress: Progressing toward goal PT Transfer Goal: Sit to Stand/Stand to Sit - Progress: Progressing toward goal PT Goal: Ambulate - Progress: Progressing toward goal PT Goal: Perform Home Exercise Program - Progress: Progressing toward goal  PT Treatment Precautions/Restrictions  Precautions Precautions: Knee Required Braces or Orthoses: Yes Knee Immobilizer: On except when in CPM;Other (comment) Restrictions Weight Bearing Restrictions: Yes LLE Weight Bearing: Weight bearing as tolerated Mobility (including Balance) Bed Mobility Bed Mobility: Yes Supine to Sit: 4: Min assist Supine to Sit Details (indicate cue type and reason): A for L LE Transfers Transfers: Yes Sit to Stand: 4: Min assist;From bed;With upper extremity assist Sit to Stand Details (indicate cue type and reason): cues for UE use and encouragement Stand to Sit: 4: Min assist;With upper extremity assist;To chair/3-in-1 Ambulation/Gait Ambulation/Gait: Yes Ambulation/Gait Assistance: 4: Min assist Ambulation/Gait Assistance Details (indicate cue type and reason): cues for upright posture.  Limited by pain today.   Ambulation Distance (Feet): 80 Feet Assistive device: Rolling walker Gait Pattern: Antalgic Gait velocity: decreased Stairs: No    Exercise    End of Session PT - End of Session Equipment Utilized During Treatment: Gait belt;Left knee immobilizer Activity Tolerance: Patient limited by pain Patient left: in chair;with call  bell in reach General Behavior During Session: [facility] for tasks performed Cognition: [facility] for tasks performed  [facility], PT [phone] 05/26/2011, 11:39 AM

## 2011-05-26 NOTE — Progress Notes (Signed)
ANTIBIOTIC CONSULT NOTE - INITIAL  Pharmacy Consult for vancomycin  Indication: chronic infection of back and knee  Allergies  Allergen Reactions  . Oysters (Shellfish Allergy) Other (See Comments)    Vomiting and passed    Patient Measurements: Height: 5\' 9"  (175.3 cm) Weight: 130 lb 1.1 oz (59 kg) IBW/kg (Calculated) : 70.7    Vital Signs: Temp: 97.6 F (36.4 C) (11/09 2247) Temp src: Oral (11/09 1500) BP: 110/62 mmHg (11/09 2247) Pulse Rate: 74  (11/09 2247) Intake/Output from previous day: 11/08 0701 - 11/09 0700 In: 2120 [P.O.:1420] Out: 651 [Urine:651] Intake/Output from this shift:    Labs:  Basename 05/26/11 0501 05/25/11 0630 05/24/11 0545  WBC 9.5 9.6 7.6  HGB 9.4* 7.3* 8.4*  PLT 174 156 180  LABCREA -- -- --  CREATININE -- -- 1.11   Estimated Creatinine Clearance: 42.1 ml/min (by C-G formula based on Cr of 1.11).   Microbiology: Recent Results (from the past 720 hour(s))  MRSA PCR SCREENING     Status: Normal   Collection Time   04/27/11  6:13 AM      Component Value Range Status Comment   MRSA by PCR NEGATIVE  NEGATIVE  Final   URINE CULTURE     Status: Normal   Collection Time   05/18/11  1:58 PM      Component Value Range Status Comment   Specimen Description URINE, CLEAN CATCH   Final    Special Requests NONE   Final    Setup Time [id number]   Final    Colony Count >=100,000 COLONIES/ML   Final    Culture PSEUDOMONAS AERUGINOSA   Final    Report Status 05/21/2011 FINAL   Final    Organism ID, Bacteria PSEUDOMONAS AERUGINOSA   Final   GRAM STAIN     Status: Normal   Collection Time   05/23/11  8:29 AM      Component Value Range Status Comment   Specimen Description FLUID SYNOVIAL LEFT KNEE   Final    Special Requests NONE   Final    Gram Stain     Final    Value: RARE WBC PRESENT,BOTH PMN AND MONONUCLEAR     NO ORGANISMS SEEN     CALLED TO Leanne Chang J RN 05/23/11 0930 COSTELLO B   Report Status 05/23/2011 FINAL   Final   BODY FLUID  CULTURE     Status: Normal   Collection Time   05/23/11  8:29 AM      Component Value Range Status Comment   Specimen Description FLUID SYNOVIAL LEFT KNEE   Final    Special Requests NONE   Final    Gram Stain     Final    Value: RARE WBC PRESENT,BOTH PMN AND MONONUCLEAR     NO ORGANISMS SEEN     CALLED TO Leanne Chang J RN 05/23/11 0930 COSTELLO B Performed at [facility]   Culture NO GROWTH 3 DAYS   Final    Report Status 05/26/2011 FINAL   Final   ANAEROBIC CULTURE     Status: Normal (Preliminary result)   Collection Time   05/23/11  8:29 AM      Component Value Range Status Comment   Specimen Description FLUID SYNOVIAL LEFT KNEE   Final    Special Requests NONE   Final    Gram Stain PENDING   Incomplete    Culture     Final    Value: NO ANAEROBES ISOLATED; CULTURE IN  PROGRESS FOR 5 DAYS   Report Status PENDING   Incomplete   ANAEROBIC CULTURE     Status: Normal (Preliminary result)   Collection Time   05/23/11  8:29 AM      Component Value Range Status Comment   Specimen Description FLUID SYNOVIAL LEFT KNEE   Final    Special Requests NONE   Final    Gram Stain     Final    Value: RARE WBC PRESENT,BOTH PMN AND MONONUCLEAR     NO ORGANISMS SEEN     CALLED TO Leanne Chang J RN 05/23/11 0930 COSTELLO B  Performed at [facility]   Culture     Final    Value: NO ANAEROBES ISOLATED; CULTURE IN PROGRESS FOR 5 DAYS   Report Status PENDING   Incomplete   GRAM STAIN     Status: Normal   Collection Time   05/23/11  8:30 AM      Component Value Range Status Comment   Specimen Description FLUID SYNOVIAL LEFT KNEE   Final    Special Requests NONE   Final    Gram Stain     Final    Value: RARE WBC PRESENT,BOTH PMN AND MONONUCLEAR     NO ORGANISMS SEEN     CALLED TO Leanne Chang J RN 05/23/11 0930 COSTELLO B   Report Status 05/23/2011 FINAL   Final   BODY FLUID CULTURE     Status: Normal   Collection Time   05/23/11  8:30 AM      Component Value Range Status Comment   Specimen Description  FLUID SYNOVIAL LEFT KNEE   Final    Special Requests NONE   Final    Gram Stain     Final    Value: NO WBC SEEN     NO ORGANISMS SEEN   Culture NO GROWTH 3 DAYS   Final    Report Status 05/26/2011 FINAL   Final     Medical History: Past Medical History  Diagnosis Date  . Subdural hematoma 03/30/11  . Epidural abscess   . Staphylococcus aureus bacteremia with sepsis   . Prosthetic joint infection   . Hyperlipidemia   . History of blood clots   . Blood dyscrasia     pt CAN NOT have any blood thinners d/t hx brain bleed  . S/P right heart catheterization     at least 3yrs  . Asthma   . COPD (chronic obstructive pulmonary disease)     emphysema  . Shortness of breath     with exertion  . Pneumonia     as a child and in 1952;had a pneumonia vaccine couple of years ago  . Peripheral neuropathy   . Short-term memory loss   . Arthritis     left leg also has swelling  . Gastric ulcer   . Constipation     miralax prn  . Depression     takes wellbutrin bid  . Urinary frequency     wears depends  . Complication of anesthesia     confusion with anesthesia  . Sleep apnea     has CPAP but doesn't use it;sleep study done at least 79yrs ago    Medications:  Scheduled:    . buPROPion  100 mg Oral BID  . calcium carbonate  500 mg of elemental calcium Oral Daily  . cloNIDine  150 mcg Intra-articular Once  . darifenacin  7.5 mg Oral Daily  . docusate sodium  100 mg  Oral BID  . doxazosin  4 mg Oral BID  . Fluticasone-Salmeterol  1 puff Inhalation Q12H  . montelukast  10 mg Oral QHS  . polyethylene glycol  17 g Oral Daily  . potassium chloride  20 mEq Oral Daily  . rOPINIRole  1 mg Oral QHS  . simvastatin  40 mg Oral QHS  . triamterene-hydrochlorothiazide  1 tablet Oral Daily   Assessment: 77yo male admitted for L TKA infection, now s/p revision, rec'd three doses of post-op vanc, now to resume due to chronic infection of back and knee (NGTD on synovial fluid).  Goal of  Therapy:  Vancomycin trough level 15-20 mcg/ml  Plan:  Will begin vancomycin 500mg  IV Q12H and monitor CBC, Cx, levels prn.  Colleen Can PharmD BCPS 05/26/2011,11:30 PM

## 2011-05-26 NOTE — Progress Notes (Signed)
Subjective: 3 Days Post-Op Procedure(s) (LRB): TOTAL KNEE REVISION (Left) Patient reports pain as 2 on 0-10 scale.   Pt mobilizing well walking with pt Objective: Vital signs in last 24 hours: Temp:  [97.3 F (36.3 C)-98.3 F (36.8 C)] 98.2 F (36.8 C) (11/09 0625) Pulse Rate:  [78-97] 78  (11/09 0625) Resp:  [16] 16  (11/09 0625) BP: (92-125)/(49-67) 106/59 mmHg (11/09 0625) SpO2:  [91 %-97 %] 92 % (11/09 0738)  Intake/Output from previous day: 11/08 0701 - 11/09 0700 In: 2120 [P.O.:1420] Out: 651 [Urine:651] Intake/Output this shift:     Basename 05/26/11 0501 05/25/11 0630 05/24/11 0545 05/23/11 1355 05/23/11 1027  HGB 9.4* 7.3* 8.4* 9.3* 8.2*    Basename 05/26/11 0501 05/25/11 0630  WBC 9.5 9.6  RBC 3.09* 2.36*  HCT 27.9* 22.2*  PLT 174 156    Basename 05/24/11 0545 05/23/11 1027  NA 137 138  K 3.8 3.8  CL 103 --  CO2 30 --  BUN 18 --  CREATININE 1.11 --  GLUCOSE 86 119*  CALCIUM 8.4 --   No results found for this basename: LABPT:2,INR:2 in the last 72 hours  Neurovascular intact Sensation intact distally Intact pulses distally  Assessment/Plan: 3 Days Post-Op Procedure(s) (LRB): TOTAL KNEE REVISION (Left) Up with therapy Dc home vs snf hgb up to 9.4 Argie Applegate SCOTT 05/26/2011, 8:24 AM

## 2011-05-26 NOTE — Progress Notes (Signed)
Occupational Therapy Treatment Patient Details Name: Micheal Frank MRN: [id number] DOB: 1925-07-22 Today's Date: 05/26/2011  OT Assessment/Plan OT Assessment/Plan OT Plan: Discharge plan remains appropriate OT Frequency: Min 2X/week Follow Up Recommendations: Home health OT Equipment Recommended: None recommended by PT OT Goals Acute Rehab OT Goals OT Goal Formulation: With patient Time For Goal Achievement: 7 days ADL Goals ADL Goal: Grooming - Progress: Progressing toward goals ADL Goal: Toilet Transfer - Progress: Progressing toward goals  OT Treatment Precautions/Restrictions  Precautions Precautions: Knee Required Braces or Orthoses: Yes Knee Immobilizer: On except when in CPM;Other (comment) Restrictions Weight Bearing Restrictions: Yes LLE Weight Bearing: Weight bearing as tolerated   ADL ADL Grooming: Performed;Teeth care;Denture care;Minimal assistance Grooming Details (indicate cue type and reason): mod. encouragement secondary to fear of falling Where Assessed - Grooming: Standing at sink Toilet Transfer: Performed;Moderate assistance Toilet Transfer Details (indicate cue type and reason): inst. cues for sequencing and safety with LLE Toilet Transfer Method: Ambulating;Stand pivot Toilet Transfer Equipment: Raised toilet seat with arms (or 3-in-1 over toilet) Toileting - Clothing Manipulation: Performed;Minimal assistance Where Assessed - Toileting Clothing Manipulation: Standing Equipment Used: Rolling walker Mobility  Bed Mobility Bed Mobility: Yes Supine to Sit: 4: Min assist Supine to Sit Details (indicate cue type and reason): A for L LE Transfers Transfers: Yes Sit to Stand: 4: Min assist;With armrests;From chair/3-in-1 Sit to Stand Details (indicate cue type and reason): cues for UE use and encouragement Stand to Sit: 4: Min assist;With armrests;To chair/3-in-1 Exercises    End of Session OT - End of Session Equipment Utilized During  Treatment: Left knee immobilizer Activity Tolerance: Patient tolerated treatment well Patient left: in chair;with call bell in reach General Behavior During Session: [facility] for tasks performed Cognition: [facility] for tasks performed  Robet Leu COTA/L [phone] 05/26/2011, 1:01 PM

## 2011-05-26 NOTE — Plan of Care (Signed)
Problem: Discharge Progression Outcomes Goal: Demonstrates ADLs as appropriate Pt. Progressing with ADL goals

## 2011-05-27 NOTE — Progress Notes (Signed)
Physical Therapy Treatment Patient Details Name: Micheal Frank MRN: [id number] DOB: 11-Nov-1925 Today's Date: 05/27/2011  PT Assessment/Plan  PT - Assessment/Plan PT Plan: Discharge plan remains appropriate PT Frequency: 7X/week Follow Up Recommendations: Home health PT PT Goals     PT Treatment Precautions/Restrictions  Precautions Precautions: Knee Required Braces or Orthoses: Yes Knee Immobilizer: On except when in CPM Restrictions Weight Bearing Restrictions: Yes LLE Weight Bearing: Weight bearing as tolerated Mobility (including Balance) Bed Mobility Bed Mobility: Yes Supine to Sit: 4: Min assist Supine to Sit Details (indicate cue type and reason): A to hold LLE. Cues for technique Transfers Transfers: Yes Sit to Stand: 4: Min assist;From bed Sit to Stand Details (indicate cue type and reason): Cues for technique and hand placement Stand to Sit: 4: Min assist;To chair/3-in-1 Stand to Sit Details: Cues for safe technique Ambulation/Gait Ambulation/Gait: Yes Ambulation/Gait Assistance: 4: Min assist Ambulation/Gait Assistance Details (indicate cue type and reason): Cues for posture and RW safety Ambulation Distance (Feet): 140 Feet Assistive device: Rolling walker Gait Pattern: Step-through pattern;Trunk flexed Gait velocity: decreased Stairs: No Wheelchair Mobility Wheelchair Mobility: No    Exercise  Total Joint Exercises Quad Sets: AROM;Strengthening;Left;15 reps;Seated Short Arc Quad: Seated;Other (comment);Strengthening;AAROM;Left (15) Heel Slides: AAROM;Strengthening;Left;15 reps;Seated Straight Leg Raises: AAROM;Strengthening;Left;15 reps;Seated End of Session PT - End of Session Equipment Utilized During Treatment: Gait belt;Left knee immobilizer Activity Tolerance: Patient tolerated treatment well Patient left: in chair;with call bell in reach General Behavior During Session: [facility] for tasks performed Cognition: [facility] for tasks  performed  Fredrich Birks 05/27/2011, 12:23 PM 05/27/2011 Fredrich Birks PTA [phone] pager [phone] office

## 2011-05-27 NOTE — Progress Notes (Signed)
Subjective: Pt feels good - ambulating in hall   Objective: Vital signs in last 24 hours: Temp:  [97.6 F (36.4 C)-98.6 F (37 C)] 98.6 F (37 C) (11/10 0700) Pulse Rate:  [74-89] 74  (11/10 0700) Resp:  [18-20] 20  (11/10 0700) BP: (100-114)/(54-62) 114/61 mmHg (11/10 0700) SpO2:  [94 %-99 %] 99 % (11/10 0700)  Intake/Output from previous day: 11/09 0701 - 11/10 0700 In: 340 [P.O.:240; IV Piggyback:100] Out: 500 [Urine:500] Intake/Output this shift:    Exam:  Neurologically intact Neurovascular intact Sensation intact distally Intact pulses distally No cellulitis present rom on cpm progressing  Labs:  Basename 05/26/11 0501 05/25/11 0630  HGB 9.4* 7.3*    Basename 05/26/11 0501 05/25/11 0630  WBC 9.5 9.6  RBC 3.09* 2.36*  HCT 27.9* 22.2*  PLT 174 156   No results found for this basename: NA:2,K:2,CL:2,CO2:2,BUN:2,CREATININE:2,GLUCOSE:2,CALCIUM:2 in the last 72 hours No results found for this basename: LABPT:2,INR:2 in the last 72 hours  Assessment/Plan: Progressing well iv vanc for 2 more days then dc abx - home vs snf monday   Micheal Frank 05/27/2011, 8:34 AM

## 2011-05-28 LAB — ANAEROBIC CULTURE

## 2011-05-28 NOTE — Progress Notes (Signed)
Physical Therapy Treatment Patient Details Name: Micheal Frank MRN: [id number] DOB: 1926/04/27 Today's Date: 05/28/2011  PT Assessment/Plan  PT - Assessment/Plan Comments on Treatment Session: tolerated session well. limited gait distance today due to pt wanting to eat when tray arrived before food got cold. pt agreeable to ambulating later with nursing.  PT Plan: Discharge plan remains appropriate PT Frequency: 7X/week Follow Up Recommendations: Home health PT Equipment Recommended: None recommended by PT PT Goals  Acute Rehab PT Goals PT Goal: Supine/Side to Sit - Progress: Progressing toward goal PT Transfer Goal: Sit to Stand/Stand to Sit - Progress: Progressing toward goal PT Goal: Ambulate - Progress: Progressing toward goal PT Goal: Perform Home Exercise Program - Progress: Progressing toward goal  PT Treatment Precautions/Restrictions  Precautions Precautions: Knee Required Braces or Orthoses: Yes Knee Immobilizer: On except when in CPM Restrictions Weight Bearing Restrictions: Yes LLE Weight Bearing: Weight bearing as tolerated Mobility (including Balance) Bed Mobility Supine to Sit: 4: Min assist;HOB flat Supine to Sit Details (indicate cue type and reason): assist needed to adavance left lower ext. off edge of bed. cues for use of upper ext. for trunk elevation and for movement of hips to edge of bed. Transfers Sit to Stand: Other (comment);From bed;With upper extremity assist (min guard assist) Stand to Sit: To chair/3-in-1;With upper extremity assist;With armrests (min guard assist) Ambulation/Gait Ambulation/Gait Assistance: 4: Min assist Ambulation/Gait Assistance Details (indicate cue type and reason): verbal cues for sequency and to stay with RW.  verbal/tactile cues for upright posture. Ambulation Distance (Feet): 10 Feet (limited due to lunch tray arrived and pt ready to eat) Assistive device: Rolling walker Gait Pattern: Step-to pattern;Decreased stance  time - left;Decreased step length - right;Trunk flexed Gait velocity: decreased gait velocity Stairs: No Wheelchair Mobility Wheelchair Mobility: No  Posture/Postural Control Posture/Postural Control: No significant limitations Balance Balance Assessed: No Exercise  Total Joint Exercises Ankle Circles/Pumps: AROM;Both;10 reps;Supine Quad Sets: AROM;Left;10 reps;Supine;Strengthening (5 second hold) Short Arc Quad: AAROM;Left;10 reps;Supine;Strengthening Heel Slides: AAROM;Left;Supine;Strengthening Straight Leg Raises: AAROM;Strengthening;Left;10 reps;Supine End of Session PT - End of Session Equipment Utilized During Treatment: Gait belt;Left knee immobilizer Activity Tolerance: Patient tolerated treatment well Patient left: in chair;with call bell in reach Nurse Communication: Mobility status for transfers;Mobility status for ambulation;Weight bearing status General Behavior During Session: [facility] for tasks performed Cognition: [facility] for tasks performed  Sallyanne Kuster, PTA Office- [phone] 05/28/2011, 12:16 PM

## 2011-05-28 NOTE — Progress Notes (Signed)
Subjective: Pt doing well on cpm and walking in hall   Objective: Vital signs in last 24 hours: Temp:  [98 F (36.7 C)-98.1 F (36.7 C)] 98.1 F (36.7 C) (11/11 1350) Pulse Rate:  [74-82] 78  (11/11 1350) Resp:  [18-19] 19  (11/11 1350) BP: (111-138)/(59-70) 111/59 mmHg (11/11 1350) SpO2:  [94 %-98 %] 98 % (11/11 1350)  Intake/Output from previous day: 11/10 0701 - 11/11 0700 In: 550 [P.O.:350; IV Piggyback:200] Out: 2001 [Urine:2000; Stool:1] Intake/Output this shift: Total I/O In: 480 [P.O.:480] Out: 500 [Urine:500]  Exam:  Neurovascular intact Sensation intact distally Intact pulses distally Dorsiflexion/Plantar flexion intact Incision: no drainage  Labs:  Basename 05/26/11 0501  HGB 9.4*    Basename 05/26/11 0501  WBC 9.5  RBC 3.09*  HCT 27.9*  PLT 174   No results found for this basename: NA:2,K:2,CL:2,CO2:2,BUN:2,CREATININE:2,GLUCOSE:2,CALCIUM:2 in the last 72 hours No results found for this basename: LABPT:2,INR:2 in the last 72 hours  Assessment/Plan: Pt doing well - need assessment from pt as to safety to d/c home now off ivabx - poss dc am   [name],[name] 05/28/2011, 5:31 PM

## 2011-05-29 ENCOUNTER — Encounter (HOSPITAL_COMMUNITY): Payer: Self-pay | Admitting: Orthopedic Surgery

## 2011-05-29 ENCOUNTER — Ambulatory Visit: Payer: Medicare Other | Admitting: Infectious Disease

## 2011-05-29 MED ORDER — HYDROCODONE-ACETAMINOPHEN 5-325 MG PO TABS: 1.0000 | ORAL_TABLET | Freq: Four times a day (QID) | ORAL | Status: DC | PRN

## 2011-05-29 NOTE — Progress Notes (Signed)
Utilization review completed. Anette Guarneri, RN, BSN. 05/29/11

## 2011-05-29 NOTE — Discharge Summary (Signed)
Physician Discharge Summary  Patient ID: Micheal Frank MRN: [id number] DOB/AGE: 1926/01/05 77 y.o.  Admit date: 05/23/2011 Discharge date: 05/29/2011  Admission Diagnoses:  tka infection  Discharge Diagnoses:  Same  Surgeries: Procedure(s): TOTAL KNEE REVISION on 05/23/2011   Consultants:    Discharged Condition: Stable  Hospital Course: Micheal Frank is an 77 y.o. male who was admitted 05/23/2011 with a chief complaint of tka infxn s/p implant removal and abx rx., and found to have a diagnosis of resolved tka infxn..  They were brought to the operating room on 05/23/2011 and underwent the above named procedures.    Antibiotics given:  Anti-infectives     Start     Dose/Rate Route Frequency Ordered Stop   05/27/11 0000   vancomycin (VANCOCIN) 500 mg in sodium chloride 0.9 % 100 mL IVPB        500 mg 100 mL/hr over 60 Minutes Intravenous Every 12 hours 05/26/11 2342     05/24/11 1200   vancomycin (VANCOCIN) 500 mg in sodium chloride 0.9 % 100 mL IVPB        500 mg 100 mL/hr over 60 Minutes Intravenous Every 12 hours 05/24/11 1124 05/25/11 1451   05/23/11 1030   cefUROXime (ZINACEF) injection  Status:  Discontinued          As needed 05/23/11 1155 05/23/11 1234   05/23/11 0715   ciprofloxacin (CIPRO) IVPB 400 mg  Status:  Discontinued        400 mg 200 mL/hr over 60 Minutes Intravenous Every 12 hours 05/23/11 0701 05/23/11 1556   05/22/11 1500   vancomycin (VANCOCIN) IVPB 1000 mg/200 mL premix  Status:  Discontinued        1,000 mg 200 mL/hr over 60 Minutes Intravenous 60 min pre-op 05/22/11 1454 05/23/11 1556        .  Recent vital signs:  Filed Vitals:   05/29/11 0611  BP: 105/61  Pulse: 78  Temp: 98.1 F (36.7 C)  Resp: 18    Recent laboratory studies:  Results for orders placed during the hospital encounter of 05/23/11  GRAM STAIN      Component Value Range   Specimen Description FLUID SYNOVIAL LEFT KNEE     Special Requests NONE     Gram Stain        Value: RARE WBC PRESENT,BOTH PMN AND MONONUCLEAR     NO ORGANISMS SEEN     CALLED TO Leanne Chang J RN 05/23/11 0930 COSTELLO B   Report Status 05/23/2011 FINAL    BODY FLUID CULTURE      Component Value Range   Specimen Description FLUID SYNOVIAL LEFT KNEE     Special Requests NONE     Gram Stain       Value: RARE WBC PRESENT,BOTH PMN AND MONONUCLEAR     NO ORGANISMS SEEN     CALLED TO Francene Finders RN 05/23/11 0930 COSTELLO B Performed at [facility]   Culture NO GROWTH 3 DAYS     Report Status 05/26/2011 FINAL    GRAM STAIN      Component Value Range   Specimen Description FLUID SYNOVIAL LEFT KNEE     Special Requests NONE     Gram Stain       Value: RARE WBC PRESENT,BOTH PMN AND MONONUCLEAR     NO ORGANISMS SEEN     CALLED TO Francene Finders RN 05/23/11 0930 COSTELLO B   Report Status 05/23/2011 FINAL    BODY FLUID CULTURE  Component Value Range   Specimen Description FLUID SYNOVIAL LEFT KNEE     Special Requests NONE     Gram Stain       Value: NO WBC SEEN     NO ORGANISMS SEEN   Culture NO GROWTH 3 DAYS     Report Status 05/26/2011 FINAL    ANAEROBIC CULTURE      Component Value Range   Specimen Description FLUID SYNOVIAL LEFT KNEE     Special Requests NONE     Gram Stain       Value: NO WBC SEEN     NO ORGANISMS SEEN   Culture NO ANAEROBES ISOLATED     Report Status 05/28/2011 FINAL    ANAEROBIC CULTURE      Component Value Range   Specimen Description FLUID SYNOVIAL LEFT KNEE     Special Requests NONE     Gram Stain       Value: RARE WBC PRESENT,BOTH PMN AND MONONUCLEAR     NO ORGANISMS SEEN     CALLED TO Leanne Chang J RN 05/23/11 0930 COSTELLO B  Performed at [facility]   Culture NO ANAEROBES ISOLATED     Report Status 05/28/2011 FINAL    TYPE AND SCREEN      Component Value Range   ABO/RH(D) A POS     Antibody Screen NEG     Sample Expiration 05/26/2011     Unit Number [id number]     Blood Component Type RED CELLS,LR     Unit division 00     Status of  Unit ISSUED,FINAL     Transfusion Status OK TO TRANSFUSE     Crossmatch Result Compatible     Unit Number [id number]     Blood Component Type RED CELLS,LR     Unit division 00     Status of Unit ISSUED,FINAL     Transfusion Status OK TO TRANSFUSE     Crossmatch Result Compatible     Unit Number [id number]     Blood Component Type RED CELLS,LR     Unit division 00     Status of Unit ISSUED,FINAL     Transfusion Status OK TO TRANSFUSE     Crossmatch Result Compatible     Unit Number [id number]     Blood Component Type RED CELLS,LR     Unit division 00     Status of Unit ISSUED,FINAL     Transfusion Status OK TO TRANSFUSE     Crossmatch Result Compatible    CBC      Component Value Range   WBC 11.3 (*) 4.0 - 10.5 (K/uL)   RBC 3.03 (*) 4.22 - 5.81 (MIL/uL)   Hemoglobin 9.3 (*) 13.0 - 17.0 (g/dL)   HCT 38.7 (*) 56.4 - 52.0 (%)   MCV 92.7  78.0 - 100.0 (fL)   MCH 30.7  26.0 - 34.0 (pg)   MCHC 33.1  30.0 - 36.0 (g/dL)   RDW 33.2  95.1 - 88.4 (%)   Platelets 182  150 - 400 (K/uL)  CBC      Component Value Range   WBC 7.6  4.0 - 10.5 (K/uL)   RBC 2.72 (*) 4.22 - 5.81 (MIL/uL)   Hemoglobin 8.4 (*) 13.0 - 17.0 (g/dL)   HCT 16.6 (*) 06.3 - 52.0 (%)   MCV 93.4  78.0 - 100.0 (fL)   MCH 30.9  26.0 - 34.0 (pg)   MCHC 33.1  30.0 - 36.0 (g/dL)   RDW 01.6 (*)  11.5 - 15.5 (%)   Platelets 180  150 - 400 (K/uL)  BASIC METABOLIC PANEL      Component Value Range   Sodium 137  135 - 145 (mEq/L)   Potassium 3.8  3.5 - 5.1 (mEq/L)   Chloride 103  96 - 112 (mEq/L)   CO2 30  19 - 32 (mEq/L)   Glucose, Bld 86  70 - 99 (mg/dL)   BUN 18  6 - 23 (mg/dL)   Creatinine, Ser 1.61  0.50 - 1.35 (mg/dL)   Calcium 8.4  8.4 - 09.6 (mg/dL)   GFR calc non Af Amer 59 (*) >90 (mL/min)   GFR calc Af Amer 69 (*) >90 (mL/min)  POCT I-STAT 4, (NA,K, GLUC, HGB,HCT)      Component Value Range   Sodium 138  135 - 145 (mEq/L)   Potassium 3.8  3.5 - 5.1 (mEq/L)   Glucose, Bld 119 (*) 70 - 99 (mg/dL)   HCT 04.5 (*)  40.9 - 52.0 (%)   Hemoglobin 8.2 (*) 13.0 - 17.0 (g/dL)  CBC      Component Value Range   WBC 9.6  4.0 - 10.5 (K/uL)   RBC 2.36 (*) 4.22 - 5.81 (MIL/uL)   Hemoglobin 7.3 (*) 13.0 - 17.0 (g/dL)   HCT 81.1 (*) 91.4 - 52.0 (%)   MCV 94.1  78.0 - 100.0 (fL)   MCH 30.9  26.0 - 34.0 (pg)   MCHC 32.9  30.0 - 36.0 (g/dL)   RDW 78.2  95.6 - 21.3 (%)   Platelets 156  150 - 400 (K/uL)  PREPARE RBC (CROSSMATCH)      Component Value Range   Order Confirmation ORDER PROCESSED BY BLOOD BANK    CBC      Component Value Range   WBC 9.5  4.0 - 10.5 (K/uL)   RBC 3.09 (*) 4.22 - 5.81 (MIL/uL)   Hemoglobin 9.4 (*) 13.0 - 17.0 (g/dL)   HCT 08.6 (*) 57.8 - 52.0 (%)   MCV 90.3  78.0 - 100.0 (fL)   MCH 30.4  26.0 - 34.0 (pg)   MCHC 33.7  30.0 - 36.0 (g/dL)   RDW 46.9 (*) 62.9 - 15.5 (%)   Platelets 174  150 - 400 (K/uL)  PREPARE RBC (CROSSMATCH)      Component Value Range   Order Confirmation ORDER PROCESSED BY BLOOD BANK      Discharge Medications:   Current Discharge Medication List    CONTINUE these medications which have CHANGED   Details  HYDROcodone-acetaminophen (NORCO) 5-325 MG per tablet Take 1 tablet by mouth every 6 (six) hours as needed. For pain Qty: 30 tablet, Refills: 0      CONTINUE these medications which have NOT CHANGED   Details  buPROPion (WELLBUTRIN SR) 100 MG 12 hr tablet Take 100 mg by mouth 2 (two) times daily.     calcium carbonate (OS-CAL) 600 MG TABS Take 600 mg by mouth daily.     diazepam (VALIUM) 5 MG tablet Take 5 mg by mouth every 8 (eight) hours as needed. For anxiety    doxazosin (CARDURA) 4 MG tablet Take 4 mg by mouth 2 (two) times daily.      Fluticasone-Salmeterol (ADVAIR DISKUS) 250-50 MCG/DOSE AEPB Inhale 1 puff into the lungs every 12 (twelve) hours.      potassium chloride (KLOR-CON) 20 MEQ packet Take 20 mEq by mouth daily.      rOPINIRole (REQUIP) 1 MG tablet Take 1 mg by mouth at  bedtime.     simvastatin (ZOCOR) 40 MG tablet Take 40 mg by  mouth at bedtime.     solifenacin (VESICARE) 5 MG tablet Take 5 mg by mouth daily.      triamterene-hydrochlorothiazide (MAXZIDE) 75-50 MG per tablet Take 1 tablet by mouth daily.      zolpidem (AMBIEN) 5 MG tablet Take 5 mg by mouth at bedtime as needed. For sleep    albuterol (PROVENTIL HFA;VENTOLIN HFA) 108 (90 BASE) MCG/ACT inhaler Inhale 1-2 puffs into the lungs every 6 (six) hours as needed. For shortness of breath    Flaxseed, Linseed, ([employer] FLAXSEED OIL) 1000 MG CAPS Take 1,000 mg by mouth daily.      meclizine (ANTIVERT) 25 MG tablet Take 25 mg by mouth 4 (four) times daily as needed. For vertigo    montelukast (SINGULAIR) 10 MG tablet Take 10 mg by mouth at bedtime.      polyethylene glycol (MIRALAX / GLYCOLAX) packet Take 17 g by mouth daily.        STOP taking these medications     cephALEXin (KEFLEX) 500 MG capsule      dextrose 5 % SOLN 50 mL with ceFAZolin 1 G SOLR 1,000 mg         Diagnostic Studies: Ct Head Wo Contrast  05/12/2011  *RADIOLOGY REPORT*  Clinical Data: Altered mental status.  CT HEAD WITHOUT CONTRAST  Technique:  Contiguous axial images were obtained from the base of the skull through the vertex without contrast.  Comparison: Head CT 04/17/2011  Findings: The right frontal subdural collection may have slightly decreased in size.   The density of the subdural collection has not significantly changed.  The amount of right-to-left midline shift measures 3 mm and unchanged.  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  Stable appearance of the ventricles and basal cisterns.  Evidence of previous right mastoid surgery.  No acute bony abnormality.  IMPRESSION: There has been minimal change in the appearance of the right subdural collection.  No acute intracranial abnormalities.  Original Report Authenticated By: Richarda Overlie, M.D.   Mr Foot Left W Wo Contrast  05/16/2011  *RADIOLOGY REPORT*  Clinical Data: Left foot pain and swelling.   Question infection. Status post removal of the nail of the great toe.  MRI OF THE LEFT FOREFOOT WITHOUT AND WITH CONTRAST  Technique:  Multiplanar, multisequence MR imaging was performed both before and after administration of intravenous contrast.  Contrast: 7mL MULTIHANCE GADOBENATE DIMEGLUMINE 529 MG/ML IV SOLN  Comparison: MRI left foot 04/24/2011.  Findings: As on the prior study, there is diffuse subcutaneous edema about the foot without marked interval change.  Soft tissue edema in the intrinsic plantar musculature of the midfoot is again identified.  No rim enhancing fluid collection is identified. Muscles and tendons appear intact.  Soft tissue structures demonstrate only mild enhancement.  Intrinsic musculature of the foot and visualized tendons are intact. As on the prior study, mild marrow edema and enhancement are seen in the heads of the first and second metatarsals.  No fracture is seen.  IMPRESSION: No interval change in appearance of the foot.  Extensive soft tissue edema and mild enhancement is compatible with cellulitis with changes likely due to myositis in the plantar musculature again noted.  Mild marrow edema in the heads of the first and second metatarsals is unchanged and could be related to degenerative disease or less likely osteomyelitis.  Original Report Authenticated By: Maisie Fus  L. D'ALESSIO, M.D.   Dg Knee Left Port  05/23/2011  *RADIOLOGY REPORT*  Clinical Data: 77 year old male status post left knee surgery.  PORTABLE LEFT KNEE - 1-2 VIEW  Comparison: 03/21/2011.  Findings: Revision of left total knee arthroplasty.  New hardware components with distal femoral and proximal tibial stems are in place and appear intact.  Alignment appears within normal limits. Small cortical screw traverses the lateral femoral condyle. Postoperative drain in place.  Overlying skin staples.  Extensive vascular calcifications in the left lower extremity.  IMPRESSION: Left knee arthroplasty hardware  revision.  No adverse features identified.  Original Report Authenticated By: Harley Hallmark, M.D.    Disposition: Home or Self Care  Discharge Orders    Future Orders Please Complete By Expires   Diet - low sodium heart healthy      Call MD / Call 911      Comments:   If you experience chest pain or shortness of breath, CALL 911 and be transported to the hospital emergency room.  If you develope a fever above 101 F, pus (white drainage) or increased drainage or redness at the wound, or calf pain, call your surgeon's office.   Constipation Prevention      Comments:   Drink plenty of fluids.  Prune juice may be helpful.  You may use a stool softener, such as Colace (over the counter) 100 mg twice a day.  Use MiraLax (over the counter) for constipation as needed.   Increase activity slowly as tolerated      Weight Bearing as taught in Physical Therapy      Comments:   Use a walker or crutches as instructed.   Discharge instructions      Comments:   Weight bearing as tolerated left leg - use knee immobilizer when walking on left leg - ok to be out of immobilizer when resting - ok to shower but keep incision dry   Consult to care management      Comments:   hh pt for patient thx         Signed: Ryatt Corsino SCOTT 05/29/2011, 12:09 PM

## 2011-05-29 NOTE — Progress Notes (Signed)
Subjective: Pt stable walking in hall without difficulty in brace   Objective: Vital signs in last 24 hours: Temp:  [98.1 F (36.7 C)-98.2 F (36.8 C)] 98.1 F (36.7 C) (11/12 0611) Pulse Rate:  [75-78] 78  (11/12 0611) Resp:  [18-19] 18  (11/12 0611) BP: (105-121)/(59-61) 105/61 mmHg (11/12 0611) SpO2:  [95 %-98 %] 95 % (11/12 0611)  Intake/Output from previous day: 11/11 0701 - 11/12 0700 In: 820 [P.O.:720; IV Piggyback:100] Out: 1702 [Urine:1700; Stool:2] Intake/Output this shift: Total I/O In: 100 [P.O.:100] Out: -   Exam:  Sensation intact distally Intact pulses distally Dorsiflexion/Plantar flexion intact  Labs: No results found for this basename: HGB:5 in the last 72 hours No results found for this basename: WBC:2,RBC:2,HCT:2,PLT:2 in the last 72 hours No results found for this basename: NA:2,K:2,CL:2,CO2:2,BUN:2,CREATININE:2,GLUCOSE:2,CALCIUM:2 in the last 72 hours No results found for this basename: LABPT:2,INR:2 in the last 72 hours  Assessment/Plan:pt doing well dc home today   Micheal Frank 05/29/2011, 11:54 AM

## 2011-05-29 NOTE — Progress Notes (Signed)
Physical Therapy Treatment Patient Details Name: Micheal Frank MRN: [id number] DOB: 09-Mar-1926 Today's Date: 05/29/2011  PT Assessment/Plan  PT - Assessment/Plan Comments on Treatment Session: Pt continuing to progress with therapy. Pt stated that he has more than enough help at home and that his house is handicapped accesible due to grandson being disabled.  PT Plan: Discharge plan remains appropriate PT Frequency: 7X/week Follow Up Recommendations: Home health PT Equipment Recommended: None recommended by PT PT Goals  Acute Rehab PT Goals PT Goal: Supine/Side to Sit - Progress: Progressing toward goal PT Transfer Goal: Sit to Stand/Stand to Sit - Progress: Progressing toward goal PT Goal: Ambulate - Progress: Progressing toward goal PT Goal: Perform Home Exercise Program - Progress: Progressing toward goal  PT Treatment Precautions/Restrictions  Precautions Precautions: Knee Required Braces or Orthoses: Yes Knee Immobilizer: On except when in CPM Restrictions Weight Bearing Restrictions: Yes LLE Weight Bearing: Weight bearing as tolerated Mobility (including Balance) Bed Mobility Supine to Sit: 5: Supervision;With rails Supine to Sit Details (indicate cue type and reason): Cues for positioning.  Transfers Transfers: Yes Sit to Stand: Other (comment);From bed (MinGuard A) Sit to Stand Details (indicate cue type and reason): Cues for safe hand placement Stand to Sit: 5: Supervision;To chair/3-in-1 Ambulation/Gait Ambulation/Gait: Yes Ambulation/Gait Assistance: Other (comment) (MinGuard A) Ambulation/Gait Assistance Details (indicate cue type and reason): Cues for posture and to stay closer to RW Ambulation Distance (Feet): 190 Feet Assistive device: Rolling walker Gait Pattern: Step-to pattern;Trunk flexed Stairs: No Wheelchair Mobility Wheelchair Mobility: No    Exercise  Total Joint Exercises Heel Slides: AROM;Strengthening;Left;15 reps;Seated Hip  ABduction/ADduction: Seated;15 reps;Left;Strengthening;AROM Straight Leg Raises: AROM;Strengthening;Left;15 reps;Seated;Other (comment) (About a 20 degree extension lag) Long Arc Quad: AAROM;Strengthening;Left;10 reps;Seated End of Session PT - End of Session Equipment Utilized During Treatment: Gait belt;Left knee immobilizer Activity Tolerance: Patient tolerated treatment well Patient left: in chair;with call bell in reach General Behavior During Session: [facility] for tasks performed Cognition: [facility] for tasks performed  Janis Sol, Adline Potter 05/29/2011, 1:00 PM 05/29/2011 Fredrich Birks PTA [phone] pager [phone] office

## 2011-06-07 ENCOUNTER — Other Ambulatory Visit: Payer: Self-pay | Admitting: Critical Care Medicine

## 2011-06-20 ENCOUNTER — Other Ambulatory Visit (HOSPITAL_COMMUNITY): Payer: Self-pay | Admitting: Neurosurgery

## 2011-06-20 DIAGNOSIS — T50995A Adverse effect of other drugs, medicaments and biological substances, initial encounter: Secondary | ICD-10-CM

## 2011-06-20 DIAGNOSIS — I609 Nontraumatic subarachnoid hemorrhage, unspecified: Secondary | ICD-10-CM

## 2011-06-20 DIAGNOSIS — R4182 Altered mental status, unspecified: Secondary | ICD-10-CM

## 2011-07-05 ENCOUNTER — Ambulatory Visit (HOSPITAL_COMMUNITY)
Admission: RE | Admit: 2011-07-05 | Discharge: 2011-07-05 | Disposition: A | Discharge status: Home / Self Care | Payer: Medicare Other | Source: Ambulatory Visit | Attending: Neurosurgery | Admitting: Neurosurgery

## 2011-07-05 DIAGNOSIS — R4182 Altered mental status, unspecified: Secondary | ICD-10-CM | POA: Insufficient documentation

## 2011-07-05 DIAGNOSIS — I6200 Nontraumatic subdural hemorrhage, unspecified: Secondary | ICD-10-CM | POA: Insufficient documentation

## 2011-07-05 DIAGNOSIS — Z7901 Long term (current) use of anticoagulants: Secondary | ICD-10-CM | POA: Insufficient documentation

## 2011-07-05 DIAGNOSIS — I609 Nontraumatic subarachnoid hemorrhage, unspecified: Secondary | ICD-10-CM

## 2011-07-05 DIAGNOSIS — T50995A Adverse effect of other drugs, medicaments and biological substances, initial encounter: Secondary | ICD-10-CM

## 2011-07-24 ENCOUNTER — Encounter: Payer: Self-pay | Admitting: Critical Care Medicine

## 2011-07-24 ENCOUNTER — Other Ambulatory Visit: Payer: Self-pay | Admitting: Critical Care Medicine

## 2011-07-24 ENCOUNTER — Ambulatory Visit (INDEPENDENT_AMBULATORY_CARE_PROVIDER_SITE_OTHER): Payer: Medicare Other | Admitting: Critical Care Medicine

## 2011-07-24 VITALS — BP 120/60 | HR 80 | Temp 98.4°F | Ht 69.0 in | Wt 142.0 lb

## 2011-07-24 DIAGNOSIS — J45909 Unspecified asthma, uncomplicated: Secondary | ICD-10-CM

## 2011-07-24 DIAGNOSIS — E876 Hypokalemia: Secondary | ICD-10-CM

## 2011-07-24 MED ORDER — MONTELUKAST SODIUM 10 MG PO TABS: 10.0000 mg | ORAL_TABLET | Freq: Every day | ORAL | Status: DC

## 2011-07-24 MED ORDER — FLUTICASONE-SALMETEROL 250-50 MCG/DOSE IN AEPB: 1.0000 | INHALATION_SPRAY | Freq: Two times a day (BID) | RESPIRATORY_TRACT | Status: DC

## 2011-07-24 NOTE — Progress Notes (Signed)
Subjective:    Patient ID: Micheal Frank, male    DOB: 1926/05/25, 78 y.o.   MRN: [id number]  HPI Pulmonary OV  This is an 78 y.o. male with Golds III COPD primary emphysema DLCO 69% FeV1 94% 10/09  This pt has been with [facility] MD in the past. Dx COPD for 20 yrs. Last pfts 11/08 at [facility]:  DLCO 58%. also OSA on CPAP    07/24/2011 Prolonged and multiple hospitalizations in 2012.  Infected L TKR 5/12 with Staph aureus. Removed Knee in 8/12,  Complications of epidural abscess, SDH spontaneous from coumadin (6/12), Infected L TKR explanted and finally new TKR 11/12.  Lungs ok through this. Here for f/u for first time since 12/11.  Over past month dyspnea is ok.  Now at home,  No real mucus.  No chest pain.  No use of rescue inhaler.  DId not have to drain the hematoma in the brain.  Did drain the abscess in the back.   Review of SystemsConstitutional:   No  weight loss, night sweats,  Fevers, chills, fatigue, lassitude. HEENT:   No headaches,  Difficulty swallowing,  Tooth/dental problems,  Sore throat,                No sneezing, itching, ear ache, nasal congestion, post nasal drip,   CV:  No chest pain,  Orthopnea, PND, swelling in lower extremities, anasarca, dizziness, palpitations  GI  No heartburn, indigestion, abdominal pain, nausea, vomiting, diarrhea, change in bowel habits, loss of appetite  Resp: No shortness of breath with exertion or at rest.  No excess mucus, no productive cough,  No non-productive cough,  No coughing up of blood.  No change in color of mucus.  No wheezing.  No chest wall deformity  Skin: no rash or lesions.  GU: no dysuria, change in color of urine, no urgency or frequency.  No flank pain.  MS:  No joint pain or swelling.  No decreased range of motion.  No back pain.  Psych:  No change in mood or affect. No depression or anxiety.  No memory loss.     Objective:   Physical Exam Filed Vitals:   07/24/11 1037  BP: 120/60  Pulse: 80    Temp: 98.4 F (36.9 C)  TempSrc: Oral  Height: 5\' 9"  (1.753 m)  Weight: 142 lb (64.411 kg)  SpO2: 97%    Gen: Pleasant, well-nourished, in no distress,  normal affect  ENT: No lesions,  mouth clear,  oropharynx clear, no postnasal drip  Neck: No JVD, no TMG, no carotid bruits  Lungs: No use of accessory muscles, no dullness to percussion, clear without rales or rhonchi  Cardiovascular: RRR, heart sounds normal, no murmur or gallops, no peripheral edema  Abdomen: soft and NT, no HSM,  BS normal  Musculoskeletal: No deformities, no cyanosis or clubbing  Neuro: alert, non focal  Skin: Warm, no lesions or rashes  No results found.        Assessment & Plan:   ASTHMA Stable copd with recent complications of TKR with staph sepsis and epidural abscess and SDH, knee infection Plan No change in medications. Return in        4 months    Updated Medication List Outpatient Encounter Prescriptions as of 07/24/2011  Medication Sig Dispense Refill  . albuterol (PROVENTIL HFA;VENTOLIN HFA) 108 (90 BASE) MCG/ACT inhaler Inhale 1-2 puffs into the lungs every 6 (six) hours as needed. For shortness of breath      .  buPROPion (WELLBUTRIN SR) 100 MG 12 hr tablet Take 100 mg by mouth 2 (two) times daily.       . calcium carbonate (OS-CAL) 600 MG TABS Take 600 mg by mouth daily.       . diazepam (VALIUM) 5 MG tablet Take 5 mg by mouth every 8 (eight) hours as needed. For anxiety      . doxazosin (CARDURA) 4 MG tablet Take 4 mg by mouth 2 (two) times daily.        . Fluticasone-Salmeterol (ADVAIR DISKUS) 250-50 MCG/DOSE AEPB Inhale 1 puff into the lungs 2 (two) times daily.  60 each  11  . HYDROcodone-acetaminophen (NORCO) 5-325 MG per tablet Take 1 tablet by mouth every 6 (six) hours as needed. For pain  30 tablet  0  . meclizine (ANTIVERT) 25 MG tablet Take 25 mg by mouth 4 (four) times daily as needed. For vertigo      . montelukast (SINGULAIR) 10 MG tablet Take 1 tablet (10 mg total)  by mouth at bedtime.  30 tablet  11  . POTASSIUM PO Take 20 mEq by mouth daily.       Marland Kitchen rOPINIRole (REQUIP) 1 MG tablet Take 1 mg by mouth at bedtime.       . simvastatin (ZOCOR) 40 MG tablet Take 40 mg by mouth at bedtime.       Marland Kitchen zolpidem (AMBIEN) 5 MG tablet Take 5 mg by mouth at bedtime as needed. For sleep      . DISCONTD: Fluticasone-Salmeterol (ADVAIR DISKUS) 250-50 MCG/DOSE AEPB Take 1 puff by mouth 2 (two) times daily. Pt needs to schedule appt for further refills.  60 each  0  . DISCONTD: montelukast (SINGULAIR) 10 MG tablet Take 10 mg by mouth at bedtime.        . solifenacin (VESICARE) 5 MG tablet Take 5 mg by mouth daily. On hold      . DISCONTD: Flaxseed, Linseed, ([facility] FLAXSEED OIL) 1000 MG CAPS Take 1,000 mg by mouth daily.        Marland Kitchen DISCONTD: polyethylene glycol (MIRALAX / GLYCOLAX) packet Take 17 g by mouth daily.        Marland Kitchen DISCONTD: potassium chloride (KLOR-CON) 20 MEQ packet Take 20 mEq by mouth daily.        Marland Kitchen DISCONTD: triamterene-hydrochlorothiazide (MAXZIDE) 75-50 MG per tablet Take 1 tablet by mouth daily.

## 2011-07-24 NOTE — Patient Instructions (Signed)
Labs today Refills on advair and singulair sent Return 4 months

## 2011-07-25 LAB — BASIC METABOLIC PANEL
Chloride: 105 mEq/L (ref 96–112)
Potassium: 4.5 mEq/L (ref 3.5–5.3)
Sodium: 140 mEq/L (ref 135–145)

## 2011-07-25 NOTE — Assessment & Plan Note (Signed)
Stable copd with recent complications of TKR with staph sepsis and epidural abscess and SDH, knee infection Plan No change in medications. Return in        4 months

## 2011-08-29 ENCOUNTER — Ambulatory Visit: Payer: Medicare Other | Attending: Internal Medicine | Admitting: Physical Therapy

## 2011-08-29 DIAGNOSIS — IMO0001 Reserved for inherently not codable concepts without codable children: Secondary | ICD-10-CM | POA: Insufficient documentation

## 2011-08-29 DIAGNOSIS — R5381 Other malaise: Secondary | ICD-10-CM | POA: Insufficient documentation

## 2011-08-29 DIAGNOSIS — Z96659 Presence of unspecified artificial knee joint: Secondary | ICD-10-CM | POA: Insufficient documentation

## 2011-09-01 ENCOUNTER — Ambulatory Visit: Payer: Medicare Other | Admitting: Physical Therapy

## 2011-09-04 ENCOUNTER — Ambulatory Visit: Payer: Medicare Other | Admitting: Physical Therapy

## 2011-09-06 ENCOUNTER — Ambulatory Visit: Payer: Medicare Other | Admitting: Physical Therapy

## 2011-09-08 ENCOUNTER — Ambulatory Visit: Payer: Medicare Other | Admitting: Physical Therapy

## 2011-09-11 ENCOUNTER — Ambulatory Visit: Payer: Medicare Other | Admitting: Physical Therapy

## 2011-09-13 ENCOUNTER — Ambulatory Visit: Payer: Medicare Other | Admitting: Physical Therapy

## 2011-09-15 ENCOUNTER — Ambulatory Visit: Payer: Medicare Other | Attending: Internal Medicine | Admitting: Physical Therapy

## 2011-09-15 DIAGNOSIS — Z96659 Presence of unspecified artificial knee joint: Secondary | ICD-10-CM | POA: Insufficient documentation

## 2011-09-15 DIAGNOSIS — R5381 Other malaise: Secondary | ICD-10-CM | POA: Insufficient documentation

## 2011-09-15 DIAGNOSIS — IMO0001 Reserved for inherently not codable concepts without codable children: Secondary | ICD-10-CM | POA: Insufficient documentation

## 2011-09-18 ENCOUNTER — Ambulatory Visit: Payer: Medicare Other | Admitting: Physical Therapy

## 2011-09-20 ENCOUNTER — Other Ambulatory Visit (HOSPITAL_COMMUNITY): Payer: Self-pay | Admitting: Neurosurgery

## 2011-09-20 ENCOUNTER — Ambulatory Visit: Payer: Medicare Other | Admitting: Physical Therapy

## 2011-09-20 DIAGNOSIS — I609 Nontraumatic subarachnoid hemorrhage, unspecified: Secondary | ICD-10-CM

## 2011-09-20 DIAGNOSIS — R4182 Altered mental status, unspecified: Secondary | ICD-10-CM

## 2011-09-22 ENCOUNTER — Ambulatory Visit: Payer: Medicare Other | Admitting: Physical Therapy

## 2011-10-03 ENCOUNTER — Ambulatory Visit (HOSPITAL_COMMUNITY)
Admission: RE | Admit: 2011-10-03 | Discharge: 2011-10-03 | Disposition: A | Discharge status: Home / Self Care | Payer: Medicare Other | Source: Ambulatory Visit | Attending: Neurosurgery | Admitting: Neurosurgery

## 2011-10-03 DIAGNOSIS — I6200 Nontraumatic subdural hemorrhage, unspecified: Secondary | ICD-10-CM | POA: Insufficient documentation

## 2011-10-03 DIAGNOSIS — I609 Nontraumatic subarachnoid hemorrhage, unspecified: Secondary | ICD-10-CM

## 2011-10-03 DIAGNOSIS — R4182 Altered mental status, unspecified: Secondary | ICD-10-CM

## 2011-10-03 DIAGNOSIS — Z09 Encounter for follow-up examination after completed treatment for conditions other than malignant neoplasm: Secondary | ICD-10-CM | POA: Insufficient documentation

## 2011-10-25 ENCOUNTER — Other Ambulatory Visit: Payer: Self-pay | Admitting: Dermatology

## 2011-11-10 ENCOUNTER — Ambulatory Visit: Payer: Medicare Other | Admitting: Adult Health

## 2011-11-26 IMAGING — XA IR IVC FILTER PLMT / S&I /IMG GUID/MOD SED
1 series · 10 of 10 positions shown · IV contrast (IODINE)
Comparison: none

CLINICAL DATA: Left lower extremity DVT.  The patient is not a
candidate for anticoagulation due to recent subdural intracranial
hemorrhage as well as history of spinal epidural abscess.

[Series 300: dsa body · 10 of 10 slices shown]
[im 1/10]
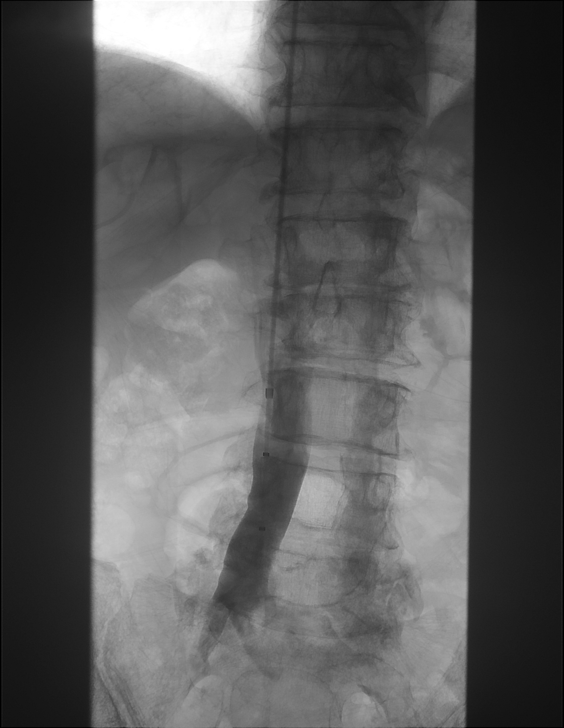
[im 2/10]
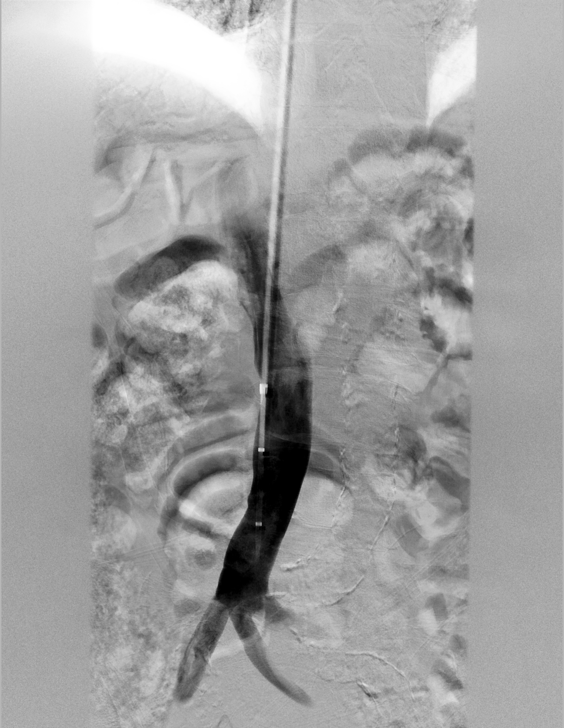
[im 3/10]
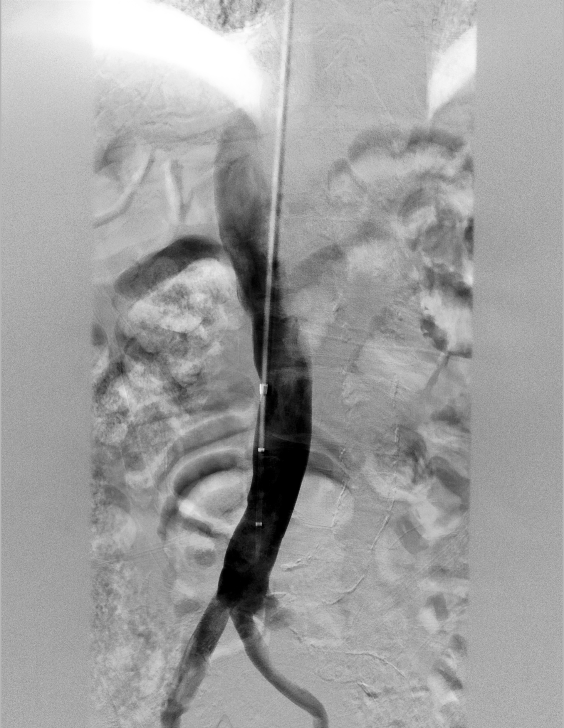
[im 4/10]
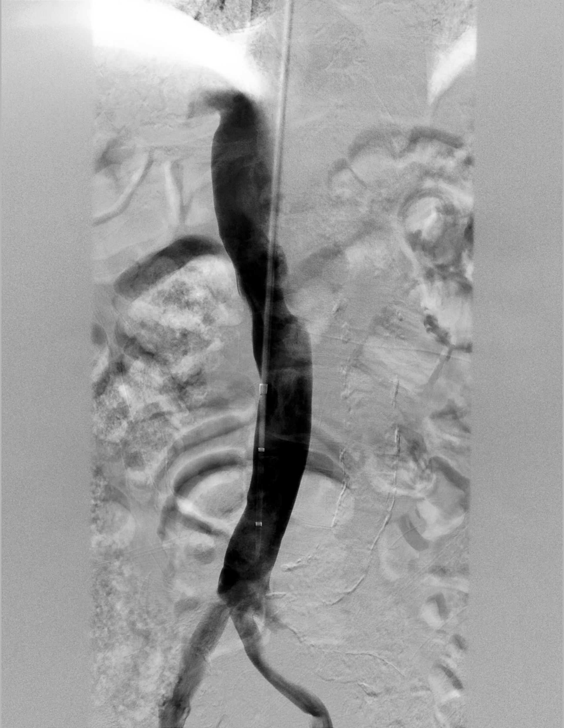
[im 5/10]
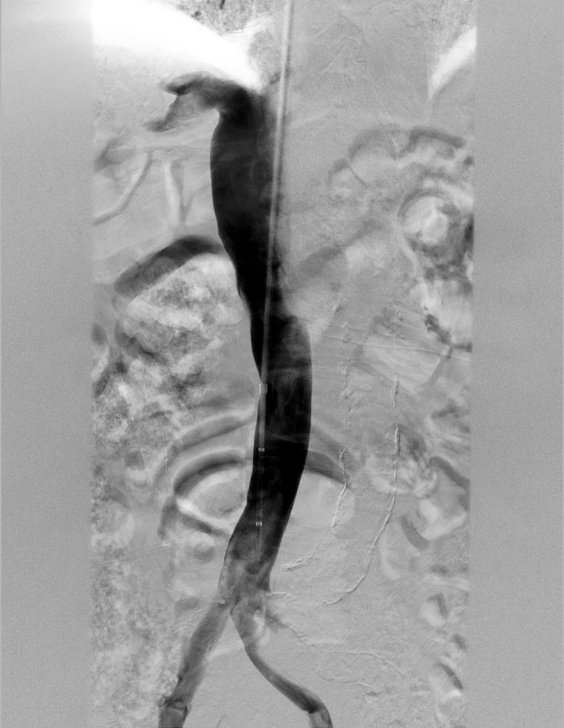
[im 6/10]
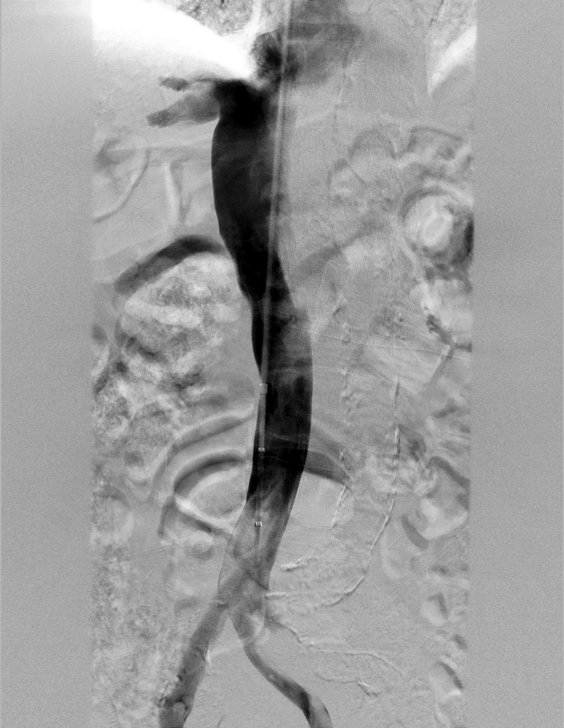
[im 7/10]
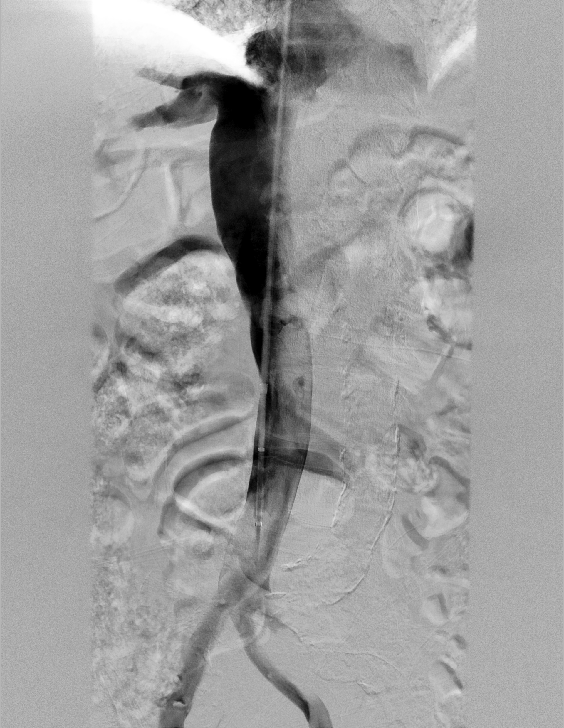
[im 8/10]
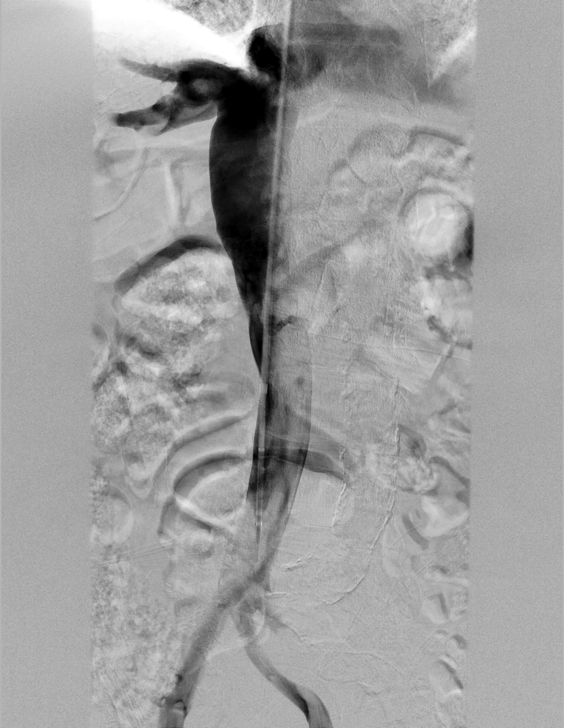
[im 9/10]
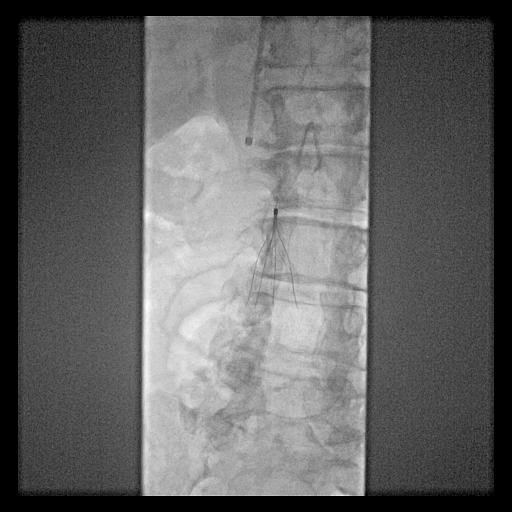
[im 10/10]
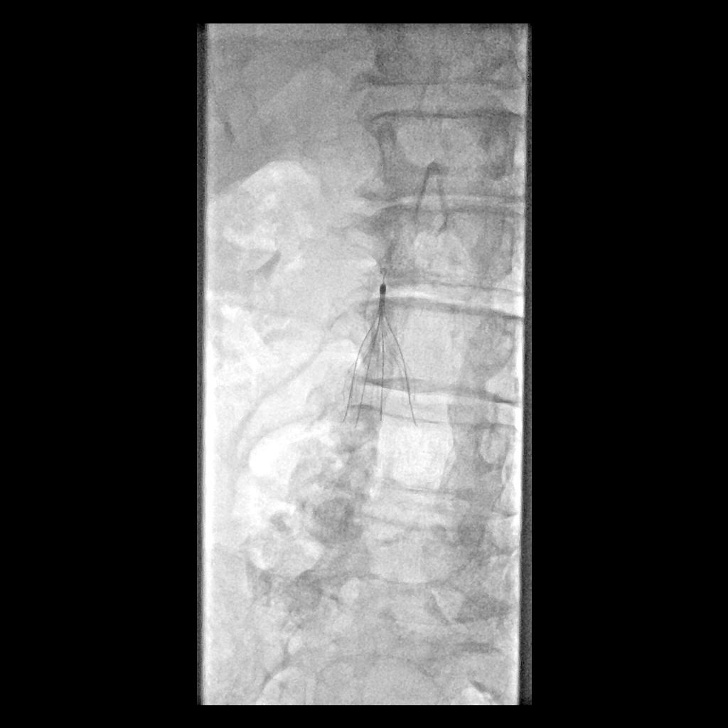

[10 of 10 positions shown; findings below may reference images not displayed]

1.  ULTRASOUND GUIDANCE FOR VASCULAR ACCESS OF THE RIGHT INTERNAL
JUGULAR VEIN.
2.  IVC VENOGRAM
3.  PERCUTANEOUS IVC FILTER PLACEMENT

Sedation:  1.0 mg IV Versed; 25 mcg IV Fentanyl.

Total Moderate Sedation Time:  10 minutes.

Contrast:  30 ml [id number]

Fluoroscopy Time: 0.9 minutes.

Procedure:  The procedure, risks, benefits, and alternatives were
explained to the patient.  Questions regarding the procedure were
encouraged and answered.  The patient understands and consents to
the procedure.

The right neck was prepped with Betadine in a sterile fashion, and
a sterile drape was applied covering the operative field.  A
sterile gown and sterile gloves were used for the procedure.  Local
anesthesia was provided with 1% Lidocaine.

Under direct ultrasound guidance, a 21 gauge needle was advanced
into the right internal jugular vein with ultrasound image
documentation performed.  After securing access with a
micropuncture dilator, a guidewire was advanced into the inferior
vena cava.  A deployment sheath was advanced over the guidewire.
This was utilized to perform IVC venography.

The deployment sheath was further positioned in an appropriate
location for filter deployment.  Fleur Joelle Labelle Salha IVC filter was then
advanced in the sheath.  This was then fully deployed in the
infrarenal IVC.  Final filter position was confirmed with a
fluoroscopic spot image.  Contrast injection was also performed
through the sheath under fluoroscopy to confirm patency of the IVC
at the level of the filter.  After the procedure the sheath was
removed and hemostasis obtained with manual compression.

Complications: None
FINDINGS: IVC venography demonstrates a normal caliber IVC with no
evidence of thrombus.  Renal veins are identified bilaterally.  The
IVC filter was successfully positioned below the level of the renal
veins and is appropriately oriented.  This IVC filter has both
permanent and retrievable indications.
IMPRESSION: Placement of percutaneous IVC filter in infrarenal IVC.  IVC
venogram shows no evidence of IVC thrombus and normal caliber of
the inferior vena cava.  This filter does have both permanent and
retrievable indications.

## 2011-11-27 ENCOUNTER — Encounter: Payer: Self-pay | Admitting: Critical Care Medicine

## 2011-11-27 ENCOUNTER — Ambulatory Visit (INDEPENDENT_AMBULATORY_CARE_PROVIDER_SITE_OTHER): Payer: Medicare Other | Admitting: Critical Care Medicine

## 2011-11-27 VITALS — BP 140/64 | HR 77 | Temp 97.8°F | Ht 69.5 in | Wt 156.0 lb

## 2011-11-27 DIAGNOSIS — J449 Chronic obstructive pulmonary disease, unspecified: Secondary | ICD-10-CM

## 2011-11-27 NOTE — Patient Instructions (Signed)
No change in medications. Return in        6 months        

## 2011-11-27 NOTE — Progress Notes (Signed)
Subjective:    Patient ID: Micheal Frank, male    DOB: 1925/11/14, 78 y.o.   MRN: [id number]  HPI  Pulmonary OV  This is an 78 y.o. male with Golds III COPD primary emphysema DLCO 69% FeV1 94% 10/09  This pt has been with [facility] MD in the past. Dx COPD for 20 yrs. Last pfts 11/08 at [facility]:  DLCO 58%. also OSA on CPAP    1/7 Prolonged and multiple hospitalizations in 2012.  Infected L TKR 5/12 with Staph aureus. Removed Knee in 8/12,  Complications of epidural abscess, SDH spontaneous from coumadin (6/12), Infected L TKR explanted and finally new TKR 11/12.  Lungs ok through this. Here for f/u for first time since 12/11.  Over past month dyspnea is ok.  Now at home,  No real mucus.  No chest pain.  No use of rescue inhaler.  DId not have to drain the hematoma in the brain.  Did drain the abscess in the back.  11/27/2011 Now Knee is ok and still notes some edema.  Dr August Saucer is Ortho.  Now no real cough.  Had a cough with the pollen, none now. No real wheeze.  No real chest pains.  No real mucus.   Level of dyspnea is the same.  Pt denies any significant sore throat, nasal congestion or excess secretions, fever, chills, sweats, unintended weight loss, pleurtic or exertional chest pain, orthopnea PND, or leg swelling Pt denies any increase in rescue therapy over baseline, denies waking up needing it or having any early am or nocturnal exacerbations of coughing/wheezing/or dyspnea. Pt also denies any obvious fluctuation in symptoms with  weather or environmental change or other alleviating or aggravating factors     Review of Systems Constitutional:   No  weight loss, night sweats,  Fevers, chills, fatigue, lassitude. HEENT:   No headaches,  Difficulty swallowing,  Tooth/dental problems,  Sore throat,                No sneezing, itching, ear ache, nasal congestion, post nasal drip,   CV:  No chest pain,  Orthopnea, PND, swelling in lower extremities, anasarca, dizziness,  palpitations  GI  No heartburn, indigestion, abdominal pain, nausea, vomiting, diarrhea, change in bowel habits, loss of appetite  Resp: No shortness of breath with exertion or at rest.  No excess mucus, no productive cough,  No non-productive cough,  No coughing up of blood.  No change in color of mucus.  No wheezing.  No chest wall deformity  Skin: no rash or lesions.  GU: no dysuria, change in color of urine, no urgency or frequency.  No flank pain.  MS:  No joint pain or swelling.  No decreased range of motion.  No back pain.  Psych:  No change in mood or affect. No depression or anxiety.  No memory loss.     Objective:   Physical Exam  Filed Vitals:   11/27/11 1126  BP: 140/64  Pulse: 77  Temp: 97.8 F (36.6 C)  TempSrc: Oral  Height: 5' 9.5" (1.765 m)  Weight: 156 lb (70.761 kg)  SpO2: 94%    Gen: Pleasant, well-nourished, in no distress,  normal affect  ENT: No lesions,  mouth clear,  oropharynx clear, no postnasal drip  Neck: No JVD, no TMG, no carotid bruits  Lungs: No use of accessory muscles, no dullness to percussion, clear without rales or rhonchi  Cardiovascular: RRR, heart sounds normal, no murmur or gallops, no peripheral  edema  Abdomen: soft and NT, no HSM,  BS normal  Musculoskeletal: No deformities, no cyanosis or clubbing  Neuro: alert, non focal  Skin: Warm, no lesions or rashes  No results found.        Assessment & Plan:   COPD Chronic obstructive lung disease stable at this time golds class C. Plan Maintain inhaled medications as prescribed     Updated Medication List Outpatient Encounter Prescriptions as of 11/27/2011  Medication Sig Dispense Refill  . albuterol-ipratropium (COMBIVENT) 18-103 MCG/ACT inhaler Inhale 2 puffs into the lungs as needed.      Marland Kitchen buPROPion (WELLBUTRIN SR) 100 MG 12 hr tablet Take 200 mg by mouth 2 (two) times daily.       . calcium carbonate (OS-CAL) 600 MG TABS Take 600 mg by mouth daily.       .  diazepam (VALIUM) 5 MG tablet Take 5 mg by mouth every 8 (eight) hours as needed. For anxiety      . Fluticasone-Salmeterol (ADVAIR DISKUS) 250-50 MCG/DOSE AEPB Inhale 1 puff into the lungs 2 (two) times daily.  60 each  11  . HYDROcodone-acetaminophen (NORCO) 5-325 MG per tablet Take 1 tablet by mouth every 6 (six) hours as needed. For pain  30 tablet  0  . meclizine (ANTIVERT) 25 MG tablet Take 25 mg by mouth 4 (four) times daily as needed. For vertigo      . montelukast (SINGULAIR) 10 MG tablet Take 1 tablet (10 mg total) by mouth at bedtime.  30 tablet  11  . Multiple Vitamins-Minerals (MULTIVITAL PO) Take 1 tablet by mouth daily.      Marland Kitchen POTASSIUM PO Take 20 mEq by mouth daily.       Marland Kitchen rOPINIRole (REQUIP) 1 MG tablet Take 1 mg by mouth at bedtime.       . simvastatin (ZOCOR) 40 MG tablet Take 40 mg by mouth at bedtime.       . triamterene (DYRENIUM) 50 MG capsule Take 75 mg by mouth. 1 - 2 daily      . zolpidem (AMBIEN) 5 MG tablet Take 5 mg by mouth at bedtime as needed. For sleep      . DISCONTD: doxazosin (CARDURA) 4 MG tablet Take 4 mg by mouth 2 (two) times daily.        Marland Kitchen DISCONTD: albuterol (PROVENTIL HFA;VENTOLIN HFA) 108 (90 BASE) MCG/ACT inhaler Inhale 1-2 puffs into the lungs every 6 (six) hours as needed. For shortness of breath      . DISCONTD: solifenacin (VESICARE) 5 MG tablet Take 5 mg by mouth daily. On hold

## 2011-11-27 NOTE — Assessment & Plan Note (Signed)
Chronic obstructive lung disease stable at this time golds class C. Plan Maintain inhaled medications as prescribed

## 2011-11-30 ENCOUNTER — Telehealth: Payer: Self-pay | Admitting: Critical Care Medicine

## 2011-11-30 ENCOUNTER — Ambulatory Visit (INDEPENDENT_AMBULATORY_CARE_PROVIDER_SITE_OTHER): Payer: Medicare Other | Admitting: Critical Care Medicine

## 2011-11-30 ENCOUNTER — Encounter: Payer: Self-pay | Admitting: Critical Care Medicine

## 2011-11-30 VITALS — BP 122/58 | HR 70 | Temp 97.7°F | Ht 69.0 in | Wt 154.0 lb

## 2011-11-30 DIAGNOSIS — J449 Chronic obstructive pulmonary disease, unspecified: Secondary | ICD-10-CM

## 2011-11-30 MED ORDER — BECLOMETHASONE DIPROPIONATE 80 MCG/ACT NA AERS: 2.0000 | INHALATION_SPRAY | Freq: Every day | NASAL | Status: DC

## 2011-11-30 MED ORDER — AZITHROMYCIN 250 MG PO TABS: 250.0000 mg | ORAL_TABLET | Freq: Every day | ORAL | Status: AC

## 2011-11-30 MED ORDER — PREDNISONE 10 MG PO TABS: ORAL_TABLET | ORAL | Status: DC

## 2011-11-30 NOTE — Telephone Encounter (Signed)
[facility]W with cancellation in [place] this morning at 1130 this morning.  ATC pt x2 to offer appt.  WCB.

## 2011-11-30 NOTE — Progress Notes (Signed)
Subjective:    Patient ID: Micheal Frank, male    DOB: 15-Feb-1926, 78 y.o.   MRN: [id number]  HPI  Pulmonary OV  This is an 78 y.o. male with Golds III COPD primary emphysema DLCO 69% FeV1 94% 10/09  This pt has been with [facility] MD in the past. Dx COPD for 20 yrs. Last pfts 11/08 at [facility]:  DLCO 58%. also OSA on CPAP    1/7 Prolonged and multiple hospitalizations in 2012.  Infected L TKR 5/12 with Staph aureus. Removed Knee in 8/12,  Complications of epidural abscess, SDH spontaneous from coumadin (6/12), Infected L TKR explanted and finally new TKR 11/12.  Lungs ok through this. Here for f/u for first time since 12/11.  Over past month dyspnea is ok.  Now at home,  No real mucus.  No chest pain.  No use of rescue inhaler.  DId not have to drain the hematoma in the brain.  Did drain the abscess in the back.  11/27/2011 Now Knee is ok and still notes some edema.  Dr August Saucer is Ortho.  Now no real cough.  Had a cough with the pollen, none now. No real wheeze.  No real chest pains.  No real mucus.   Level of dyspnea is the same.  Pt denies any significant sore throat, nasal congestion or excess secretions, fever, chills, sweats, unintended weight loss, pleurtic or exertional chest pain, orthopnea PND, or leg swelling Pt denies any increase in rescue therapy over baseline, denies waking up needing it or having any early am or nocturnal exacerbations of coughing/wheezing/or dyspnea. Pt also denies any obvious fluctuation in symptoms with  weather or environmental change or other alleviating or aggravating factors   11/30/2011 Pt just seen. Then 24hr later developed increased pndrip, cough, No real change in dyspnea.   No pndrip.  Cough is dry.  Throat is sore Pt denies any significant sore throat, nasal congestion or excess secretions, fever, chills, sweats, unintended weight loss, pleurtic or exertional chest pain, orthopnea PND, or leg swelling Pt denies any increase in rescue  therapy over baseline, denies waking up needing it or having any early am or nocturnal exacerbations of coughing/wheezing/or dyspnea. Pt also denies any obvious fluctuation in symptoms with  weather or environmental change or other alleviating or aggravating factors     Review of Systems Constitutional:   No  weight loss, night sweats,  Fevers, chills, fatigue, lassitude. HEENT:   No headaches,  Difficulty swallowing,  Tooth/dental problems,  Sore throat,                No sneezing, itching, ear ache, nasal congestion, post nasal drip,   CV:  No chest pain,  Orthopnea, PND, swelling in lower extremities, anasarca, dizziness, palpitations  GI  No heartburn, indigestion, abdominal pain, nausea, vomiting, diarrhea, change in bowel habits, loss of appetite  Resp: No shortness of breath with exertion or at rest.  No excess mucus, no productive cough,  No non-productive cough,  No coughing up of blood.  No change in color of mucus.  No wheezing.  No chest wall deformity  Skin: no rash or lesions.  GU: no dysuria, change in color of urine, no urgency or frequency.  No flank pain.  MS:  No joint pain or swelling.  No decreased range of motion.  No back pain.  Psych:  No change in mood or affect. No depression or anxiety.  No memory loss.     Objective:  Physical Exam  Filed Vitals:   11/30/11 1219  BP: 122/58  Pulse: 70  Temp: 97.7 F (36.5 C)  TempSrc: Oral  Height: 5\' 9"  (1.753 m)  Weight: 154 lb (69.854 kg)  SpO2: 94%    Gen: Pleasant, well-nourished, in no distress,  normal affect  ENT: No lesions,  mouth clear,  oropharynx clear, +++ postnasal drip, moderate nasal urethane and edema with clear mucus  Neck: No JVD, no TMG, no carotid bruits  Lungs: No use of accessory muscles, no dullness to percussion, clear without rales or rhonchi  Cardiovascular: RRR, heart sounds normal, no murmur or gallops, no peripheral edema  Abdomen: soft and NT, no HSM,  BS  normal  Musculoskeletal: No deformities, no cyanosis or clubbing  Neuro: alert, non focal  Skin: Warm, no lesions or rashes  No results found.        Assessment & Plan:   COPD Chronic obstructive lung disease with asthma component now with acute flare due to to severe allergic rhinitis with postnasal drip syndrome and acute tracheobronchitis Plan Azithromycin for 5 days Brief prednisone pulse Qnasl x2 samples 2 sprays each nostril daily     Updated Medication List Outpatient Encounter Prescriptions as of 11/30/2011  Medication Sig Dispense Refill  . albuterol-ipratropium (COMBIVENT) 18-103 MCG/ACT inhaler Inhale 2 puffs into the lungs as needed.      Marland Kitchen buPROPion (WELLBUTRIN SR) 100 MG 12 hr tablet Take 200 mg by mouth 2 (two) times daily.       . calcium carbonate (OS-CAL) 600 MG TABS Take 600 mg by mouth daily.       . diazepam (VALIUM) 5 MG tablet Take 5 mg by mouth every 8 (eight) hours as needed. For anxiety      . doxazosin (CARDURA) 4 MG tablet Take 1 tablet by mouth Twice daily.      . Fluticasone-Salmeterol (ADVAIR DISKUS) 250-50 MCG/DOSE AEPB Inhale 1 puff into the lungs 2 (two) times daily.  60 each  11  . HYDROcodone-acetaminophen (NORCO) 5-325 MG per tablet Take 1 tablet by mouth every 6 (six) hours as needed. For pain  30 tablet  0  . meclizine (ANTIVERT) 25 MG tablet Take 25 mg by mouth 4 (four) times daily as needed. For vertigo      . montelukast (SINGULAIR) 10 MG tablet Take 1 tablet (10 mg total) by mouth at bedtime.  30 tablet  11  . Multiple Vitamins-Minerals (MULTIVITAL PO) Take 1 tablet by mouth daily.      Marland Kitchen rOPINIRole (REQUIP) 1 MG tablet Take 1 mg by mouth at bedtime.       . simvastatin (ZOCOR) 40 MG tablet Take 40 mg by mouth at bedtime.       . triamterene (DYRENIUM) 50 MG capsule Take 75 mg by mouth. 1 - 2 daily      . azithromycin (ZITHROMAX) 250 MG tablet Take 1 tablet (250 mg total) by mouth daily. Take two once then one daily until gone  6  each  0  . Beclomethasone Dipropionate (QNASL) 80 MCG/ACT AERS Place 2 puffs into the nose daily.  1 Inhaler  1  . predniSONE (DELTASONE) 10 MG tablet Take 4 for two days three for two days two for two days one for two days  20 tablet  0  . DISCONTD: POTASSIUM PO Take 20 mEq by mouth daily.       Marland Kitchen DISCONTD: zolpidem (AMBIEN) 5 MG tablet Take 5 mg by mouth at bedtime as  needed. For sleep

## 2011-11-30 NOTE — Assessment & Plan Note (Signed)
Chronic obstructive lung disease with asthma component now with acute flare due to to severe allergic rhinitis with postnasal drip syndrome and acute tracheobronchitis Plan Azithromycin for 5 days Brief prednisone pulse Qnasl x2 samples 2 sprays each nostril daily

## 2011-11-30 NOTE — Telephone Encounter (Signed)
Was able to reach pt.  appt scheduled with PEW today at 12N in [place].  Pt aware and will close this message.

## 2011-11-30 NOTE — Telephone Encounter (Signed)
Called spoke with patient who saw PEW on 5.13.13 and following night developed a dry cough, increased SOB, and feels that he has mucus in the back of this throat.  Denies wheezing, f/c/s.  Requesting therapy over the phone and appt.  PEW with no openings in [place], TP not in office today.  Acute ov scheduled with [facility] tomorrow morning at 9:45am.  Pt okay with this date/time/provider.  Will forward to PEW for recs regarding treatment today for pt.  [facility]. Allergies  Allergen Reactions  . Oysters (Shellfish Allergy) Other (See Comments)    Vomiting and passed   Dr Delford Field please advise, thanks.

## 2011-11-30 NOTE — Patient Instructions (Signed)
QNasl two puff each nostril daily (use samples until samples gone) Prednisone 10mg  Take 4 for two days three for two days two for two days one for two days Azithromycin 250mg  Take two once then one daily until gone No other medication changes Return 6 weeks

## 2011-12-01 ENCOUNTER — Ambulatory Visit: Payer: Medicare Other | Admitting: Pulmonary Disease

## 2011-12-18 ENCOUNTER — Telehealth: Payer: Self-pay | Admitting: Critical Care Medicine

## 2011-12-18 MED ORDER — PREDNISONE 10 MG PO TABS: ORAL_TABLET | ORAL | Status: DC

## 2011-12-18 MED ORDER — MOXIFLOXACIN HCL 400 MG PO TABS: 400.0000 mg | ORAL_TABLET | Freq: Every day | ORAL | Status: AC

## 2011-12-18 NOTE — Telephone Encounter (Signed)
Called and spoke with pt and he is aware of pred taper and avelox that has been sent to his pharmacy per PW.  Pt voiced his understanding of both meds and nothing further needed.

## 2011-12-18 NOTE — Telephone Encounter (Signed)
Spoke with pt. He c/o increased cough x 5 days with large amounts of cream colored sputum. He states chest feels more congested. Pt denies any changes in breathing, wheezing, CP, f/c/s. No openings this wk with any provider. PW, please advise, thanks! Allergies  Allergen Reactions  . Oysters (Shellfish Allergy) Other (See Comments)    Vomiting and passed

## 2011-12-18 NOTE — Telephone Encounter (Signed)
Call in avelox 400/d x 5day Call in prednisone 10mg  Take 4 for three days 3 for three days 2 for three days 1 for three days and stop  #30

## 2011-12-25 ENCOUNTER — Other Ambulatory Visit (HOSPITAL_COMMUNITY): Payer: Self-pay | Admitting: Neurosurgery

## 2011-12-25 DIAGNOSIS — I609 Nontraumatic subarachnoid hemorrhage, unspecified: Secondary | ICD-10-CM

## 2012-01-05 ENCOUNTER — Ambulatory Visit (HOSPITAL_COMMUNITY)
Admission: RE | Admit: 2012-01-05 | Discharge: 2012-01-05 | Disposition: A | Discharge status: Home / Self Care | Payer: Medicare Other | Source: Ambulatory Visit | Attending: Neurosurgery | Admitting: Neurosurgery

## 2012-01-05 DIAGNOSIS — I609 Nontraumatic subarachnoid hemorrhage, unspecified: Secondary | ICD-10-CM | POA: Insufficient documentation

## 2012-01-25 ENCOUNTER — Encounter: Payer: Self-pay | Admitting: Critical Care Medicine

## 2012-01-25 ENCOUNTER — Ambulatory Visit (INDEPENDENT_AMBULATORY_CARE_PROVIDER_SITE_OTHER): Payer: Medicare Other | Admitting: Critical Care Medicine

## 2012-01-25 ENCOUNTER — Ambulatory Visit: Payer: Medicare Other | Admitting: Critical Care Medicine

## 2012-01-25 VITALS — BP 140/82 | HR 79 | Temp 97.5°F | Ht 69.0 in | Wt 160.0 lb

## 2012-01-25 DIAGNOSIS — J449 Chronic obstructive pulmonary disease, unspecified: Secondary | ICD-10-CM

## 2012-01-25 DIAGNOSIS — J4489 Other specified chronic obstructive pulmonary disease: Secondary | ICD-10-CM

## 2012-01-25 MED ORDER — FLUTICASONE-SALMETEROL 250-50 MCG/DOSE IN AEPB: 1.0000 | INHALATION_SPRAY | Freq: Two times a day (BID) | RESPIRATORY_TRACT | Status: DC

## 2012-01-25 NOTE — Patient Instructions (Addendum)
No xray needed No medication changes Return 4 months

## 2012-01-25 NOTE — Progress Notes (Signed)
Subjective:    Patient ID: Micheal Frank, male    DOB: 1926/07/05, 78 y.o.   MRN: [id number]  HPI  Pulmonary OV  This is an 78 y.o. male with Golds III COPD primary emphysema DLCO 69% FeV1 94% 10/09  This pt has been with [facility] MD in the past. Dx COPD for 20 yrs. Last pfts 11/08 at [facility]:  DLCO 58%. also OSA on CPAP    01/25/2012 Pt has seen his PCP since 5/13 ov and rx claritin. This seemed to help.  Now cough is present but is left.  Pt fell over a trailer hitch and hurt the back two weeks ago.  Pt is worse with a deep breath.  No xrays yet done.  Pt notes is worse with a deep breath.  Dyspnea is at baseline.   Review of Systems Constitutional:   No  weight loss, night sweats,  Fevers, chills, fatigue, lassitude. HEENT:   No headaches,  Difficulty swallowing,  Tooth/dental problems,  Sore throat,                No sneezing, itching, ear ache, nasal congestion, post nasal drip,   CV:  No chest pain,  Orthopnea, PND, swelling in lower extremities, anasarca, dizziness, palpitations  GI  No heartburn, indigestion, abdominal pain, nausea, vomiting, diarrhea, change in bowel habits, loss of appetite  Resp: No shortness of breath with exertion or at rest.  No excess mucus, no productive cough,  No non-productive cough,  No coughing up of blood.  No change in color of mucus.  No wheezing.  No chest wall deformity  Skin: no rash or lesions.  GU: no dysuria, change in color of urine, no urgency or frequency.  No flank pain.  MS:  No joint pain or swelling.  No decreased range of motion.  No back pain.  Psych:  No change in mood or affect. No depression or anxiety.  No memory loss.     Objective:   Physical Exam  Filed Vitals:   01/25/12 1526  BP: 140/82  Pulse: 79  Temp: 97.5 F (36.4 C)  TempSrc: Oral  Height: 5\' 9"  (1.753 m)  Weight: 160 lb (72.576 kg)  SpO2: 92%    Gen: Pleasant, well-nourished, in no distress,  normal affect  ENT: No lesions,  mouth clear,   oropharynx clear, + postnasal drip  Neck: No JVD, no TMG, no carotid bruits  Lungs: No use of accessory muscles, no dullness to percussion, distant breath sounds with bruising in the right lateral chest wall  Cardiovascular: RRR, heart sounds normal, no murmur or gallops, no peripheral edema  Abdomen: soft and NT, no HSM,  BS normal  Musculoskeletal: No deformities, no cyanosis or clubbing  Neuro: alert, non focal  Skin: Warm, no lesions or rashes  No results found.        Assessment & Plan:   COPD Obstructive lung disease with asthmatic bronchitic component stable at this time Recent acute exacerbation now improved Plan Maintain inhaled medications as prescribed Return  4  months   Recent fall with bruising the right middle chest wall but without evidence of rib fracture    Updated Medication List Outpatient Encounter Prescriptions as of 01/25/2012  Medication Sig Dispense Refill  . albuterol-ipratropium (COMBIVENT) 18-103 MCG/ACT inhaler Inhale 2 puffs into the lungs as needed.      . Beclomethasone Dipropionate (QNASL) 80 MCG/ACT AERS Place 2 puffs into the nose daily.  1 Inhaler  1  .  buPROPion (WELLBUTRIN SR) 100 MG 12 hr tablet Take 200 mg by mouth 2 (two) times daily.       . calcium carbonate (OS-CAL) 600 MG TABS Take 600 mg by mouth daily.       . diazepam (VALIUM) 5 MG tablet Take 5 mg by mouth every 8 (eight) hours as needed. For anxiety      . doxazosin (CARDURA) 4 MG tablet Take 1 tablet by mouth Twice daily.      . Fluticasone-Salmeterol (ADVAIR DISKUS) 250-50 MCG/DOSE AEPB Inhale 1 puff into the lungs 2 (two) times daily.  60 each  11  . HYDROcodone-acetaminophen (NORCO) 5-325 MG per tablet Take 1 tablet by mouth every 6 (six) hours as needed. For pain  30 tablet  0  . meclizine (ANTIVERT) 25 MG tablet Take 25 mg by mouth 4 (four) times daily as needed. For vertigo      . montelukast (SINGULAIR) 10 MG tablet Take 1 tablet (10 mg total) by mouth at  bedtime.  30 tablet  11  . Multiple Vitamins-Minerals (MULTIVITAL PO) Take 1 tablet by mouth daily.      Marland Kitchen rOPINIRole (REQUIP) 1 MG tablet Take 1 mg by mouth at bedtime.       . simvastatin (ZOCOR) 40 MG tablet Take 40 mg by mouth at bedtime.       . triamterene (DYRENIUM) 50 MG capsule Take 75 mg by mouth. 1 - 2 daily      . DISCONTD: Fluticasone-Salmeterol (ADVAIR DISKUS) 250-50 MCG/DOSE AEPB Inhale 1 puff into the lungs 2 (two) times daily.  60 each  11  . DISCONTD: predniSONE (DELTASONE) 10 MG tablet 4 tabs x 3 days, 3 tabs x 3 days, 2 tabs x 3 days, 1 tab x 3 days then stop  30 tablet  0

## 2012-01-25 NOTE — Assessment & Plan Note (Addendum)
Obstructive lung disease with asthmatic bronchitic component stable at this time Recent acute exacerbation now improved Plan Maintain inhaled medications as prescribed Return  4  months

## 2012-03-03 ENCOUNTER — Emergency Department (HOSPITAL_COMMUNITY): Payer: Medicare Other

## 2012-03-03 ENCOUNTER — Encounter (HOSPITAL_COMMUNITY): Payer: Self-pay | Admitting: Emergency Medicine

## 2012-03-03 ENCOUNTER — Emergency Department (HOSPITAL_COMMUNITY)
Admission: EM | Admit: 2012-03-03 | Discharge: 2012-03-03 | Disposition: A | Discharge status: Home / Self Care | Payer: Medicare Other | Attending: Emergency Medicine | Admitting: Emergency Medicine

## 2012-03-03 DIAGNOSIS — J4489 Other specified chronic obstructive pulmonary disease: Secondary | ICD-10-CM | POA: Insufficient documentation

## 2012-03-03 DIAGNOSIS — W19XXXA Unspecified fall, initial encounter: Secondary | ICD-10-CM | POA: Insufficient documentation

## 2012-03-03 DIAGNOSIS — Z79899 Other long term (current) drug therapy: Secondary | ICD-10-CM | POA: Insufficient documentation

## 2012-03-03 DIAGNOSIS — E785 Hyperlipidemia, unspecified: Secondary | ICD-10-CM | POA: Insufficient documentation

## 2012-03-03 DIAGNOSIS — Y9301 Activity, walking, marching and hiking: Secondary | ICD-10-CM | POA: Insufficient documentation

## 2012-03-03 DIAGNOSIS — G4730 Sleep apnea, unspecified: Secondary | ICD-10-CM | POA: Insufficient documentation

## 2012-03-03 DIAGNOSIS — S43109A Unspecified dislocation of unspecified acromioclavicular joint, initial encounter: Secondary | ICD-10-CM | POA: Insufficient documentation

## 2012-03-03 DIAGNOSIS — G319 Degenerative disease of nervous system, unspecified: Secondary | ICD-10-CM | POA: Insufficient documentation

## 2012-03-03 DIAGNOSIS — Z8739 Personal history of other diseases of the musculoskeletal system and connective tissue: Secondary | ICD-10-CM | POA: Insufficient documentation

## 2012-03-03 DIAGNOSIS — J449 Chronic obstructive pulmonary disease, unspecified: Secondary | ICD-10-CM | POA: Insufficient documentation

## 2012-03-03 MED ORDER — OXYCODONE-ACETAMINOPHEN 5-325 MG PO TABS
1.0000 | ORAL_TABLET | ORAL | Status: DC | PRN
  Administered 2012-03-03: 1 via ORAL
  Filled 2012-03-03: qty 1

## 2012-03-03 MED ORDER — OXYCODONE-ACETAMINOPHEN 5-325 MG PO TABS: 1.0000 | ORAL_TABLET | ORAL | Status: AC | PRN

## 2012-03-03 NOTE — Progress Notes (Signed)
Orthopedic Tech Progress Note Patient Details:  Micheal Frank [birth year] [id number]  Ortho Devices Type of Ortho Device: Arm foam sling Ortho Device/Splint Location: left arm Ortho Device/Splint Interventions: Application   Nikki Dom 03/03/2012, 8:09 PM

## 2012-03-03 NOTE — ED Notes (Signed)
Pt brought in by EMS after he tripped over a bucket in his garage. Pt denies becoming lightheaded or dizzy prior to the fall. Pt uses cane when ambulating, did not have cane when he fell. Skin tear to left elbow.

## 2012-03-03 NOTE — ED Provider Notes (Signed)
History     CSN: [id number]  Arrival date & time 03/03/12  1821   First MD Initiated Contact with Patient 03/03/12 1839      Chief Complaint  Patient presents with  . Shoulder Pain    (Consider location/radiation/quality/duration/timing/severity/associated sxs/prior treatment) Patient is a 78 y.o. male presenting with fall.  Fall The accident occurred 1 to 2 hours ago. The fall occurred while walking. He fell from a height of 3 to 5 ft. He landed on concrete. The volume of blood lost was minimal. The point of impact was the left shoulder, left elbow, left hip, left knee and head. The pain is present in the left shoulder and left knee. The pain is moderate. He was ambulatory at the scene. Pertinent negatives include no headaches and no loss of consciousness. Exacerbated by: arm abduction. Treatment on scene includes a c-collar and a backboard. He has tried nothing for the symptoms.    Past Medical History  Diagnosis Date  . Subdural hematoma 03/30/11  . Epidural abscess   . Staphylococcus aureus bacteremia with sepsis   . Prosthetic joint infection   . Hyperlipidemia   . History of blood clots   . Blood dyscrasia     pt CAN NOT have any blood thinners d/t hx brain bleed  . S/P right heart catheterization     at least 62yrs  . Asthma   . COPD (chronic obstructive pulmonary disease)     emphysema  . Shortness of breath     with exertion  . Pneumonia     as a child and in 1952;had a pneumonia vaccine couple of years ago  . Peripheral neuropathy   . Short-term memory loss   . Arthritis     left leg also has swelling  . Gastric ulcer   . Constipation     miralax prn  . Depression     takes wellbutrin bid  . Urinary frequency     wears depends  . Complication of anesthesia     confusion with anesthesia  . Sleep apnea     has CPAP but doesn't use it;sleep study done at least 8yrs ago    Past Surgical History  Procedure Date  . Hernia repair     25+yrs ago  . External  ear surgery     shunt placed in right ear d/t mennieres   . Total knee arthroplasty     x 2 left knee 2003/2012  . Filtering procedure     filter placed in neck d/t blood  clot  . Eye surgery     bil cataract surgery  . Skin biopsy     melanoma on head   . Knee arthroscopy   . Total knee revision 05/23/2011    Procedure: TOTAL KNEE REVISION;  Surgeon: [name];  Location: [facility] OR;  Service: Orthopedics;  Laterality: Left;  LEFT KNEE REMOVAL OF SPACER, POSSIBLE REIMPLANTATION OF REVISION TKA VERSES REPLACEMENT ANTIBIOTIC SPACER  . Epidural abscess drainage 12/2010    History reviewed. No pertinent family history.  History  Substance Use Topics  . Smoking status: Former Smoker -- 2.0 packs/day for 40 years    Types: Cigarettes    Quit date: 07/17/1978  . Smokeless tobacco: Never Used  . Alcohol Use: No      Review of Systems  Neurological: Negative for loss of consciousness and headaches.  All other systems reviewed and are negative.    Allergies  Oysters  Home Medications  Current Outpatient Rx  Name Route Sig Dispense Refill  . IPRATROPIUM-ALBUTEROL 18-103 MCG/ACT IN AERO Inhalation Inhale 2 puffs into the lungs as needed.    . BECLOMETHASONE DIPROPIONATE 80 MCG/ACT NA AERS Nasal Place 2 puffs into the nose daily. 1 Inhaler 1  . BUPROPION HCL ER (SR) 100 MG PO TB12 Oral Take 200 mg by mouth 2 (two) times daily.     Marland Kitchen CALCIUM CARBONATE 600 MG PO TABS Oral Take 600 mg by mouth daily.     Marland Kitchen DIAZEPAM 5 MG PO TABS Oral Take 5 mg by mouth every 8 (eight) hours as needed. For anxiety    . DOXAZOSIN MESYLATE 4 MG PO TABS Oral Take 1 tablet by mouth Twice daily.    Marland Kitchen FLUTICASONE-SALMETEROL 250-50 MCG/DOSE IN AEPB Inhalation Inhale 1 puff into the lungs 2 (two) times daily. 60 each 11  . HYDROCODONE-ACETAMINOPHEN 5-325 MG PO TABS Oral Take 1 tablet by mouth every 6 (six) hours as needed. For pain 30 tablet 0  . MECLIZINE HCL 25 MG PO TABS Oral Take 25 mg by mouth 4  (four) times daily as needed. For vertigo    . MONTELUKAST SODIUM 10 MG PO TABS Oral Take 1 tablet (10 mg total) by mouth at bedtime. 30 tablet 11  . MULTIVITAL PO Oral Take 1 tablet by mouth daily.    Marland Kitchen ROPINIROLE HCL 1 MG PO TABS Oral Take 1 mg by mouth at bedtime.     Marland Kitchen SIMVASTATIN 40 MG PO TABS Oral Take 40 mg by mouth at bedtime.     . TRIAMTERENE 50 MG PO CAPS Oral Take 75 mg by mouth. 1 - 2 daily      BP 129/76  Pulse 68  Temp 97.9 F (36.6 C) (Oral)  Resp 16  Ht 5\' 10"  (1.778 m)  Wt 160 lb (72.576 kg)  BMI 22.96 kg/m2  SpO2 96%  Physical Exam  Constitutional: He appears well-developed and well-nourished. No distress.  HENT:  Head: Normocephalic and atraumatic.  Mouth/Throat: Uvula is midline, oropharynx is clear and moist and mucous membranes are normal.  Eyes: EOM are normal. Right pupil is reactive. Left pupil is reactive. Pupils are unequal (cataract).  Musculoskeletal:       Right shoulder: Normal.       Left shoulder: He exhibits decreased range of motion (limited by pain in shoulder), tenderness, bony tenderness and deformity. He exhibits no laceration.       Right elbow: Normal.      Left elbow: He exhibits laceration (skin tear, ~2cm, posterior). He exhibits normal range of motion, no swelling, no effusion and no deformity. no tenderness found.       Right wrist: Normal.       Left wrist: Normal.       Right hip: Normal.       Left hip: Normal.       Right knee: Normal.       Left knee: He exhibits normal range of motion, no deformity and no erythema. tenderness (anterolateral) found.       Right ankle: Normal.       Left ankle: Normal.       Right upper arm: Normal.       Left upper arm: Normal.       Right forearm: Normal.       Left forearm: Normal.       Right hand: Normal.       Left hand: Normal.  Right upper leg: Normal.       Left upper leg: Normal.       Right lower leg: Normal.       Left lower leg: Normal.       Right foot: Normal.        Left foot: Normal.    ED Course  Procedures (including critical care time)  Labs Reviewed - No data to display Ct Head Wo Contrast  03/03/2012  *RADIOLOGY REPORT*  Clinical Data:  Status post fall.  Headache.  CT HEAD WITHOUT CONTRAST CT CERVICAL SPINE WITHOUT CONTRAST  Technique:  Multidetector CT imaging of the head and cervical spine was performed following the standard protocol without intravenous contrast.  Multiplanar CT image reconstructions of the cervical spine were also generated.  Comparison:  Head CT scan 03/03/2012 and 01/05/2012  CT HEAD  Findings: There is cortical atrophy, unchanged.  Small, old right frontal subdural hematoma is unchanged.  There is no new hemorrhage.  Chronic microvascular ischemic change is noted.  No evidence of acute infarct, mass, midline shift or hydrocephalus. The patient is status post right mastoidectomy.  The calvarium is intact.  IMPRESSION: No acute finding.  Stable compared prior exam.  CT CERVICAL SPINE  Findings: No fracture or subluxation of the cervical spine is identified.  There is multilevel degenerative disease.  Lung apices demonstrate emphysema.  IMPRESSION: No acute finding.  Original Report Authenticated By: Bernadene Bell. Maricela Curet, M.D.   Ct Cervical Spine Wo Contrast  03/03/2012  *RADIOLOGY REPORT*  Clinical Data:  Status post fall.  Headache.  CT HEAD WITHOUT CONTRAST CT CERVICAL SPINE WITHOUT CONTRAST  Technique:  Multidetector CT imaging of the head and cervical spine was performed following the standard protocol without intravenous contrast.  Multiplanar CT image reconstructions of the cervical spine were also generated.  Comparison:  Head CT scan 03/03/2012 and 01/05/2012  CT HEAD  Findings: There is cortical atrophy, unchanged.  Small, old right frontal subdural hematoma is unchanged.  There is no new hemorrhage.  Chronic microvascular ischemic change is noted.  No evidence of acute infarct, mass, midline shift or hydrocephalus. The patient is  status post right mastoidectomy.  The calvarium is intact.  IMPRESSION: No acute finding.  Stable compared prior exam.  CT CERVICAL SPINE  Findings: No fracture or subluxation of the cervical spine is identified.  There is multilevel degenerative disease.  Lung apices demonstrate emphysema.  IMPRESSION: No acute finding.  Original Report Authenticated By: Bernadene Bell. D'ALESSIO, M.D.   Dg Shoulder Left  03/03/2012  *RADIOLOGY REPORT*  Clinical Data: Fall, pain.  LEFT SHOULDER - 2+ VIEW  Comparison: None.  Findings: The distal clavicle is elevated relative to the acromion consistent with grade III AC joint separation.  The humerus is located and no fracture is identified.  Imaged left lung and ribs appear normal.  IMPRESSION: Grade III AC joint separation.  Original Report Authenticated By: Bernadene Bell. D'ALESSIO, M.D.   Dg Knee Complete 4 Views Left  03/03/2012  *RADIOLOGY REPORT*  Clinical Data: Pain.  LEFT KNEE - COMPLETE 4+ VIEW  Comparison: Plain films 05/23/2011.  Findings: Total knee arthroplasty device is in place.  Hardware is intact without evidence of loosening.  There is no fracture.  Soft tissues about the knee appear swollen.  IMPRESSION: Soft tissue swelling without underlying acute bony or joint abnormality.  Total knee replacement noted.  Original Report Authenticated By: Bernadene Bell. D'ALESSIO, M.D.     1. Acromioclavicular joint separation, type 3  MDM  This patient has seen [name] of orthopedics in the past.  L arm placed in a foam sling, and patient discharged with Percocet for pain.  Will call Dr. Diamantina Providence office Monday morning for follow up.  Lollie Sails, MD 03/04/12 [phone]

## 2012-03-04 NOTE — ED Provider Notes (Signed)
I saw and evaluated the patient, reviewed the resident's note and I agree with the findings and plan.  Pt is awake, alert, head trauma, neg films of head and neck, AC separation of shoulder, will place in sling and can follow up with his orthopedist.  Distal neurovascual intact.    Gavin Pound. Jadira Nierman, MD 03/04/12 1554

## 2012-04-11 ENCOUNTER — Other Ambulatory Visit: Payer: Self-pay | Admitting: Neurosurgery

## 2012-04-11 ENCOUNTER — Ambulatory Visit
Admission: RE | Admit: 2012-04-11 | Discharge: 2012-04-11 | Disposition: A | Discharge status: Home / Self Care | Payer: Medicare Other | Source: Ambulatory Visit | Attending: Neurosurgery | Admitting: Neurosurgery

## 2012-04-11 DIAGNOSIS — R42 Dizziness and giddiness: Secondary | ICD-10-CM

## 2012-04-11 DIAGNOSIS — R2689 Other abnormalities of gait and mobility: Secondary | ICD-10-CM

## 2012-04-22 ENCOUNTER — Encounter: Payer: Self-pay | Admitting: Adult Health

## 2012-04-22 ENCOUNTER — Ambulatory Visit (INDEPENDENT_AMBULATORY_CARE_PROVIDER_SITE_OTHER): Payer: Medicare Other | Admitting: Adult Health

## 2012-04-22 ENCOUNTER — Ambulatory Visit (INDEPENDENT_AMBULATORY_CARE_PROVIDER_SITE_OTHER)
Admission: RE | Admit: 2012-04-22 | Discharge: 2012-04-22 | Disposition: A | Discharge status: Home / Self Care | Payer: Medicare Other | Source: Ambulatory Visit | Attending: Adult Health | Admitting: Adult Health

## 2012-04-22 VITALS — BP 134/62 | HR 68 | Temp 97.0°F | Ht 69.0 in | Wt 169.4 lb

## 2012-04-22 DIAGNOSIS — J449 Chronic obstructive pulmonary disease, unspecified: Secondary | ICD-10-CM

## 2012-04-22 MED ORDER — PREDNISONE 10 MG PO TABS: ORAL_TABLET | ORAL | Status: DC

## 2012-04-22 MED ORDER — HYDROCODONE-HOMATROPINE 5-1.5 MG/5ML PO SYRP: 5.0000 mL | ORAL_SOLUTION | Freq: Four times a day (QID) | ORAL | Status: DC | PRN

## 2012-04-22 NOTE — Patient Instructions (Addendum)
Prednisone taper over next week.  Delsym 2 tsp Twice daily  For cough .  Zyrtec At bedtime for 2 weeks  Prilosec 20mg  daily for 4 weeks  Hydromet 1/2 tsp every 6 hr As needed  Cough- may make you sleepy  Please contact office for sooner follow up if symptoms do not improve or worsen or seek emergency care  follow up Dr. Delford Field  In 4 weeks

## 2012-04-22 NOTE — Assessment & Plan Note (Signed)
Flare w/ cyclical cough -?GERD /AR  CXR pending   Plan Prednisone taper over next week.  Delsym 2 tsp Twice daily  For cough .  Zyrtec At bedtime for 2 weeks  Prilosec 20mg  daily for 4 weeks  Hydromet 1/2 tsp every 6 hr As needed  Cough- may make you sleepy  Please contact office for sooner follow up if symptoms do not improve or worsen or seek emergency care  follow up Dr. Delford Field  In 4 weeks

## 2012-04-22 NOTE — Progress Notes (Signed)
  Subjective:    Patient ID: Micheal Frank, male    DOB: 1926-04-28, 78 y.o.   MRN: [id number]  HPI  Pulmonary OV  This is an 78 y.o. male with Golds III COPD primary emphysema DLCO 69% FeV1 94% 10/09  This pt has been with [facility] MD in the past. Dx COPD for 20 yrs. Last pfts 11/08 at [facility]:  DLCO 58%. also OSA on CPAP  Prolonged and multiple hospitalizations in 2012.  Infected L TKR 5/12 with Staph aureus. Removed Knee in 8/12,  Complications of epidural abscess, SDH spontaneous from coumadin (6/12), Infected L TKR explanted and finally new TKR 11/12.   DId not have to drain the hematoma in the brain.  Did drain the abscess in the back.  01/25/2012 Pt has seen his PCP since 5/13 ov and rx claritin. This seemed to help.  Now cough is present but is left.  Pt fell over a trailer hitch and hurt the back two weeks ago.  Pt is worse with a deep breath.  No xrays yet done.  Pt notes is worse with a deep breath.  Dyspnea is at baseline. >>no changes   04/22/2012 Acute OV  Complains of cough occasionally producing clear mucus, tends to be worse at night for 5 month  Reports the PND has improved.  Wife has moved out of [place] due to cough  Cough is worse at night  No GERD . Drainage is better.  No discolored mucus.  Last CXR 2012 .  No orthopnea.  No longer wears CPAP due to massive weight loss during critical illness 2012 .  Using OTC meds without help.  Feels okay but cant stop coughing    Review of Systems Constitutional:   No  weight loss, night sweats,  Fevers, chills, fatigue, lassitude. HEENT:   No headaches,  Difficulty swallowing,  Tooth/dental problems,  Sore throat,                No sneezing, itching, ear ache,  +nasal congestion, post nasal drip,   CV:  No chest pain,  Orthopnea, PND, swelling in lower extremities, anasarca, dizziness, palpitations  GI  No heartburn, indigestion, abdominal pain, nausea, vomiting, diarrhea, change in bowel habits, loss of  appetite  Resp: no wheezing.  No chest wall deformity  Skin: no rash or lesions.  GU: no dysuria, change in color of urine, no urgency or frequency.  No flank pain.  MS:  No joint pain or swelling.  No decreased range of motion.  No back pain.  Psych:  No change in mood or affect. No depression or anxiety.  No memory loss.     Objective:   Physical Exam  Filed Vitals:   04/22/12 1511  BP: 134/62  Pulse: 68  Temp: 97 F (36.1 C)  TempSrc: Oral  Height: 5\' 9"  (1.753 m)  Weight: 169 lb 6.4 oz (76.839 kg)  SpO2: 96%    Gen: Pleasant, well-nourished, in no distress,  normal affect  ENT: No lesions,  mouth clear,  oropharynx clear, no postnasal drip  Neck: No JVD, no TMG, no carotid bruits  Lungs: coarse BS , no wheezing   Cardiovascular: RRR, heart sounds normal, no murmur or gallops, no peripheral edema  Abdomen: soft and NT, no HSM,  BS normal  Musculoskeletal: No deformities, no cyanosis or clubbing  Neuro: alert, non focal  Skin: Warm, no lesions or rashes        Assessment & Plan:

## 2012-04-24 NOTE — Progress Notes (Signed)
Quick Note:  Called spoke with patient, advised of cxr results / recs as stated by TP. Pt verbalized his understanding and denied any questions. Pt did report that has fell recently but has followed up w/ his PCP regarding this and was aware of the fractures. ______

## 2012-04-30 ENCOUNTER — Other Ambulatory Visit: Payer: Self-pay | Admitting: Adult Health

## 2012-05-16 ENCOUNTER — Other Ambulatory Visit: Payer: Self-pay | Admitting: Adult Health

## 2012-05-20 ENCOUNTER — Ambulatory Visit (INDEPENDENT_AMBULATORY_CARE_PROVIDER_SITE_OTHER): Payer: Medicare Other | Admitting: Critical Care Medicine

## 2012-05-20 ENCOUNTER — Encounter: Payer: Self-pay | Admitting: Critical Care Medicine

## 2012-05-20 VITALS — BP 142/74 | HR 78 | Temp 97.8°F | Ht 69.5 in | Wt 162.0 lb

## 2012-05-20 DIAGNOSIS — R059 Cough, unspecified: Secondary | ICD-10-CM

## 2012-05-20 DIAGNOSIS — Z23 Encounter for immunization: Secondary | ICD-10-CM

## 2012-05-20 DIAGNOSIS — R05 Cough: Secondary | ICD-10-CM

## 2012-05-20 MED ORDER — HYDROCODONE-HOMATROPINE 5-1.5 MG/5ML PO SYRP: ORAL_SOLUTION | ORAL | Status: DC

## 2012-05-20 MED ORDER — CHLORPHENIRAMINE TANNATE 12 MG PO TABS: 12.0000 mg | ORAL_TABLET | Freq: Every day | ORAL | Status: DC

## 2012-05-20 MED ORDER — BENZONATATE 200 MG PO CAPS: ORAL_CAPSULE | ORAL | Status: DC

## 2012-05-20 NOTE — Progress Notes (Signed)
Subjective:    Patient ID: Micheal Frank, male    DOB: 1925-12-08, 77 y.o.   MRN: [id number]  HPI  Pulmonary OV  This is an 78 y.o. male with Golds III COPD primary emphysema DLCO 69% FeV1 94% 10/09  This pt has been with [facility] MD in the past. Dx COPD for 20 yrs. Last pfts 11/08 at [facility]:  DLCO 58%. also OSA on CPAP  Prolonged and multiple hospitalizations in 2012.  Infected L TKR 5/12 with Staph aureus. Removed Knee in 8/12,  Complications of epidural abscess, SDH spontaneous from coumadin (6/12), Infected L TKR explanted and finally new TKR 11/12.   DId not have to drain the hematoma in the brain.  Did drain the abscess in the back.  01/25/2012 Pt has seen his PCP since 5/13 ov and rx claritin. This seemed to help.  Now cough is present but is left.  Pt fell over a trailer hitch and hurt the back two weeks ago.  Pt is worse with a deep breath.  No xrays yet done.  Pt notes is worse with a deep breath.  Dyspnea is at baseline. >>no changes   04/22/12  Acute OV  Complains of cough occasionally producing clear mucus, tends to be worse at night for 5 month  Reports the PND has improved.  Wife has moved out of [place] due to cough  Cough is worse at night  No GERD . Drainage is better.  No discolored mucus.  Last CXR 2012 .  No orthopnea.  No longer wears CPAP due to massive weight loss during critical illness 2012 .  Using OTC meds without help.  Feels okay but cant stop coughing    05/20/2012 Pt seen by NP 04/22/12 and Rx pred pulse. CXR was neg.  No real change with prednisone. Pt is still coughing.  No real mucus.  Pt notes sl dry hacky cough.   Pt rx zyrtec/hydromet/delsym/ omeprazole. None of this helped the cough. Pt denies post nasal.  Pt at first was in throat.  Not in chest.  No dysphagia.  No sore throat. No scratchy throat.  Pt notes dyspnea is the same.  No wheeze.       Review of Systems Constitutional:   No  weight loss, night sweats,  Fevers, chills,  fatigue, lassitude. HEENT:   No headaches,  Difficulty swallowing,  Tooth/dental problems,  Sore throat,                No sneezing, itching, ear ache,  +nasal congestion, post nasal drip,   CV:  No chest pain,  Orthopnea, PND, swelling in lower extremities, anasarca, dizziness, palpitations  GI  No heartburn, indigestion, abdominal pain, nausea, vomiting, diarrhea, change in bowel habits, loss of appetite  Resp: no wheezing.  No chest wall deformity. Notes dry hacky cough  Skin: no rash or lesions.  GU: no dysuria, change in color of urine, no urgency or frequency.  No flank pain.  MS:  No joint pain or swelling.  No decreased range of motion.  No back pain.  Psych:  No change in mood or affect. No depression or anxiety.  No memory loss.     Objective:   Physical Exam  Filed Vitals:   05/20/12 1055  Pulse: 78  Temp: 97.8 F (36.6 C)  TempSrc: Oral  Height: 5' 9.5" (1.765 m)  Weight: 162 lb (73.483 kg)  SpO2: 96%    Gen: Pleasant, well-nourished, in no distress,  normal  affect  ENT: No lesions,  mouth clear,  oropharynx clear, no postnasal drip  Neck: No JVD, no TMG, no carotid bruits  Lungs: coarse BS , no wheezing   Cardiovascular: RRR, heart sounds normal, no murmur or gallops, no peripheral edema  Abdomen: soft and NT, no HSM,  BS normal  Musculoskeletal: No deformities, no cyanosis or clubbing  Neuro: alert, non focal  Skin: Warm, no lesions or rashes        Assessment & Plan:   Cough Cyclical cough on the basis of upper airway instability and postnasal drip syndrome with no evidence of active or acute lung disease at this time  Plan Stop zyrtec, delsym, omeprazole Start Cyclic cough protocol with Hycodan and tessalon Keep sugar free candy drop in mouth at all times, train self to swallow not clear throat or cough Start Chlorpheniramine 12mg  at bedtime No other medication changes Flu vaccine was given today 05/20/2012 Return 2  months    Updated Medication List Outpatient Encounter Prescriptions as of 05/20/2012  Medication Sig Dispense Refill  . albuterol-ipratropium (COMBIVENT) 18-103 MCG/ACT inhaler Inhale 2 puffs into the lungs as needed.      Marland Kitchen buPROPion (WELLBUTRIN SR) 100 MG 12 hr tablet Take 200 mg by mouth 2 (two) times daily.       . calcium carbonate (OS-CAL) 600 MG TABS Take 600 mg by mouth daily.       . diazepam (VALIUM) 5 MG tablet Take 5 mg by mouth every 8 (eight) hours as needed. For anxiety      . doxazosin (CARDURA) 4 MG tablet Take 1 tablet by mouth Twice daily.      . Fluticasone-Salmeterol (ADVAIR) 250-50 MCG/DOSE AEPB Inhale 1 puff into the lungs 2 (two) times daily.      Marland Kitchen guaiFENesin (MUCINEX) 600 MG 12 hr tablet Take 600 mg by mouth at bedtime.      Marland Kitchen HYDROcodone-acetaminophen (NORCO/VICODIN) 5-325 MG per tablet Take 1 tablet by mouth every 6 (six) hours as needed. For pain      . HYDROcodone-homatropine (HYCODAN) 5-1.5 MG/5ML syrup 1 tsp by mouth every 6 hours as needed for cough per protocol  240 mL  0  . meclizine (ANTIVERT) 25 MG tablet Take 25 mg by mouth 4 (four) times daily as needed. For vertigo      . Multiple Vitamins-Minerals (MULTIVITAL PO) Take 1 tablet by mouth daily.      Marland Kitchen oxyCODONE (OXY IR/ROXICODONE) 5 MG immediate release tablet Take 1-2 tablets by mouth. 1-2 every 4 hours as needed      . rOPINIRole (REQUIP) 1 MG tablet Take 1 mg by mouth at bedtime.       . simvastatin (ZOCOR) 40 MG tablet Take 40 mg by mouth at bedtime.       . triamterene (DYRENIUM) 50 MG capsule 1/2 tab by mouth once daily      . [DISCONTINUED] HYDROcodone-homatropine (HYCODAN) 5-1.5 MG/5ML syrup 1/2-1 tsp by mouth every 6 hours as needed for cough  240 mL  0  . benzonatate (TESSALON) 200 MG capsule One 3-4 times daily as needed for cough  60 capsule  4  . Chlorpheniramine Tannate 12 MG TABS Take 1 tablet (12 mg total) by mouth at bedtime.  30 each  4  . [DISCONTINUED] Beclomethasone Dipropionate 80  MCG/ACT AERS Place 2 puffs into the nose daily.      . [DISCONTINUED] predniSONE (DELTASONE) 10 MG tablet 4 tabs for 2 days, then 3 tabs for 2  days, 2 tabs for 2 days, then 1 tab for 2 days, then stop  20 tablet  0

## 2012-05-20 NOTE — Patient Instructions (Addendum)
Stop zyrtec, delsym, omeprazole Start Cyclic cough protocol with Hycodan and tessalon Keep sugar free candy drop in mouth at all times, train self to swallow not clear throat or cough Start Chlorpheniramine 12mg  at bedtime No other medication changes Flu vaccine was given today 05/20/2012 Return 2 months

## 2012-05-20 NOTE — Assessment & Plan Note (Signed)
Cyclical cough on the basis of upper airway instability and postnasal drip syndrome with no evidence of active or acute lung disease at this time  Plan Stop zyrtec, delsym, omeprazole Start Cyclic cough protocol with Hycodan and tessalon Keep sugar free candy drop in mouth at all times, train self to swallow not clear throat or cough Start Chlorpheniramine 12mg  at bedtime No other medication changes Flu vaccine was given today 05/20/2012 Return 2 months

## 2012-05-31 ENCOUNTER — Telehealth: Payer: Self-pay | Admitting: Critical Care Medicine

## 2012-05-31 NOTE — Telephone Encounter (Signed)
Called [facility], spoke with [facility], pharmacist.  Advised early refill on benzonatate ok by PW.  He verbalized understanding.  Pt aware and will inform daughter.

## 2012-05-31 NOTE — Telephone Encounter (Signed)
Called, spoke with pt. States he lost the bottle for the benzonatate and requesting rx.    Rx sent on 05/20/12 # 60 x 4.  Called [facility], spoke with [place].  They do have this rx.  Spoke with pt and advised rxs at pharm.  He verbalized understanding and would like Dr. Delford Field to know cough has improved with the cough protocol.  Will sign off and route to PW as FYI.

## 2012-05-31 NOTE — Telephone Encounter (Signed)
Called and spoke with pts daughter and she stated that the pt lost his rx bottle and when they tried to refill this medication the pharmacy will not refill without approval from PW.  This medication is not covered by his insurance.  Please advise if ok to give early refill this time.  Thanks No Known Allergies

## 2012-05-31 NOTE — Telephone Encounter (Signed)
Ok to do so.  BTW  i have responded to this once already today

## 2012-05-31 NOTE — Telephone Encounter (Signed)
Excellent.  Thank you 

## 2012-05-31 NOTE — Telephone Encounter (Signed)
Pt's daughter would like to know if a new Rx is going to be called in today or not.  Micheal Frank

## 2012-05-31 NOTE — Telephone Encounter (Signed)
Pt called back again- daughter is at pharmacy now- wating. Micheal Frank

## 2012-07-22 ENCOUNTER — Encounter: Payer: Self-pay | Admitting: Adult Health

## 2012-07-22 ENCOUNTER — Telehealth: Payer: Self-pay | Admitting: Critical Care Medicine

## 2012-07-22 ENCOUNTER — Ambulatory Visit (INDEPENDENT_AMBULATORY_CARE_PROVIDER_SITE_OTHER): Payer: Medicare Other | Admitting: Adult Health

## 2012-07-22 VITALS — BP 130/74 | HR 80 | Temp 96.7°F | Ht 69.5 in | Wt 165.0 lb

## 2012-07-22 DIAGNOSIS — J449 Chronic obstructive pulmonary disease, unspecified: Secondary | ICD-10-CM

## 2012-07-22 DIAGNOSIS — J4489 Other specified chronic obstructive pulmonary disease: Secondary | ICD-10-CM

## 2012-07-22 MED ORDER — AMOXICILLIN-POT CLAVULANATE 875-125 MG PO TABS: 1.0000 | ORAL_TABLET | Freq: Two times a day (BID) | ORAL | Status: DC

## 2012-07-22 NOTE — Telephone Encounter (Signed)
OV with TP scheduled for today at 2:45pm.  Pt aware to call or seek emergency care if his breathing worsens prior to ov.  Pt declined AM appt as he still needs "to get ready" and does not live in [place] > pt aware this appt is at the [facility] location.  Nothing further needed at this time; will sign off.

## 2012-07-22 NOTE — Patient Instructions (Addendum)
Augmentin 875mg  Twice daily  For 7 days   Mucinex DM Twice daily  As needed  Cough/congesiton  Please contact office for sooner follow up if symptoms do not improve or worsen or seek emergency care  follow up Dr. Delford Field  In 2 weeks as planned and As needed

## 2012-07-22 NOTE — Telephone Encounter (Signed)
Pt's daughter Larita Fife says pt needs to be seen today- coughing up lots of phlegm / afraid he may get pna - call Larita Fife @ [phone]. Hazel Sams

## 2012-07-22 NOTE — Telephone Encounter (Signed)
Called, spoke with pt's daughter, Larita Fife, who is requesting pt to be seen today for a cough with a lot of phelgm.  Unsure of color of phelgm and any other symptoms pt may be having.  Requesting I call and speak with pt.  Daughter concerns about pt's symptoms d/t his hx.  Called, spoke with pt who reports since Thursday his cough has gotten worse -- is deeper and coughing up a lot of clear mucus.  Pt denied the mucus having any color, denies increased SOB, wheezing, chest tightness, chest pain, f/c/s, or body aches.  States he is taking theChlorpheniramine qhs.  He is requesting to be seen today -- prefers something after lunch.  Tammy, pls advise if we can work pt in today.  Thank you.

## 2012-07-26 ENCOUNTER — Telehealth: Payer: Self-pay | Admitting: Critical Care Medicine

## 2012-07-26 NOTE — Telephone Encounter (Signed)
I spoke with pt. Aware of MW recs. He voiced his understanding. Needed nothing further. Will forward to Dr. Delford Field as Lorain Childes pt called.

## 2012-07-26 NOTE — Assessment & Plan Note (Signed)
Exacerbation   Plan  Augmentin 875mg  Twice daily  For 7 days   Mucinex DM Twice daily  As needed  Cough/congesiton  Please contact office for sooner follow up if symptoms do not improve or worsen or seek emergency care  follow up Dr. Delford Field  In 2 weeks as planned and As needed

## 2012-07-26 NOTE — Telephone Encounter (Signed)
Pt was seen on 07/22/2012. Called pt back and he stated that he is still on the abx, mucinex, perles and he is feeling better but the cough is much deeper now and he is now getting up a small amount of yellow sputum.  He wanted to see if anything else needed to be done.  Pt is aware that PW is not in the office today but will forward to MW for any further recs.  MW please advise thanks  Allergies  Allergen Reactions  . Anticoagulant Compound     History of bleeding on the brain per daughter  \

## 2012-07-26 NOTE — Telephone Encounter (Signed)
No change rx needed  

## 2012-07-26 NOTE — Progress Notes (Signed)
Subjective:    Patient ID: Micheal Frank, male    DOB: Feb 16, 1926, 78 y.o.   MRN: [id number]  HPI Pulmonary OV  This is an 79 y.o. male with Golds III COPD primary emphysema DLCO 69% FeV1 94% 10/09  This pt has been with [facility] MD in the past. Dx COPD for 20 yrs. Last pfts 11/08 at [facility]:  DLCO 58%. also OSA on CPAP  Prolonged and multiple hospitalizations in 2012.  Infected L TKR 5/12 with Staph aureus. Removed Knee in 8/12,  Complications of epidural abscess, SDH spontaneous from coumadin (6/12), Infected L TKR explanted and finally new TKR 11/12.   DId not have to drain the hematoma in the brain.  Did drain the abscess in the back.     07/22/12 Acute OV  Complains of head congestion/HA, yellow nasal drainage, PND/hoarseness, deeper cough with clear/murky mucus, increased DOE x4days.  Mucinex not helping.  Patient denies any hemoptysis, orthopnea, PND, or leg swelling. He remains on Advair twice daily.      Review of SystemsConstitutional:   No  weight loss, night sweats,  Fevers, chills, fatigue, lassitude. HEENT:   No headaches,  Difficulty swallowing,  Tooth/dental problems,  Sore throat,                No sneezing, itching, ear ache,  +nasal congestion, post nasal drip,   CV:  No chest pain,  Orthopnea, PND, swelling in lower extremities, anasarca, dizziness, palpitations  GI  No heartburn, indigestion, abdominal pain, nausea, vomiting, diarrhea, change in bowel habits, loss of appetite  Resp: no wheezing.  No chest wall deformity. Notes dry hacky cough  Skin: no rash or lesions.  GU: no dysuria, change in color of urine, no urgency or frequency.  No flank pain.  MS:  No joint pain or swelling.  No decreased range of motion.  No back pain.  Psych:  No change in mood or affect. No depression or anxiety.  No memory loss.     Objective:   Physical Exam Filed Vitals:   07/22/12 1508  BP: 130/74  Pulse: 80  Temp: 96.7 F (35.9 C)  TempSrc: Oral  Height:  5' 9.5" (1.765 m)  Weight: 165 lb (74.844 kg)  SpO2: 93%    Gen: Pleasant, well-nourished, in no distress,  normal affect  ENT: No lesions,  mouth clear,  oropharynx clear, no postnasal drip  Neck: No JVD, no TMG, no carotid bruits  Lungs: coarse rhonchi with  no wheezing   Cardiovascular: RRR, heart sounds normal, no murmur or gallops, no peripheral edema  Abdomen: soft and NT, no HSM,  BS normal  Musculoskeletal: No deformities, no cyanosis or clubbing  Neuro: alert, non focal  Skin: Warm, no lesions or rashes        Assessment & Plan:   No problem-specific assessment & plan notes found for this encounter.   Updated Medication List Outpatient Encounter Prescriptions as of 07/22/2012  Medication Sig Dispense Refill  . albuterol-ipratropium (COMBIVENT) 18-103 MCG/ACT inhaler Inhale 2 puffs into the lungs as needed.      . benzonatate (TESSALON) 200 MG capsule One 3-4 times daily as needed for cough  60 capsule  4  . buPROPion (WELLBUTRIN SR) 100 MG 12 hr tablet Take 200 mg by mouth 2 (two) times daily.       . calcium carbonate (OS-CAL) 600 MG TABS Take 600 mg by mouth daily.       . Chlorpheniramine Tannate 12 MG  TABS Take 1 tablet (12 mg total) by mouth at bedtime.  30 each  4  . Dextromethorphan-Guaifenesin (TUSSIN DM) 10-100 MG/5ML liquid Take 5 mLs by mouth every 4 (four) hours as needed.      . diazepam (VALIUM) 5 MG tablet Take 5 mg by mouth every 8 (eight) hours as needed. For anxiety      . doxazosin (CARDURA) 4 MG tablet Take 1 tablet by mouth Twice daily.      . Fluticasone-Salmeterol (ADVAIR) 250-50 MCG/DOSE AEPB Inhale 1 puff into the lungs 2 (two) times daily.      Marland Kitchen guaiFENesin (MUCINEX) 600 MG 12 hr tablet Take 600 mg by mouth at bedtime.      Marland Kitchen HYDROcodone-acetaminophen (NORCO/VICODIN) 5-325 MG per tablet Take 1 tablet by mouth every 6 (six) hours as needed. For pain      . meclizine (ANTIVERT) 25 MG tablet Take 25 mg by mouth 4 (four) times daily as  needed. For vertigo      . Multiple Vitamins-Minerals (MULTIVITAL PO) Take 1 tablet by mouth daily.      Marland Kitchen oxyCODONE (OXY IR/ROXICODONE) 5 MG immediate release tablet Take 1-2 tablets by mouth. 1-2 every 4 hours as needed      . rOPINIRole (REQUIP) 1 MG tablet Take 1 mg by mouth at bedtime.       . simvastatin (ZOCOR) 40 MG tablet Take 40 mg by mouth at bedtime.       . triamterene (DYRENIUM) 50 MG capsule 1/2 tab by mouth once daily      . amoxicillin-clavulanate (AUGMENTIN) 875-125 MG per tablet Take 1 tablet by mouth 2 (two) times daily.  14 tablet  0  . HYDROcodone-homatropine (HYCODAN) 5-1.5 MG/5ML syrup 1 tsp by mouth every 6 hours as needed for cough per protocol  240 mL  0

## 2012-07-26 NOTE — Telephone Encounter (Signed)
i agree. 

## 2012-07-30 ENCOUNTER — Telehealth: Payer: Self-pay | Admitting: Critical Care Medicine

## 2012-07-30 NOTE — Telephone Encounter (Signed)
Pt states that he is better but still has an annoying hacky cough, very little mucus production. Pt aware the abx will continue to work for up to one week after taking the last pil;l and if he has refills on the Tessalon perles he can refill this. Pt is scheduled for follow-up on Thurs., 08/08/12 in the [facility] office and will keep that appt. Pt to call if his sxs do not continue to improve or get worse.

## 2012-08-08 ENCOUNTER — Encounter: Payer: Self-pay | Admitting: Critical Care Medicine

## 2012-08-08 ENCOUNTER — Ambulatory Visit (INDEPENDENT_AMBULATORY_CARE_PROVIDER_SITE_OTHER): Payer: Medicare Other | Admitting: Critical Care Medicine

## 2012-08-08 VITALS — BP 114/72 | HR 85 | Temp 98.1°F | Ht 69.5 in | Wt 166.0 lb

## 2012-08-08 DIAGNOSIS — J441 Chronic obstructive pulmonary disease with (acute) exacerbation: Secondary | ICD-10-CM

## 2012-08-08 DIAGNOSIS — R059 Cough, unspecified: Secondary | ICD-10-CM

## 2012-08-08 DIAGNOSIS — R05 Cough: Secondary | ICD-10-CM

## 2012-08-08 MED ORDER — PREDNISONE 10 MG PO TABS: ORAL_TABLET | ORAL | Status: DC

## 2012-08-08 MED ORDER — FLUTICASONE PROPIONATE 50 MCG/ACT NA SUSP: 2.0000 | Freq: Two times a day (BID) | NASAL | Status: DC

## 2012-08-08 MED ORDER — MOMETASONE FURO-FORMOTEROL FUM 200-5 MCG/ACT IN AERO: 2.0000 | INHALATION_SPRAY | Freq: Two times a day (BID) | RESPIRATORY_TRACT | Status: DC

## 2012-08-08 MED ORDER — AMOXICILLIN-POT CLAVULANATE 875-125 MG PO TABS: 1.0000 | ORAL_TABLET | Freq: Two times a day (BID) | ORAL | Status: DC

## 2012-08-08 NOTE — Progress Notes (Signed)
Subjective:    Patient ID: Micheal Frank, male    DOB: 19-Jul-1925, 79 y.o.   MRN: [id number]  HPI  Pulmonary OV  This is an 79 y.o. male with Golds III COPD primary emphysema DLCO 69% FeV1 94% 10/09  This pt has been with [facility] MD in the past. Dx COPD for 20 yrs. Last pfts 11/08 at [facility]:  DLCO 58%. also OSA on CPAP  Prolonged and multiple hospitalizations in 2012.  Infected L TKR 5/12 with Staph aureus. Removed Knee in 8/12,  Complications of epidural abscess, SDH spontaneous from coumadin (6/12), Infected L TKR explanted and finally new TKR 11/12.   DId not have to drain the hematoma in the brain.  Did drain the abscess in the back.     07/22/12 Acute OV  Complains of head congestion/HA, yellow nasal drainage, PND/hoarseness, deeper cough with clear/murky mucus, increased DOE x4days.  Mucinex not helping.  Patient denies any hemoptysis, orthopnea, PND, or leg swelling. He remains on Advair twice daily.  08/08/2012 COugh remains but better vs 07/22/12.  Cough is deep, no dark mucus. Sl tint.  No real dyspnea unless bending over to put on shoes.  No chest pain.  No f/c/s.  No heartburn.  SOme pndrip. More nasal discharge, same color.  No sinus h/a.        Review of Systems Constitutional:   No  weight loss, night sweats,  Fevers, chills, fatigue, lassitude. HEENT:   No headaches,  Difficulty swallowing,  Tooth/dental problems,  Sore throat,                No sneezing, itching, ear ache,  +nasal congestion, post nasal drip,   CV:  No chest pain,  Orthopnea, PND, swelling in lower extremities, anasarca, dizziness, palpitations  GI  No heartburn, indigestion, abdominal pain, nausea, vomiting, diarrhea, change in bowel habits, loss of appetite  Resp: no wheezing.  No chest wall deformity. Notes dry hacky cough  Skin: no rash or lesions.  GU: no dysuria, change in color of urine, no urgency or frequency.  No flank pain.  MS:  No joint pain or swelling.  No decreased range  of motion.  No back pain.  Psych:  No change in mood or affect. No depression or anxiety.  No memory loss.     Objective:   Physical Exam  Filed Vitals:   08/08/12 1005  BP: 114/72  Pulse: 85  Temp: 98.1 F (36.7 C)  TempSrc: Oral  Height: 5' 9.5" (1.765 m)  Weight: 166 lb (75.297 kg)  SpO2: 95%    Gen: Pleasant, well-nourished, in no distress,  normal affect  ENT: No lesions,  mouth clear,  oropharynx clear, +++ postnasal drip, purulent nasal secretions  Neck: No JVD, no TMG, no carotid bruits  Lungs: coarse rhonchi with  no wheezing   Cardiovascular: RRR, heart sounds normal, no murmur or gallops, no peripheral edema  Abdomen: soft and NT, no HSM,  BS normal  Musculoskeletal: No deformities, no cyanosis or clubbing  Neuro: alert, non focal  Skin: Warm, no lesions or rashes        Assessment & Plan:   Cough Cyclical cough do to chronic obstructive lung disease with asthmatic bronchitic component and chronic recurrent sinusitis Plan Dulera two puff twice daily (start when advair runs out) STop Advair when current inhaler runs out Prednisone 10mg  Take 4 for two days three for two days two for two days one for two days Augmentin twice  daily for 10days Start flonase two puff twice daily Return 1 month    Updated Medication List Outpatient Encounter Prescriptions as of 08/08/2012  Medication Sig Dispense Refill  . albuterol-ipratropium (COMBIVENT) 18-103 MCG/ACT inhaler Inhale 2 puffs into the lungs as needed.      . benzonatate (TESSALON) 200 MG capsule One 3-4 times daily as needed for cough  60 capsule  4  . buPROPion (WELLBUTRIN SR) 100 MG 12 hr tablet Take 200 mg by mouth 2 (two) times daily.       . calcium carbonate (OS-CAL) 600 MG TABS Take 600 mg by mouth daily.       . Chlorpheniramine Tannate 12 MG TABS Take 1 tablet (12 mg total) by mouth at bedtime.  30 each  4  . Dextromethorphan-Guaifenesin (TUSSIN DM) 10-100 MG/5ML liquid Take 5 mLs by mouth  every 4 (four) hours as needed.      . diazepam (VALIUM) 5 MG tablet Take 5 mg by mouth every 8 (eight) hours as needed. For anxiety      . doxazosin (CARDURA) 4 MG tablet Take 1 tablet by mouth Twice daily.      Marland Kitchen guaiFENesin (MUCINEX) 600 MG 12 hr tablet Take 600 mg by mouth 2 (two) times daily.       Marland Kitchen HYDROcodone-acetaminophen (NORCO/VICODIN) 5-325 MG per tablet Take 1 tablet by mouth every 6 (six) hours as needed. For pain      . meclizine (ANTIVERT) 25 MG tablet Take 25 mg by mouth 4 (four) times daily as needed. For vertigo      . Multiple Vitamins-Minerals (MULTIVITAL PO) Take 1 tablet by mouth daily.      Marland Kitchen oxyCODONE (OXY IR/ROXICODONE) 5 MG immediate release tablet Take 1-2 tablets by mouth. 1-2 every 4 hours as needed      . rOPINIRole (REQUIP) 1 MG tablet Take 1 mg by mouth at bedtime.       . simvastatin (ZOCOR) 40 MG tablet Take 40 mg by mouth at bedtime.       . triamterene (DYRENIUM) 50 MG capsule 1/2 tab by mouth once daily      . [DISCONTINUED] Fluticasone-Salmeterol (ADVAIR) 250-50 MCG/DOSE AEPB Inhale 1 puff into the lungs 2 (two) times daily.      Marland Kitchen amoxicillin-clavulanate (AUGMENTIN) 875-125 MG per tablet Take 1 tablet by mouth 2 (two) times daily.  14 tablet  0  . fluticasone (FLONASE) 50 MCG/ACT nasal spray Place 2 sprays into the nose 2 (two) times daily.  16 g  6  . HYDROcodone-homatropine (HYCODAN) 5-1.5 MG/5ML syrup 1 tsp by mouth every 6 hours as needed for cough per protocol  240 mL  0  . mometasone-formoterol (DULERA) 200-5 MCG/ACT AERO Inhale 2 puffs into the lungs 2 (two) times daily.  1 Inhaler  11  . predniSONE (DELTASONE) 10 MG tablet Take 4 for two days three for two days two for two days one for two days  20 tablet  0  . [DISCONTINUED] amoxicillin-clavulanate (AUGMENTIN) 875-125 MG per tablet Take 1 tablet by mouth 2 (two) times daily.  14 tablet  0

## 2012-08-08 NOTE — Assessment & Plan Note (Addendum)
Cyclical cough do to chronic obstructive lung disease with asthmatic bronchitic component and chronic recurrent sinusitis Plan Dulera two puff twice daily (start when advair runs out) STop Advair when current inhaler runs out Prednisone 10mg  Take 4 for two days three for two days two for two days one for two days Augmentin twice daily for 10days Start flonase two puff twice daily Return 1 month

## 2012-08-08 NOTE — Patient Instructions (Addendum)
Dulera two puff twice daily (start when advair runs out) STop Advair when current inhaler runs out Prednisone 10mg  Take 4 for two days three for two days two for two days one for two days Augmentin twice daily for 10days Start flonase two puff twice daily Return 1 month

## 2012-08-12 ENCOUNTER — Telehealth: Payer: Self-pay | Admitting: Critical Care Medicine

## 2012-08-12 MED ORDER — HYDROCODONE-HOMATROPINE 5-1.5 MG/5ML PO SYRP: ORAL_SOLUTION | ORAL | Status: DC

## 2012-08-12 NOTE — Telephone Encounter (Signed)
Spoke with patient, had questions about flonase and the direction on how to take it.  Also requesting refill on Hycodan cough syrup, states he still has cough and would like some more.  I informed him of the flonase directions as listed below from his last OV.  Dr. Delford Field is it ok to refill hycodan cough syrup for patient? Cough syrup last refilled 05/20/12 with no refill  Last OV: 08/08/12 Patient Instructions     Dulera two puff twice daily (start when advair runs out)  STop Advair when current inhaler runs out  Prednisone 10mg  Take 4 for two days three for two days two for two days one for two days  Augmentin twice daily for 10days  Start flonase two puff twice daily  Return 1 month

## 2012-08-12 NOTE — Addendum Note (Signed)
Addended by: Orma Flaming D on: 08/12/2012 10:46 AM   Modules accepted: Orders

## 2012-08-12 NOTE — Telephone Encounter (Signed)
Spoke with patient, he is aware Rx has been sent to verified pharmacy. Nothing further needed at this time.

## 2012-08-12 NOTE — Telephone Encounter (Signed)
Msg closed by accident.

## 2012-08-12 NOTE — Telephone Encounter (Signed)
Ok to refill hycodan cough syrup 

## 2012-08-22 ENCOUNTER — Other Ambulatory Visit: Payer: Self-pay | Admitting: Critical Care Medicine

## 2012-08-22 MED ORDER — HYDROCODONE-HOMATROPINE 5-1.5 MG/5ML PO SYRP: 5.0000 mL | ORAL_SOLUTION | Freq: Four times a day (QID) | ORAL | Status: DC | PRN

## 2012-08-22 NOTE — Addendum Note (Signed)
Addended by: Caryl Ada on: 08/22/2012 12:15 PM   Modules accepted: Orders

## 2012-08-22 NOTE — Telephone Encounter (Signed)
Last seen 08/08/12 Last fill of Hydromet 08/12/12 #223mL  Would you be okay with refilling this? Please advise. Thanks!

## 2012-09-04 ENCOUNTER — Other Ambulatory Visit: Payer: Self-pay | Admitting: Dermatology

## 2012-09-09 ENCOUNTER — Encounter: Payer: Self-pay | Admitting: Critical Care Medicine

## 2012-09-09 ENCOUNTER — Ambulatory Visit (INDEPENDENT_AMBULATORY_CARE_PROVIDER_SITE_OTHER): Payer: Medicare Other | Admitting: Critical Care Medicine

## 2012-09-09 VITALS — BP 140/80 | HR 75 | Temp 97.6°F | Ht 69.5 in | Wt 165.0 lb

## 2012-09-09 DIAGNOSIS — J4489 Other specified chronic obstructive pulmonary disease: Secondary | ICD-10-CM

## 2012-09-09 DIAGNOSIS — J449 Chronic obstructive pulmonary disease, unspecified: Secondary | ICD-10-CM

## 2012-09-09 MED ORDER — ALBUTEROL SULFATE HFA 108 (90 BASE) MCG/ACT IN AERS: 2.0000 | INHALATION_SPRAY | Freq: Four times a day (QID) | RESPIRATORY_TRACT | Status: DC | PRN

## 2012-09-09 MED ORDER — FLUTICASONE-SALMETEROL 250-50 MCG/DOSE IN AEPB: 1.0000 | INHALATION_SPRAY | Freq: Two times a day (BID) | RESPIRATORY_TRACT | Status: DC

## 2012-09-09 NOTE — Assessment & Plan Note (Signed)
Copd gold C stable at this time  Plan Advair one puff twice daily Stop Dulera Stop combivent Use Ventolin or Proair as needed Return 3 months

## 2012-09-09 NOTE — Patient Instructions (Signed)
Advair one puff twice daily Stop Dulera Stop combivent Use Ventolin or Proair as needed Return 3 months

## 2012-09-09 NOTE — Progress Notes (Signed)
Subjective:    Patient ID: Micheal Frank, male    DOB: 06/25/1926, 79 y.o.   MRN: [id number]  HPI  Pulmonary OV  This is an 79 y.o. male with Golds III COPD primary emphysema DLCO 69% FeV1 94% 10/09  This pt has been with [facility] MD in the past. Dx COPD for 20 yrs. Last pfts 11/08 at [facility]:  DLCO 58%. also OSA on CPAP  Prolonged and multiple hospitalizations in 2012.  Infected L TKR 5/12 with Staph aureus. Removed Knee in 8/12,  Complications of epidural abscess, SDH spontaneous from coumadin (6/12), Infected L TKR explanted and finally new TKR 11/12.   DId not have to drain the hematoma in the brain.  Did drain the abscess in the back.     07/22/12 Acute OV  Complains of head congestion/HA, yellow nasal drainage, PND/hoarseness, deeper cough with clear/murky mucus, increased DOE x4days.  Mucinex not helping.  Patient denies any hemoptysis, orthopnea, PND, or leg swelling. He remains on Advair twice daily.  08/08/2012 COugh remains but better vs 07/22/12.  Cough is deep, no dark mucus. Sl tint.  No real dyspnea unless bending over to put on shoes.  No chest pain.  No f/c/s.  No heartburn.  SOme pndrip. More nasal discharge, same color.  No sinus h/a.    09/09/2012 Now is better and less cough.  No real wheeze.  No real chest pain.  Still issues with TKR.  NOtes some edema in the LE.   On advair.   Pt denies any significant sore throat, nasal congestion or excess secretions, fever, chills, sweats, unintended weight loss, pleurtic or exertional chest pain, orthopnea PND, or leg swelling Pt denies any increase in rescue therapy over baseline, denies waking up needing it or having any early am or nocturnal exacerbations of coughing/wheezing/or dyspnea. Pt also denies any obvious fluctuation in symptoms with  weather or environmental change or other alleviating or aggravating factors  Review of Systems Constitutional:   No  weight loss, night sweats,  Fevers, chills, fatigue,  lassitude. HEENT:   No headaches,  Difficulty swallowing,  Tooth/dental problems,  Sore throat,                No sneezing, itching, ear ache,  +nasal congestion, post nasal drip,   CV:  No chest pain,  Orthopnea, PND, swelling in lower extremities, anasarca, dizziness, palpitations  GI  No heartburn, indigestion, abdominal pain, nausea, vomiting, diarrhea, change in bowel habits, loss of appetite  Resp: no wheezing.  No chest wall deformity. Notes dry hacky cough  Skin: no rash or lesions.  GU: no dysuria, change in color of urine, no urgency or frequency.  No flank pain.  MS:  No joint pain or swelling.  No decreased range of motion.  No back pain.  Psych:  No change in mood or affect. No depression or anxiety.  No memory loss.     Objective:   Physical Exam  Filed Vitals:   09/09/12 1001  BP: 140/80  Pulse: 75  Temp: 97.6 F (36.4 C)  TempSrc: Oral  Height: 5' 9.5" (1.765 m)  Weight: 165 lb (74.844 kg)  SpO2: 92%    Gen: Pleasant, well-nourished, in no distress,  normal affect  ENT: No lesions,  mouth clear,  oropharynx clear, no postnasal drip,   Neck: No JVD, no TMG, no carotid bruits  Lungs: coarse rhonchi with  no wheezing   Cardiovascular: RRR, heart sounds normal, no murmur or gallops,  no peripheral edema  Abdomen: soft and NT, no HSM,  BS normal  Musculoskeletal: No deformities, no cyanosis or clubbing  Neuro: alert, non focal  Skin: Warm, no lesions or rashes        Assessment & Plan:   COPD gold stage C. Copd gold C stable at this time  Plan Advair one puff twice daily Stop Dulera Stop combivent Use Ventolin or Proair as needed Return 3 months     Updated Medication List Outpatient Encounter Prescriptions as of 09/09/2012  Medication Sig Dispense Refill  . buPROPion (WELLBUTRIN SR) 100 MG 12 hr tablet Take 200 mg by mouth 2 (two) times daily.       . calcium carbonate (OS-CAL) 600 MG TABS Take 600 mg by mouth daily.       .  Chlorpheniramine Tannate 12 MG TABS Take 1 tablet (12 mg total) by mouth at bedtime.  30 each  4  . Dextromethorphan-Guaifenesin (TUSSIN DM) 10-100 MG/5ML liquid Take 5 mLs by mouth every 4 (four) hours as needed.      . diazepam (VALIUM) 5 MG tablet Take 5 mg by mouth every 8 (eight) hours as needed. For anxiety      . fluticasone (FLONASE) 50 MCG/ACT nasal spray Place 2 sprays into the nose 2 (two) times daily.  16 g  6  . Fluticasone-Salmeterol (ADVAIR) 250-50 MCG/DOSE AEPB Inhale 1 puff into the lungs every 12 (twelve) hours.  60 each  6  . guaiFENesin (MUCINEX) 600 MG 12 hr tablet Take 600 mg by mouth 2 (two) times daily.       Marland Kitchen HYDROcodone-acetaminophen (NORCO/VICODIN) 5-325 MG per tablet Take 1 tablet by mouth every 6 (six) hours as needed. For pain      . HYDROcodone-homatropine (HYCODAN) 5-1.5 MG/5ML syrup Take 5 mLs by mouth every 6 (six) hours as needed for cough.  240 mL  0  . meclizine (ANTIVERT) 25 MG tablet Take 25 mg by mouth 4 (four) times daily as needed. For vertigo      . Multiple Vitamins-Minerals (MULTIVITAL PO) Take 1 tablet by mouth daily.      Marland Kitchen oxyCODONE (OXY IR/ROXICODONE) 5 MG immediate release tablet Take 1-2 tablets by mouth. 1-2 every 4 hours as needed      . rOPINIRole (REQUIP) 1 MG tablet Take 1 mg by mouth at bedtime.       . simvastatin (ZOCOR) 40 MG tablet Take 40 mg by mouth at bedtime.       . triamterene (DYRENIUM) 50 MG capsule 1/2 tab by mouth once daily      . [DISCONTINUED] Fluticasone-Salmeterol (ADVAIR) 250-50 MCG/DOSE AEPB Inhale 1 puff into the lungs every 12 (twelve) hours.      Marland Kitchen albuterol (PROAIR HFA) 108 (90 BASE) MCG/ACT inhaler Inhale 2 puffs into the lungs every 6 (six) hours as needed for wheezing.  1 Inhaler  4  . benzonatate (TESSALON) 200 MG capsule One 3-4 times daily as needed for cough  60 capsule  4  . [DISCONTINUED] albuterol-ipratropium (COMBIVENT) 18-103 MCG/ACT inhaler Inhale 2 puffs into the lungs as needed.      . [DISCONTINUED]  amoxicillin-clavulanate (AUGMENTIN) 875-125 MG per tablet Take 1 tablet by mouth 2 (two) times daily.  14 tablet  0  . [DISCONTINUED] doxazosin (CARDURA) 4 MG tablet Take 1 tablet by mouth Twice daily.      . [DISCONTINUED] mometasone-formoterol (DULERA) 200-5 MCG/ACT AERO Inhale 2 puffs into the lungs 2 (two) times daily.  1 Inhaler  11  . [DISCONTINUED] predniSONE (DELTASONE) 10 MG tablet Take 4 for two days three for two days two for two days one for two days  20 tablet  0   No facility-administered encounter medications on file as of 09/09/2012.

## 2012-09-10 ENCOUNTER — Other Ambulatory Visit: Payer: Self-pay | Admitting: Critical Care Medicine

## 2012-09-19 ENCOUNTER — Other Ambulatory Visit: Payer: Self-pay | Admitting: Critical Care Medicine

## 2012-09-30 ENCOUNTER — Other Ambulatory Visit: Payer: Self-pay | Admitting: Critical Care Medicine

## 2012-10-02 ENCOUNTER — Telehealth: Payer: Self-pay | Admitting: Critical Care Medicine

## 2012-10-02 NOTE — Telephone Encounter (Signed)
ATC NA unable to leave VM WCB  RX was sent to the pharmacy. 09/30/12 #60 x 0 refills

## 2012-10-03 ENCOUNTER — Telehealth: Payer: Self-pay | Admitting: Critical Care Medicine

## 2012-10-03 MED ORDER — HYDROCODONE-HOMATROPINE 5-1.5 MG/5ML PO SYRP: 5.0000 mL | ORAL_SOLUTION | Freq: Four times a day (QID) | ORAL | Status: DC | PRN

## 2012-10-03 NOTE — Telephone Encounter (Signed)
Last OV 09/09/12 Last fill of Hycodan #266mL 09/19/2012  Would you be okay with refilling this? Thanks!

## 2012-10-03 NOTE — Telephone Encounter (Signed)
Spoke with the pt and notified that his rx was already sent to pharm  He verbalized understanding and states nothing further needed

## 2012-10-03 NOTE — Telephone Encounter (Signed)
atc  NA and unable to leave a VM

## 2012-10-03 NOTE — Telephone Encounter (Signed)
I am ok with refill.  

## 2012-10-03 NOTE — Telephone Encounter (Signed)
Rx has been called in  

## 2012-10-17 ENCOUNTER — Other Ambulatory Visit: Payer: Self-pay | Admitting: Dermatology

## 2012-11-04 ENCOUNTER — Other Ambulatory Visit: Payer: Self-pay | Admitting: Critical Care Medicine

## 2012-11-07 ENCOUNTER — Other Ambulatory Visit (HOSPITAL_COMMUNITY): Payer: Self-pay | Admitting: Orthopedic Surgery

## 2012-11-07 DIAGNOSIS — M7989 Other specified soft tissue disorders: Secondary | ICD-10-CM

## 2012-11-07 DIAGNOSIS — M25562 Pain in left knee: Secondary | ICD-10-CM

## 2012-11-08 ENCOUNTER — Ambulatory Visit (HOSPITAL_COMMUNITY)
Admission: RE | Admit: 2012-11-08 | Discharge: 2012-11-08 | Disposition: A | Discharge status: Home / Self Care | Payer: Medicare Other | Source: Ambulatory Visit | Attending: Orthopedic Surgery | Admitting: Orthopedic Surgery

## 2012-11-08 DIAGNOSIS — Z86718 Personal history of other venous thrombosis and embolism: Secondary | ICD-10-CM | POA: Insufficient documentation

## 2012-11-08 DIAGNOSIS — M7989 Other specified soft tissue disorders: Secondary | ICD-10-CM

## 2012-11-08 DIAGNOSIS — M79609 Pain in unspecified limb: Secondary | ICD-10-CM | POA: Insufficient documentation

## 2012-11-08 DIAGNOSIS — M25562 Pain in left knee: Secondary | ICD-10-CM

## 2012-11-08 NOTE — Progress Notes (Signed)
VASCULAR LAB PRELIMINARY  PRELIMINARY  PRELIMINARY  PRELIMINARY  Left lower extremity venous duplex completed.    Preliminary report:  Left:  No evidence of DVT, superficial thrombosis, or Baker's cyst.  Jamis Kryder, RVS 11/08/2012, 9:41 AM

## 2012-11-10 IMAGING — CT CT HEAD W/O CM
2 series · 16 of 30 positions shown, 18 images · non-contrast
Comparison: Most recent 03/03/2012.  Multiple priors dating to
March 2011.

CLINICAL DATA: Balance issues with dizziness and falling.

CT HEAD WITHOUT CONTRAST
TECHNIQUE: Contiguous axial images were obtained from the base of
the skull through the vertex without contrast.

[Series 2: head w/o · axial · non-contrast · 0.45mm/px · z∈[+25,+138]mm · 8 of 28 slices shown, 10 images]
[im 4/28  brain]
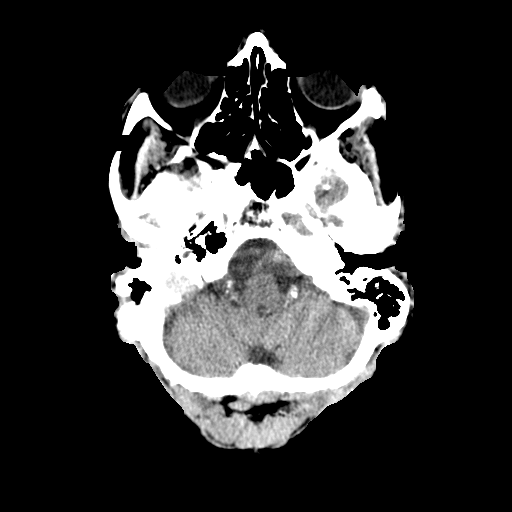
[im 4/28  bone]
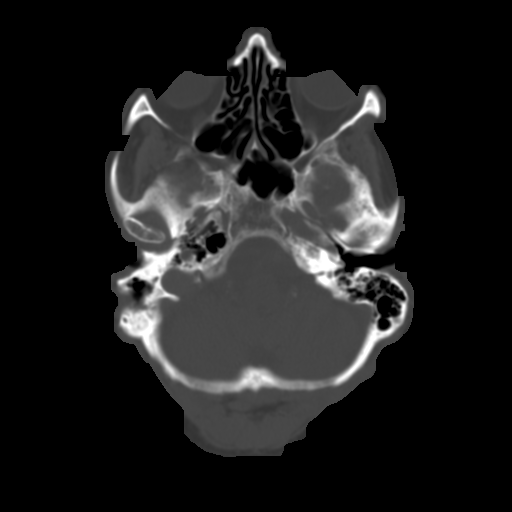
[im 7/28  brain]
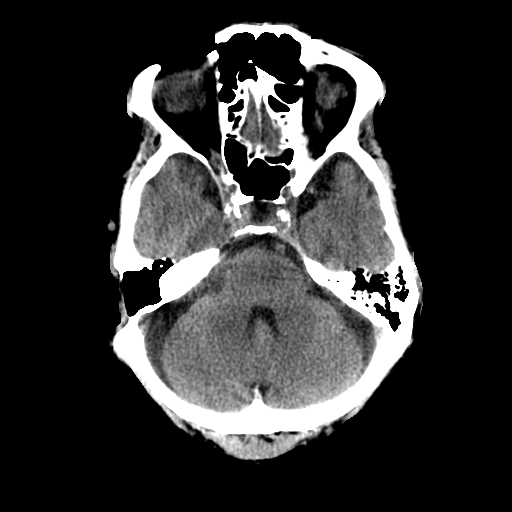
[im 10/28  brain]
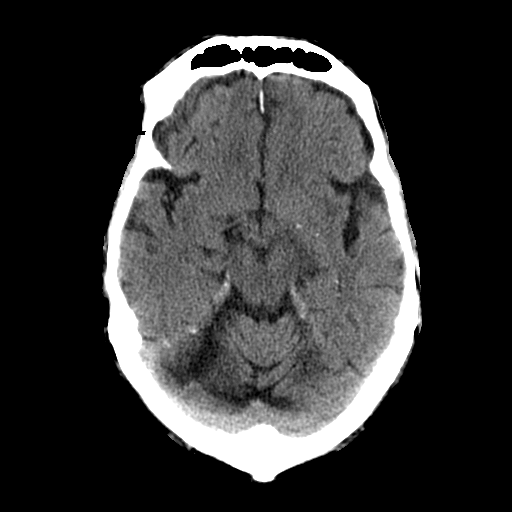
[im 13/28  brain]
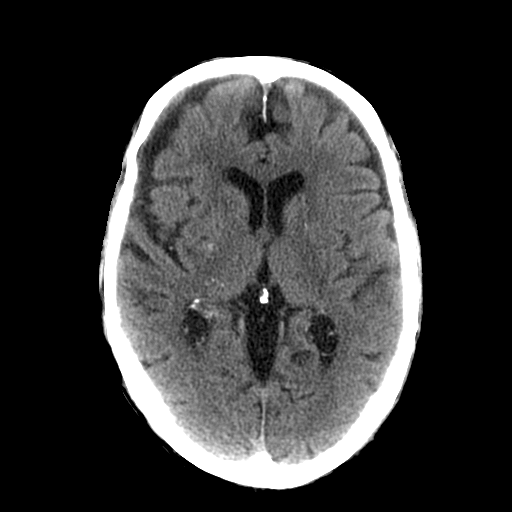
[im 16/28  brain]
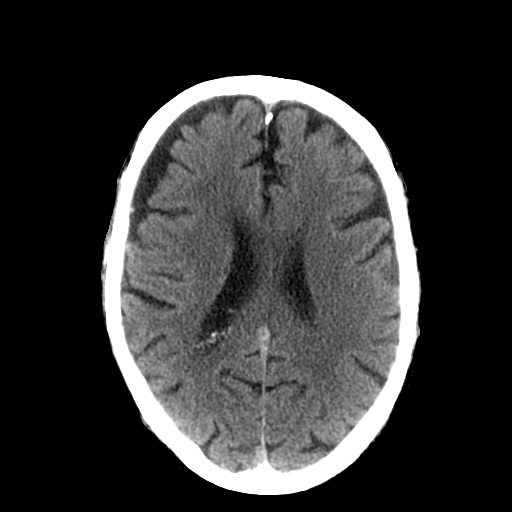
[im 16/28  bone]
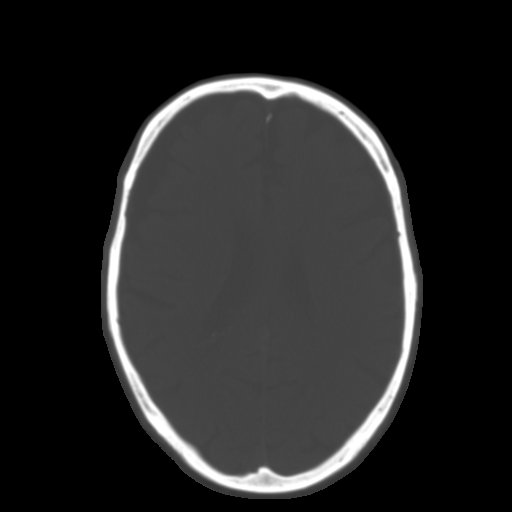
[im 19/28  brain]
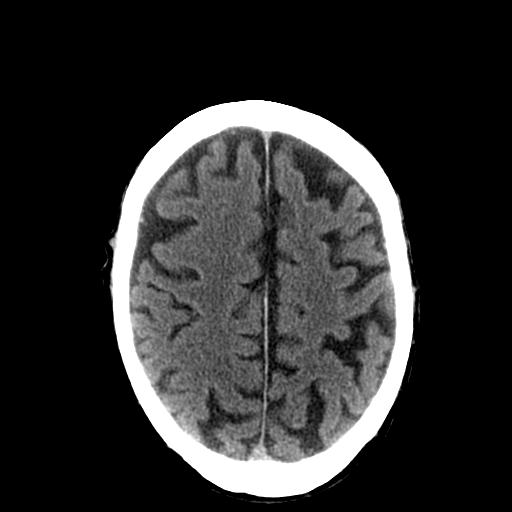
[im 22/28  brain]
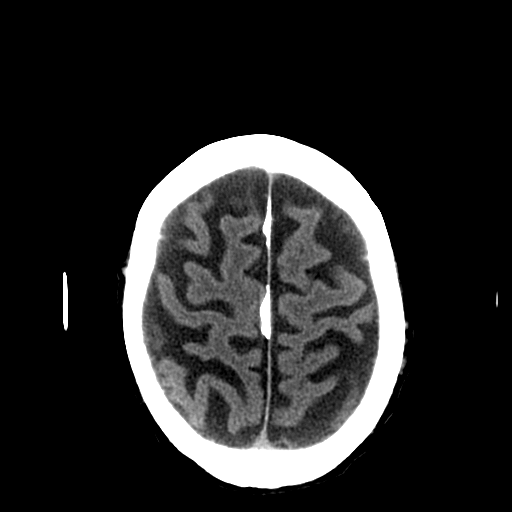
[im 25/28  brain]
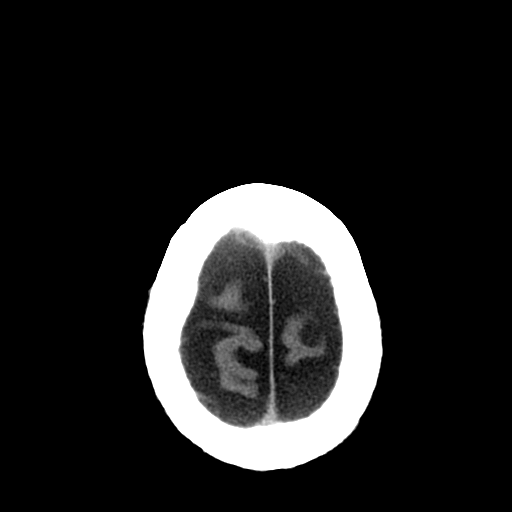

[Series 3: head bone · axial · 0.45mm/px · z∈[+21,+139]mm · 8 of 56 slices shown]
[im 6/56  bone]
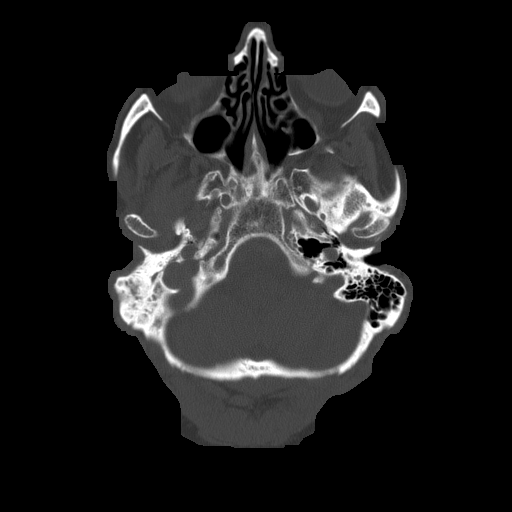
[im 12/56  bone]
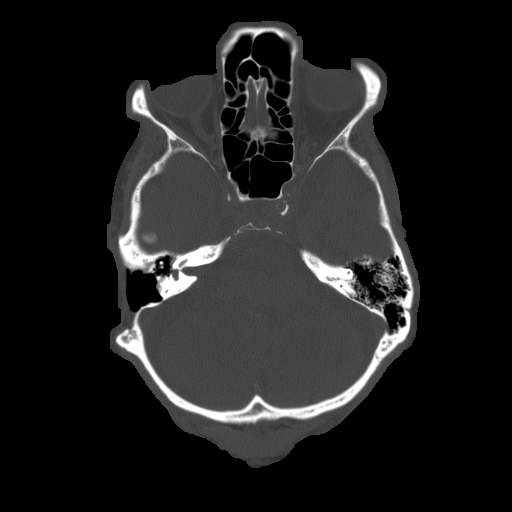
[im 18/56  bone]
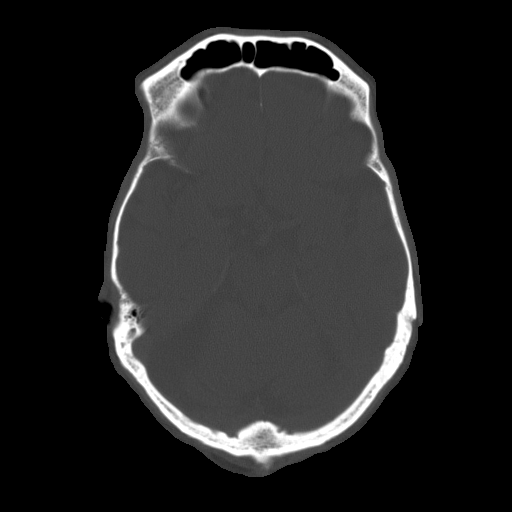
[im 24/56  bone]
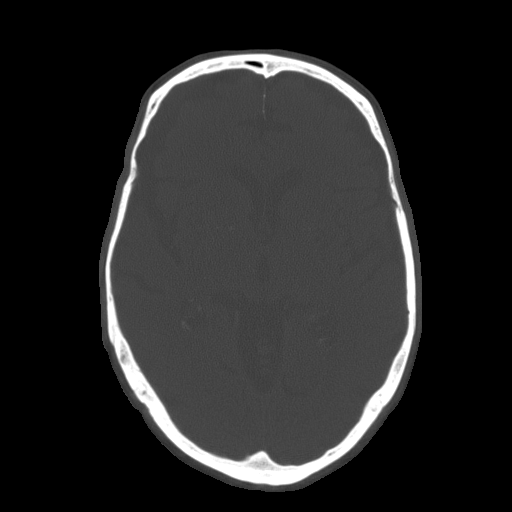
[im 32/56  bone]
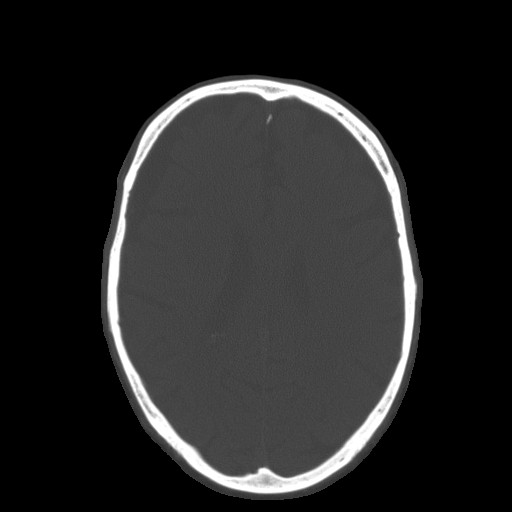
[im 38/56  bone]
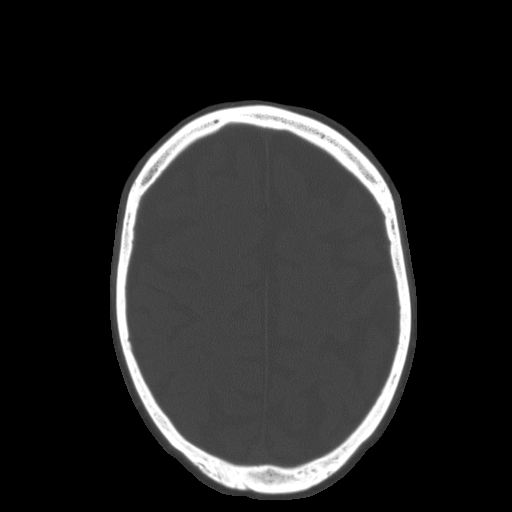
[im 44/56  bone]
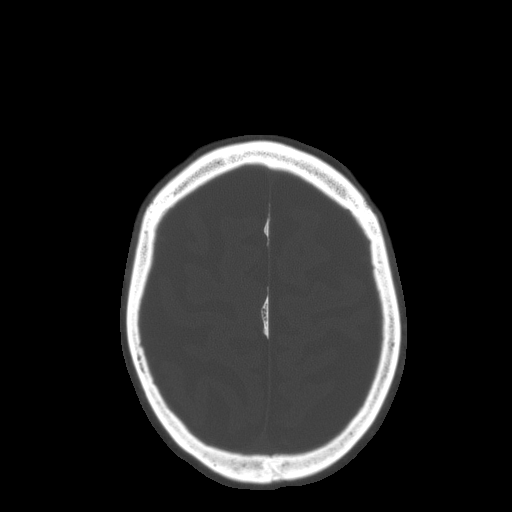
[im 50/56  bone]
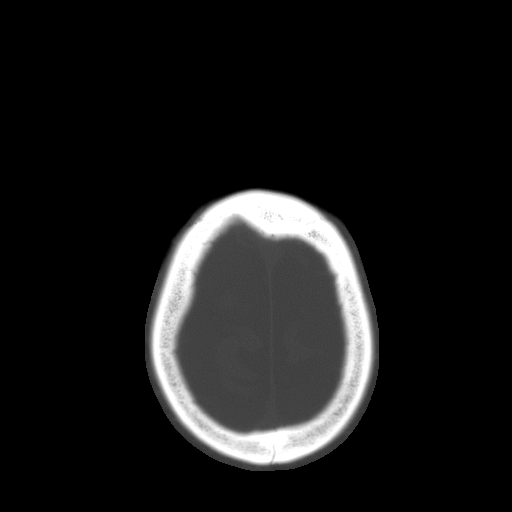

[16 of 30 positions shown; findings below may reference images not displayed]

FINDINGS: There is no evidence for acute stroke, acute intracranial
hemorrhage, or hydrocephalus.

A 10 mm thick right frontal hypodense extra-axial fluid collection
having attenuation in the 15-20 HU range is consistent with a
chronic subdural hematoma/hygroma. Bilateral posterior fossa
hypodense collections, right greater than left, are also
consistent with subdural hygromas.  All these collections are
unchanged compared with February 2012.  No new findings are
appreciated. There is a large remote left basal ganglia infarct.
There is mild to moderate atrophy with chronic microvascular
ischemic change.  There is moderate vascular calcification in the
carotid siphon regions.

Calvarium is intact.  Sinuses and mastoids are clear.
IMPRESSION: Stable right frontal chronic subdural/hygroma.  Stable bilateral
posterior fossa hygromas.  No significant worsening compared with
February 2012.

## 2012-11-28 ENCOUNTER — Encounter: Payer: Self-pay | Admitting: Critical Care Medicine

## 2012-11-28 ENCOUNTER — Ambulatory Visit (INDEPENDENT_AMBULATORY_CARE_PROVIDER_SITE_OTHER): Payer: Medicare Other | Admitting: Critical Care Medicine

## 2012-11-28 VITALS — BP 124/70 | HR 82 | Temp 97.6°F | Ht 69.0 in | Wt 164.0 lb

## 2012-11-28 DIAGNOSIS — R05 Cough: Secondary | ICD-10-CM

## 2012-11-28 DIAGNOSIS — R059 Cough, unspecified: Secondary | ICD-10-CM

## 2012-11-28 DIAGNOSIS — Z9181 History of falling: Secondary | ICD-10-CM | POA: Insufficient documentation

## 2012-11-28 MED ORDER — PREDNISONE 10 MG PO TABS: ORAL_TABLET | ORAL | Status: DC

## 2012-11-28 MED ORDER — FLUTTER DEVI: Status: DC

## 2012-11-28 MED ORDER — HYDROCODONE-HOMATROPINE 5-1.5 MG/5ML PO SYRP: 5.0000 mL | ORAL_SOLUTION | Freq: Four times a day (QID) | ORAL | Status: DC | PRN

## 2012-11-28 MED ORDER — AMOXICILLIN-POT CLAVULANATE 875-125 MG PO TABS: 1.0000 | ORAL_TABLET | Freq: Two times a day (BID) | ORAL | Status: DC

## 2012-11-28 NOTE — Assessment & Plan Note (Signed)
Cyclical cough with upper airway instability due to postnasal drip syndrome without evidence of COPD exacerbation note allergic rhinitis is a major driver with the postnasal drip syndrome Plan Stop advair Use albuterol as needed Augmentin twice daily for 7days Prednisone taper for 8days Above sent to downstairs pharmacy Use flutter valve 3 times daily Cough syrup as needed Return 3 months

## 2012-11-28 NOTE — Patient Instructions (Addendum)
Stop advair Use albuterol as needed Augmentin twice daily for 7days Prednisone taper for 8days Above sent to downstairs pharmacy Use flutter valve 3 times daily Cough syrup as needed Return 3 months

## 2012-11-28 NOTE — Progress Notes (Signed)
Subjective:    Patient ID: Micheal Frank, male    DOB: [birth year], 79 y.o.   MRN: [id number]  HPI  Pulmonary OV  This is an 79 y.o. male with Golds III COPD primary emphysema DLCO 69% FeV1 94% 10/09  This pt has been with [facility] MD in the past. Dx COPD for 20 yrs. Last pfts 11/08 at [facility]:  DLCO 58%. also OSA on CPAP  Prolonged and multiple hospitalizations in 2012.  Infected L TKR 5/12 with Staph aureus. Removed Knee in 8/12,  Complications of epidural abscess, SDH spontaneous from coumadin (6/12), Infected L TKR explanted and finally new TKR 11/12.   DId not have to drain the hematoma in the brain.  Did drain the abscess in the back.     11/28/2012 Cough from before better with protocol.  But now with mucus that wont come up with more productive cough. Mucus now is sl tinted. Notes some pndrip.  Notes sneezing. Dsypnea is satisfactory. No SABA use. Down to one puff advair daily  Past Medical History  Diagnosis Date  . Subdural hematoma 03/30/11  . Epidural abscess   . Staphylococcus aureus bacteremia with sepsis   . Prosthetic joint infection   . Hyperlipidemia   . History of blood clots   . Blood dyscrasia     pt CAN NOT have any blood thinners d/t hx brain bleed  . S/P right heart catheterization     at least 30yrs  . Asthma   . COPD (chronic obstructive pulmonary disease)     emphysema  . Shortness of breath     with exertion  . Pneumonia     as a child and in 1952;had a pneumonia vaccine couple of years ago  . Peripheral neuropathy   . Short-term memory loss   . Arthritis     left leg also has swelling  . Gastric ulcer   . Constipation     miralax prn  . Depression     takes wellbutrin bid  . Urinary frequency     wears depends  . Complication of anesthesia     confusion with anesthesia  . Sleep apnea     has CPAP but doesn't use it;sleep study done at least 12yrs ago     No family history on file.   History   Social History  . Marital Status:  Married    Spouse Name: N/A    Number of Children: N/A  . Years of Education: N/A   Occupational History  . Not on file.   Social History Main Topics  . Smoking status: Former Smoker -- 2.00 packs/day for 40 years    Types: Cigarettes    Quit date: 07/17/1978  . Smokeless tobacco: Never Used  . Alcohol Use: No  . Drug Use: No  . Sexually Active: Not on file   Other Topics Concern  . Not on file   Social History Narrative  . No narrative on file     Allergies  Allergen Reactions  . Anticoagulant Compound     History of bleeding on the brain per daughter     Outpatient Prescriptions Prior to Visit  Medication Sig Dispense Refill  . albuterol (PROAIR HFA) 108 (90 BASE) MCG/ACT inhaler Inhale 2 puffs into the lungs every 6 (six) hours as needed for wheezing.  1 Inhaler  4  . benzonatate (TESSALON) 200 MG capsule TAKE ONE CAPSULE 3-4 TIMES DAILY AS NEEDED FOR COUGH  60 capsule  0  . buPROPion (WELLBUTRIN SR) 100 MG 12 hr tablet Take 200 mg by mouth 2 (two) times daily.       . calcium carbonate (OS-CAL) 600 MG TABS Take 600 mg by mouth daily.       . Chlorpheniramine Tannate 12 MG TABS Take 1 tablet (12 mg total) by mouth at bedtime.  30 each  4  . Dextromethorphan-Guaifenesin (TUSSIN DM) 10-100 MG/5ML liquid Take 5 mLs by mouth every 4 (four) hours as needed.      . diazepam (VALIUM) 5 MG tablet Take 5 mg by mouth every 8 (eight) hours as needed. For anxiety      . fluticasone (FLONASE) 50 MCG/ACT nasal spray Place 2 sprays into the nose 2 (two) times daily.  16 g  6  . guaiFENesin (MUCINEX) 600 MG 12 hr tablet Take 1,200 mg by mouth daily.       Marland Kitchen HYDROcodone-acetaminophen (NORCO/VICODIN) 5-325 MG per tablet Take 1 tablet by mouth every 6 (six) hours as needed. For pain      . meclizine (ANTIVERT) 25 MG tablet Take 25 mg by mouth 4 (four) times daily as needed. For vertigo      . Multiple Vitamins-Minerals (MULTIVITAL PO) Take 1 tablet by mouth daily.      Marland Kitchen oxyCODONE (OXY  IR/ROXICODONE) 5 MG immediate release tablet Take 1-2 tablets by mouth. 1-2 every 4 hours as needed      . rOPINIRole (REQUIP) 1 MG tablet Take 1 mg by mouth at bedtime.       . simvastatin (ZOCOR) 40 MG tablet Take 40 mg by mouth at bedtime.       . triamterene (DYRENIUM) 50 MG capsule 1/2 tab by mouth once daily      . benzonatate (TESSALON) 200 MG capsule TAKE ONE CAPSULE 3-4 TIMES DAILY AS NEEDED FOR COUGH  60 capsule  0  . Fluticasone-Salmeterol (ADVAIR) 250-50 MCG/DOSE AEPB Inhale 1 puff into the lungs every 12 (twelve) hours.  60 each  6  . HYDROcodone-homatropine (HYCODAN) 5-1.5 MG/5ML syrup Take 5 mLs by mouth every 6 (six) hours as needed for cough.  240 mL  0   No facility-administered medications prior to visit.     Review of Systems Constitutional:   No  weight loss, night sweats,  Fevers, chills, fatigue, lassitude. HEENT:   No headaches,  Difficulty swallowing,  Tooth/dental problems,  Sore throat,                No sneezing, itching, ear ache,  +nasal congestion, post nasal drip,   CV:  No chest pain,  Orthopnea, PND, swelling in lower extremities, anasarca, dizziness, palpitations  GI  No heartburn, indigestion, abdominal pain, nausea, vomiting, diarrhea, change in bowel habits, loss of appetite  Resp: no wheezing.  No chest wall deformity. Notes dry hacky cough  Skin: no rash or lesions.  GU: no dysuria, change in color of urine, no urgency or frequency.  No flank pain.  MS:  No joint pain or swelling.  No decreased range of motion.  No back pain.  Psych:  No change in mood or affect. No depression or anxiety.  No memory loss.     Objective:   Physical Exam  Filed Vitals:   11/28/12 0922  BP: 124/70  Pulse: 82  Temp: 97.6 F (36.4 C)  TempSrc: Oral  Height: 5\' 9"  (1.753 m)  Weight: 164 lb (74.39 kg)  SpO2: 91%    Gen: Pleasant, well-nourished, in no  distress,  normal affect  ENT: No lesions,  mouth clear,  oropharynx clear, no postnasal drip,    Neck: No JVD, no TMG, no carotid bruits  Lungs: coarse rhonchi with  no wheezing   Cardiovascular: RRR, heart sounds normal, no murmur or gallops, no peripheral edema  Abdomen: soft and NT, no HSM,  BS normal  Musculoskeletal: No deformities, no cyanosis or clubbing  Neuro: alert, non focal  Skin: Warm, no lesions or rashes        Assessment & Plan:   Cough Cyclical cough with upper airway instability due to postnasal drip syndrome without evidence of COPD exacerbation note allergic rhinitis is a major driver with the postnasal drip syndrome Plan Stop advair Use albuterol as needed Augmentin twice daily for 7days Prednisone taper for 8days Above sent to downstairs pharmacy Use flutter valve 3 times daily Cough syrup as needed Return 3 months     Updated Medication List Outpatient Encounter Prescriptions as of 11/28/2012  Medication Sig Dispense Refill  . albuterol (PROAIR HFA) 108 (90 BASE) MCG/ACT inhaler Inhale 2 puffs into the lungs every 6 (six) hours as needed for wheezing.  1 Inhaler  4  . benzonatate (TESSALON) 200 MG capsule TAKE ONE CAPSULE 3-4 TIMES DAILY AS NEEDED FOR COUGH  60 capsule  0  . buPROPion (WELLBUTRIN SR) 100 MG 12 hr tablet Take 200 mg by mouth 2 (two) times daily.       . calcium carbonate (OS-CAL) 600 MG TABS Take 600 mg by mouth daily.       . Chlorpheniramine Tannate 12 MG TABS Take 1 tablet (12 mg total) by mouth at bedtime.  30 each  4  . Dextromethorphan-Guaifenesin (TUSSIN DM) 10-100 MG/5ML liquid Take 5 mLs by mouth every 4 (four) hours as needed.      . diazepam (VALIUM) 5 MG tablet Take 5 mg by mouth every 8 (eight) hours as needed. For anxiety      . fluticasone (FLONASE) 50 MCG/ACT nasal spray Place 2 sprays into the nose 2 (two) times daily.  16 g  6  . guaiFENesin (MUCINEX) 600 MG 12 hr tablet Take 1,200 mg by mouth daily.       Marland Kitchen HYDROcodone-acetaminophen (NORCO/VICODIN) 5-325 MG per tablet Take 1 tablet by mouth every 6 (six)  hours as needed. For pain      . HYDROcodone-homatropine (HYCODAN) 5-1.5 MG/5ML syrup Take 5 mLs by mouth every 6 (six) hours as needed for cough.  240 mL  0  . meclizine (ANTIVERT) 25 MG tablet Take 25 mg by mouth 4 (four) times daily as needed. For vertigo      . Multiple Vitamins-Minerals (MULTIVITAL PO) Take 1 tablet by mouth daily.      Marland Kitchen oxyCODONE (OXY IR/ROXICODONE) 5 MG immediate release tablet Take 1-2 tablets by mouth. 1-2 every 4 hours as needed      . rOPINIRole (REQUIP) 1 MG tablet Take 1 mg by mouth at bedtime.       . simvastatin (ZOCOR) 40 MG tablet Take 40 mg by mouth at bedtime.       . triamterene (DYRENIUM) 50 MG capsule 1/2 tab by mouth once daily      . [DISCONTINUED] benzonatate (TESSALON) 200 MG capsule TAKE ONE CAPSULE 3-4 TIMES DAILY AS NEEDED FOR COUGH  60 capsule  0  . [DISCONTINUED] Fluticasone-Salmeterol (ADVAIR) 250-50 MCG/DOSE AEPB Inhale 1 puff into the lungs every 12 (twelve) hours.  60 each  6  . [DISCONTINUED] Fluticasone-Salmeterol (  ADVAIR) 250-50 MCG/DOSE AEPB Inhale 1 puff into the lungs daily.      . [DISCONTINUED] HYDROcodone-homatropine (HYCODAN) 5-1.5 MG/5ML syrup Take 5 mLs by mouth every 6 (six) hours as needed for cough.  240 mL  0  . amoxicillin-clavulanate (AUGMENTIN) 875-125 MG per tablet Take 1 tablet by mouth 2 (two) times daily.  14 tablet  0  . predniSONE (DELTASONE) 10 MG tablet Take 4 for two days three for two days two for two days one for two days  20 tablet  0  . Respiratory Therapy Supplies (FLUTTER) DEVI Use 3 times daily  1 each  0   No facility-administered encounter medications on file as of 11/28/2012.

## 2013-01-16 ENCOUNTER — Other Ambulatory Visit (HOSPITAL_COMMUNITY): Payer: Self-pay | Admitting: Orthopedic Surgery

## 2013-01-16 ENCOUNTER — Other Ambulatory Visit (HOSPITAL_COMMUNITY): Payer: Medicare Other

## 2013-01-16 ENCOUNTER — Emergency Department (HOSPITAL_COMMUNITY): Payer: Medicare Other

## 2013-01-16 ENCOUNTER — Encounter (HOSPITAL_COMMUNITY): Payer: Self-pay | Admitting: *Deleted

## 2013-01-16 ENCOUNTER — Emergency Department (HOSPITAL_COMMUNITY)
Admission: EM | Admit: 2013-01-16 | Discharge: 2013-01-16 | Disposition: A | Discharge status: Home / Self Care | Payer: Medicare Other | Attending: Emergency Medicine | Admitting: Emergency Medicine

## 2013-01-16 ENCOUNTER — Other Ambulatory Visit (HOSPITAL_COMMUNITY): Payer: Self-pay | Admitting: Neurology

## 2013-01-16 DIAGNOSIS — S329XXA Fracture of unspecified parts of lumbosacral spine and pelvis, initial encounter for closed fracture: Secondary | ICD-10-CM

## 2013-01-16 DIAGNOSIS — J449 Chronic obstructive pulmonary disease, unspecified: Secondary | ICD-10-CM | POA: Insufficient documentation

## 2013-01-16 DIAGNOSIS — Z8679 Personal history of other diseases of the circulatory system: Secondary | ICD-10-CM | POA: Insufficient documentation

## 2013-01-16 DIAGNOSIS — Z79899 Other long term (current) drug therapy: Secondary | ICD-10-CM | POA: Insufficient documentation

## 2013-01-16 DIAGNOSIS — G4730 Sleep apnea, unspecified: Secondary | ICD-10-CM | POA: Insufficient documentation

## 2013-01-16 DIAGNOSIS — Z8739 Personal history of other diseases of the musculoskeletal system and connective tissue: Secondary | ICD-10-CM | POA: Insufficient documentation

## 2013-01-16 DIAGNOSIS — Y939 Activity, unspecified: Secondary | ICD-10-CM | POA: Insufficient documentation

## 2013-01-16 DIAGNOSIS — R519 Headache, unspecified: Secondary | ICD-10-CM

## 2013-01-16 DIAGNOSIS — Z8619 Personal history of other infectious and parasitic diseases: Secondary | ICD-10-CM | POA: Insufficient documentation

## 2013-01-16 DIAGNOSIS — E785 Hyperlipidemia, unspecified: Secondary | ICD-10-CM | POA: Insufficient documentation

## 2013-01-16 DIAGNOSIS — Z87891 Personal history of nicotine dependence: Secondary | ICD-10-CM | POA: Insufficient documentation

## 2013-01-16 DIAGNOSIS — F3289 Other specified depressive episodes: Secondary | ICD-10-CM | POA: Insufficient documentation

## 2013-01-16 DIAGNOSIS — Z8719 Personal history of other diseases of the digestive system: Secondary | ICD-10-CM | POA: Insufficient documentation

## 2013-01-16 DIAGNOSIS — S322XXA Fracture of coccyx, initial encounter for closed fracture: Secondary | ICD-10-CM | POA: Insufficient documentation

## 2013-01-16 DIAGNOSIS — Z8711 Personal history of peptic ulcer disease: Secondary | ICD-10-CM | POA: Insufficient documentation

## 2013-01-16 DIAGNOSIS — N390 Urinary tract infection, site not specified: Secondary | ICD-10-CM

## 2013-01-16 DIAGNOSIS — Y929 Unspecified place or not applicable: Secondary | ICD-10-CM | POA: Insufficient documentation

## 2013-01-16 DIAGNOSIS — J4489 Other specified chronic obstructive pulmonary disease: Secondary | ICD-10-CM | POA: Insufficient documentation

## 2013-01-16 DIAGNOSIS — IMO0002 Reserved for concepts with insufficient information to code with codable children: Secondary | ICD-10-CM | POA: Insufficient documentation

## 2013-01-16 DIAGNOSIS — R52 Pain, unspecified: Secondary | ICD-10-CM

## 2013-01-16 DIAGNOSIS — W1809XA Striking against other object with subsequent fall, initial encounter: Secondary | ICD-10-CM

## 2013-01-16 DIAGNOSIS — Z8701 Personal history of pneumonia (recurrent): Secondary | ICD-10-CM | POA: Insufficient documentation

## 2013-01-16 DIAGNOSIS — Z8669 Personal history of other diseases of the nervous system and sense organs: Secondary | ICD-10-CM | POA: Insufficient documentation

## 2013-01-16 DIAGNOSIS — S0990XA Unspecified injury of head, initial encounter: Secondary | ICD-10-CM | POA: Insufficient documentation

## 2013-01-16 DIAGNOSIS — F329 Major depressive disorder, single episode, unspecified: Secondary | ICD-10-CM | POA: Insufficient documentation

## 2013-01-16 DIAGNOSIS — S3210XA Unspecified fracture of sacrum, initial encounter for closed fracture: Secondary | ICD-10-CM

## 2013-01-16 LAB — BASIC METABOLIC PANEL
BUN: 18 mg/dL (ref 6–23)
CO2: 29 mEq/L (ref 19–32)
Chloride: 102 mEq/L (ref 96–112)
Creatinine, Ser: 1.36 mg/dL — ABNORMAL HIGH (ref 0.50–1.35)

## 2013-01-16 LAB — CBC WITH DIFFERENTIAL/PLATELET
Basophils Absolute: 0 10*3/uL (ref 0.0–0.1)
HCT: 39.6 % (ref 39.0–52.0)
Hemoglobin: 13 g/dL (ref 13.0–17.0)
Lymphocytes Relative: 7 % — ABNORMAL LOW (ref 12–46)
Monocytes Absolute: 1.5 10*3/uL — ABNORMAL HIGH (ref 0.1–1.0)
Monocytes Relative: 10 % (ref 3–12)
Neutro Abs: 11.5 10*3/uL — ABNORMAL HIGH (ref 1.7–7.7)
RBC: 4.3 MIL/uL (ref 4.22–5.81)
WBC: 13.9 10*3/uL — ABNORMAL HIGH (ref 4.0–10.5)

## 2013-01-16 LAB — URINALYSIS, ROUTINE W REFLEX MICROSCOPIC
Glucose, UA: NEGATIVE mg/dL
Ketones, ur: NEGATIVE mg/dL
pH: 8 (ref 5.0–8.0)

## 2013-01-16 LAB — URINE MICROSCOPIC-ADD ON

## 2013-01-16 MED ORDER — CEPHALEXIN 500 MG PO CAPS: 500.0000 mg | ORAL_CAPSULE | Freq: Four times a day (QID) | ORAL | Status: DC

## 2013-01-16 NOTE — ED Notes (Signed)
Pt has returned from Radiology.  

## 2013-01-16 NOTE — ED Notes (Signed)
Pt was sent to ED from [facility]. For scans of Lt hip . Pt reported falling over lawnmower fell backwards and hit RT side of head. . Pt denies any LOC but dose report Lt hip Pain.

## 2013-01-16 NOTE — ED Notes (Signed)
Pt in route to Radiology.  

## 2013-01-16 NOTE — ED Provider Notes (Signed)
History    CSN: [id number] Arrival date & time 01/16/13  1732  First MD Initiated Contact with Patient 01/16/13 1816     Chief Complaint  Patient presents with  . Fall   (Consider location/radiation/quality/duration/timing/severity/associated sxs/prior Treatment) HPI Comments: Micheal Frank is a 79 y.o. malehere for evaluation of fall. He saw his orthopedist, today, who suggested he come to the ED for an MRI of the left hip. The patient was in his garage, starting a generator when he fell backwards, striking his left hip and head. Afterwards, he was able to ambulate, with assistance. He lives with his daughter. He has had left knee replacement, and some problems with left knee infection. He has been well recently. There've been no recent illnesses. No recent fever, chills, nausea, vomiting, weight, dizziness, chest pain, shortness of breath, or back pain. At this time, his left hip pain is worse, with movements. There are no other known modifying factors.  Patient is a 79 y.o. male presenting with fall. The history is provided by the patient and the spouse.  Fall This is a recurrent problem.   Past Medical History  Diagnosis Date  . Subdural hematoma 03/30/11  . Epidural abscess   . Staphylococcus aureus bacteremia with sepsis   . Prosthetic joint infection   . Hyperlipidemia   . History of blood clots   . Blood dyscrasia     pt CAN NOT have any blood thinners d/t hx brain bleed  . S/P right heart catheterization     at least 50yrs  . Asthma   . COPD (chronic obstructive pulmonary disease)     emphysema  . Shortness of breath     with exertion  . Pneumonia     as a child and in 1952;had a pneumonia vaccine couple of years ago  . Peripheral neuropathy   . Short-term memory loss   . Arthritis     left leg also has swelling  . Gastric ulcer   . Constipation     miralax prn  . Depression     takes wellbutrin bid  . Urinary frequency     wears depends  . Complication of  anesthesia     confusion with anesthesia  . Sleep apnea     has CPAP but doesn't use it;sleep study done at least 15yrs ago   Past Surgical History  Procedure Laterality Date  . Hernia repair      25+yrs ago  . External ear surgery      shunt placed in right ear d/t mennieres   . Total knee arthroplasty      x 2 left knee 2003/2012  . Filtering procedure      filter placed in neck d/t blood  clot  . Eye surgery      bil cataract surgery  . Skin biopsy      melanoma on head   . Knee arthroscopy    . Total knee revision  05/23/2011    Procedure: TOTAL KNEE REVISION;  Surgeon: [name];  Location: [facility] OR;  Service: Orthopedics;  Laterality: Left;  LEFT KNEE REMOVAL OF SPACER, POSSIBLE REIMPLANTATION OF REVISION TKA VERSES REPLACEMENT ANTIBIOTIC SPACER  . Epidural abscess drainage  12/2010   No family history on file. History  Substance Use Topics  . Smoking status: Former Smoker -- 2.00 packs/day for 40 years    Types: Cigarettes    Quit date: 07/17/1978  . Smokeless tobacco: Never Used  . Alcohol Use:  No    Review of Systems  All other systems reviewed and are negative.    Allergies  Anticoagulant compound  Home Medications   Current Outpatient Rx  Name  Route  Sig  Dispense  Refill  . albuterol (PROAIR HFA) 108 (90 BASE) MCG/ACT inhaler   Inhalation   Inhale 2 puffs into the lungs every 6 (six) hours as needed for wheezing.   1 Inhaler   4   . benzonatate (TESSALON) 200 MG capsule   Oral   Take 200 mg by mouth 2 (two) times daily as needed for cough.         Marland Kitchen buPROPion (WELLBUTRIN SR) 100 MG 12 hr tablet   Oral   Take 200 mg by mouth 2 (two) times daily.          . calcium carbonate (OS-CAL) 600 MG TABS   Oral   Take 600 mg by mouth daily.          Marland Kitchen Dextromethorphan-Guaifenesin (TUSSIN DM) 10-100 MG/5ML liquid   Oral   Take 10 mLs by mouth daily as needed (cough).          . doxazosin (CARDURA) 4 MG tablet   Oral   Take 4 mg by  mouth at bedtime.         . fluticasone (FLONASE) 50 MCG/ACT nasal spray   Nasal   Place 2 sprays into the nose 2 (two) times daily.   16 g   6   . Guaifenesin (MUCINEX MAXIMUM STRENGTH) 1200 MG TB12   Oral   Take 1,200 mg by mouth daily.         Marland Kitchen HYDROcodone-acetaminophen (NORCO/VICODIN) 5-325 MG per tablet   Oral   Take 1 tablet by mouth at bedtime as needed. For pain         . meclizine (ANTIVERT) 25 MG tablet   Oral   Take 25 mg by mouth daily as needed (vertigo).          . Multiple Vitamin (MULTIVITAMIN WITH MINERALS) TABS   Oral   Take 1 tablet by mouth daily.         Marland Kitchen rOPINIRole (REQUIP) 1 MG tablet   Oral   Take 1 mg by mouth at bedtime.          . simvastatin (ZOCOR) 40 MG tablet   Oral   Take 40 mg by mouth at bedtime.          . triamterene-hydrochlorothiazide (MAXZIDE) 75-50 MG per tablet   Oral   Take 0.5 tablets by mouth daily.         . cephALEXin (KEFLEX) 500 MG capsule   Oral   Take 1 capsule (500 mg total) by mouth 4 (four) times daily.   28 capsule   0   . Respiratory Therapy Supplies (FLUTTER) DEVI      Use 3 times daily   1 each   0    BP 126/54  Pulse 76  Temp(Src) 98.5 F (36.9 C) (Oral)  Resp 18  SpO2 90% Physical Exam  Nursing note and vitals reviewed. Constitutional: He is oriented to person, place, and time. He appears well-developed.  Elderly, frail  HENT:  Head: Normocephalic and atraumatic.  Right Ear: External ear normal.  Left Ear: External ear normal.  Eyes: Conjunctivae and EOM are normal. Pupils are equal, round, and reactive to light.  Neck: Normal range of motion and phonation normal. Neck supple.  Cardiovascular: Normal rate, regular rhythm, normal  heart sounds and intact distal pulses.   Pulmonary/Chest: Effort normal. No respiratory distress. He has no wheezes. He has no rales. He exhibits no tenderness and no bony tenderness.  Decreased breath sounds left base.  Abdominal: Soft. Normal  appearance. There is no tenderness.  Musculoskeletal: Normal range of motion.  Left hip tender with passive range of motion; but no limitation that motion. The pelvis is stable.  Neurological: He is alert and oriented to person, place, and time. He has normal strength. No cranial nerve deficit or sensory deficit. He exhibits normal muscle tone. Coordination normal.  Skin: Skin is warm, dry and intact.  Psychiatric: He has a normal mood and affect. His behavior is normal. Judgment and thought content normal.    ED Course  Procedures (including critical care time)   9:45 PM Reevaluation with update and discussion. After initial assessment and treatment, an updated evaluation reveals no change in status or exam. Findings discussed with patient's daughter. His orthopedist, has been paged. Lilee Aldea L    9:30 PM-Consult complete with Dr. Lajoyce Corners. Patient case explained and discussed.  He agrees that pt. Can go home and see [name]  for further evaluation and treatment, in office. Call ended at 2200  Baseline oxygen saturation, on room air, 91% 5/14, pulmonology office.  22:14- oxygen saturation, around 90% on room air. Improves to 96% with deep breathing. He occasionally drops to 86%, when resting. He does not have any dyspnea  Labs Reviewed  CBC WITH DIFFERENTIAL - Abnormal; Notable for the following:    WBC 13.9 (*)    Neutrophils Relative % 83 (*)    Neutro Abs 11.5 (*)    Lymphocytes Relative 7 (*)    Monocytes Absolute 1.5 (*)    All other components within normal limits  BASIC METABOLIC PANEL - Abnormal; Notable for the following:    Potassium 3.4 (*)    Glucose, Bld 108 (*)    Creatinine, Ser 1.36 (*)    GFR calc non Af Amer 46 (*)    GFR calc Af Amer 53 (*)    All other components within normal limits  URINALYSIS, ROUTINE W REFLEX MICROSCOPIC - Abnormal; Notable for the following:    APPearance HAZY (*)    Nitrite POSITIVE (*)    Leukocytes, UA MODERATE (*)    All other  components within normal limits  URINE MICROSCOPIC-ADD ON - Abnormal; Notable for the following:    Bacteria, UA MANY (*)    All other components within normal limits  URINE CULTURE   Dg Chest 1 View  01/16/2013   *RADIOLOGY REPORT*  Clinical Data: Fall, hip pain  CHEST - 1 VIEW  Comparison: Prior radiograph from other 04/22/2012  Findings: Cardiac and mediastinal silhouettes are unchanged. Tortuosity of the intrathoracic aorta with prominent atherosclerotic calcifications is again noted.  The lungs are mildly hypoinflated, with elevation of the left hemidiaphragm.  Prominence of the interstitial markings is grossly stable.  There is likely bibasilar subsegmental atelectasis.  No space consolidation, pleural effusion, or pulmonary edema identified.  The left AC joint appears abnormally widened, somewhat incompletely evaluated on this examination. In addition, an osseous fragment seen at the medial aspect of the scapula may represent an acute fracture.  Diffuse osteopenia is noted.  IMPRESSION: 1. Abnormal widening of the left AC joint with question of associated fracture as above.  Correlation with physical exam and dedicated radiography of the left shoulder is recommended. 2.  Stable appearance of the chest with chronic  interstitial changes.  No acute cardiopulmonary process identified.   Original Report Authenticated By: Rise Mu, M.D.   Ct Head Wo Contrast  01/16/2013   *RADIOLOGY REPORT*  Clinical Data: Fall, hit right head  CT HEAD WITHOUT CONTRAST  Technique:  Contiguous axial images were obtained from the base of the skull through the vertex without contrast.  Comparison: 04/11/2012  Findings: Stable right frontal hygroma measuring up to 10 mm. Stable bilateral posterior fossa hygromas.  No evidence of parenchymal hemorrhage. No mass lesion, mass effect, or midline shift.  No CT evidence of acute infarction.  Old bilateral basal ganglia lacunar infarcts.  Mild subcortical white matter and  periventricular small vessel ischemic changes.  Intracranial atherosclerosis.  Moderate cortical atrophy.  No ventriculomegaly.  The visualized paranasal sinuses are essentially clear. The left mastoid air cells are unopacified.  Prior right mastoidectomy.  No evidence of calvarial fracture.  IMPRESSION: No evidence of acute intracranial abnormality.  Stable right frontal and bilateral posterior fossa hygromas.  Moderate atrophy with small vessel ischemic changes and intracranial atherosclerosis.   Original Report Authenticated By: Charline Bills, M.D.   Mr Hip Left Wo Contrast  01/16/2013   *RADIOLOGY REPORT*  Clinical Data: Status post fall with severe left hip pain.  MRI OF THE LEFT HIP WITHOUT CONTRAST  Technique:  Multiplanar, multisequence MR imaging was performed. No intravenous contrast was administered.  Comparison: None.  Findings: The patient has acute nondisplaced fractures of the left inferior pubic ramus and high left superior pubic ramus with associated marrow and surrounding soft tissue edema.  Both hips are located and there is no hip fracture.  The patient also has an acute left sacral ala fracture.  No other fracture is identified.  There is a small amount of fluid in the trochanteric bursa bilaterally consistent with bursitis.  There is some edema in the short adductors on the left consistent with strain but no frank tear is present.  Imaged intrapelvic contents demonstrate sigmoid diverticulosis. The prostate gland is enlarged measuring 6.6 cm transverse by 5.7 cm AP by 6.1 cm cranial-caudal.  IMPRESSION:  1. The study is positive for acute, nondisplaced fractures of the high left superior pubic ramus and left inferior pubic ramus. There is also a left sacral ala fracture which appears acute. 2.  Bilateral trochanteric bursitis, more notable on the right. 3.  Enlarged prostate gland. 4.  Diverticulosis.   Original Report Authenticated By: Holley Dexter, M.D.   1. Fall against object,  initial encounter   2. Pelvic fracture, closed, initial encounter   3. Sacral fracture, closed, initial encounter   4. UTI (lower urinary tract infection)     MDM  Apparent mechanical fall with left pelvis injury. No sign of hemodynamic instability. Fractures unlikely to require surgery. He may have a urinary tract infection. A culture has been ordered. Doubt metabolic instability, serious bacterial infection or impending vascular collapse; the patient is stable for discharge.  Nursing Notes Reviewed/ Care Coordinated, and agree without changes. Applicable Imaging Reviewed. Radiologic imaging report reviewed and images by CT and MRI  - viewed, by me. Interpretation of Laboratory Data incorporated into ED treatment    Plan: Home Medications- Keflex, ( his orthopedist, refilled his oxycodone, today); Home Treatments and Observation- Rest, Walker when up; watch for increased pain or trouble breathing; return here if the recommended treatment, does not improve the symptoms; Recommended follow up- Call Ortho and Pulmonary for f/u    Micheal Melter, MD 01/17/13 0005

## 2013-01-19 ENCOUNTER — Encounter (HOSPITAL_COMMUNITY): Payer: Self-pay | Admitting: Emergency Medicine

## 2013-01-19 ENCOUNTER — Emergency Department (HOSPITAL_COMMUNITY): Payer: Medicare Other

## 2013-01-19 ENCOUNTER — Inpatient Hospital Stay (HOSPITAL_COMMUNITY)
Admission: EM | Admit: 2013-01-19 | Discharge: 2013-01-23 | DRG: 552 | Disposition: A | Discharge status: Skilled Nursing Facility | Payer: Medicare Other | Attending: Internal Medicine | Admitting: Internal Medicine

## 2013-01-19 DIAGNOSIS — M129 Arthropathy, unspecified: Secondary | ICD-10-CM | POA: Diagnosis present

## 2013-01-19 DIAGNOSIS — F3289 Other specified depressive episodes: Secondary | ICD-10-CM | POA: Diagnosis present

## 2013-01-19 DIAGNOSIS — S3210XS Unspecified fracture of sacrum, sequela: Secondary | ICD-10-CM

## 2013-01-19 DIAGNOSIS — IMO0002 Reserved for concepts with insufficient information to code with codable children: Secondary | ICD-10-CM

## 2013-01-19 DIAGNOSIS — J438 Other emphysema: Secondary | ICD-10-CM | POA: Diagnosis present

## 2013-01-19 DIAGNOSIS — W19XXXA Unspecified fall, initial encounter: Secondary | ICD-10-CM | POA: Diagnosis present

## 2013-01-19 DIAGNOSIS — Z8673 Personal history of transient ischemic attack (TIA), and cerebral infarction without residual deficits: Secondary | ICD-10-CM

## 2013-01-19 DIAGNOSIS — G589 Mononeuropathy, unspecified: Secondary | ICD-10-CM | POA: Diagnosis present

## 2013-01-19 DIAGNOSIS — R82998 Other abnormal findings in urine: Secondary | ICD-10-CM | POA: Diagnosis not present

## 2013-01-19 DIAGNOSIS — S329XXA Fracture of unspecified parts of lumbosacral spine and pelvis, initial encounter for closed fracture: Secondary | ICD-10-CM

## 2013-01-19 DIAGNOSIS — N183 Chronic kidney disease, stage 3 unspecified: Secondary | ICD-10-CM | POA: Diagnosis present

## 2013-01-19 DIAGNOSIS — G4733 Obstructive sleep apnea (adult) (pediatric): Secondary | ICD-10-CM | POA: Diagnosis present

## 2013-01-19 DIAGNOSIS — Z77090 Contact with and (suspected) exposure to asbestos: Secondary | ICD-10-CM

## 2013-01-19 DIAGNOSIS — F329 Major depressive disorder, single episode, unspecified: Secondary | ICD-10-CM | POA: Diagnosis present

## 2013-01-19 DIAGNOSIS — S32592S Other specified fracture of left pubis, sequela: Secondary | ICD-10-CM

## 2013-01-19 DIAGNOSIS — G2581 Restless legs syndrome: Secondary | ICD-10-CM | POA: Diagnosis present

## 2013-01-19 DIAGNOSIS — N3941 Urge incontinence: Secondary | ICD-10-CM | POA: Diagnosis not present

## 2013-01-19 DIAGNOSIS — Z87891 Personal history of nicotine dependence: Secondary | ICD-10-CM

## 2013-01-19 DIAGNOSIS — Z79899 Other long term (current) drug therapy: Secondary | ICD-10-CM

## 2013-01-19 DIAGNOSIS — E785 Hyperlipidemia, unspecified: Secondary | ICD-10-CM | POA: Diagnosis present

## 2013-01-19 DIAGNOSIS — G4730 Sleep apnea, unspecified: Secondary | ICD-10-CM | POA: Diagnosis present

## 2013-01-19 DIAGNOSIS — S32509A Unspecified fracture of unspecified pubis, initial encounter for closed fracture: Secondary | ICD-10-CM | POA: Diagnosis present

## 2013-01-19 DIAGNOSIS — Z86718 Personal history of other venous thrombosis and embolism: Secondary | ICD-10-CM

## 2013-01-19 DIAGNOSIS — M76899 Other specified enthesopathies of unspecified lower limb, excluding foot: Secondary | ICD-10-CM | POA: Diagnosis present

## 2013-01-19 DIAGNOSIS — J449 Chronic obstructive pulmonary disease, unspecified: Secondary | ICD-10-CM | POA: Diagnosis present

## 2013-01-19 DIAGNOSIS — Z9849 Cataract extraction status, unspecified eye: Secondary | ICD-10-CM

## 2013-01-19 DIAGNOSIS — Z96659 Presence of unspecified artificial knee joint: Secondary | ICD-10-CM

## 2013-01-19 DIAGNOSIS — R8281 Pyuria: Secondary | ICD-10-CM | POA: Diagnosis not present

## 2013-01-19 DIAGNOSIS — G609 Hereditary and idiopathic neuropathy, unspecified: Secondary | ICD-10-CM | POA: Diagnosis present

## 2013-01-19 DIAGNOSIS — S3210XA Unspecified fracture of sacrum, initial encounter for closed fracture: Principal | ICD-10-CM | POA: Diagnosis present

## 2013-01-19 LAB — CBC WITH DIFFERENTIAL/PLATELET
Basophils Absolute: 0 10*3/uL (ref 0.0–0.1)
Eosinophils Absolute: 0.1 10*3/uL (ref 0.0–0.7)
Eosinophils Relative: 1 % (ref 0–5)
HCT: 40.6 % (ref 39.0–52.0)
Hemoglobin: 13.4 g/dL (ref 13.0–17.0)
Lymphocytes Relative: 11 % — ABNORMAL LOW (ref 12–46)
Lymphs Abs: 1.1 10*3/uL (ref 0.7–4.0)
MCH: 30.5 pg (ref 26.0–34.0)
MCV: 92.3 fL (ref 78.0–100.0)
Monocytes Absolute: 1.5 10*3/uL — ABNORMAL HIGH (ref 0.1–1.0)
Monocytes Relative: 13 % — ABNORMAL HIGH (ref 3–12)
Neutro Abs: 7.9 10*3/uL — ABNORMAL HIGH (ref 1.7–7.7)
Platelets: 142 10*3/uL — ABNORMAL LOW (ref 150–400)
RBC: 4.4 MIL/uL (ref 4.22–5.81)
RDW: 14.1 % (ref 11.5–15.5)
WBC: 11.1 10*3/uL — ABNORMAL HIGH (ref 4.0–10.5)

## 2013-01-19 LAB — BASIC METABOLIC PANEL
BUN: 24 mg/dL — ABNORMAL HIGH (ref 6–23)
CO2: 28 mEq/L (ref 19–32)
Calcium: 8.8 mg/dL (ref 8.4–10.5)
GFR calc non Af Amer: 45 mL/min — ABNORMAL LOW (ref >90)
Glucose, Bld: 78 mg/dL (ref 70–99)

## 2013-01-19 LAB — COMPREHENSIVE METABOLIC PANEL
AST: 16 U/L (ref 0–37)
CO2: 29 mEq/L (ref 19–32)
Calcium: 8.5 mg/dL (ref 8.4–10.5)
Chloride: 102 mEq/L (ref 96–112)
Creatinine, Ser: 1.35 mg/dL (ref 0.50–1.35)
GFR calc Af Amer: 54 mL/min — ABNORMAL LOW (ref >90)
GFR calc non Af Amer: 46 mL/min — ABNORMAL LOW (ref >90)
Glucose, Bld: 83 mg/dL (ref 70–99)
Potassium: 3.8 mEq/L (ref 3.5–5.1)
Sodium: 138 mEq/L (ref 135–145)

## 2013-01-19 LAB — PHOSPHORUS: Phosphorus: 3.9 mg/dL (ref 2.3–4.6)

## 2013-01-19 LAB — URINE CULTURE: Colony Count: >=100000

## 2013-01-19 MED ORDER — SIMVASTATIN 40 MG PO TABS
40.0000 mg | ORAL_TABLET | Freq: Every day | ORAL | Status: DC
  Administered 2013-01-19 – 2013-01-22 (×4): 40 mg via ORAL
  Filled 2013-01-19 (×5): qty 1

## 2013-01-19 MED ORDER — HYDROMORPHONE HCL PF 1 MG/ML IJ SOLN
0.5000 mg | INTRAMUSCULAR | Status: AC | PRN
  Administered 2013-01-19 (×2): 1 mg via INTRAVENOUS
  Filled 2013-01-19 (×2): qty 1

## 2013-01-19 MED ORDER — TRIAMTERENE-HCTZ 37.5-25 MG PO TABS
1.0000 | ORAL_TABLET | Freq: Every day | ORAL | Status: DC
  Administered 2013-01-20 – 2013-01-23 (×4): 1 via ORAL
  Filled 2013-01-19 (×5): qty 1

## 2013-01-19 MED ORDER — ADULT MULTIVITAMIN W/MINERALS CH
1.0000 | ORAL_TABLET | Freq: Every day | ORAL | Status: DC
  Administered 2013-01-20 – 2013-01-23 (×4): 1 via ORAL
  Filled 2013-01-19 (×5): qty 1

## 2013-01-19 MED ORDER — DIAZEPAM 5 MG PO TABS: 5.0000 mg | ORAL_TABLET | Freq: Three times a day (TID) | ORAL | Status: DC | PRN

## 2013-01-19 MED ORDER — DOXAZOSIN MESYLATE 4 MG PO TABS
4.0000 mg | ORAL_TABLET | Freq: Every day | ORAL | Status: DC
  Administered 2013-01-20 – 2013-01-22 (×3): 4 mg via ORAL
  Filled 2013-01-19 (×5): qty 1

## 2013-01-19 MED ORDER — FLUTTER DEVI: Freq: Two times a day (BID) | Status: DC

## 2013-01-19 MED ORDER — BUPROPION HCL ER (SR) 100 MG PO TB12
200.0000 mg | ORAL_TABLET | Freq: Two times a day (BID) | ORAL | Status: DC
  Administered 2013-01-19 – 2013-01-23 (×8): 200 mg via ORAL
  Filled 2013-01-19 (×9): qty 2

## 2013-01-19 MED ORDER — CALCIUM CARBONATE 1250 (500 CA) MG PO TABS
1.0000 | ORAL_TABLET | Freq: Two times a day (BID) | ORAL | Status: DC
  Administered 2013-01-19 – 2013-01-23 (×8): 500 mg via ORAL
  Filled 2013-01-19 (×11): qty 1

## 2013-01-19 MED ORDER — MECLIZINE HCL 25 MG PO TABS
25.0000 mg | ORAL_TABLET | Freq: Every day | ORAL | Status: DC | PRN
  Filled 2013-01-19: qty 1

## 2013-01-19 MED ORDER — ALBUTEROL SULFATE HFA 108 (90 BASE) MCG/ACT IN AERS: 2.0000 | INHALATION_SPRAY | Freq: Four times a day (QID) | RESPIRATORY_TRACT | Status: DC | PRN

## 2013-01-19 MED ORDER — ROPINIROLE HCL 1 MG PO TABS
1.0000 mg | ORAL_TABLET | Freq: Every day | ORAL | Status: DC
  Administered 2013-01-19 – 2013-01-22 (×4): 1 mg via ORAL
  Filled 2013-01-19 (×5): qty 1

## 2013-01-19 MED ORDER — ALBUTEROL SULFATE HFA 108 (90 BASE) MCG/ACT IN AERS
2.0000 | INHALATION_SPRAY | Freq: Four times a day (QID) | RESPIRATORY_TRACT | Status: DC | PRN
  Filled 2013-01-19: qty 6.7

## 2013-01-19 MED ORDER — GUAIFENESIN ER 600 MG PO TB12
1200.0000 mg | ORAL_TABLET | Freq: Every day | ORAL | Status: DC
  Administered 2013-01-19 – 2013-01-23 (×5): 1200 mg via ORAL
  Filled 2013-01-19 (×5): qty 2

## 2013-01-19 MED ORDER — FLUTICASONE PROPIONATE 50 MCG/ACT NA SUSP
2.0000 | Freq: Two times a day (BID) | NASAL | Status: DC
  Administered 2013-01-19 – 2013-01-23 (×7): 2 via NASAL
  Filled 2013-01-19: qty 16

## 2013-01-19 MED ORDER — CALCIUM CARBONATE 600 MG PO TABS: 600.0000 mg | ORAL_TABLET | Freq: Every day | ORAL | Status: DC

## 2013-01-19 MED ORDER — ONDANSETRON HCL 4 MG/2ML IJ SOLN
4.0000 mg | Freq: Three times a day (TID) | INTRAMUSCULAR | Status: AC | PRN
  Administered 2013-01-19: 4 mg via INTRAVENOUS
  Filled 2013-01-19: qty 2

## 2013-01-19 MED ORDER — LORATADINE 10 MG PO TABS
10.0000 mg | ORAL_TABLET | Freq: Every day | ORAL | Status: DC
  Administered 2013-01-19 – 2013-01-22 (×4): 10 mg via ORAL
  Filled 2013-01-19 (×5): qty 1

## 2013-01-19 MED ORDER — TRIAMTERENE-HCTZ 75-50 MG PO TABS: 0.5000 | ORAL_TABLET | Freq: Every day | ORAL | Status: DC

## 2013-01-19 MED ORDER — CHLORPHENIRAMINE TANNATE 12 MG PO TABS: 12.0000 mg | ORAL_TABLET | Freq: Every day | ORAL | Status: DC

## 2013-01-19 MED ORDER — GUAIFENESIN ER 1200 MG PO TB12: 1200.0000 mg | ORAL_TABLET | Freq: Every day | ORAL | Status: DC

## 2013-01-19 NOTE — Progress Notes (Signed)
79 yo pt s/p fall 01/16/2013 Sent home from er 01/16/2013 with sacral and rami fxs left Intractable pain and inability to ambulate Pain with rom left leg Ct head neg - mri hip and pelvis shows nondisplaced rami ad sacral ala fx Plan snf - PT for tdwb lle as tolerated - has greenfield filter Full consult to follow

## 2013-01-19 NOTE — ED Provider Notes (Signed)
History    CSN: [id number] Arrival date & time 01/19/13  1404  First MD Initiated Contact with Patient 01/19/13 1425     Chief Complaint  Patient presents with  . Fall   HPI Micheal Frank is a 79 y.o. male with an extensive medical history, and was seen recently on 01/16/2013 after a fall where he sustained left inferior and superior rami fractures as well as a sacral ala fracture. Patient was released to home, however patient has been unable to ambulate and is having considerable difficulty at home secondary to pain control and inability to ambulate.  If the patient is not moving he says his pain is well-controlled with Percocet and is 1/10, sharp, mostly located in the left hip.  If the patient moves, the pain becomes excruciating.  Patient denies any subsequent falls, chest pain, shortness of breath is not worse than usual (baseline COPD) no hemoptysis, no leg pain or leg swelling, no abdominal pain, nausea vomiting or diarrhea. Patient denies any fevers or chills.  Past Medical History  Diagnosis Date  . Subdural hematoma 03/30/11  . Epidural abscess   . Staphylococcus aureus bacteremia with sepsis   . Prosthetic joint infection   . Hyperlipidemia   . History of blood clots   . Blood dyscrasia     pt CAN NOT have any blood thinners d/t hx brain bleed  . S/P right heart catheterization     at least 35yrs  . Asthma   . COPD (chronic obstructive pulmonary disease)     emphysema  . Shortness of breath     with exertion  . Pneumonia     as a child and in 1952;had a pneumonia vaccine couple of years ago  . Peripheral neuropathy   . Short-term memory loss   . Arthritis     left leg also has swelling  . Gastric ulcer   . Constipation     miralax prn  . Depression     takes wellbutrin bid  . Urinary frequency     wears depends  . Complication of anesthesia     confusion with anesthesia  . Sleep apnea     has CPAP but doesn't use it;sleep study done at least 27yrs ago   Past  Surgical History  Procedure Laterality Date  . Hernia repair      25+yrs ago  . External ear surgery      shunt placed in right ear d/t mennieres   . Total knee arthroplasty      x 2 left knee 2003/2012  . Filtering procedure      filter placed in neck d/t blood  clot  . Eye surgery      bil cataract surgery  . Skin biopsy      melanoma on head   . Knee arthroscopy    . Total knee revision  05/23/2011    Procedure: TOTAL KNEE REVISION;  Surgeon: [name];  Location: [facility] OR;  Service: Orthopedics;  Laterality: Left;  LEFT KNEE REMOVAL OF SPACER, POSSIBLE REIMPLANTATION OF REVISION TKA VERSES REPLACEMENT ANTIBIOTIC SPACER  . Epidural abscess drainage  12/2010   History reviewed. No pertinent family history. History  Substance Use Topics  . Smoking status: Former Smoker -- 2.00 packs/day for 40 years    Types: Cigarettes    Quit date: 07/17/1978  . Smokeless tobacco: Never Used  . Alcohol Use: No    Review of Systems At least 10pt or greater review of  systems completed and are negative except where specified in the HPI.  Allergies  Anticoagulant compound  Home Medications   Current Outpatient Rx  Name  Route  Sig  Dispense  Refill  . albuterol (PROAIR HFA) 108 (90 BASE) MCG/ACT inhaler   Inhalation   Inhale 2 puffs into the lungs every 6 (six) hours as needed for wheezing.   1 Inhaler   4   . buPROPion (WELLBUTRIN SR) 100 MG 12 hr tablet   Oral   Take 200 mg by mouth 2 (two) times daily.          . calcium carbonate (OS-CAL) 600 MG TABS   Oral   Take 600 mg by mouth daily.          . cephALEXin (KEFLEX) 500 MG capsule   Oral   Take 1 capsule (500 mg total) by mouth 4 (four) times daily.   28 capsule   0   . Chlorpheniramine Tannate 12 MG TABS   Oral   Take 12 mg by mouth at bedtime.         Marland Kitchen Dextromethorphan-Guaifenesin (TUSSIN DM) 10-100 MG/5ML liquid   Oral   Take 10 mLs by mouth daily as needed (cough).          . diazepam (VALIUM) 5  MG tablet   Oral   Take 5 mg by mouth every 8 (eight) hours as needed for anxiety.         Marland Kitchen doxazosin (CARDURA) 4 MG tablet   Oral   Take 4 mg by mouth at bedtime.         . fluticasone (FLONASE) 50 MCG/ACT nasal spray   Nasal   Place 2 sprays into the nose 2 (two) times daily.   16 g   6   . Guaifenesin (MUCINEX MAXIMUM STRENGTH) 1200 MG TB12   Oral   Take 1,200 mg by mouth daily.         Marland Kitchen HYDROcodone-acetaminophen (NORCO/VICODIN) 5-325 MG per tablet   Oral   Take 1 tablet by mouth at bedtime as needed. For pain         . HYDROcodone-homatropine (HYCODAN) 5-1.5 MG/5ML syrup   Oral   Take 5 mLs by mouth every 6 (six) hours as needed for cough.         . meclizine (ANTIVERT) 25 MG tablet   Oral   Take 25 mg by mouth daily as needed (vertigo).          . Multiple Vitamin (MULTIVITAMIN WITH MINERALS) TABS   Oral   Take 1 tablet by mouth daily.         Marland Kitchen oxycodone (OXY-IR) 5 MG capsule   Oral   Take 5-10 mg by mouth every 4 (four) hours as needed for pain.         Marland Kitchen Respiratory Therapy Supplies (FLUTTER) DEVI      Use 3 times daily   1 each   0   . rOPINIRole (REQUIP) 1 MG tablet   Oral   Take 1 mg by mouth at bedtime.          . simvastatin (ZOCOR) 40 MG tablet   Oral   Take 40 mg by mouth at bedtime.          . triamterene-hydrochlorothiazide (MAXZIDE) 75-50 MG per tablet   Oral   Take 0.5 tablets by mouth daily.          BP 121/66  Pulse 76  Temp(Src) 97.5 F (  36.4 C) (Oral)  Resp 22  Ht 5\' 9"  (1.753 m)  Wt 161 lb (73.029 kg)  BMI 23.76 kg/m2  SpO2 96% Physical Exam  Nursing notes reviewed.  Electronic medical record reviewed. VITAL SIGNS:   Filed Vitals:   01/19/13 1404 01/19/13 1500  BP: 155/79 121/66  Pulse: 79 76  Temp: 97.5 F (36.4 C)   TempSrc: Oral   Resp: 22   Height: 5\' 9"  (1.753 m)   Weight: 161 lb (73.029 kg)   SpO2: 88% 96%   CONSTITUTIONAL: Awake, oriented, appears non-toxic HENT: Atraumatic,  normocephalic, oral mucosa pink and moist, airway patent. Nares patent without drainage. External ears normal. EYES: Conjunctiva clear, EOMI, PERRLA NECK: Trachea midline, non-tender, supple CARDIOVASCULAR: Normal heart rate, Normal rhythm, No murmurs, rubs, gallops PULMONARY/CHEST: Clear to auscultation, no rhonchi, wheezes, or rales. Symmetrical breath sounds. Non-tender. ABDOMINAL: Non-distended, soft, non-tender - no rebound or guarding.  BS normal. NEUROLOGIC: Non-focal, moving all four extremities, no gross sensory or motor deficits. EXTREMITIES: No clubbing, cyanosis, or edema. Patient can dorsiflex and plantar flex bilateral feet, including hallux, no sensory deficits in the lower extremities. Patient is unable to cannulate secondary to pain the left hip. Patient does have some pain over the left trochanteric bursa. Patient has pain over the left side of the pelvis. SKIN: Warm, Dry, No erythema, No rash  ED Course  Procedures (including critical care time) Labs Reviewed  CBC WITH DIFFERENTIAL - Abnormal; Notable for the following:    WBC 11.8 (*)    Platelets 133 (*)    Neutro Abs 9.1 (*)    Lymphocytes Relative 9 (*)    Monocytes Relative 13 (*)    Monocytes Absolute 1.5 (*)    All other components within normal limits  BASIC METABOLIC PANEL - Abnormal; Notable for the following:    BUN 24 (*)    Creatinine, Ser 1.38 (*)    GFR calc non Af Amer 45 (*)    GFR calc Af Amer 52 (*)    All other components within normal limits   Date: 01/19/2013  Rate: 67  Rhythm: normal sinus rhythm  QRS Axis: normal  Intervals: normal  ST/T Wave abnormalities: normal  Conduction Disutrbances: Right bundle branch block  Narrative Interpretation: No significant changes when compared with prior EKG from 04/26/2011, nonischemic EKG   Dg Chest Port 1 View  01/19/2013   *RADIOLOGY REPORT*  Clinical Data: Emphysema.  PORTABLE CHEST - 1 VIEW  Comparison: 01/16/2013 and 04/22/2012 and a chest CT  dated 11/21/2006  Findings: Heart size is normal.  Main pulmonary arteries are slightly prominent with peripheral pruning of the vessels suggesting pulmonary arterial hypertension.  Chronic obstructive and interstitial changes are stable.  No acute infiltrates or effusions.  No acute osseous abnormality.  Soft tissue density medially at the left lung apex is felt to represent a dilated nerve root sleeve and is unchanged since 11/21/2006.  IMPRESSION: No acute abnormality.  Emphysema.   Original Report Authenticated By: Francene Boyers, M.D.   1. Fracture of multiple pubic rami, left, sequela   2. Sacral fracture, closed, sequela   3. Chronic airway obstruction, not elsewhere classified   4. Restless legs syndrome (RLS)   5. Unspecified sleep apnea     MDM  Micheal Frank is a 79 y.o. male wrist fractures of the left superior-inferior pubic rami, as well as a sacral ala fracture, patient has been trying to manage his fractures at home however he and his daughter have  been unable to control his pain he's been unable to transfer, Is having significant difficulty at home. Patient called his orthopedic surgeon [name], and was told to come to the emergency department.  Reason patient's age, severe pain inability to perform ADLs patient will require admission - discussed with hospitalist for admission.    Jones Skene, MD 01/21/13 [phone]

## 2013-01-19 NOTE — H&P (Signed)
[facility] History and Physical  Micheal Frank UJW:[id number] DOB: 1926-02-13 DOA: 01/19/2013  Referring physician: PCP: Micheal Bos, MD  Specialists: Dr. August Frank (orthopedic surgeon)  Chief Complaint: Lt Hip pain PO medication Micheal Frank is a 79 y.o.WM PMHx RLS, neuropathy, OSA (not requiring CPAP), history of asbestos exposure, COPD, SDH (see head CT 03/25/2011), per patient DVT left leg as complication of infected TKA. On 01/16/2013 after a fall where he sustained left inferior and superior rami fractures as well as a sacral ala fracture. Patient was released to home, however patient has been unable to ambulate and is having considerable difficulty at home secondary to pain control and inability to ambulate. If the patient is not moving he says his pain is well-controlled with Percocet and is 1/10, sharp, mostly located in the left hip. If the patient moves, the pain becomes excruciating (had been attempting to use walker/wheelchair per Dr. Diamantina Providence his orthopedic surgeons orders). Patient denies any subsequent falls, chest pain, shortness of breath is not worse than usual (baseline COPD) no hemoptysis, no leg pain or leg swelling, no abdominal pain, nausea vomiting or diarrhea. Patient denies any fevers or chills.   Head CT without contrast 01/16/2013:  No evidence of acute intracranial abnormality.  Stable right frontal and bilateral posterior fossa hygromas.  Moderate atrophy with small vessel ischemic changes and  intracranial atherosclerosis.  MRI left hip 01/16/2013:  1. The study is positive for acute, nondisplaced fractures of the  high left superior pubic ramus and left inferior pubic ramus.  There is also a left sacral ala fracture which appears acute.  2. Bilateral trochanteric bursitis, more notable on the right.  3. Enlarged prostate gland.  4. Diverticulosis. Head CT without contrast 03/25/2011 Acute on top of chronic right-sided subdural hematoma with increase   in mass effect            Review of Systems: The patient denies anorexia, fever, weight loss,, vision loss, decreased hearing, hoarseness, chest pain, syncope, dyspnea on exertion (at baseline), peripheral edema, balance deficits (increased sats CVA), hemoptysis, abdominal pain, melena, hematochezia, severe indigestion/heartburn, hematuria, incontinence, genital sores, muscle weakness, suspicious skin lesions, transient blindness, difficulty walking, depression, unusual weight change, abnormal bleeding, enlarged lymph nodes, angioedema, and breast masses.    Past Medical History  Diagnosis Date  . Subdural hematoma 03/30/11  . Epidural abscess   . Staphylococcus aureus bacteremia with sepsis   . Prosthetic joint infection   . Hyperlipidemia   . History of blood clots   . Blood dyscrasia     pt CAN NOT have any blood thinners d/t hx brain bleed  . S/P right heart catheterization     at least 55yrs  . Asthma   . COPD (chronic obstructive pulmonary disease)     emphysema  . Shortness of breath     with exertion  . Pneumonia     as a child and in 1952;had a pneumonia vaccine couple of years ago  . Peripheral neuropathy   . Short-term memory loss   . Arthritis     left leg also has swelling  . Gastric ulcer   . Constipation     miralax prn  . Depression     takes wellbutrin bid  . Urinary frequency     wears depends  . Complication of anesthesia     confusion with anesthesia  . Sleep apnea     has CPAP but doesn't use it;sleep study done at least 23yrs ago  Past Surgical History  Procedure Laterality Date  . Hernia repair      25+yrs ago  . External ear surgery      shunt placed in right ear d/t mennieres   . Total knee arthroplasty      x 2 left knee 2003/2012  . Filtering procedure      filter placed in neck d/t blood  clot  . Eye surgery      bil cataract surgery  . Skin biopsy      melanoma on head   . Knee arthroscopy    . Total knee revision  05/23/2011     Procedure: TOTAL KNEE REVISION;  Surgeon: [name];  Location: [facility] OR;  Service: Orthopedics;  Laterality: Left;  LEFT KNEE REMOVAL OF SPACER, POSSIBLE REIMPLANTATION OF REVISION TKA VERSES REPLACEMENT ANTIBIOTIC SPACER  . Epidural abscess drainage  12/2010   Social History:  reports that he quit smoking about 34 years ago. His smoking use included Cigarettes. He has a 80 pack-year smoking history. He has never used smokeless tobacco. He reports that he does not drink alcohol or use illicit drugs.  where does patient live--home, at home with wife  Can patient participate in ADLs?  Allergies  Allergen Reactions  . Anticoagulant Compound     History of bleeding on the brain per daughter    History reviewed. No pertinent family history.   Prior to Admission medications   Medication Sig Start Date End Date Taking? Authorizing Provider  albuterol (PROAIR HFA) 108 (90 BASE) MCG/ACT inhaler Inhale 2 puffs into the lungs every 6 (six) hours as needed for wheezing. 09/09/12  Yes Storm Frisk, MD  buPROPion [facility] SR) 100 MG 12 hr tablet Take 200 mg by mouth 2 (two) times daily.    Yes Historical Provider, MD  calcium carbonate (OS-CAL) 600 MG TABS Take 600 mg by mouth daily.    Yes Historical Provider, MD  cephALEXin (KEFLEX) 500 MG capsule Take 1 capsule (500 mg total) by mouth 4 (four) times daily. 01/16/13  Yes Flint Melter, MD  Chlorpheniramine Tannate 12 MG TABS Take 12 mg by mouth at bedtime.   Yes Historical Provider, MD  Dextromethorphan-Guaifenesin (TUSSIN DM) 10-100 MG/5ML liquid Take 10 mLs by mouth daily as needed (cough).    Yes Historical Provider, MD  diazepam (VALIUM) 5 MG tablet Take 5 mg by mouth every 8 (eight) hours as needed for anxiety.   Yes Historical Provider, MD  doxazosin (CARDURA) 4 MG tablet Take 4 mg by mouth at bedtime.   Yes Historical Provider, MD  fluticasone (FLONASE) 50 MCG/ACT nasal spray Place 2 sprays into the nose 2 (two) times daily.  08/08/12  Yes Storm Frisk, MD  Guaifenesin [facility] MAXIMUM STRENGTH) 1200 MG TB12 Take 1,200 mg by mouth daily.   Yes Historical Provider, MD  HYDROcodone-acetaminophen (NORCO/VICODIN) 5-325 MG per tablet Take 1 tablet by mouth at bedtime as needed. For pain 05/29/11  Yes [name], MD  HYDROcodone-homatropine [facility]) 5-1.5 MG/5ML syrup Take 5 mLs by mouth every 6 (six) hours as needed for cough.   Yes Historical Provider, MD  meclizine (ANTIVERT) 25 MG tablet Take 25 mg by mouth daily as needed (vertigo).    Yes Historical Provider, MD  Multiple Vitamin (MULTIVITAMIN WITH MINERALS) TABS Take 1 tablet by mouth daily.   Yes Historical Provider, MD  oxycodone (OXY-IR) 5 MG capsule Take 5-10 mg by mouth every 4 (four) hours as needed for pain.  Yes Historical Provider, MD  Respiratory Therapy Supplies (FLUTTER) DEVI Use 3 times daily 11/28/12  Yes Storm Frisk, MD  rOPINIRole (REQUIP) 1 MG tablet Take 1 mg by mouth at bedtime.    Yes Historical Provider, MD  simvastatin (ZOCOR) 40 MG tablet Take 40 mg by mouth at bedtime.    Yes Historical Provider, MD  triamterene-hydrochlorothiazide (MAXZIDE) 75-50 MG per tablet Take 0.5 tablets by mouth daily.   Yes Historical Provider, MD   Physical Exam: Filed Vitals:   01/19/13 1500 01/19/13 1630 01/19/13 1730 01/19/13 1832  BP: 121/66 136/60 129/40 146/61  Pulse: 76 70 85 80  Temp:    99.3 F (37.4 C)  TempSrc:    Oral  Resp:  20 18 20   Height:      Weight:      SpO2: 96% 96% 98% 93%     General: Alert,NAD when lying still  Cardiovascular: Regular in rate, negative murmurs rubs gallops, DP/PT pulses 2+ bilateral  Respiratory: Clear to auscultation  Abdomen: Soft, nontender, plus bowel sounds  Musculoskeletal: Left leg extremely painful and moved passively or actively secondary to fracture with remainder of his limbs without pain  Neurologic: Alert and oriented x4, cranial nerves II through XII are intact did not ambulate  the patient secondary to left hip fracture  Labs on Admission:  Basic Metabolic Panel:  Recent Labs Lab 01/16/13 2152 01/19/13 1514  NA 139 137  K 3.4* 3.8  CL 102 101  CO2 29 28  GLUCOSE 108* 78  BUN 18 24*  CREATININE 1.36* 1.38*  CALCIUM 9.0 8.8   Liver Function Tests: No results found for this basename: AST, ALT, ALKPHOS, BILITOT, PROT, ALBUMIN,  in the last 168 hours No results found for this basename: LIPASE, AMYLASE,  in the last 168 hours No results found for this basename: AMMONIA,  in the last 168 hours CBC:  Recent Labs Lab 01/16/13 2152 01/19/13 1514  WBC 13.9* 11.8*  NEUTROABS 11.5* 9.1*  HGB 13.0 13.4  HCT 39.6 40.6  MCV 92.1 92.3  PLT 169 133*   Cardiac Enzymes: No results found for this basename: CKTOTAL, CKMB, CKMBINDEX, TROPONINI,  in the last 168 hours  BNP (last 3 results) No results found for this basename: PROBNP,  in the last 8760 hours CBG: No results found for this basename: GLUCAP,  in the last 168 hours  Radiological Exams on Admission: Dg Chest Port 1 View  01/19/2013   *RADIOLOGY REPORT*  Clinical Data: Emphysema.  PORTABLE CHEST - 1 VIEW  Comparison: 01/16/2013 and 04/22/2012 and a chest CT dated 11/21/2006  Findings: Heart size is normal.  Main pulmonary arteries are slightly prominent with peripheral pruning of the vessels suggesting pulmonary arterial hypertension.  Chronic obstructive and interstitial changes are stable.  No acute infiltrates or effusions.  No acute osseous abnormality.  Soft tissue density medially at the left lung apex is felt to represent a dilated nerve root sleeve and is unchanged since 11/21/2006.  IMPRESSION: No acute abnormality.  Emphysema.   Original Report Authenticated By: Francene Boyers, M.D.    EKG: Normal sinus rhythm,RBBB, poor quality EKG  Assessment/Plan Principal Problem:   Pain in limb Active Problems:   RESTLESS LEG SYNDROME   NEUROPATHY   COPD gold stage C.   SLEEP APNEA   HISTORY OF  ASBESTOS EXPOSURE   1. Left hip fracture; have spoken with Dr. August Frank (orthopedic surgeon) he will handle all pain medications. Request that we have a patient's other  medical problems. Obtain PT/OT evaluation obtain CIR evaluation 2. COPD; place on home meds or their equivalent 3. OSA; does not use CPAP at home if patient decompensates requires nighttime CPAP we'll start at that time 4. Neuropathy; will place patient on home medication for its equivalent.  5. RLS; place patient on home medication or its equivalent   Code Status:  Will make patient FULL CODE for this evening, secondary to there being some confusion amongst  the children.  Family Communication: Treatment plan discussed with daughter Disposition Plan:    Time spent: 79 minute  Drema Dallas [facility] Pager [phone]  If 7PM-7AM, please contact night-coverage [email or web address] Password [email or web address] 01/19/2013, 7:11 PM

## 2013-01-19 NOTE — ED Notes (Signed)
Pt from home, c/o hip pain uncontrolled from fall on thurs. Was evaluated on thurs. States inability to ambulate at home. Pain 4/10, increase with slight movement.

## 2013-01-20 DIAGNOSIS — N183 Chronic kidney disease, stage 3 unspecified: Secondary | ICD-10-CM

## 2013-01-20 DIAGNOSIS — S329XXA Fracture of unspecified parts of lumbosacral spine and pelvis, initial encounter for closed fracture: Secondary | ICD-10-CM

## 2013-01-20 HISTORY — DX: Chronic kidney disease, stage 3 unspecified: N18.30

## 2013-01-20 MED ORDER — HYDROCODONE-ACETAMINOPHEN 5-325 MG PO TABS
1.0000 | ORAL_TABLET | Freq: Four times a day (QID) | ORAL | Status: DC | PRN
  Administered 2013-01-20 – 2013-01-23 (×12): 2 via ORAL
  Filled 2013-01-20 (×12): qty 2

## 2013-01-20 NOTE — Evaluation (Signed)
Physical Therapy Evaluation Patient Details Name: Micheal Frank MRN: [id number] DOB: [birth year] Today's Date: 01/20/2013 Time: 4098-1191 PT Time Calculation (min): 30 min  PT Assessment / Plan / Recommendation History of Present Illness  79 yo pt s/p fall 01/16/2013 L nondisplaced pubic ramus and sacral ala fxs; TDWB LLE Sent home from ER; now readmitted with intractable pain and inability to walk or manage at home  Clinical Impression  Pt currently with functional limitations due to the deficits listed below (see PT Problem List).  Pt will benefit from skilled PT to increase their independence and safety with mobility to allow discharge to the venue listed below.       PT Assessment  Patient needs continued PT services    Follow Up Recommendations  CIR    Does the patient have the potential to tolerate intense rehabilitation      Barriers to Discharge        Equipment Recommendations  Rolling walker with 5" wheels;3in1 (PT);Wheelchair (measurements PT)    Recommendations for Other Services Rehab consult   Frequency Min 5X/week    Precautions / Restrictions Precautions Precautions: Fall Restrictions Weight Bearing Restrictions: Yes   Pertinent Vitals/Pain 9/10 with movement L or RLE; RN notified patient repositioned for comfort       Mobility  Bed Mobility Bed Mobility: Supine to Sit;Sit to Supine Supine to Sit: 1: +2 Total assist Supine to Sit: Patient Percentage: 10% Sit to Supine: 1: +2 Total assist Sit to Supine: Patient Percentage: 10% Details for Bed Mobility Assistance: Cues for technique; Extreme pain with motion of R and LLEs; Extensive physical assist to elevate trunk from bed Transfers Transfers: Sit to Stand;Stand to Sit;Stand Pivot Transfers Sit to Stand: 1: +1 Total assist;From bed Stand to Sit: 1: +1 Total assist;To bed Details for Transfer Assistance: Extreme pain with any standing acitivity, even with being careful to stay TDWB only LLE; Stood  briefly to assist with hygeine as pt had voided in the bed; Required knee blocking; Unable to acheive fully upright standing; Attempted pivot to chair, but pt in too much pain    Exercises     PT Diagnosis: Acute pain;Difficulty walking  PT Problem List: Decreased strength;Decreased range of motion;Decreased activity tolerance;Decreased balance;Decreased mobility;Decreased knowledge of use of DME;Decreased knowledge of precautions;Pain PT Treatment Interventions: DME instruction;Gait training;Functional mobility training;Therapeutic activities;Therapeutic exercise;Balance training;Patient/family education;Wheelchair mobility training     PT Goals(Current goals can be found in the care plan section) Acute Rehab PT Goals Patient Stated Goal: less pain PT Goal Formulation: With patient Time For Goal Achievement: 02/03/13 Potential to Achieve Goals: Good  Visit Information  Last PT Received On: 01/20/13 Assistance Needed: +2 PT/OT Co-Evaluation/Treatment: Yes History of Present Illness: 79 yo pt s/p fall 01/16/2013       Prior Functioning  Home Living Family/patient expects to be discharged to:: Private residence Living Arrangements: Spouse/significant other;Children Available Help at Discharge: Family Type of Home: House Home Access: Ramped entrance Home Layout: Two level;Able to live on main level with bedroom/bathroom Home Equipment: Dan Humphreys - 2 wheels;Tub bench Prior Function Level of Independence: Independent Communication Communication: HOH    Cognition  Cognition Arousal/Alertness: Awake/alert Behavior During Therapy: WFL for tasks assessed/performed    Extremity/Trunk Assessment Upper Extremity Assessment Upper Extremity Assessment: Overall WFL for tasks assessed (reports he broke his left shoulder previously)   Balance Balance Balance Assessed: Yes Static Sitting Balance Static Sitting - Balance Support: Right upper extremity supported;Left upper extremity  supported Static Sitting - Level  of Assistance: 3: Mod assist;4: Min assist Static Sitting - Comment/# of Minutes: Heavy mod assist initially upon getting to sit as pt had an extreme antalgic Right lean  End of Session PT - End of Session Equipment Utilized During Treatment: Gait belt Activity Tolerance: Patient limited by pain Patient left: in bed;with call bell/phone within reach (preparing for lunch) Nurse Communication: Mobility status  GP     Van Clines [facility] [place], [place] [phone]  01/20/2013, 3:58 PM

## 2013-01-20 NOTE — Evaluation (Signed)
Occupational Therapy Evaluation Patient Details Name: Micheal Frank MRN: [id number] DOB: [birth year] Today's Date: 01/20/2013 Time: 4401-0272 OT Time Calculation (min): 22 min  OT Assessment / Plan / Recommendation History of present illness 79 yo pt s/p fall 01/16/2013 L nondisplaced pubic ramus and sacral ala fxs; TDWB LLE  Sent home from ER; now readmitted with intractable pain and inability to walk or manage at home     Clinical Impression   Pt presents with below problem list. Pt will benefit from skilled OT to increase their independence and safety to allow discharge to the venue listed below.      OT Assessment  Patient needs continued OT Services    Follow Up Recommendations  CIR    Barriers to Discharge      Equipment Recommendations  Other (comment) (tbd)    Recommendations for Other Services Rehab consult  Frequency  Min 2X/week    Precautions / Restrictions Precautions Precautions: Fall Restrictions Weight Bearing Restrictions: Yes   Pertinent Vitals/Pain 9/10 with movement L or RLE; RN notified  patient repositioned for comfort     ADL  Eating/Feeding: Independent Where Assessed - Eating/Feeding: Bed level Grooming: Set up Where Assessed - Grooming: Supine, head of bed up Upper Body Bathing: Set up Where Assessed - Upper Body Bathing: Supine, head of bed up Lower Body Bathing: +2 Total assistance Lower Body Bathing: Patient Percentage: 10% Where Assessed - Lower Body Bathing: Supported sit to stand Upper Body Dressing: Set up Where Assessed - Upper Body Dressing: Supine, head of bed up Lower Body Dressing: +2 Total assistance Lower Body Dressing: Patient Percentage: 0% Where Assessed - Lower Body Dressing: Supported sit to stand Toilet Transfer: Simulated;+1 Total assistance Toilet Transfer Method: Sit to Barista: Other (comment) (from bed) Toileting - Clothing Manipulation and Hygiene: +2 Total assistance Toileting -  Clothing Manipulation and Hygiene: Patient Percentage: 0% Where Assessed - Toileting Clothing Manipulation and Hygiene: Other (comment) (sit to stand from bed) Tub/Shower Transfer Method: Not assessed Equipment Used: Gait belt Transfers/Ambulation Related to ADLs: +1 Total A for sit to stand transfer.  ADL Comments: Pt had urinated in bed upon therapy's arrival. Total A for hygiene. Pt overal Total A for LB ADLs.  Pt limited by pain.    OT Diagnosis: Acute pain  OT Problem List: Decreased strength;Decreased range of motion;Decreased activity tolerance;Impaired balance (sitting and/or standing);Decreased knowledge of use of DME or AE;Decreased knowledge of precautions;Pain OT Treatment Interventions: Self-care/ADL training;DME and/or AE instruction;Therapeutic activities;Patient/family education;Balance training   OT Goals(Current goals can be found in the care plan section) Acute Rehab OT Goals Patient Stated Goal: less pain OT Goal Formulation: With patient Time For Goal Achievement: 01/27/13 Potential to Achieve Goals: Good ADL Goals Pt Will Perform Grooming: standing;with supervision Pt Will Perform Lower Body Bathing: with min guard assist;sit to/from stand Pt Will Perform Lower Body Dressing: with min guard assist;sit to/from stand Pt Will Transfer to Toilet: with supervision;ambulating;bedside commode Pt Will Perform Toileting - Clothing Manipulation and hygiene: with min guard assist;sit to/from stand  Visit Information  Last OT Received On: 01/20/13 Assistance Needed: +2 PT/OT Co-Evaluation/Treatment: Yes History of Present Illness: 79 yo pt s/p fall 01/16/2013       Prior Functioning     Home Living Family/patient expects to be discharged to:: Private residence Living Arrangements: Spouse/significant other;Children Available Help at Discharge: Family Type of Home: House Home Access: Ramped entrance Home Layout: Two level;Able to live on main level with  bedroom/bathroom Home  Equipment: Dan Humphreys - 2 wheels;Tub bench Prior Function Level of Independence: Independent Communication Communication: HOH         Vision/Perception     Cognition  Cognition Arousal/Alertness: Awake/alert Behavior During Therapy: WFL for tasks assessed/performed    Extremity/Trunk Assessment Upper Extremity Assessment Upper Extremity Assessment: Overall WFL for tasks assessed (reports he broke his left shoulder previously)     Mobility Bed Mobility Bed Mobility: Supine to Sit;Sit to Supine Supine to Sit: 1: +2 Total assist Supine to Sit: Patient Percentage: 10% Sit to Supine: 1: +2 Total assist Sit to Supine: Patient Percentage: 10% Details for Bed Mobility Assistance: Cues for technique; Extreme pain with motion of R and LLEs; Extensive physical assist to elevate trunk from bed Transfers Transfers: Sit to Stand;Stand to Sit Sit to Stand: 1: +1 Total assist;From bed Stand to Sit: 1: +1 Total assist;To bed Details for Transfer Assistance: Extreme pain with any standing acitivity, even with being careful to stay TDWB only LLE; Stood briefly to assist with hygeine as pt had voided in the bed; Required knee blocking; Unable to acheive fully upright standing; Attempted pivot to chair, but pt in too much pain     Exercise        End of Session OT - End of Session Equipment Utilized During Treatment: Gait belt Activity Tolerance: Patient limited by pain Patient left: in bed;with call bell/phone within reach Nurse Communication: Mobility status  GO     Earlie Raveling OTR/L [phone] 01/20/2013, 4:24 PM

## 2013-01-20 NOTE — Progress Notes (Signed)
Thank you for consult on Mr. Micheal Frank. Chart reviewed and note that he was seen in ED 01/16/13 for pelvic fracture and made TDWB. He has had difficulty managing at home due to pain and weight bearing limitations will further affect his overall goals and ability to tolerate intensive therapies. PT/OT evaluations needed to determine ability to tolerate therapy and overall goals.

## 2013-01-20 NOTE — Progress Notes (Signed)
Pt stable Pt today snf pending

## 2013-01-20 NOTE — Progress Notes (Signed)
Assessment/Plan: Principal Problem:   Pelvic fracture - stable so far. I doubt he is CIR candidate as he can't really get out of bed at all and does not have multiple needs. However, will see what they say. Will ask SW to get started with placement. He is ready to transfer to SNF/CIR when bed is available.  Active Problems:   RESTLESS LEG SYNDROME   NEUROPATHY   COPD gold stage C.   SLEEP APNEA   HISTORY OF ASBESTOS EXPOSURE   Chronic kidney disease, stage III (moderate)   Subjective: Feels okay today. No real changes or issues. COPD is stable.   Objective:  Vital Signs: Filed Vitals:   01/19/13 1730 01/19/13 1832 01/19/13 2222 01/20/13 0406  BP: 129/40 146/61 101/49 116/54  Pulse: 85 80 77 69  Temp:  99.3 F (37.4 C) 99.1 F (37.3 C) 98.7 F (37.1 C)  TempSrc:  Oral Oral Oral  Resp: 18 20 18 18   Height:      Weight:      SpO2: 98% 93% 100% 98%     EXAM: Alert. Comfortable.    Intake/Output Summary (Last 24 hours) at 01/20/13 0755 Last data filed at 01/20/13 0500  Gross per 24 hour  Intake    240 ml  Output      0 ml  Net    240 ml    Lab Results:  Recent Labs  01/19/13 1514 01/19/13 1940  NA 137 138  K 3.8 3.8  CL 101 102  CO2 28 29  GLUCOSE 78 83  BUN 24* 24*  CREATININE 1.38* 1.35  CALCIUM 8.8 8.5  MG  --  1.7  PHOS  --  3.9    Recent Labs  01/19/13 1940  AST 16  ALT 6  ALKPHOS 70  BILITOT 0.7  PROT 6.1  ALBUMIN 3.0*   No results found for this basename: LIPASE, AMYLASE,  in the last 72 hours  Recent Labs  01/19/13 1514 01/19/13 1940  WBC 11.8* 11.1*  NEUTROABS 9.1* 7.9*  HGB 13.4 13.4  HCT 40.6 40.6  MCV 92.3 92.9  PLT 133* 142*   No results found for this basename: CKTOTAL, CKMB, CKMBINDEX, TROPONINI,  in the last 72 hours No components found with this basename: POCBNP,  No results found for this basename: DDIMER,  in the last 72 hours No results found for this basename: HGBA1C,  in the last 72 hours No results found for  this basename: CHOL, HDL, LDLCALC, TRIG, CHOLHDL, LDLDIRECT,  in the last 72 hours No results found for this basename: TSH, T4TOTAL, FREET3, T3FREE, THYROIDAB,  in the last 72 hours No results found for this basename: VITAMINB12, FOLATE, FERRITIN, TIBC, IRON, RETICCTPCT,  in the last 72 hours  Studies/Results: Dg Chest Port 1 View  01/19/2013   *RADIOLOGY REPORT*  Clinical Data: Emphysema.  PORTABLE CHEST - 1 VIEW  Comparison: 01/16/2013 and 04/22/2012 and a chest CT dated 11/21/2006  Findings: Heart size is normal.  Main pulmonary arteries are slightly prominent with peripheral pruning of the vessels suggesting pulmonary arterial hypertension.  Chronic obstructive and interstitial changes are stable.  No acute infiltrates or effusions.  No acute osseous abnormality.  Soft tissue density medially at the left lung apex is felt to represent a dilated nerve root sleeve and is unchanged since 11/21/2006.  IMPRESSION: No acute abnormality.  Emphysema.   Original Report Authenticated By: Francene Boyers, M.D.   Medications: Medications administered in the last 24 hours reviewed.  Current  Medication List reviewed.    LOS: 1 day   [facility] Internal Medicine @ [facility] [phone]) 01/20/2013, 7:55 AM

## 2013-01-20 NOTE — Consult Note (Signed)
Physical Medicine and Rehabilitation Consult  Reason for Consult: Pelvic fracture Referring Physician:  [name]   HPI: Micheal Frank is a 79 y.o. male with history of SDH, COPD, fall with left inferior and superior rami and sacral ala fracture o 01/16/13. He was admitted on 01/19/13 with inability to walk with severe pain. He was evaluated by [name] who recommended TDWB LLE. OT evaluation done today. MD recommending CIR.  The patient states that he had severe pain yesterday but his medications were changed and now he can participate with his therapy better. Answers questions appropriately  Patient states that after his infected total knee replacement he went to 1 skilled nursing facility for 6 weeks and another  facility for  6 more weeks  Review of Systems  HENT: Positive for hearing loss.   Eyes: Negative for blurred vision and double vision.  Respiratory: Negative for shortness of breath.   Cardiovascular: Negative for chest pain and palpitations.  Gastrointestinal: Negative for heartburn and nausea.  Musculoskeletal: Positive for joint pain. Negative for myalgias.  Neurological: Negative for headaches.   Past Medical History  Diagnosis Date  . Subdural hematoma 03/30/11  . Epidural abscess   . Staphylococcus aureus bacteremia with sepsis   . Prosthetic joint infection   . Hyperlipidemia   . History of blood clots   . Blood dyscrasia     pt CAN NOT have any blood thinners d/t hx brain bleed  . S/P right heart catheterization     at least 53yrs  . Asthma   . COPD (chronic obstructive pulmonary disease)     emphysema  . Shortness of breath     with exertion  . Pneumonia     as a child and in 1952;had a pneumonia vaccine couple of years ago  . Peripheral neuropathy   . Short-term memory loss   . Arthritis     left leg also has swelling  . Gastric ulcer   . Constipation     miralax prn  . Depression     takes wellbutrin bid  . Urinary frequency     wears depends   . Complication of anesthesia     confusion with anesthesia  . Sleep apnea     has CPAP but doesn't use it;sleep study done at least 81yrs ago   Past Surgical History  Procedure Laterality Date  . Hernia repair      25+yrs ago  . External ear surgery      shunt placed in right ear d/t mennieres   . Total knee arthroplasty      x 2 left knee 2003/2012  . Filtering procedure      filter placed in neck d/t blood  clot  . Eye surgery      bil cataract surgery  . Skin biopsy      melanoma on head   . Knee arthroscopy    . Total knee revision  05/23/2011    Procedure: TOTAL KNEE REVISION;  Surgeon: [name];  Location: [facility] OR;  Service: Orthopedics;  Laterality: Left;  LEFT KNEE REMOVAL OF SPACER, POSSIBLE REIMPLANTATION OF REVISION TKA VERSES REPLACEMENT ANTIBIOTIC SPACER  . Epidural abscess drainage  12/2010   History reviewed. No pertinent family history.  Social History:  Lives with daughter and wife (disabled).  Per reports that he quit smoking about 34 years ago. His smoking use included Cigarettes. He has a 80 pack-year smoking history. He has never used smokeless tobacco. He reports  that he does not drink alcohol or use illicit drugs.   Allergies  Allergen Reactions  . Anticoagulant Compound     History of bleeding on the brain per daughter   Medications Prior to Admission  Medication Sig Dispense Refill  . albuterol (PROAIR HFA) 108 (90 BASE) MCG/ACT inhaler Inhale 2 puffs into the lungs every 6 (six) hours as needed for wheezing.  1 Inhaler  4  . buPROPion (WELLBUTRIN SR) 100 MG 12 hr tablet Take 200 mg by mouth 2 (two) times daily.       . calcium carbonate (OS-CAL) 600 MG TABS Take 600 mg by mouth daily.       . Chlorpheniramine Tannate 12 MG TABS Take 12 mg by mouth at bedtime.      . diazepam (VALIUM) 5 MG tablet Take 5 mg by mouth every 8 (eight) hours as needed for anxiety.      Marland Kitchen doxazosin (CARDURA) 4 MG tablet Take 4 mg by mouth at bedtime.      .  fluticasone (FLONASE) 50 MCG/ACT nasal spray Place 2 sprays into the nose 2 (two) times daily.  16 g  6  . Guaifenesin (MUCINEX MAXIMUM STRENGTH) 1200 MG TB12 Take 1,200 mg by mouth daily.      . meclizine (ANTIVERT) 25 MG tablet Take 25 mg by mouth daily as needed (vertigo).       . Multiple Vitamin (MULTIVITAMIN WITH MINERALS) TABS Take 1 tablet by mouth daily.      Marland Kitchen Respiratory Therapy Supplies (FLUTTER) DEVI Use 3 times daily  1 each  0  . rOPINIRole (REQUIP) 1 MG tablet Take 1 mg by mouth at bedtime.       . simvastatin (ZOCOR) 40 MG tablet Take 40 mg by mouth at bedtime.       . triamterene-hydrochlorothiazide (MAXZIDE) 75-50 MG per tablet Take 0.5 tablets by mouth daily.      . [DISCONTINUED] cephALEXin (KEFLEX) 500 MG capsule Take 1 capsule (500 mg total) by mouth 4 (four) times daily.  28 capsule  0  . [DISCONTINUED] Dextromethorphan-Guaifenesin (TUSSIN DM) 10-100 MG/5ML liquid Take 10 mLs by mouth daily as needed (cough).       . [DISCONTINUED] HYDROcodone-acetaminophen (NORCO/VICODIN) 5-325 MG per tablet Take 1 tablet by mouth at bedtime as needed. For pain      . [DISCONTINUED] HYDROcodone-homatropine (HYCODAN) 5-1.5 MG/5ML syrup Take 5 mLs by mouth every 6 (six) hours as needed for cough.      . [DISCONTINUED] oxycodone (OXY-IR) 5 MG capsule Take 5-10 mg by mouth every 4 (four) hours as needed for pain.        Home: Home Living Family/patient expects to be discharged to:: Private residence Living Arrangements: Spouse/significant other;Children  Functional History:   Functional Status:  Mobility: Bed Mobility Bed Mobility: Supine to Sit;Sit to Supine Supine to Sit: 1: +2 Total assist Sit to Supine: 1: +2 Total assist        ADL: ADL Eating/Feeding: Independent Where Assessed - Eating/Feeding: Bed level Grooming: Set up Where Assessed - Grooming: Supine, head of bed up Upper Body Bathing: Set up Where Assessed - Upper Body Bathing: Supine, head of bed up Lower Body  Bathing: +2 Total assistance Where Assessed - Lower Body Bathing: Supported sit to stand Upper Body Dressing: Set up Where Assessed - Upper Body Dressing: Supine, head of bed up Lower Body Dressing: +2 Total assistance Where Assessed - Lower Body Dressing: Supported sit to stand Toilet Transfer: Simulated;+2 Total assistance  Toilet Transfer Method: Sit to Barista: Other (comment) (from bed) Tub/Shower Transfer Method: Not assessed Equipment Used: Gait belt ADL Comments: Pt had urinated in bed upon therapy's arrival. Total A for hygiene. Pt overal Total A for LB ADLs.   Cognition:   Cognition Arousal/Alertness: Awake/alert Behavior During Therapy: WFL for tasks assessed/performed  Blood pressure 120/59, pulse 71, temperature 98.1 F (36.7 C), temperature source Oral, resp. rate 18, height 5\' 9"  (1.753 m), weight 73.029 kg (161 lb), SpO2 97.00%.  Physical Exam  Nursing note and vitals reviewed. Constitutional: He is oriented to person, place, and time. He appears well-developed and well-nourished.  Anxious and uncomfortable due to pain.   HENT:  Head: Normocephalic and atraumatic.  Well healed old scar on forehead.   Eyes: Pupils are equal, round, and reactive to light.  Cardiovascular: Normal rate and regular rhythm.   Pulmonary/Chest: Effort normal and breath sounds normal. No respiratory distress. He has no wheezes.  Abdominal: Soft. Bowel sounds are normal. He exhibits no distension.  Musculoskeletal: He exhibits tenderness.  LLE limited by pain.  Neurological: He is alert and oriented to person, place, and time.   5/5 strength in bilateral deltoid, bicep, tricep, grip 5/5 in right hip flexor knee extensor ankle dorsiflexor plantar flexor 3 minus/5 strength in the left hip flexor knee extensor 4 minus in the left ankle dorsiflexor and plantar flexor Sensory exam intact to light touch in both feet  Results for orders placed during the hospital  encounter of 01/19/13 (from the past 24 hour(s))  COMPREHENSIVE METABOLIC PANEL     Status: Abnormal   Collection Time    01/19/13  7:40 PM      Result Value Range   Sodium 138  135 - 145 mEq/L   Potassium 3.8  3.5 - 5.1 mEq/L   Chloride 102  96 - 112 mEq/L   CO2 29  19 - 32 mEq/L   Glucose, Bld 83  70 - 99 mg/dL   BUN 24 (*) 6 - 23 mg/dL   Creatinine, Ser 4.78  0.50 - 1.35 mg/dL   Calcium 8.5  8.4 - 29.5 mg/dL   Total Protein 6.1  6.0 - 8.3 g/dL   Albumin 3.0 (*) 3.5 - 5.2 g/dL   AST 16  0 - 37 U/L   ALT 6  0 - 53 U/L   Alkaline Phosphatase 70  39 - 117 U/L   Total Bilirubin 0.7  0.3 - 1.2 mg/dL   GFR calc non Af Amer 46 (*) >90 mL/min   GFR calc Af Amer 54 (*) >90 mL/min  MAGNESIUM     Status: None   Collection Time    01/19/13  7:40 PM      Result Value Range   Magnesium 1.7  1.5 - 2.5 mg/dL  PHOSPHORUS     Status: None   Collection Time    01/19/13  7:40 PM      Result Value Range   Phosphorus 3.9  2.3 - 4.6 mg/dL  CBC WITH DIFFERENTIAL     Status: Abnormal   Collection Time    01/19/13  7:40 PM      Result Value Range   WBC 11.1 (*) 4.0 - 10.5 K/uL   RBC 4.37  4.22 - 5.81 MIL/uL   Hemoglobin 13.4  13.0 - 17.0 g/dL   HCT 62.1  30.8 - 65.7 %   MCV 92.9  78.0 - 100.0 fL   MCH 30.7  26.0 -  34.0 pg   MCHC 33.0  30.0 - 36.0 g/dL   RDW 16.1  09.6 - 04.5 %   Platelets 142 (*) 150 - 400 K/uL   Neutrophils Relative % 72  43 - 77 %   Neutro Abs 7.9 (*) 1.7 - 7.7 K/uL   Lymphocytes Relative 11 (*) 12 - 46 %   Lymphs Abs 1.2  0.7 - 4.0 K/uL   Monocytes Relative 15 (*) 3 - 12 %   Monocytes Absolute 1.7 (*) 0.1 - 1.0 K/uL   Eosinophils Relative 2  0 - 5 %   Eosinophils Absolute 0.2  0.0 - 0.7 K/uL   Basophils Relative 0  0 - 1 %   Basophils Absolute 0.0  0.0 - 0.1 K/uL   Dg Chest Port 1 View  01/19/2013   *RADIOLOGY REPORT*  Clinical Data: Emphysema.  PORTABLE CHEST - 1 VIEW  Comparison: 01/16/2013 and 04/22/2012 and a chest CT dated 11/21/2006  Findings: Heart size is  normal.  Main pulmonary arteries are slightly prominent with peripheral pruning of the vessels suggesting pulmonary arterial hypertension.  Chronic obstructive and interstitial changes are stable.  No acute infiltrates or effusions.  No acute osseous abnormality.  Soft tissue density medially at the left lung apex is felt to represent a dilated nerve root sleeve and is unchanged since 11/21/2006.  IMPRESSION: No acute abnormality.  Emphysema.   Original Report Authenticated By: Francene Boyers, M.D.    Assessment/Plan: Diagnosis: Left superior and inferior to make ramus fracture as well as left sacral ala fracture after fall 1. Does the need for close, 24 hr/day medical supervision in concert with the patient's rehab needs make it unreasonable for this patient to be served in a less intensive setting? Yes 2. Co-Morbidities requiring supervision/potential complications: Hearing loss, sleep apnea, osteoporosis, left knee contracture, COPD, chronic kidney disease 3. Due to bladder management, bowel management, safety, skin/wound care, disease management, medication administration, pain management and patient education, does the patient require 24 hr/day rehab nursing? Yes 4. Does the patient require coordinated care of a physician, rehab nurse, PT (1-2 hrs/day, 5 days/week) and OT (1-2 hrs/day, 5 days/week) to address physical and functional deficits in the context of the above medical diagnosis(es)? Yes Addressing deficits in the following areas: balance, endurance, locomotion, strength, transferring, bathing, dressing, feeding, grooming and toileting 5. Can the patient actively participate in an intensive therapy program of at least 3 hrs of therapy per day at least 5 days per week? Yes 6. The potential for patient to make measurable gains while on inpatient rehab is good 7. Anticipated functional outcomes upon discharge from inpatient rehab are supervision  with PT, supervision with OT, not applicable with  SLP. 8. Estimated rehab length of stay to reach the above functional goals is: 2 weeks 9. Does the patient have adequate social supports to accommodate these discharge functional goals? Potentially 10. Anticipated D/C setting: Home 11. Anticipated post D/C treatments: HH therapy 12. Overall Rehab/Functional Prognosis: good  RECOMMENDATIONS: This patient's condition is appropriate for continued rehabilitative care in the following setting: CIR if patient makes good progress with physical therapy on acute care. Otherwise weill need SNF for longer term rehabilitation Patient has agreed to participate in recommended program. Potentially Note that insurance prior authorization may be required for reimbursement for recommended care.  Comment: Rehabilitation admission nurse to monitor therapy progress    01/20/2013

## 2013-01-21 NOTE — Progress Notes (Signed)
Rehab Admissions Coordinator Note:  Patient was screened by Brock Ra for appropriateness for an Inpatient Acute Rehab Consult.  At this time, pt has an Inpatient Rehab consult.  Brock Ra 01/21/2013, 7:43 AM  I can be reached at [phone].

## 2013-01-21 NOTE — Progress Notes (Signed)
Physical Therapy Treatment Patient Details Name: Micheal Frank MRN: [id number] DOB: 08/02/1925 Today's Date: 01/21/2013 Time: 4098-1191 PT Time Calculation (min): 26 min  PT Assessment / Plan / Recommendation  PT Comments   Much better activity tolerance, transfers, and even took a few steps with pain better controlled; While Micheal Frank may not be a typical Rehab pt, he will still likely make excellent progress with a CIR stay  Follow Up Recommendations  CIR     Does the patient have the potential to tolerate intense rehabilitation     Barriers to Discharge        Equipment Recommendations  Rolling walker with 5" wheels;3in1 (PT);Wheelchair (measurements PT)    Recommendations for Other Services Rehab consult  Frequency Min 5X/week   Progress towards PT Goals Progress towards PT goals: Progressing toward goals  Plan Current plan remains appropriate    Precautions / Restrictions Precautions Precautions: Fall Restrictions Weight Bearing Restrictions: Yes LLE Weight Bearing: Touchdown weight bearing   Pertinent Vitals/Pain 6/10 L hip/back patient repositioned for comfort     Mobility  Bed Mobility Bed Mobility: Supine to Sit Supine to Sit: 2: Max assist;With rails Details for Bed Mobility Assistance: Cues for technique; Extreme pain with motion of R and LLEs; Extensive physical assist to elevate trunk from bed Transfers Transfers: Sit to Stand;Stand to Sit;Stand Pivot Transfers Sit to Stand: 1: +2 Total assist Sit to Stand: Patient Percentage: 50% Stand to Sit: 1: +2 Total assist Stand to Sit: Patient Percentage: 60% Stand Pivot Transfers: 3: Mod assist Details for Transfer Assistance: Cues for technique, hand placement, safety, and TDWB LLE; Demonstrated basic pivot transfer prior to pt performing; Overall good pivot transfer towards Right side, with touchdown of LLE, but minimal weight on it; Able to perform sit <>stand x2 to RW with cues and physical assist for  power up Ambulation/Gait Ambulation/Gait Assistance: 1: +2 Total assist Ambulation/Gait: Patient Percentage: 60% Ambulation Distance (Feet): 3 Feet Assistive device: Rolling walker Ambulation/Gait Assistance Details: Verbal and demo cues for gait sequence and TWWB LLE; Pt able to take a few steps on R, bearing most of body weight on RW with any standing activities Gait Pattern: Step-to pattern    Exercises     PT Diagnosis:    PT Problem List:   PT Treatment Interventions:     PT Goals (current goals can now be found in the care plan section) Acute Rehab PT Goals Patient Stated Goal: less pain PT Goal Formulation: With patient Time For Goal Achievement: 02/03/13 Potential to Achieve Goals: Good  Visit Information  Last PT Received On: 01/21/13 Assistance Needed: +2 History of Present Illness: 79 yo pt s/p fall 01/16/2013 resulting in L pubic rami fxs and sacral ala fx; TDWBing; Sent home from ER; Unable to manage at home    Subjective Data  Subjective: Reports feel sa little better Patient Stated Goal: less pain   Cognition  Cognition Arousal/Alertness: Awake/alert Behavior During Therapy: WFL for tasks assessed/performed Overall Cognitive Status: Within Functional Limits for tasks assessed    Balance  Static Sitting Balance Static Sitting - Balance Support: Right upper extremity supported;Left upper extremity supported Static Sitting - Level of Assistance: 5: Stand by assistance Static Sitting - Comment/# of Minutes: Much better sitting balance and tolerance, with less antalgic lean  End of Session PT - End of Session Equipment Utilized During Treatment: Gait belt Activity Tolerance: Patient tolerated treatment well Patient left: in chair;with call bell/phone within reach;with family/visitor present (lunch served) Nurse  Communication: Mobility status   GP     Micheal Frank Micheal Frank, Micheal Frank [phone]  01/21/2013, 12:44 PM

## 2013-01-21 NOTE — Clinical Social Work Placement (Addendum)
Clinical Social Work Department  CLINICAL SOCIAL WORK PLACEMENT NOTE  01/21/2013  Patient: HASSELL PATRAS Account Number: [id number] Admit date: 01/19/13  Clinical Social Worker: Sabino Niemann MSW Date/time: 01/21/2013 4:30 PM  Clinical Social Work is seeking post-discharge placement for this patient at the following level of care: SKILLED NURSING (*CSW will update this form in Epic as items are completed)  01/21/2013 Patient/family provided with [facility] Department of Clinical Social Work's list of facilities offering this level of care within the geographic area requested by the patient (or if unable, by the patient's family).  01/21/2013 Patient/family informed of their freedom to choose among providers that offer the needed level of care, that participate in Medicare, Medicaid or managed care program needed by the patient, have an available bed and are willing to accept the patient.  01/21/2013 Patient/family informed of [facility]' ownership interest in [facility], as well as of the fact that they are under no obligation to receive care at this facility.  PASARR submitted to EDS on Pre-exisitng PASARR number received from [employer] on  FL2 transmitted to all facilities in geographic area requested by pt/family on 01/21/2013  FL2 transmitted to all facilities within larger geographic area on  Patient informed that his/her managed care company has contracts with or will negotiate with certain facilities, including the following:  Patient/family informed of bed offers received:01/22/13  Patient chooses bed at [facility] Physician recommends and patient chooses bed at  Patient to be transferred to on 01/23/2013 Patient to be transferred to facility by [facility] The following physician request were entered in Epic:  Additional Comments:

## 2013-01-21 NOTE — Consult Note (Signed)
Reason for Consult: Leg pain Referring Physician: Dr. Namon Frank is an 79 y.o. male.  HPI: Micheal Frank is an 79 year old patient known to me from multiple prior hospitalizations. He fell on July 3. He was evaluated in the emergency room on Thursday, July 3 and found to have nondisplaced rami and sacral fractures. He was sent home. Patient was unable to transfer or walk or mobilize at home. He is currently admitted for intractable pain and mobilization and nursing home placement.  Past Medical History  Diagnosis Date  . Subdural hematoma 03/30/11  . Epidural abscess   . Staphylococcus aureus bacteremia with sepsis   . Prosthetic joint infection   . Hyperlipidemia   . History of blood clots   . Blood dyscrasia     pt CAN NOT have any blood thinners d/t hx brain bleed  . S/P right heart catheterization     at least 6yrs  . Asthma   . COPD (chronic obstructive pulmonary disease)     emphysema  . Shortness of breath     with exertion  . Pneumonia     as a child and in 1952;had a pneumonia vaccine couple of years ago  . Peripheral neuropathy   . Short-term memory loss   . Arthritis     left leg also has swelling  . Gastric ulcer   . Constipation     miralax prn  . Depression     takes wellbutrin bid  . Urinary frequency     wears depends  . Complication of anesthesia     confusion with anesthesia  . Sleep apnea     has CPAP but doesn't use it;sleep study done at least 22yrs ago    Past Surgical History  Procedure Laterality Date  . Hernia repair      25+yrs ago  . External ear surgery      shunt placed in right ear d/t mennieres   . Total knee arthroplasty      x 2 left knee 2003/2012  . Filtering procedure      filter placed in neck d/t blood  clot  . Eye surgery      bil cataract surgery  . Skin biopsy      melanoma on head   . Knee arthroscopy    . Total knee revision  05/23/2011    Procedure: TOTAL KNEE REVISION;  Surgeon: [name];   Location: [facility] OR;  Service: Orthopedics;  Laterality: Left;  LEFT KNEE REMOVAL OF SPACER, POSSIBLE REIMPLANTATION OF REVISION TKA VERSES REPLACEMENT ANTIBIOTIC SPACER  . Epidural abscess drainage  12/2010    History reviewed. No pertinent family history.  Social History:  reports that he quit smoking about 34 years ago. His smoking use included Cigarettes. He has a 80 pack-year smoking history. He has never used smokeless tobacco. He reports that he does not drink alcohol or use illicit drugs.  Allergies:  Allergies  Allergen Reactions  . Anticoagulant Compound     History of bleeding on the brain per daughter    Medications: I have reviewed the patient's current medications.  Results for orders placed during the hospital encounter of 01/19/13 (from the past 48 hour(s))  CBC WITH DIFFERENTIAL     Status: Abnormal   Collection Time    01/19/13  3:14 PM      Result Value Range   WBC 11.8 (*) 4.0 - 10.5 K/uL   RBC 4.40  4.22 - 5.81 MIL/uL  Hemoglobin 13.4  13.0 - 17.0 g/dL   HCT 40.9  81.1 - 91.4 %   MCV 92.3  78.0 - 100.0 fL   MCH 30.5  26.0 - 34.0 pg   MCHC 33.0  30.0 - 36.0 g/dL   RDW 78.2  95.6 - 21.3 %   Platelets 133 (*) 150 - 400 K/uL   Neutrophils Relative % 77  43 - 77 %   Neutro Abs 9.1 (*) 1.7 - 7.7 K/uL   Lymphocytes Relative 9 (*) 12 - 46 %   Lymphs Abs 1.1  0.7 - 4.0 K/uL   Monocytes Relative 13 (*) 3 - 12 %   Monocytes Absolute 1.5 (*) 0.1 - 1.0 K/uL   Eosinophils Relative 1  0 - 5 %   Eosinophils Absolute 0.1  0.0 - 0.7 K/uL   Basophils Relative 0  0 - 1 %   Basophils Absolute 0.0  0.0 - 0.1 K/uL  BASIC METABOLIC PANEL     Status: Abnormal   Collection Time    01/19/13  3:14 PM      Result Value Range   Sodium 137  135 - 145 mEq/L   Potassium 3.8  3.5 - 5.1 mEq/L   Chloride 101  96 - 112 mEq/L   CO2 28  19 - 32 mEq/L   Glucose, Bld 78  70 - 99 mg/dL   BUN 24 (*) 6 - 23 mg/dL   Creatinine, Ser 0.86 (*) 0.50 - 1.35 mg/dL   Calcium 8.8  8.4 - 57.8 mg/dL    GFR calc non Af Amer 45 (*) >90 mL/min   GFR calc Af Amer 52 (*) >90 mL/min   Comment:            The eGFR has been calculated     using the CKD EPI equation.     This calculation has not been     validated in all clinical     situations.     eGFR's persistently     <90 mL/min signify     possible Chronic Kidney Disease.  COMPREHENSIVE METABOLIC PANEL     Status: Abnormal   Collection Time    01/19/13  7:40 PM      Result Value Range   Sodium 138  135 - 145 mEq/L   Potassium 3.8  3.5 - 5.1 mEq/L   Chloride 102  96 - 112 mEq/L   CO2 29  19 - 32 mEq/L   Glucose, Bld 83  70 - 99 mg/dL   BUN 24 (*) 6 - 23 mg/dL   Creatinine, Ser 4.69  0.50 - 1.35 mg/dL   Calcium 8.5  8.4 - 62.9 mg/dL   Total Protein 6.1  6.0 - 8.3 g/dL   Albumin 3.0 (*) 3.5 - 5.2 g/dL   AST 16  0 - 37 U/L   ALT 6  0 - 53 U/L   Alkaline Phosphatase 70  39 - 117 U/L   Total Bilirubin 0.7  0.3 - 1.2 mg/dL   GFR calc non Af Amer 46 (*) >90 mL/min   GFR calc Af Amer 54 (*) >90 mL/min   Comment:            The eGFR has been calculated     using the CKD EPI equation.     This calculation has not been     validated in all clinical     situations.     eGFR's persistently     <90  mL/min signify     possible Chronic Kidney Disease.  MAGNESIUM     Status: None   Collection Time    01/19/13  7:40 PM      Result Value Range   Magnesium 1.7  1.5 - 2.5 mg/dL  PHOSPHORUS     Status: None   Collection Time    01/19/13  7:40 PM      Result Value Range   Phosphorus 3.9  2.3 - 4.6 mg/dL  CBC WITH DIFFERENTIAL     Status: Abnormal   Collection Time    01/19/13  7:40 PM      Result Value Range   WBC 11.1 (*) 4.0 - 10.5 K/uL   RBC 4.37  4.22 - 5.81 MIL/uL   Hemoglobin 13.4  13.0 - 17.0 g/dL   HCT 16.1  09.6 - 04.5 %   MCV 92.9  78.0 - 100.0 fL   MCH 30.7  26.0 - 34.0 pg   MCHC 33.0  30.0 - 36.0 g/dL   RDW 40.9  81.1 - 91.4 %   Platelets 142 (*) 150 - 400 K/uL   Neutrophils Relative % 72  43 - 77 %   Neutro Abs 7.9  (*) 1.7 - 7.7 K/uL   Lymphocytes Relative 11 (*) 12 - 46 %   Lymphs Abs 1.2  0.7 - 4.0 K/uL   Monocytes Relative 15 (*) 3 - 12 %   Monocytes Absolute 1.7 (*) 0.1 - 1.0 K/uL   Eosinophils Relative 2  0 - 5 %   Eosinophils Absolute 0.2  0.0 - 0.7 K/uL   Basophils Relative 0  0 - 1 %   Basophils Absolute 0.0  0.0 - 0.1 K/uL    Dg Chest Port 1 View  01/19/2013   *RADIOLOGY REPORT*  Clinical Data: Emphysema.  PORTABLE CHEST - 1 VIEW  Comparison: 01/16/2013 and 04/22/2012 and a chest CT dated 11/21/2006  Findings: Heart size is normal.  Main pulmonary arteries are slightly prominent with peripheral pruning of the vessels suggesting pulmonary arterial hypertension.  Chronic obstructive and interstitial changes are stable.  No acute infiltrates or effusions.  No acute osseous abnormality.  Soft tissue density medially at the left lung apex is felt to represent a dilated nerve root sleeve and is unchanged since 11/21/2006.  IMPRESSION: No acute abnormality.  Emphysema.   Original Report Authenticated By: Francene Boyers, M.D.    Review of Systems  Constitutional: Negative.   HENT: Negative.  Hearing loss: he denies any other orthopedic complaints.   Eyes: Negative.   Respiratory: Negative.   Cardiovascular: Negative.   Gastrointestinal: Negative.   Genitourinary: Negative.   Musculoskeletal: Positive for joint pain.  Skin: Negative.   Neurological: Negative.   Endo/Heme/Allergies: Negative.   Psychiatric/Behavioral: Negative.    Blood pressure 157/72, pulse 73, temperature 98.9 F (37.2 C), temperature source Oral, resp. rate 18, height 5\' 9"  (1.753 m), weight 73.029 kg (161 lb), SpO2 96.00%. Physical Exam  Constitutional: He appears well-developed.  HENT:  Head: Normocephalic.  Eyes: Pupils are equal, round, and reactive to light.  Neck: Normal range of motion.  Cardiovascular: Normal rate.   Respiratory: Effort normal.  GI: Soft.  Neurological: He is alert.  Skin: Skin is warm.    patient's pain with range of motion on the left side knee is stable on the left and right pedal pulses are palpable. Does report both groin pain and trochanteric pain on the left no symptoms on the right  Assessment/Plan: Impression  is nondisplaced superior and inferior rami fractures on the left along with nondisplaced sacral fracture. I discussed the findings with his daughter on Sunday and showed her the MRI scan. Plan is for touchdown weightbearing to progress as tolerated. Thank you will likely need to 3 weeks in a skilled nursing facility to progress. The fracture to nonoperative and should heal.  [name],[name] 01/21/2013, 11:29 AM

## 2013-01-21 NOTE — Progress Notes (Signed)
Assessment/Plan: Principal Problem:   Pelvic fracture - I see that OT and PT think he is CIR candidate. That is great. Hopefully, insurance and CIR docs will agree. If not, then will need SNF rehab. He could be transferred to CIR anytime.   Active Problems:   RESTLESS LEG SYNDROME   NEUROPATHY   COPD gold stage C.   SLEEP APNEA   HISTORY OF ASBESTOS EXPOSURE   Chronic kidney disease, stage III (moderate)   Subjective: No new issues. Hydrocodone is adequate pain relief.   Objective:  Vital Signs: Filed Vitals:   01/19/13 2222 01/20/13 0406 01/20/13 1310 01/20/13 2200  BP: 101/49 116/54 120/59 157/72  Pulse: 77 69 71 73  Temp: 99.1 F (37.3 C) 98.7 F (37.1 C) 98.1 F (36.7 C) 98.9 F (37.2 C)  TempSrc: Oral Oral    Resp: 18 18 18 18   Height:      Weight:      SpO2: 100% 98% 97% 96%     EXAM: comfortable.    Intake/Output Summary (Last 24 hours) at 01/21/13 0736 Last data filed at 01/20/13 1230  Gross per 24 hour  Intake    600 ml  Output      0 ml  Net    600 ml    Lab Results:  Recent Labs  01/19/13 1514 01/19/13 1940  NA 137 138  K 3.8 3.8  CL 101 102  CO2 28 29  GLUCOSE 78 83  BUN 24* 24*  CREATININE 1.38* 1.35  CALCIUM 8.8 8.5  MG  --  1.7  PHOS  --  3.9    Recent Labs  01/19/13 1940  AST 16  ALT 6  ALKPHOS 70  BILITOT 0.7  PROT 6.1  ALBUMIN 3.0*   No results found for this basename: LIPASE, AMYLASE,  in the last 72 hours  Recent Labs  01/19/13 1514 01/19/13 1940  WBC 11.8* 11.1*  NEUTROABS 9.1* 7.9*  HGB 13.4 13.4  HCT 40.6 40.6  MCV 92.3 92.9  PLT 133* 142*   No results found for this basename: CKTOTAL, CKMB, CKMBINDEX, TROPONINI,  in the last 72 hours No components found with this basename: POCBNP,  No results found for this basename: DDIMER,  in the last 72 hours No results found for this basename: HGBA1C,  in the last 72 hours No results found for this basename: CHOL, HDL, LDLCALC, TRIG, CHOLHDL, LDLDIRECT,  in the last  72 hours No results found for this basename: TSH, T4TOTAL, FREET3, T3FREE, THYROIDAB,  in the last 72 hours No results found for this basename: VITAMINB12, FOLATE, FERRITIN, TIBC, IRON, RETICCTPCT,  in the last 72 hours  Studies/Results: Dg Chest Port 1 View  01/19/2013   *RADIOLOGY REPORT*  Clinical Data: Emphysema.  PORTABLE CHEST - 1 VIEW  Comparison: 01/16/2013 and 04/22/2012 and a chest CT dated 11/21/2006  Findings: Heart size is normal.  Main pulmonary arteries are slightly prominent with peripheral pruning of the vessels suggesting pulmonary arterial hypertension.  Chronic obstructive and interstitial changes are stable.  No acute infiltrates or effusions.  No acute osseous abnormality.  Soft tissue density medially at the left lung apex is felt to represent a dilated nerve root sleeve and is unchanged since 11/21/2006.  IMPRESSION: No acute abnormality.  Emphysema.   Original Report Authenticated By: Francene Boyers, M.D.   Medications: Medications administered in the last 24 hours reviewed.  Current Medication List reviewed.    LOS: 2 days   [facility] Internal  Medicine @ [facility] [phone]) 01/21/2013, 7:36 AM

## 2013-01-21 NOTE — Clinical Social Work Psychosocial (Signed)
Clinical Social Work Department  BRIEF PSYCHOSOCIAL ASSESSMENT  Patient: MALACAI GRANTZ Account Number: [id number]  Admit date: 01/19/13 Clinical Social Worker Sabino Niemann, MSW Date/Time: 01/21/2013 4:00 PM Referred by: Physician Date Referred: 01/21/2013 Referred for   SNF Placement   Other Referral:  Interview type: Patient and patient's family Other interview type: PSYCHOSOCIAL DATA  Living Status: wife Admitted from facility:  Level of care:  Primary support name: Jelan Batterton Primary support relationship to patient: Wife Degree of support available:  Strong and vested  CURRENT CONCERNS  Current Concerns   Post-Acute Placement   Other Concerns:  SOCIAL WORK ASSESSMENT / PLAN  CSW met with pt and patient's family re: PT recommendation for SNF.   Pt lives with his spouse  CSW explained placement process and answered questions.   [facility]- he has been there in the past  CSW completed FL2 and initiated SNF search.     Assessment/plan status: Information/Referral to [facility]  Other assessment/ plan:  Information/referral to community resources:  SNF   PTAR  PATIENT'S/FAMILY'S RESPONSE TO PLAN OF CARE:  Pt  reports he is agreeable to ST SNF in order to increase strength and independence with mobility prior to returning home  Pt verbalized understanding of placement process and appreciation for CSW assist.   Sabino Niemann, MSW [phone]

## 2013-01-22 NOTE — Progress Notes (Signed)
Physical Therapy Treatment Patient Details Name: Micheal Frank MRN: [id number] DOB: 1926-01-19 Today's Date: 77/03/2013 Time: 2952-8413 PT Time Calculation (min): 20 min  PT Assessment / Plan / Recommendation  PT Comments   Continuing progress with activity tolerance, especially with premedication for pain; CIR would be a great option for rehabilitative care -- Micheal Frank will likely be able to reach a modified independent functional level (even if it is predominantly at wheelchair level) with a CIR stay  Follow Up Recommendations  CIR     Does the patient have the potential to tolerate intense rehabilitation     Barriers to Discharge        Equipment Recommendations  Rolling walker with 5" wheels;3in1 (PT);Wheelchair (measurements PT)    Recommendations for Other Services    Frequency Min 5X/week   Progress towards PT Goals Progress towards PT goals: Progressing toward goals  Plan Current plan remains appropriate    Precautions / Restrictions Precautions Precautions: Fall Restrictions Weight Bearing Restrictions: Yes LLE Weight Bearing: Touchdown weight bearing   Pertinent Vitals/Pain 10/10 pain in L pelvis patient repositioned for comfort     Mobility  Bed Mobility Bed Mobility: Supine to Sit Supine to Sit: 3: Mod assist;HOB elevated;With rails Details for Bed Mobility Assistance: Much improved ability to transition to sitting today, pushing through R elbow and heavily using R bed rail; Micheal Frank assist to move LEs off of bed Transfers Transfers: Sit to Stand;Stand to Sit;Stand Pivot Transfers Sit to Stand: 1: +2 Total assist Sit to Stand: Patient Percentage: 60% Stand to Sit: 1: +2 Total assist Stand to Sit: Patient Percentage: 60% Stand Pivot Transfers: 4: Min assist Details for Transfer Assistance: Cues for technique, hand placement, safety, and TDWB LLE; Overall good pivot transfer towards Right side, with touchdown of LLE, but minimal weight on it; Able to  perform sit <>stand x2 to RW with cues and physical assist for power up; Unsteady with transition of hands from recliner to RW Ambulation/Gait Ambulation/Gait Assistance: 1: +2 Total assist Ambulation/Gait: Patient Percentage: 70% Ambulation Distance (Feet): 5 Feet Assistive device: Rolling walker Ambulation/Gait Assistance Details: continued cues for gait sequence, and TTWBing LLE; Able to take more steps today , and essentially kept NWBing Gait Pattern: Step-to pattern    Exercises     PT Diagnosis:    PT Problem List:   PT Treatment Interventions:     PT Goals (current goals can now be found in the care plan section) Acute Rehab PT Goals Patient Stated Goal: less pain PT Goal Formulation: With patient Time For Goal Achievement: 02/03/13 Potential to Achieve Goals: Good  Visit Information  Assistance Needed: +2 (potentially +1 soon) History of Present Illness: 79 yo pt s/p fall 01/16/2013 resulting in L pubic rami fxs and sacral ala fx; TDWBing; Sent home from ER; Unable to manage at home    Subjective Data  Subjective: Would like to know where he is going  for rehab Patient Stated Goal: less pain   Cognition  Cognition Arousal/Alertness: Awake/alert Behavior During Therapy: WFL for tasks assessed/performed Overall Cognitive Status: Within Functional Limits for tasks assessed    Balance     End of Session PT - End of Session Equipment Utilized During Treatment: Gait belt Activity Tolerance: Patient tolerated treatment well Patient left: in chair;with call bell/phone within reach;with family/visitor present   GP     Van Clines [facility] [place], [place] [phone]  01/22/2013, 12:49 PM

## 2013-01-22 NOTE — Progress Notes (Signed)
Less pain with left leg active and passive rom Will likely need approx 10 days in snf before ambulating with walker Needs ortho f/u 2 weeks for recheck xrays

## 2013-01-22 NOTE — Progress Notes (Addendum)
Patient has a bed at [facility] . Patient and patient's daughter are aware.  Sabino Niemann, MSW, [phone]

## 2013-01-22 NOTE — Progress Notes (Signed)
Assessment/Plan: Principal Problem:   Pelvic fracture - no new issues. His pain is adequately controlled at rest with hydrocodone. He is making some progress with PT. The issue is CIR vs SNF/rehab. I have signed FL2.  Active Problems:   RESTLESS LEG SYNDROME   NEUROPATHY   COPD gold stage C.   SLEEP APNEA   HISTORY OF ASBESTOS EXPOSURE   Chronic kidney disease, stage III (moderate)   Subjective: Feels fine. Was up bearing weight on right leg and making transfers yesterday.   Objective:  Vital Signs: Filed Vitals:   01/20/13 2200 01/21/13 1300 01/21/13 2044 01/21/13 2046  BP: 157/72 118/55 114/56   Pulse: 73 67 70   Temp: 98.9 F (37.2 C) 98.1 F (36.7 C) 98.6 F (37 C)   TempSrc:      Resp: 18 18 16    Height:      Weight:      SpO2: 96% 94% 91% 94%     EXAM: comfortable.    Intake/Output Summary (Last 24 hours) at 01/22/13 4696 Last data filed at 01/21/13 1922  Gross per 24 hour  Intake    630 ml  Output   1195 ml  Net   -565 ml    Lab Results:  Recent Labs  01/19/13 1514 01/19/13 1940  NA 137 138  K 3.8 3.8  CL 101 102  CO2 28 29  GLUCOSE 78 83  BUN 24* 24*  CREATININE 1.38* 1.35  CALCIUM 8.8 8.5  MG  --  1.7  PHOS  --  3.9    Recent Labs  01/19/13 1940  AST 16  ALT 6  ALKPHOS 70  BILITOT 0.7  PROT 6.1  ALBUMIN 3.0*   No results found for this basename: LIPASE, AMYLASE,  in the last 72 hours  Recent Labs  01/19/13 1514 01/19/13 1940  WBC 11.8* 11.1*  NEUTROABS 9.1* 7.9*  HGB 13.4 13.4  HCT 40.6 40.6  MCV 92.3 92.9  PLT 133* 142*   No results found for this basename: CKTOTAL, CKMB, CKMBINDEX, TROPONINI,  in the last 72 hours No components found with this basename: POCBNP,  No results found for this basename: DDIMER,  in the last 72 hours No results found for this basename: HGBA1C,  in the last 72 hours No results found for this basename: CHOL, HDL, LDLCALC, TRIG, CHOLHDL, LDLDIRECT,  in the last 72 hours No results found for  this basename: TSH, T4TOTAL, FREET3, T3FREE, THYROIDAB,  in the last 72 hours No results found for this basename: VITAMINB12, FOLATE, FERRITIN, TIBC, IRON, RETICCTPCT,  in the last 72 hours  Studies/Results: No results found. Medications: Medications administered in the last 24 hours reviewed.  Current Medication List reviewed.    LOS: 3 days   [facility] Internal Medicine @ [facility] [phone]) 01/22/2013, 6:08 AM

## 2013-01-22 NOTE — Progress Notes (Signed)
I met with patient at bedside and contacted his daughter by phone per his request. We recommend SNF rehab at this time for a slower recovery period. Patient states his pain still an issue and he feels he needs a slower pace also for he remembers the intensity of his previous inpt rehab stay in 2012. Daughter is aware and both are in agreement to SNF rehab at this time. SW and RN CM contacted. [phone]

## 2013-01-23 DIAGNOSIS — N3941 Urge incontinence: Secondary | ICD-10-CM | POA: Diagnosis not present

## 2013-01-23 DIAGNOSIS — R8281 Pyuria: Secondary | ICD-10-CM | POA: Diagnosis not present

## 2013-01-23 LAB — URINALYSIS, ROUTINE W REFLEX MICROSCOPIC
Bilirubin Urine: NEGATIVE
Glucose, UA: NEGATIVE mg/dL
Ketones, ur: NEGATIVE mg/dL
Nitrite: POSITIVE — AB
Protein, ur: 30 mg/dL — AB
Specific Gravity, Urine: 1.021 (ref 1.005–1.030)
Urobilinogen, UA: 1 mg/dL (ref 0.0–1.0)
pH: 7 (ref 5.0–8.0)

## 2013-01-23 LAB — URINE MICROSCOPIC-ADD ON

## 2013-01-23 MED ORDER — CIPROFLOXACIN HCL 500 MG PO TABS
500.0000 mg | ORAL_TABLET | Freq: Two times a day (BID) | ORAL | Status: DC
  Administered 2013-01-23: 500 mg via ORAL
  Filled 2013-01-23 (×3): qty 1

## 2013-01-23 MED ORDER — HYDROCODONE-ACETAMINOPHEN 5-325 MG PO TABS: 1.0000 | ORAL_TABLET | Freq: Four times a day (QID) | ORAL | Status: DC | PRN

## 2013-01-23 MED ORDER — CIPROFLOXACIN HCL 500 MG PO TABS: 500.0000 mg | ORAL_TABLET | Freq: Two times a day (BID) | ORAL | Status: DC

## 2013-01-23 NOTE — Progress Notes (Signed)
Pt stable Dc to [facility] F/u 2 weeks

## 2013-01-23 NOTE — Care Management Note (Signed)
    Page 1 of 1   01/23/2013     3:05:39 PM   CARE MANAGEMENT NOTE 01/23/2013  Patient:  Micheal Frank, Micheal Frank   Account Number:  [id number]  Date Initiated:  01/22/2013  Documentation initiated by:  [facility]  Subjective/Objective Assessment:   admitted with pelvis fracture     Action/Plan:   PT/OT evals-recommended inpatient rehab  inpatient rehab commended SNF   Anticipated DC Date:  01/23/2013   Anticipated DC Plan:  SKILLED NURSING FACILITY  In-house referral  Clinical Social Worker      DC Planning Services  CM consult      Choice offered to / List presented to:             Status of service:  Completed, signed off Medicare Important Message given?   (If response is "NO", the following Medicare IM given date fields will be blank) Date Medicare IM given:   Date Additional Medicare IM given:    Discharge Disposition:  SKILLED NURSING FACILITY  Per UR Regulation:    If discussed at Long Length of Stay Meetings, dates discussed:    Comments:

## 2013-01-23 NOTE — Discharge Summary (Addendum)
Physician Discharge Summary  NAME:Micheal Frank  VOZ:[id number]  DOB: 10/08/1925   Admit date: 01/19/2013 Discharge date: 01/23/2013  Admitting Diagnosis: Pelvic fracture  Discharge Diagnoses:  Active Hospital Problems   Diagnosis Date Noted  . Pelvic fracture 01/19/2013  . Urgency incontinence 01/23/2013  . Pyuria 01/23/2013  . Chronic kidney disease, stage III (moderate) 01/20/2013  . SLEEP APNEA 04/30/2008  . HISTORY OF ASBESTOS EXPOSURE 04/30/2008  . NEUROPATHY 04/30/2008  . RESTLESS LEG SYNDROME 04/30/2008  . COPD gold stage C. 04/30/2008    Resolved Hospital Problems   Diagnosis Date Noted Date Resolved  No resolved problems to display.    Discharge Condition: improved  Hospital Course: Patient was admitted due to inadequate pain relief at home after initial ER visit. He was seen by Dr. Alfonso Patten and TDWB as tolerated was recommended. He was seen by PT and OT and thought to be CIR candidate but could not participate in enough hours of therapy to qualify. SNF-rehab was arranged.   He had some urinary incontinence on the evening prior to d/c. A UA and urine culture done at admit were negative for specific infection. Another UA was done on the day of d/c showed pyuria and Cipro was started. Urine culture was done and will be followed up on by me.   Consults: ortho  Disposition: SNF-rehab  Things to follow up in the outpatient setting: UA and urine culture  Discharge Orders   Future Orders Complete By Expires     Diet - low sodium heart healthy  As directed     Increase activity slowly  As directed         Medication List    STOP taking these medications       cephALEXin 500 MG capsule  Commonly known as:  KEFLEX     Chlorpheniramine Tannate 12 MG Tabs     Flutter Devi     HYDROcodone-homatropine 5-1.5 MG/5ML syrup  Commonly known as:  HYCODAN     oxycodone 5 MG capsule  Commonly known as:  OXY-IR     TUSSIN DM 10-100 MG/5ML liquid  Generic drug:   Dextromethorphan-Guaifenesin      TAKE these medications       albuterol 108 (90 BASE) MCG/ACT inhaler  Commonly known as:  PROAIR HFA  Inhale 2 puffs into the lungs every 6 (six) hours as needed for wheezing.     buPROPion 100 MG 12 hr tablet  Commonly known as:  WELLBUTRIN SR  Take 200 mg by mouth 2 (two) times daily.     calcium carbonate 600 MG Tabs  Commonly known as:  OS-CAL  Take 600 mg by mouth daily.     diazepam 5 MG tablet  Commonly known as:  VALIUM  Take 5 mg by mouth every 8 (eight) hours as needed for anxiety.     doxazosin 4 MG tablet  Commonly known as:  CARDURA  Take 4 mg by mouth at bedtime.     fluticasone 50 MCG/ACT nasal spray  Commonly known as:  FLONASE  Place 2 sprays into the nose 2 (two) times daily.     HYDROcodone-acetaminophen 5-325 MG per tablet  Commonly known as:  NORCO/VICODIN  Take 1-2 tablets by mouth every 6 (six) hours as needed.     meclizine 25 MG tablet  Commonly known as:  ANTIVERT  Take 25 mg by mouth daily as needed (vertigo).     MUCINEX MAXIMUM STRENGTH 1200 MG Tb12  Generic drug:  Guaifenesin  Take 1,200 mg by mouth daily.     multivitamin with minerals Tabs  Take 1 tablet by mouth daily.     rOPINIRole 1 MG tablet  Commonly known as:  REQUIP  Take 1 mg by mouth at bedtime.     simvastatin 40 MG tablet  Commonly known as:  ZOCOR  Take 40 mg by mouth at bedtime.     triamterene-hydrochlorothiazide 75-50 MG per tablet  Commonly known as:  MAXZIDE  Take 0.5 tablets by mouth daily.           Follow-up Information   Follow up with [name], MD In 2 weeks. (Please call to make appt)    Contact information:   [address] [place] [address] [phone]         Time coordinating discharge: 25 minutes including medication reconciliation,  preparation of discharge papers, and discussion with patient     Signed: Darnelle Bos 01/23/2013, 8:08 AM

## 2013-01-23 NOTE — Progress Notes (Signed)
Clinical social worker assisted with patient discharge to skilled nursing facility, [facility].  CSW addressed all family questions and concerns. CSW copied chart and added all important documents. CSW also set up patient transportation with [facility]. Clinical Social Worker will sign off for now as social work intervention is no longer needed.   Angelamarie Avakian, MSW [phone] 

## 2013-01-23 NOTE — Progress Notes (Signed)
Physical Therapy Treatment Patient Details Name: Micheal Frank MRN: [id number] DOB: 12-25-1925 Today's Date: 01/23/2013 Time: 3086-5784 PT Time Calculation (min): 23 min  PT Assessment / Plan / Recommendation  PT Comments   Pt. Is highly motivated to do for himself .  Needs cues and assist to maintain TDWB L LE.  Follow Up Recommendations  CIR     Does the patient have the potential to tolerate intense rehabilitation     Barriers to Discharge        Equipment Recommendations  Rolling walker with 5" wheels;3in1 (PT);Wheelchair (measurements PT)    Recommendations for Other Services Rehab consult  Frequency Min 5X/week   Progress towards PT Goals Progress towards PT goals: Progressing toward goals  Plan Current plan remains appropriate    Precautions / Restrictions Precautions Precautions: Fall Restrictions Weight Bearing Restrictions: Yes LLE Weight Bearing: Touchdown weight bearing   Pertinent Vitals/Pain See vitals tab     Mobility  Bed Mobility Bed Mobility: Supine to Sit Supine to Sit: 3: Mod assist;HOB elevated;With rails Sit to Supine: Not Tested (comment) Details for Bed Mobility Assistance: Able to move LEs to edge of bed and using upper body to rise to sitting position.  Says " I want to do this myself" Transfers Transfers: Sit to Stand;Stand to Sit Sit to Stand: 1: +2 Total assist Sit to Stand: Patient Percentage: 60% Stand to Sit: 1: +2 Total assist Stand to Sit: Patient Percentage: 60% Details for Transfer Assistance: cues for technique and hand placement; assist to remain TDWB Ambulation/Gait Ambulation/Gait Assistance: 1: +2 Total assist Ambulation/Gait: Patient Percentage: 70% Ambulation Distance (Feet): 5 Feet Assistive device: Rolling walker Ambulation/Gait Assistance Details: cues and assist to maintain TDWB L  LE as he tends to try to bear some weight thru LE  Gait Pattern: Step-to pattern    Exercises General Exercises - Lower  Extremity Ankle Circles/Pumps: AROM;Both;15 reps Quad Sets: AROM;Left;10 reps;Seated Long Arc Quad: AROM;15 reps;Seated;Left   PT Diagnosis:    PT Problem List:   PT Treatment Interventions:     PT Goals (current goals can now be found in the care plan section)    Visit Information  Last PT Received On: 01/23/13 Assistance Needed: +2 History of Present Illness: 79 yo pt s/p fall 01/16/2013 resulting in L pubic rami fxs and sacral ala fx; TDWBing; Sent home from ER; Unable to manage at home    Subjective Data  Subjective: I should be going to rehab today   Cognition  Cognition Arousal/Alertness: Awake/alert Behavior During Therapy: WFL for tasks assessed/performed Overall Cognitive Status: Within Functional Limits for tasks assessed    Balance     End of Session PT - End of Session Equipment Utilized During Treatment: Gait belt Activity Tolerance: Patient tolerated treatment well Patient left: in chair;with call bell/phone within reach Nurse Communication: Mobility status   GP     Micheal Frank 01/23/2013, 3:26 PM Weldon Picking PT Acute Rehab Services [phone] Beeper [phone]

## 2013-01-23 NOTE — Progress Notes (Signed)
Contacted Dr. Earl Gala about Mr. Apostol UA results, orders received to start cipro.

## 2013-01-26 LAB — URINE CULTURE: Colony Count: >=100000

## 2013-03-11 ENCOUNTER — Ambulatory Visit: Payer: Medicare Other | Attending: Orthopedic Surgery | Admitting: Physical Therapy

## 2013-03-11 DIAGNOSIS — R262 Difficulty in walking, not elsewhere classified: Secondary | ICD-10-CM | POA: Insufficient documentation

## 2013-03-11 DIAGNOSIS — IMO0001 Reserved for inherently not codable concepts without codable children: Secondary | ICD-10-CM | POA: Insufficient documentation

## 2013-03-11 DIAGNOSIS — M25569 Pain in unspecified knee: Secondary | ICD-10-CM | POA: Insufficient documentation

## 2013-03-13 ENCOUNTER — Ambulatory Visit: Payer: Medicare Other | Admitting: Physical Therapy

## 2013-03-18 ENCOUNTER — Ambulatory Visit: Payer: Medicare Other | Attending: Orthopedic Surgery | Admitting: Physical Therapy

## 2013-03-18 DIAGNOSIS — M25569 Pain in unspecified knee: Secondary | ICD-10-CM | POA: Insufficient documentation

## 2013-03-18 DIAGNOSIS — R262 Difficulty in walking, not elsewhere classified: Secondary | ICD-10-CM | POA: Insufficient documentation

## 2013-03-18 DIAGNOSIS — IMO0001 Reserved for inherently not codable concepts without codable children: Secondary | ICD-10-CM | POA: Insufficient documentation

## 2013-03-21 ENCOUNTER — Ambulatory Visit: Payer: Medicare Other | Admitting: Physical Therapy

## 2013-03-26 ENCOUNTER — Ambulatory Visit: Payer: Medicare Other | Admitting: Physical Therapy

## 2013-03-27 ENCOUNTER — Other Ambulatory Visit: Payer: Self-pay | Admitting: Critical Care Medicine

## 2013-03-27 ENCOUNTER — Ambulatory Visit: Payer: Medicare Other | Admitting: Physical Therapy

## 2013-03-27 NOTE — Telephone Encounter (Signed)
Dr. Delford Field, pt was last seen by you on 11/28/12. Requesting rxs for benzonatate and Hycodan. Neither med is on pt's current med list. Are you ok with pt having refills of these meds? Hycodan rx was last given on 11/28/12 # 240 x 0.

## 2013-03-28 NOTE — Telephone Encounter (Signed)
Hycodan rx called into [place] [facility] at it was previously printed.

## 2013-03-31 ENCOUNTER — Ambulatory Visit: Payer: Medicare Other | Admitting: Physical Therapy

## 2013-04-02 ENCOUNTER — Ambulatory Visit: Payer: Medicare Other | Admitting: Physical Therapy

## 2013-04-02 ENCOUNTER — Other Ambulatory Visit: Payer: Self-pay | Admitting: Dermatology

## 2013-04-07 ENCOUNTER — Ambulatory Visit: Payer: Medicare Other | Admitting: Physical Therapy

## 2013-04-07 ENCOUNTER — Ambulatory Visit (INDEPENDENT_AMBULATORY_CARE_PROVIDER_SITE_OTHER): Payer: Medicare Other | Admitting: Critical Care Medicine

## 2013-04-07 ENCOUNTER — Encounter: Payer: Self-pay | Admitting: Critical Care Medicine

## 2013-04-07 VITALS — BP 134/80 | HR 69 | Temp 98.0°F | Ht 69.0 in | Wt 153.5 lb

## 2013-04-07 DIAGNOSIS — J449 Chronic obstructive pulmonary disease, unspecified: Secondary | ICD-10-CM

## 2013-04-07 DIAGNOSIS — Z23 Encounter for immunization: Secondary | ICD-10-CM

## 2013-04-07 DIAGNOSIS — J4489 Other specified chronic obstructive pulmonary disease: Secondary | ICD-10-CM

## 2013-04-07 NOTE — Assessment & Plan Note (Signed)
Gold stage C. COPD stable at this time Note this patient remains very high fall risk Plan Maintain inhaled medications as prescribed A flu vaccine was administered

## 2013-04-07 NOTE — Patient Instructions (Addendum)
A flu vaccine was given No change in medications Return 4 months 

## 2013-04-07 NOTE — Progress Notes (Signed)
Subjective:    Patient ID: Micheal Frank, male    DOB: 1925/12/01, 79 y.o.   MRN: [id number]  HPI  Pulmonary OV  This is an 79 y.o. male with Golds III COPD primary emphysema DLCO 69% FeV1 94% 10/09  This pt has been with [facility] MD in the past. Dx COPD for 20 yrs. Last pfts 11/08 at [facility]:  DLCO 58%. also OSA on CPAP  Prolonged and multiple hospitalizations in 2012.  Infected L TKR 5/12 with Staph aureus. Removed Knee in 8/12,  Complications of epidural abscess, SDH spontaneous from coumadin (6/12), Infected L TKR explanted and finally new TKR 11/12.   DId not have to drain the hematoma in the brain.  Did drain the abscess in the back.     11/28/2012 Cough from before better with protocol.  But now with mucus that wont come up with more productive cough. Mucus now is sl tinted. Notes some pndrip.  Notes sneezing. Dsypnea is satisfactory. No SABA use. Down to one puff advair daily  04/07/2013 Chief Complaint  Patient presents with  . 3 month follow up    Reports breathing is doing fine unless doing activities.  Has DOE and prod cough with clear mucus.  No wheezing, chest tightness, or chest pain.     Larey Seat two months ago, fx pelvis.  At [facility] rehab. Now walks with a cane.  Pt at high fall risk.   Pt notes dyspnea with exertion.  Pt still has deep cough.  Occ prod clear mucus. No yellow/green Past Medical History  Diagnosis Date  . Subdural hematoma 03/30/11  . Epidural abscess   . Staphylococcus aureus bacteremia with sepsis   . Prosthetic joint infection   . Hyperlipidemia   . History of blood clots   . Blood dyscrasia     pt CAN NOT have any blood thinners d/t hx brain bleed  . S/P right heart catheterization     at least 33yrs  . Asthma   . COPD (chronic obstructive pulmonary disease)     emphysema  . Shortness of breath     with exertion  . Pneumonia     as a child and in 1952;had a pneumonia vaccine couple of years ago  . Peripheral neuropathy   .  Short-term memory loss   . Arthritis     left leg also has swelling  . Gastric ulcer   . Constipation     miralax prn  . Depression     takes wellbutrin bid  . Urinary frequency     wears depends  . Complication of anesthesia     confusion with anesthesia  . Sleep apnea     has CPAP but doesn't use it;sleep study done at least 34yrs ago     No family history on file.   History   Social History  . Marital Status: Married    Spouse Name: N/A    Number of Children: N/A  . Years of Education: N/A   Occupational History  . Not on file.   Social History Main Topics  . Smoking status: Former Smoker -- 2.00 packs/day for 40 years    Types: Cigarettes    Quit date: 07/17/1978  . Smokeless tobacco: Never Used  . Alcohol Use: No  . Drug Use: No  . Sexual Activity: Not on file   Other Topics Concern  . Not on file   Social History Narrative  . No narrative on file  Allergies  Allergen Reactions  . Anticoagulant Compound     History of bleeding on the brain per daughter     Outpatient Prescriptions Prior to Visit  Medication Sig Dispense Refill  . albuterol (PROAIR HFA) 108 (90 BASE) MCG/ACT inhaler Inhale 2 puffs into the lungs every 6 (six) hours as needed for wheezing.  1 Inhaler  4  . benzonatate (TESSALON) 200 MG capsule TAKE ONE CAPSULE 3-4 TIMES DAILY AS NEEDED FOR COUGH  60 capsule  0  . buPROPion (WELLBUTRIN SR) 100 MG 12 hr tablet Take 200 mg by mouth 2 (two) times daily.       . calcium carbonate (OS-CAL) 600 MG TABS Take 600 mg by mouth daily.       . diazepam (VALIUM) 5 MG tablet Take 5 mg by mouth every 8 (eight) hours as needed for anxiety.      Marland Kitchen doxazosin (CARDURA) 4 MG tablet Take 4 mg by mouth at bedtime.      . fluticasone (FLONASE) 50 MCG/ACT nasal spray Place 2 sprays into the nose 2 (two) times daily.  16 g  6  . Guaifenesin (MUCINEX MAXIMUM STRENGTH) 1200 MG TB12 Take 1,200 mg by mouth daily.      Marland Kitchen HYDROcodone-acetaminophen (NORCO/VICODIN)  5-325 MG per tablet Take 1-2 tablets by mouth every 6 (six) hours as needed.  30 tablet  0  . HYDROcodone-homatropine (HYCODAN) 5-1.5 MG/5ML syrup TAKE 5 MLS EVERY 6 HOURS AS NEEDED FOR COUGH  240 mL  0  . meclizine (ANTIVERT) 25 MG tablet Take 25 mg by mouth daily as needed (vertigo).       . Multiple Vitamin (MULTIVITAMIN WITH MINERALS) TABS Take 1 tablet by mouth daily.      Marland Kitchen rOPINIRole (REQUIP) 1 MG tablet Take 1 mg by mouth at bedtime.       . simvastatin (ZOCOR) 40 MG tablet Take 40 mg by mouth at bedtime.       . triamterene-hydrochlorothiazide (MAXZIDE) 75-50 MG per tablet Take 0.5 tablets by mouth daily.      . ciprofloxacin (CIPRO) 500 MG tablet Take 1 tablet (500 mg total) by mouth 2 (two) times daily.       No facility-administered medications prior to visit.     Review of Systems Constitutional:   No  weight loss, night sweats,  Fevers, chills, fatigue, lassitude. HEENT:   No headaches,  Difficulty swallowing,  Tooth/dental problems,  Sore throat,                No sneezing, itching, ear ache,  +nasal congestion, post nasal drip,   CV:  No chest pain,  Orthopnea, PND, swelling in lower extremities, anasarca, dizziness, palpitations  GI  No heartburn, indigestion, abdominal pain, nausea, vomiting, diarrhea, change in bowel habits, loss of appetite  Resp: no wheezing.  No chest wall deformity. Notes dry hacky cough  Skin: no rash or lesions.  GU: no dysuria, change in color of urine, no urgency or frequency.  No flank pain.  MS:  No joint pain or swelling.  No decreased range of motion.  No back pain.  Psych:  No change in mood or affect. No depression or anxiety.  No memory loss.     Objective:   Physical Exam  Filed Vitals:   04/07/13 1405  BP: 134/80  Pulse: 69  Temp: 98 F (36.7 C)  TempSrc: Oral  Height: 5\' 9"  (1.753 m)  Weight: 153 lb 8 oz (69.627 kg)  SpO2: 95%  Gen: Pleasant, well-nourished, in no distress,  normal affect  ENT: No lesions,   mouth clear,  oropharynx clear, no postnasal drip,   Neck: No JVD, no TMG, no carotid bruits  Lungs: coarse rhonchi with  no wheezing   Cardiovascular: RRR, heart sounds normal, no murmur or gallops, no peripheral edema  Abdomen: soft and NT, no HSM,  BS normal  Musculoskeletal: No deformities, no cyanosis or clubbing  Neuro: alert, non focal  Skin: Warm, no lesions or rashes        Assessment & Plan:   COPD gold stage C. Gold stage C. COPD stable at this time Note this patient remains very high fall risk Plan Maintain inhaled medications as prescribed A flu vaccine was administered    Updated Medication List Outpatient Encounter Prescriptions as of 04/07/2013  Medication Sig Dispense Refill  . albuterol (PROAIR HFA) 108 (90 BASE) MCG/ACT inhaler Inhale 2 puffs into the lungs every 6 (six) hours as needed for wheezing.  1 Inhaler  4  . benzonatate (TESSALON) 200 MG capsule TAKE ONE CAPSULE 3-4 TIMES DAILY AS NEEDED FOR COUGH  60 capsule  0  . budesonide-formoterol (SYMBICORT) 160-4.5 MCG/ACT inhaler Inhale 2 puffs into the lungs 2 (two) times daily.      Marland Kitchen buPROPion (WELLBUTRIN SR) 100 MG 12 hr tablet Take 200 mg by mouth 2 (two) times daily.       . calcium carbonate (OS-CAL) 600 MG TABS Take 600 mg by mouth daily.       . diazepam (VALIUM) 5 MG tablet Take 5 mg by mouth every 8 (eight) hours as needed for anxiety.      Marland Kitchen doxazosin (CARDURA) 4 MG tablet Take 4 mg by mouth at bedtime.      . fluticasone (FLONASE) 50 MCG/ACT nasal spray Place 2 sprays into the nose 2 (two) times daily.  16 g  6  . Guaifenesin (MUCINEX MAXIMUM STRENGTH) 1200 MG TB12 Take 1,200 mg by mouth daily.      Marland Kitchen HYDROcodone-acetaminophen (NORCO/VICODIN) 5-325 MG per tablet Take 1-2 tablets by mouth every 6 (six) hours as needed.  30 tablet  0  . HYDROcodone-homatropine (HYCODAN) 5-1.5 MG/5ML syrup TAKE 5 MLS EVERY 6 HOURS AS NEEDED FOR COUGH  240 mL  0  . meclizine (ANTIVERT) 25 MG tablet Take 25 mg  by mouth daily as needed (vertigo).       . Multiple Vitamin (MULTIVITAMIN WITH MINERALS) TABS Take 1 tablet by mouth daily.      Marland Kitchen rOPINIRole (REQUIP) 1 MG tablet Take 1 mg by mouth at bedtime.       . simvastatin (ZOCOR) 40 MG tablet Take 40 mg by mouth at bedtime.       . triamterene-hydrochlorothiazide (MAXZIDE) 75-50 MG per tablet Take 0.5 tablets by mouth daily.      . [DISCONTINUED] ciprofloxacin (CIPRO) 500 MG tablet Take 1 tablet (500 mg total) by mouth 2 (two) times daily.       No facility-administered encounter medications on file as of 04/07/2013.

## 2013-04-10 ENCOUNTER — Ambulatory Visit: Payer: Medicare Other | Admitting: Physical Therapy

## 2013-04-11 ENCOUNTER — Other Ambulatory Visit: Payer: Self-pay | Admitting: Critical Care Medicine

## 2013-04-11 NOTE — Telephone Encounter (Signed)
[facility] [place] [facility].

## 2013-04-11 NOTE — Telephone Encounter (Signed)
Dr. Delford Field, received refill request for hycodan from pharm. Rx was last given on 03/27/13 # 240 x 0 Pt was just seen on 9.22.14. Are you ok with hycodan rx?  If so, I will call rx in.   Thank you.

## 2013-04-14 ENCOUNTER — Ambulatory Visit: Payer: Medicare Other | Admitting: Physical Therapy

## 2013-04-16 ENCOUNTER — Ambulatory Visit: Payer: Medicare Other | Admitting: Physical Therapy

## 2013-04-23 ENCOUNTER — Ambulatory Visit: Payer: Medicare Other | Admitting: Physical Therapy

## 2013-05-15 ENCOUNTER — Telehealth: Payer: Self-pay | Admitting: Critical Care Medicine

## 2013-05-15 MED ORDER — BENZONATATE 200 MG PO CAPS: ORAL_CAPSULE | ORAL | Status: DC

## 2013-05-15 MED ORDER — HYDROCODONE-HOMATROPINE 5-1.5 MG/5ML PO SYRP: ORAL_SOLUTION | ORAL | Status: DC

## 2013-05-15 NOTE — Telephone Encounter (Signed)
Pt is requesting written prescriptions for Hycodan syrup and Tessalon.  He is changing pharmacies to try and save money.  Last seen PW on 04/07/13.  Please advise if ok to print rx's.   Sending doc of day Allergies  Allergen Reactions  . Anticoagulant Compound     History of bleeding on the brain per daughter

## 2013-05-15 NOTE — Telephone Encounter (Signed)
Per CY---  Ok to refill.  These have been printed out and placed on CY cart to be signed.  Will call pt once completed.

## 2013-05-15 NOTE — Telephone Encounter (Signed)
Spoke with patient-aware that Rx's have been signed and would like to pick them up. Pt aware that they can be picked up in the [place] office.

## 2013-06-11 ENCOUNTER — Telehealth: Payer: Self-pay | Admitting: Critical Care Medicine

## 2013-06-11 MED ORDER — BENZONATATE 200 MG PO CAPS: ORAL_CAPSULE | ORAL | Status: DC

## 2013-06-11 MED ORDER — HYDROCODONE-HOMATROPINE 5-1.5 MG/5ML PO SYRP: ORAL_SOLUTION | ORAL | Status: DC

## 2013-06-11 NOTE — Telephone Encounter (Signed)
i am ok but pt will also need an OV to review, he is going through too much cough med

## 2013-06-11 NOTE — Telephone Encounter (Signed)
rx printed and placed at front. Pt daughter is aware. Appt set for 07-08-13 in [place]. Carron Curie, CMA

## 2013-06-11 NOTE — Telephone Encounter (Signed)
I called and spoke with Micheal Frank. Pt is needing a refill on hycodan cough syrup and benzonatate. She is aware hycodan RX has to be picked up. Hycodan last refilled 05/15/13 #240 ml x 0 refills and benzonatate last refilled 05/15/13 #60 x 0 refills. Please advise Dr. Delford Field thanks

## 2013-06-30 ENCOUNTER — Telehealth: Payer: Self-pay | Admitting: Critical Care Medicine

## 2013-06-30 NOTE — Telephone Encounter (Signed)
I spoke with pt daughter. She reports pt has been coughing and at times brings up mucus. Pt schedule to see PW in [place] tomorrow at 3:30. Offered TP this AM but reports would not be able to make it. Nothing further needed

## 2013-07-01 ENCOUNTER — Ambulatory Visit (INDEPENDENT_AMBULATORY_CARE_PROVIDER_SITE_OTHER): Payer: Medicare Other | Admitting: Critical Care Medicine

## 2013-07-01 ENCOUNTER — Other Ambulatory Visit: Payer: Self-pay | Admitting: Dermatology

## 2013-07-01 ENCOUNTER — Encounter: Payer: Self-pay | Admitting: Critical Care Medicine

## 2013-07-01 VITALS — BP 136/74 | HR 86 | Temp 98.1°F | Ht 69.5 in | Wt 164.0 lb

## 2013-07-01 DIAGNOSIS — J449 Chronic obstructive pulmonary disease, unspecified: Secondary | ICD-10-CM

## 2013-07-01 DIAGNOSIS — J4489 Other specified chronic obstructive pulmonary disease: Secondary | ICD-10-CM

## 2013-07-01 NOTE — Progress Notes (Signed)
Subjective:    Patient ID: Micheal Frank, male    DOB: 1926-05-24, 79 y.o.   MRN: [id number]  HPI  Pulmonary OV  This is an 79 y.o. male with Golds III COPD primary emphysema DLCO 69% FeV1 94% 10/09  This pt has been with [facility] MD in the past. Dx COPD for 20 yrs. Last pfts 11/08 at [facility]:  DLCO 58%. also OSA on CPAP  Prolonged and multiple hospitalizations in 2012.  Infected L TKR 5/12 with Staph aureus. Removed Knee in 8/12,  Complications of epidural abscess, SDH spontaneous from coumadin (6/12), Infected L TKR explanted and finally new TKR 11/12.   DId not have to drain the hematoma in the brain.  Did drain the abscess in the back.     07/01/2013 Chief Complaint  Patient presents with  . Acute Visit    nonprod cough and congestion x 1 month.  No increased SOB, wheezing, chest tightness/pain, or f/c/s.  Notes a dry cough and more congestion.  Uses mucinex.  Phlegm will not come up.  Does not like the flutter valve. Not any more dyspneic, unles changes clothes. No qhs dyspnea.  No more edema in feet.    Past Medical History  Diagnosis Date  . Subdural hematoma 03/30/11  . Epidural abscess   . Staphylococcus aureus bacteremia with sepsis   . Prosthetic joint infection   . Hyperlipidemia   . History of blood clots   . Blood dyscrasia     pt CAN NOT have any blood thinners d/t hx brain bleed  . S/P right heart catheterization     at least 67yrs  . Asthma   . COPD (chronic obstructive pulmonary disease)     emphysema  . Shortness of breath     with exertion  . Pneumonia     as a child and in 1952;had a pneumonia vaccine couple of years ago  . Peripheral neuropathy   . Short-term memory loss   . Arthritis     left leg also has swelling  . Gastric ulcer   . Constipation     miralax prn  . Depression     takes wellbutrin bid  . Urinary frequency     wears depends  . Complication of anesthesia     confusion with anesthesia  . Sleep apnea     has CPAP but  doesn't use it;sleep study done at least 64yrs ago     No family history on file.   History   Social History  . Marital Status: Married    Spouse Name: N/A    Number of Children: N/A  . Years of Education: N/A   Occupational History  . Not on file.   Social History Main Topics  . Smoking status: Former Smoker -- 2.00 packs/day for 40 years    Types: Cigarettes    Quit date: 07/17/1978  . Smokeless tobacco: Never Used  . Alcohol Use: No  . Drug Use: No  . Sexual Activity: Not on file   Other Topics Concern  . Not on file   Social History Narrative  . No narrative on file     Allergies  Allergen Reactions  . Anticoagulant Compound     History of bleeding on the brain per daughter     Outpatient Prescriptions Prior to Visit  Medication Sig Dispense Refill  . albuterol (PROAIR HFA) 108 (90 BASE) MCG/ACT inhaler Inhale 2 puffs into the lungs every 6 (six) hours as  needed for wheezing.  1 Inhaler  4  . benzonatate (TESSALON) 200 MG capsule TAKE ONE CAPSULE 3-4 TIMES DAILY AS NEEDED FOR COUGH  60 capsule  0  . budesonide-formoterol (SYMBICORT) 160-4.5 MCG/ACT inhaler Inhale 2 puffs into the lungs 2 (two) times daily.      . calcium carbonate (OS-CAL) 600 MG TABS Take 600 mg by mouth daily.       Marland Kitchen doxazosin (CARDURA) 4 MG tablet Take 4 mg by mouth at bedtime.      . fluticasone (FLONASE) 50 MCG/ACT nasal spray Place 2 sprays into the nose 2 (two) times daily.  16 g  6  . Guaifenesin (MUCINEX MAXIMUM STRENGTH) 1200 MG TB12 Take 1,200 mg by mouth daily.      Marland Kitchen HYDROcodone-acetaminophen (NORCO/VICODIN) 5-325 MG per tablet Take 1-2 tablets by mouth every 6 (six) hours as needed.  30 tablet  0  . meclizine (ANTIVERT) 25 MG tablet Take 25 mg by mouth daily as needed (vertigo).       . Multiple Vitamin (MULTIVITAMIN WITH MINERALS) TABS Take 1 tablet by mouth daily.      Marland Kitchen rOPINIRole (REQUIP) 1 MG tablet Take 1 mg by mouth at bedtime.       . simvastatin (ZOCOR) 40 MG tablet Take  40 mg by mouth at bedtime.       Marland Kitchen HYDROcodone-homatropine (HYCODAN) 5-1.5 MG/5ML syrup TAKE 5 MLS EVERY 6 HOURS AS NEEDED FOR COUGH  240 mL  0  . buPROPion (WELLBUTRIN SR) 100 MG 12 hr tablet Take 200 mg by mouth 2 (two) times daily.       . diazepam (VALIUM) 5 MG tablet Take 5 mg by mouth every 8 (eight) hours as needed for anxiety.      . triamterene-hydrochlorothiazide (MAXZIDE) 75-50 MG per tablet Take 0.5 tablets by mouth daily.       No facility-administered medications prior to visit.     Review of Systems Constitutional:   No  weight loss, night sweats,  Fevers, chills, fatigue, lassitude. HEENT:   No headaches,  Difficulty swallowing,  Tooth/dental problems,  Sore throat,                No sneezing, itching, ear ache,  +nasal congestion, post nasal drip,   CV:  No chest pain,  Orthopnea, PND, swelling in lower extremities, anasarca, dizziness, palpitations  GI  No heartburn, indigestion, abdominal pain, nausea, vomiting, diarrhea, change in bowel habits, loss of appetite  Resp: no wheezing.  No chest wall deformity. Notes dry hacky cough  Skin: no rash or lesions.  GU: no dysuria, change in color of urine, no urgency or frequency.  No flank pain.  MS:  No joint pain or swelling.  No decreased range of motion.  No back pain.  Psych:  No change in mood or affect. No depression or anxiety.  No memory loss.     Objective:   Physical Exam  Filed Vitals:   07/01/13 1526  BP: 136/74  Pulse: 86  Temp: 98.1 F (36.7 C)  TempSrc: Oral  Height: 5' 9.5" (1.765 m)  Weight: 164 lb (74.39 kg)  SpO2: 90%    Gen: Pleasant, well-nourished, in no distress,  normal affect  ENT: No lesions,  mouth clear,  oropharynx clear, no postnasal drip,   Neck: No JVD, no TMG, no carotid bruits  Lungs: coarse rhonchi with  no wheezing   Cardiovascular: RRR, heart sounds normal, no murmur or gallops, no peripheral edema  Abdomen:  soft and NT, no HSM,  BS normal  Musculoskeletal: No  deformities, no cyanosis or clubbing  Neuro: alert, non focal  Skin: Warm, no lesions or rashes   HFA technique improves to 80% efficiency with coaching     Assessment & Plan:   COPD gold stage C. Gold stage C. COPD with no evidence of active exacerbation Plan Maintain inhaled medications as prescribed No additional adjustments made Note the patient's HFA technique was inadequate was improved with coaching to 80% efficiency    Updated Medication List Outpatient Encounter Prescriptions as of 07/01/2013  Medication Sig  . albuterol (PROAIR HFA) 108 (90 BASE) MCG/ACT inhaler Inhale 2 puffs into the lungs every 6 (six) hours as needed for wheezing.  . benzonatate (TESSALON) 200 MG capsule TAKE ONE CAPSULE 3-4 TIMES DAILY AS NEEDED FOR COUGH  . budesonide-formoterol (SYMBICORT) 160-4.5 MCG/ACT inhaler Inhale 2 puffs into the lungs 2 (two) times daily.  Marland Kitchen buPROPion (WELLBUTRIN SR) 200 MG 12 hr tablet Take 200 mg by mouth 2 (two) times daily.  . calcium carbonate (OS-CAL) 600 MG TABS Take 600 mg by mouth daily.   Marland Kitchen Dextromethorphan-Guaifenesin (TUSSIN DM MAX ADULT) 10-200 MG/5ML LIQD Take by mouth 3 (three) times daily.  Marland Kitchen doxazosin (CARDURA) 4 MG tablet Take 4 mg by mouth at bedtime.  . fluticasone (FLONASE) 50 MCG/ACT nasal spray Place 2 sprays into the nose 2 (two) times daily.  . Guaifenesin (MUCINEX MAXIMUM STRENGTH) 1200 MG TB12 Take 1,200 mg by mouth daily.  Marland Kitchen HYDROcodone-acetaminophen (NORCO/VICODIN) 5-325 MG per tablet Take 1-2 tablets by mouth every 6 (six) hours as needed.  . meclizine (ANTIVERT) 25 MG tablet Take 25 mg by mouth daily as needed (vertigo).   . Multiple Vitamin (MULTIVITAMIN WITH MINERALS) TABS Take 1 tablet by mouth daily.  Marland Kitchen oxyCODONE (OXY IR/ROXICODONE) 5 MG immediate release tablet Take 5 mg by mouth as needed for severe pain.  Marland Kitchen rOPINIRole (REQUIP) 1 MG tablet Take 1 mg by mouth at bedtime.   . simvastatin (ZOCOR) 40 MG tablet Take 40 mg by mouth at  bedtime.   . triamterene-hydrochlorothiazide (MAXZIDE-25) 37.5-25 MG per tablet Take 0.5 tablets by mouth daily.  Marland Kitchen HYDROcodone-homatropine (HYCODAN) 5-1.5 MG/5ML syrup TAKE 5 MLS EVERY 6 HOURS AS NEEDED FOR COUGH  . [DISCONTINUED] buPROPion (WELLBUTRIN SR) 100 MG 12 hr tablet Take 200 mg by mouth 2 (two) times daily.   . [DISCONTINUED] diazepam (VALIUM) 5 MG tablet Take 5 mg by mouth every 8 (eight) hours as needed for anxiety.  . [DISCONTINUED] triamterene-hydrochlorothiazide (MAXZIDE) 75-50 MG per tablet Take 0.5 tablets by mouth daily.

## 2013-07-01 NOTE — Patient Instructions (Signed)
No change in medications. Return in         3 months Work on your inhaler technique, slow deep breath

## 2013-07-01 NOTE — Assessment & Plan Note (Signed)
Gold stage C. COPD with no evidence of active exacerbation Plan Maintain inhaled medications as prescribed No additional adjustments made Note the patient's HFA technique was inadequate was improved with coaching to 80% efficiency

## 2013-07-04 ENCOUNTER — Telehealth: Payer: Self-pay | Admitting: Critical Care Medicine

## 2013-07-04 NOTE — Telephone Encounter (Signed)
I spoke with pt daughter and she wants to come pick up rx for hycodan cough syrum and benzonatate on Monday in [place] office. I advised PW will be here in the afternoon after 1:30. I advised we will send him a message to get the ok to write Rx and we will let her know when this is done.   Hycodan COugh Syrup last fill 11-26 #230ml Benzonatate last fill 11-26 #60. Please advise if ok to fill. Carron Curie, CMA

## 2013-07-06 NOTE — Telephone Encounter (Signed)
i am ok with writing these Rx

## 2013-07-07 MED ORDER — HYDROCODONE-HOMATROPINE 5-1.5 MG/5ML PO SYRP: ORAL_SOLUTION | ORAL | Status: DC

## 2013-07-07 MED ORDER — BENZONATATE 200 MG PO CAPS: ORAL_CAPSULE | ORAL | Status: DC

## 2013-07-07 NOTE — Telephone Encounter (Signed)
Spoke to pt's daughter. Advised her that PW is fine with doing these rx's but he would not be in the office until this afternoon. She agreed and verbalized understanding.  Will route message to Crystal so she can get these rx's for PW to sign when he comes in this afternoon.

## 2013-07-07 NOTE — Telephone Encounter (Signed)
Hycodan and benzonatate rxs were signed by Dr. Delford Field.   I spoke with pt's daughter, Larita Fife.   They would like to pick up rxs tomorrow at [facility]. They are aware they will be available tomorrow after 1:30 pm in [place]. Larita Fife verbalized understanding and voiced no further questions or concerns at this time.

## 2013-07-08 ENCOUNTER — Ambulatory Visit: Payer: Medicare Other | Admitting: Critical Care Medicine

## 2013-08-25 ENCOUNTER — Ambulatory Visit (INDEPENDENT_AMBULATORY_CARE_PROVIDER_SITE_OTHER): Payer: Medicare Other | Admitting: Adult Health

## 2013-08-25 ENCOUNTER — Encounter: Payer: Self-pay | Admitting: Adult Health

## 2013-08-25 VITALS — BP 124/64 | HR 73 | Temp 97.7°F | Ht 69.0 in | Wt 167.4 lb

## 2013-08-25 DIAGNOSIS — J4489 Other specified chronic obstructive pulmonary disease: Secondary | ICD-10-CM

## 2013-08-25 DIAGNOSIS — J449 Chronic obstructive pulmonary disease, unspecified: Secondary | ICD-10-CM

## 2013-08-25 MED ORDER — BENZONATATE 200 MG PO CAPS: ORAL_CAPSULE | ORAL | Status: DC

## 2013-08-25 MED ORDER — AZITHROMYCIN 250 MG PO TABS: ORAL_TABLET | ORAL | Status: AC

## 2013-08-25 MED ORDER — HYDROCODONE-HOMATROPINE 5-1.5 MG/5ML PO SYRP: ORAL_SOLUTION | ORAL | Status: DC

## 2013-08-25 NOTE — Patient Instructions (Addendum)
Zpack to have on hold if symptoms worsen with discolored mucus.  Mucinex DM Twice daily  As needed  Cough/congesiton  May use Tessalon Three times a day  As needed  cogh  May use Hydromet 1/2 -1 tsp every 8hrs as needed for cough, may make you sleepy-use with caution.  Please contact office for sooner follow up if symptoms do not improve or worsen or seek emergency care  follow up Dr. Joya Gaskins  as planned and As needed

## 2013-08-25 NOTE — Assessment & Plan Note (Signed)
Mild flare w/ URI   Plan  Zpack to have on hold if symptoms worsen with discolored mucus.  Mucinex DM Twice daily  As needed  Cough/congesiton  May use Tessalon Three times a day  As needed  cogh  May use Hydromet 1/2 -1 tsp every 8hrs as needed for cough, may make you sleepy-use with caution.  Please contact office for sooner follow up if symptoms do not improve or worsen or seek emergency care  follow up Dr. Joya Gaskins  as planned and As needed

## 2013-08-25 NOTE — Progress Notes (Signed)
Subjective:    Patient ID: Micheal Frank, male    DOB: [birth year], 80 y.o.   MRN: [id number]  HPI  Pulmonary OV  This is an 80 y.o. male with Golds III COPD primary emphysema DLCO 69% FeV1 94% 10/09  This pt has been with [facility] MD in the past. Dx COPD for 20 yrs. Last pfts 11/08 at [facility]:  DLCO 58%. also OSA on CPAP  Prolonged and multiple hospitalizations in 2012.  Infected L TKR 5/12 with Staph aureus. Removed Knee in 1/32,  Complications of epidural abscess, SDH spontaneous from coumadin (6/12), Infected L TKR explanted and finally new TKR 11/12.   DId not have to drain the hematoma in the brain.  Did drain the abscess in the back.     07/01/2013 Chief Complaint  Patient presents with  . Acute Visit    nonprod cough and congestion x 1 month.  No increased SOB, wheezing, chest tightness/pain, or f/c/s.  Notes a dry cough and more congestion.  Uses mucinex.  Phlegm will not come up.  Does not like the flutter valve. Not any more dyspneic, unles changes clothes. No qhs dyspnea.  No more edema in feet.   >>no changes   08/25/2013 Acute OV  Complains of PW of congesiton  dry cough x2-3 days.  Ran out of cough meds. Wants tessalon / hydromet refilled.  Not taking mucinex .  Denies f/c/s, wheezing, tightness, nausea, vomiting, hemoptysis, head congestion, PND.   Or discolored mucus.  Would like refill on cough meds.  Wife passed away over the holiday-she was very sick.    Past Medical History  Diagnosis Date  . Subdural hematoma 03/30/11  . Epidural abscess   . Staphylococcus aureus bacteremia with sepsis   . Prosthetic joint infection   . Hyperlipidemia   . History of blood clots   . Blood dyscrasia     pt CAN NOT have any blood thinners d/t hx brain bleed  . S/P right heart catheterization     at least 56yrs  . Asthma   . COPD (chronic obstructive pulmonary disease)     emphysema  . Shortness of breath     with exertion  . Pneumonia     as a child and in  1952;had a pneumonia vaccine couple of years ago  . Peripheral neuropathy   . Short-term memory loss   . Arthritis     left leg also has swelling  . Gastric ulcer   . Constipation     miralax prn  . Depression     takes wellbutrin bid  . Urinary frequency     wears depends  . Complication of anesthesia     confusion with anesthesia  . Sleep apnea     has CPAP but doesn't use it;sleep study done at least 44yrs ago     No family history on file.   History   Social History  . Marital Status: Married    Spouse Name: N/A    Number of Children: N/A  . Years of Education: N/A   Occupational History  . Not on file.   Social History Main Topics  . Smoking status: Former Smoker -- 2.00 packs/day for 40 years    Types: Cigarettes    Quit date: 07/17/1978  . Smokeless tobacco: Never Used  . Alcohol Use: No  . Drug Use: No  . Sexual Activity: Not on file   Other Topics Concern  . Not on file  Social History Narrative  . No narrative on file     Allergies  Allergen Reactions  . Anticoagulant Compound     History of bleeding on the brain per daughter     Outpatient Prescriptions Prior to Visit  Medication Sig Dispense Refill  . albuterol (PROAIR HFA) 108 (90 BASE) MCG/ACT inhaler Inhale 2 puffs into the lungs every 6 (six) hours as needed for wheezing.  1 Inhaler  4  . benzonatate (TESSALON) 200 MG capsule TAKE ONE CAPSULE 3-4 TIMES DAILY AS NEEDED FOR COUGH  60 capsule  3  . budesonide-formoterol (SYMBICORT) 160-4.5 MCG/ACT inhaler Inhale 2 puffs into the lungs 2 (two) times daily.      Marland Kitchen buPROPion (WELLBUTRIN SR) 200 MG 12 hr tablet Take 200 mg by mouth 2 (two) times daily.      . calcium carbonate (OS-CAL) 600 MG TABS Take 600 mg by mouth daily.       Marland Kitchen Dextromethorphan-Guaifenesin (TUSSIN DM MAX ADULT) 10-200 MG/5ML LIQD Take by mouth 3 (three) times daily.      Marland Kitchen doxazosin (CARDURA) 4 MG tablet Take 4 mg by mouth at bedtime.      . fluticasone (FLONASE) 50 MCG/ACT  nasal spray Place 2 sprays into the nose 2 (two) times daily.  16 g  6  . Guaifenesin (MUCINEX MAXIMUM STRENGTH) 1200 MG TB12 Take 1,200 mg by mouth daily.      Marland Kitchen HYDROcodone-acetaminophen (NORCO/VICODIN) 5-325 MG per tablet Take 1-2 tablets by mouth every 6 (six) hours as needed.  30 tablet  0  . HYDROcodone-homatropine (HYCODAN) 5-1.5 MG/5ML syrup TAKE 5 MLS EVERY 6 HOURS AS NEEDED FOR COUGH  240 mL  0  . meclizine (ANTIVERT) 25 MG tablet Take 25 mg by mouth daily as needed (vertigo).       . Multiple Vitamin (MULTIVITAMIN WITH MINERALS) TABS Take 1 tablet by mouth daily.      Marland Kitchen oxyCODONE (OXY IR/ROXICODONE) 5 MG immediate release tablet Take 5 mg by mouth as needed for severe pain.      Marland Kitchen rOPINIRole (REQUIP) 1 MG tablet Take 1 mg by mouth at bedtime.       . simvastatin (ZOCOR) 40 MG tablet Take 40 mg by mouth at bedtime.       . triamterene-hydrochlorothiazide (MAXZIDE-25) 37.5-25 MG per tablet Take 0.5 tablets by mouth daily.       No facility-administered medications prior to visit.     Review of Systems Constitutional:   No  weight loss, night sweats,  Fevers, chills, fatigue, lassitude. HEENT:   No headaches,  Difficulty swallowing,  Tooth/dental problems,  Sore throat,                No sneezing, itching, ear ache,  +nasal congestion, post nasal drip,   CV:  No chest pain,  Orthopnea, PND, swelling in lower extremities, anasarca, dizziness, palpitations  GI  No heartburn, indigestion, abdominal pain, nausea, vomiting, diarrhea, change in bowel habits, loss of appetite  Resp: no wheezing.  No chest wall deformity. Notes dry hacky cough  Skin: no rash or lesions.  GU: no dysuria, change in color of urine, no urgency or frequency.  No flank pain.  MS:  No joint pain or swelling.  No decreased range of motion.  No back pain.  Psych:  No change in mood or affect. No depression or anxiety.  No memory loss.     Objective:   Physical Exam  Filed Vitals:   08/25/13 1508  BP:  124/64  Pulse: 73  Temp: 97.7 F (36.5 C)  TempSrc: Oral  Height: 5\' 9"  (1.753 m)  Weight: 167 lb 6.4 oz (75.932 kg)  SpO2: 95%    Gen: Pleasant, elderly  in no distress,  normal affect  ENT: No lesions,  mouth clear,  oropharynx clear, no postnasal drip,   Neck: No JVD, no TMG, no carotid bruits  Lungs:BS diminshed in bases ,  with  no wheezing   Cardiovascular: RRR, heart sounds normal, no murmur or gallops, no peripheral edema  Abdomen: soft and NT, no HSM,  BS normal  Musculoskeletal: No deformities, no cyanosis or clubbing  Neuro: alert, non focal  Skin: Warm, no lesions or rashes   HFA technique improves to 80% efficiency with coaching     Assessment & Plan:   No problem-specific assessment & plan notes found for this encounter.   Updated Medication List Outpatient Encounter Prescriptions as of 08/25/2013  Medication Sig  . albuterol (PROAIR HFA) 108 (90 BASE) MCG/ACT inhaler Inhale 2 puffs into the lungs every 6 (six) hours as needed for wheezing.  . benzonatate (TESSALON) 200 MG capsule TAKE ONE CAPSULE 3-4 TIMES DAILY AS NEEDED FOR COUGH  . budesonide-formoterol (SYMBICORT) 160-4.5 MCG/ACT inhaler Inhale 2 puffs into the lungs 2 (two) times daily.  Marland Kitchen buPROPion (WELLBUTRIN SR) 200 MG 12 hr tablet Take 200 mg by mouth 2 (two) times daily.  . calcium carbonate (OS-CAL) 600 MG TABS Take 600 mg by mouth daily.   Marland Kitchen Dextromethorphan-Guaifenesin (TUSSIN DM MAX ADULT) 10-200 MG/5ML LIQD Take by mouth 3 (three) times daily.  Marland Kitchen doxazosin (CARDURA) 4 MG tablet Take 4 mg by mouth at bedtime.  . fluticasone (FLONASE) 50 MCG/ACT nasal spray Place 2 sprays into the nose 2 (two) times daily.  . Guaifenesin (MUCINEX MAXIMUM STRENGTH) 1200 MG TB12 Take 1,200 mg by mouth daily.  Marland Kitchen HYDROcodone-acetaminophen (NORCO/VICODIN) 5-325 MG per tablet Take 1-2 tablets by mouth every 6 (six) hours as needed.  Marland Kitchen HYDROcodone-homatropine (HYCODAN) 5-1.5 MG/5ML syrup TAKE 5 MLS EVERY 6 HOURS AS  NEEDED FOR COUGH  . meclizine (ANTIVERT) 25 MG tablet Take 25 mg by mouth daily as needed (vertigo).   . Multiple Vitamin (MULTIVITAMIN WITH MINERALS) TABS Take 1 tablet by mouth daily.  Marland Kitchen oxyCODONE (OXY IR/ROXICODONE) 5 MG immediate release tablet Take 5 mg by mouth as needed for severe pain.  Marland Kitchen rOPINIRole (REQUIP) 1 MG tablet Take 1 mg by mouth at bedtime.   . simvastatin (ZOCOR) 40 MG tablet Take 40 mg by mouth at bedtime.   . triamterene-hydrochlorothiazide (MAXZIDE-25) 37.5-25 MG per tablet Take 0.5 tablets by mouth daily.

## 2013-09-16 ENCOUNTER — Telehealth: Payer: Self-pay | Admitting: Critical Care Medicine

## 2013-09-16 ENCOUNTER — Other Ambulatory Visit: Payer: Self-pay | Admitting: Internal Medicine

## 2013-09-16 ENCOUNTER — Ambulatory Visit
Admission: RE | Admit: 2013-09-16 | Discharge: 2013-09-16 | Disposition: A | Discharge status: Home / Self Care | Payer: Medicare Other | Source: Ambulatory Visit | Attending: Internal Medicine | Admitting: Internal Medicine

## 2013-09-16 DIAGNOSIS — C439 Malignant melanoma of skin, unspecified: Secondary | ICD-10-CM

## 2013-09-16 MED ORDER — HYDROCODONE-HOMATROPINE 5-1.5 MG/5ML PO SYRP: ORAL_SOLUTION | ORAL | Status: DC

## 2013-09-16 NOTE — Telephone Encounter (Signed)
Pt last saw TP on 08/25/13. Hycodan was filled at this appointment for #265mL. Requesting refill.  TP - please advise in PW's absence. Thanks.

## 2013-09-16 NOTE — Telephone Encounter (Signed)
Patient's daughter calling back asking if patient can get a bigger bottle.  They are having to get refilled every 2 weeks.

## 2013-09-16 NOTE — Telephone Encounter (Signed)
Per TP: okay to fill Hydromet #274mL, 0 refills.  Will have to pick up rx.  Called spoke with pt's daughter [name].  Mariana Single that #257mL is the most we will rx at a given time.  [name] okay with this and verbalized her understanding.  [name] does request that the rx be sent to [facility] since this is a more convenient location to pick up the rx.  TP and RA are there on Thursday and she is okay with waiting until then.  Rx printed and signed by TP.  Will hold rx at my desk and send to [facility] on 3.5.15.  Nothing further needed; will sign off.

## 2013-10-07 ENCOUNTER — Other Ambulatory Visit: Payer: Self-pay | Admitting: Dermatology

## 2013-12-25 ENCOUNTER — Encounter: Payer: Self-pay | Admitting: Critical Care Medicine

## 2013-12-25 ENCOUNTER — Ambulatory Visit: Payer: Medicare Other | Admitting: Critical Care Medicine

## 2013-12-25 ENCOUNTER — Ambulatory Visit (INDEPENDENT_AMBULATORY_CARE_PROVIDER_SITE_OTHER): Payer: Medicare Other | Admitting: Critical Care Medicine

## 2013-12-25 VITALS — BP 128/76 | HR 79 | Ht 69.0 in | Wt 167.0 lb

## 2013-12-25 DIAGNOSIS — Z23 Encounter for immunization: Secondary | ICD-10-CM

## 2013-12-25 DIAGNOSIS — J449 Chronic obstructive pulmonary disease, unspecified: Secondary | ICD-10-CM

## 2013-12-25 NOTE — Assessment & Plan Note (Signed)
Gold C Copd stable at present Plan No change in inhaled or maintenance medications.

## 2013-12-25 NOTE — Progress Notes (Signed)
Subjective:    Patient ID: Micheal Frank, male    DOB: [birth year], 80 y.o.   MRN: [id number]  HPI  Pulmonary OV  This is an 80 y.o. male with Golds III COPD primary emphysema DLCO 69% FeV1 94% 10/09  This pt has been with [facility] in the past. Dx COPD for 20 yrs. Last pfts 11/08 at [facility]:  DLCO 58%. also OSA on CPAP  Prolonged and multiple hospitalizations in 2012.  Infected L TKR 5/12 with Staph aureus. Removed Knee in 9/21,  Complications of epidural abscess, SDH spontaneous from coumadin (6/12), Infected L TKR explanted and finally new TKR 11/12.   DId not have to drain the hematoma in the brain.  Did drain the abscess in the back.     12/25/2013 Chief Complaint  Patient presents with  . Follow-up    Pt c/o SOB with exertion, especially with exercize or yardwork.  States he has difficulty with bringing up mucous when coughing.  CAT score 13  Some dyspnea with dressing and yardwork  No chest pain No real wheeze.  Cough is under control. Pt denies any significant sore throat, nasal congestion or excess secretions, fever, chills, sweats, unintended weight loss, pleurtic or exertional chest pain, orthopnea PND, or leg swelling Pt denies any increase in rescue therapy over baseline, denies waking up needing it or having any early am or nocturnal exacerbations of coughing/wheezing/or dyspnea. Pt also denies any obvious fluctuation in symptoms with  weather or environmental change or other alleviating or aggravating factors  Review of Systems Constitutional:   No  weight loss, night sweats,  Fevers, chills, fatigue, lassitude. HEENT:   No headaches,  Difficulty swallowing,  Tooth/dental problems,  Sore throat,                No sneezing, itching, ear ache,  +nasal congestion, post nasal drip,   CV:  No chest pain,  Orthopnea, PND, swelling in lower extremities, anasarca, dizziness, palpitations  GI  No heartburn, indigestion, abdominal pain, nausea, vomiting, diarrhea, change  in bowel habits, loss of appetite  Resp: no wheezing.  No chest wall deformity. Notes dry hacky cough  Skin: no rash or lesions.  GU: no dysuria, change in color of urine, no urgency or frequency.  No flank pain.  MS:  No joint pain or swelling.  No decreased range of motion.  No back pain.  Psych:  No change in mood or affect. No depression or anxiety.  No memory loss.     Objective:   Physical Exam  Filed Vitals:   12/25/13 1126  BP: 128/76  Pulse: 79  Height: 5\' 9"  (1.753 m)  Weight: 167 lb (75.751 kg)  SpO2: 95%    Gen: Pleasant, elderly  in no distress,  normal affect  ENT: No lesions,  mouth clear,  oropharynx clear, no postnasal drip,   Neck: No JVD, no TMG, no carotid bruits  Lungs:BS diminshed in bases ,  with  no wheezing   Cardiovascular: RRR, heart sounds normal, no murmur or gallops, no peripheral edema  Abdomen: soft and NT, no HSM,  BS normal  Musculoskeletal: No deformities, no cyanosis or clubbing  Neuro: alert, non focal  Skin: Warm, no lesions or rashes   HFA technique improves to 80% efficiency with coaching     Assessment & Plan:   COPD gold stage C. Gold C Copd stable at present Plan No change in inhaled or maintenance medications.  Updated Medication List Outpatient Encounter Prescriptions as of 12/25/2013  Medication Sig  . albuterol (PROAIR HFA) 108 (90 BASE) MCG/ACT inhaler Inhale 2 puffs into the lungs every 6 (six) hours as needed for wheezing.  . benzonatate (TESSALON) 200 MG capsule TAKE ONE CAPSULE 3-4 TIMES DAILY AS NEEDED FOR COUGH  . budesonide-formoterol (SYMBICORT) 160-4.5 MCG/ACT inhaler Inhale 2 puffs into the lungs 2 (two) times daily.  Marland Kitchen buPROPion (WELLBUTRIN SR) 200 MG 12 hr tablet Take 200 mg by mouth 2 (two) times daily.  . calcium carbonate (OS-CAL) 600 MG TABS Take 600 mg by mouth daily.   Marland Kitchen doxazosin (CARDURA) 4 MG tablet Take 4 mg by mouth at bedtime.  . fluticasone (FLONASE) 50 MCG/ACT nasal spray  Place 2 sprays into the nose 2 (two) times daily.  . Guaifenesin (MUCINEX MAXIMUM STRENGTH) 1200 MG TB12 Take 1,200 mg by mouth daily.  Marland Kitchen HYDROcodone-acetaminophen (NORCO/VICODIN) 5-325 MG per tablet Take 1-2 tablets by mouth every 6 (six) hours as needed.  . meclizine (ANTIVERT) 25 MG tablet Take 25 mg by mouth daily as needed (vertigo).   . montelukast (SINGULAIR) 10 MG tablet Take 10 mg by mouth at bedtime as needed.  . Multiple Vitamin (MULTIVITAMIN WITH MINERALS) TABS Take 1 tablet by mouth daily.  Marland Kitchen oxyCODONE (OXY IR/ROXICODONE) 5 MG immediate release tablet Take 5 mg by mouth as needed for severe pain.  Marland Kitchen rOPINIRole (REQUIP) 1 MG tablet Take 1 mg by mouth at bedtime.   . simvastatin (ZOCOR) 40 MG tablet Take 40 mg by mouth at bedtime.   . triamterene-hydrochlorothiazide (MAXZIDE-25) 37.5-25 MG per tablet Take 0.5 tablets by mouth daily.  Marland Kitchen HYDROcodone-homatropine (HYCODAN) 5-1.5 MG/5ML syrup TAKE 5 MLS EVERY 6 HOURS AS NEEDED FOR COUGH  . [DISCONTINUED] Dextromethorphan-Guaifenesin (TUSSIN DM MAX ADULT) 10-200 MG/5ML LIQD Take by mouth 3 (three) times daily.

## 2013-12-25 NOTE — Patient Instructions (Addendum)
No change in medications Return 4 months Prevnar 13 was given

## 2013-12-29 ENCOUNTER — Telehealth: Payer: Self-pay | Admitting: Critical Care Medicine

## 2013-12-29 ENCOUNTER — Other Ambulatory Visit: Payer: Self-pay | Admitting: Adult Health

## 2013-12-29 MED ORDER — BENZONATATE 200 MG PO CAPS: ORAL_CAPSULE | ORAL | Status: DC

## 2013-12-29 NOTE — Telephone Encounter (Signed)
Spoke with the pt's daughter [name] She is requesting refill on tessalon pearles for the pt  I have sent this to pharm  Nothing further needed

## 2014-01-08 ENCOUNTER — Telehealth: Payer: Self-pay | Admitting: Critical Care Medicine

## 2014-01-08 MED ORDER — BENZONATATE 200 MG PO CAPS: ORAL_CAPSULE | ORAL | Status: DC

## 2014-01-08 NOTE — Telephone Encounter (Signed)
Called and spoke with the pharmacy and they did not get the rx for the tessalon that was sent in on 6/15.  i gave this over the phone to the pharmacist.    i called lynn and spoke with her and she is aware that the pts rx for the tessalon has been given to the pharmacy.  Nothing further is needed.

## 2014-01-19 ENCOUNTER — Telehealth: Payer: Self-pay | Admitting: Critical Care Medicine

## 2014-01-19 ENCOUNTER — Encounter: Payer: Self-pay | Admitting: Internal Medicine

## 2014-01-19 ENCOUNTER — Ambulatory Visit (INDEPENDENT_AMBULATORY_CARE_PROVIDER_SITE_OTHER): Payer: Medicare Other | Admitting: Internal Medicine

## 2014-01-19 ENCOUNTER — Ambulatory Visit (INDEPENDENT_AMBULATORY_CARE_PROVIDER_SITE_OTHER)
Admission: RE | Admit: 2014-01-19 | Discharge: 2014-01-19 | Disposition: A | Discharge status: Home / Self Care | Payer: Medicare Other | Source: Ambulatory Visit | Attending: Internal Medicine | Admitting: Internal Medicine

## 2014-01-19 VITALS — BP 130/66 | HR 77 | Temp 97.7°F | Ht 69.0 in | Wt 169.0 lb

## 2014-01-19 DIAGNOSIS — R059 Cough, unspecified: Secondary | ICD-10-CM

## 2014-01-19 DIAGNOSIS — R05 Cough: Secondary | ICD-10-CM

## 2014-01-19 DIAGNOSIS — J449 Chronic obstructive pulmonary disease, unspecified: Secondary | ICD-10-CM

## 2014-01-19 NOTE — Telephone Encounter (Signed)
Called spoke with pt. He scheduled an appt to come in and see MW this afternoon for acute visit in [place] office. Nothing further needed

## 2014-01-19 NOTE — Progress Notes (Signed)
Subjective:    Patient ID: Micheal Frank, male    DOB: [birth year], 80 y.o.   MRN: [id number]  HPI  Pulmonary OV  This is an 80 y.o. male with Golds III COPD primary emphysema DLCO 69% FeV1 94% 10/09  This pt has been with [facility] in the past. Dx COPD for 20 yrs. Last pfts 11/08 at [facility]:  DLCO 58%. also OSA on CPAP  Prolonged and multiple hospitalizations in 2012.  Infected L TKR 5/12 with Staph aureus. Removed Knee in 8/12,  Complications of epidural abscess, SDH spontaneous from coumadin (6/12), Infected L TKR explanted and finally new TKR 11/12.   DId not have to drain the hematoma in the brain.  Did drain the abscess in the back.     12/25/2013 Chief Complaint  Patient presents with  . Follow-up    Pt c/o SOB with exertion, especially with exercize or yardwork.  States he has difficulty with bringing up mucous when coughing.  CAT score 13  Some dyspnea with dressing and yardwork  No chest pain No real wheeze.  Cough is under control. rec No change in medications Return 4 months Prevnar 13 was given  01/19/2014 acute ov/Nabor Thomann re: cough ? Related to aecopd Chief Complaint  Patient presents with  . Acute Visit    Pt c/o increased cough x 4 days- started after eating dinner. Cough is non prod.  He states that singulair helps.   acute cough onset 01/17/14 30 min p supper >  Started singulair one prior to OV  And much better p an hour. Very confused with maint vs prns ? Taking tessilon as maint.  No excess mucus, no sob> baseline doe  No obvious day to day or daytime variabilty or assoc   cp or chest tightness, subjective wheeze overt sinus or hb symptoms. No unusual exp hx or h/o childhood pna/ asthma or knowledge of premature birth.  Sleeping ok without nocturnal  or early am exacerbation  of respiratory  c/o's or need for noct saba. Also denies any obvious fluctuation of symptoms with weather or environmental changes or other aggravating or alleviating factors except as  outlined above   Current Medications, Allergies, Complete Past Medical History, Past Surgical History, Family History, and Social History were reviewed in [employer] record.  ROS  The following are not active complaints unless bolded sore throat, dysphagia, dental problems, itching, sneezing,  nasal congestion or excess/ purulent secretions, ear ache,   fever, chills, sweats, unintended wt loss, pleuritic or exertional cp, hemoptysis,  orthopnea pnd or leg swelling, presyncope, palpitations, heartburn, abdominal pain, anorexia, nausea, vomiting, diarrhea  or change in bowel or urinary habits, change in stools or urine, dysuria,hematuria,  rash, arthralgias, visual complaints, headache, numbness weakness or ataxia or problems with walking or coordination,  change in mood/affect or memory.             Objective:   Physical Exam   Wt Readings from Last 3 Encounters:  01/19/14 169 lb (76.658 kg)  12/25/13 167 lb (75.751 kg)  08/25/13 167 lb 6.4 oz (75.932 kg)      amb wm easily confused with details of care  HEENT mild turbinate edema.  Oropharynx no thrush or excess pnd or cobblestoning.  No JVD or cervical adenopathy. Mild accessory muscle hypertrophy. Trachea midline, nl thryroid. Chest was hyperinflated by percussion with diminished breath sounds and moderate increased exp time without wheeze. Hoover sign positive at mid inspiration. Regular rate  and rhythm without murmur gallop or rub or increase P2 or edema.  Abd: no hsm, nl excursion. Ext warm without cyanosis or clubbing.       cxr 01/19/14 Cardiomegaly. Calcified tortuous aorta. Clear lung fields.  Hyperaeration suggesting COPD. No focal infiltrates. No worrisome  osseous lesions      Assessment & Plan:

## 2014-01-19 NOTE — Patient Instructions (Addendum)
Continue singulair daily until Dr Joya Gaskins sees you  Plan B for breathing  Only use your albuterol (proair) as a rescue medication to be used if you can't catch your breath by resting or doing a relaxed purse lip breathing pattern.  - The less you use it, the better it will work when you need it. - Ok to use up to 2 puffs  every 4 hours if you must but call for immediate appointment if use goes up over your usual need - Don't leave home without it !!  (think of it like the spare tire for your car)   Plan B = tessilon  Use up to every 6 hours as needed   Please remember to go to thex-ray department downstairs for your tests - we will call you with the results when they are available.

## 2014-01-20 NOTE — Assessment & Plan Note (Signed)
For now rx with prn tessilon

## 2014-01-20 NOTE — Progress Notes (Signed)
Quick Note:  Spoke with pt and notified of results per Dr. Wert. Pt verbalized understanding and denied any questions.  ______ 

## 2014-01-20 NOTE — Assessment & Plan Note (Addendum)
DDX of  difficult airways management all start with A and  include Adherence, Ace Inhibitors, Acid Reflux, Active Sinus Disease, Alpha 1 Antitripsin deficiency, Anxiety masquerading as Airways dz,  ABPA,  allergy(esp in young), Aspiration (esp in elderly), Adverse effects of DPI,  Active smokers, plus two Bs  = Bronchiectasis and Beta blocker use..and one C= CHF  Adherence is always the initial "prime suspect" and is a multilayered concern that requires a "trust but verify" approach in every patient - starting with knowing how to use medications, especially inhalers, correctly, keeping up with refills and understanding the fundamental difference between maintenance and prns vs those medications only taken for a very short course and then stopped and not refilled.  - very confused with maint vs prns, reviewed - The proper method of use, as well as anticipated side effects, of a metered-dose inhaler are discussed and demonstrated to the patient. Improved effectiveness after extensive coaching during this visit to a level of approximately  75%   ? Asp suggested by flare related to meals > neg cxr  ? Allergy suggested by "response" to singulair > for now should take as maint, no prn   ? Acid (or non-acid) GERD > always difficult to exclude as up to 75% of pts in some series report no assoc GI/ Heartburn symptoms> rec consider trial of  max (24h)  acid suppression and diet restrictions if cough recurs related to meals or Barium swallow  See instructions for specific recommendations which were reviewed directly with the patient who was given a copy with highlighter outlining the key components.

## 2014-03-12 ENCOUNTER — Other Ambulatory Visit: Payer: Self-pay | Admitting: Dermatology

## 2014-04-30 ENCOUNTER — Ambulatory Visit (INDEPENDENT_AMBULATORY_CARE_PROVIDER_SITE_OTHER): Payer: Medicare Other | Admitting: Critical Care Medicine

## 2014-04-30 ENCOUNTER — Encounter: Payer: Self-pay | Admitting: Critical Care Medicine

## 2014-04-30 VITALS — BP 156/87 | HR 73 | Temp 98.1°F | Ht 69.0 in | Wt 163.8 lb

## 2014-04-30 DIAGNOSIS — J432 Centrilobular emphysema: Secondary | ICD-10-CM

## 2014-04-30 DIAGNOSIS — Z23 Encounter for immunization: Secondary | ICD-10-CM

## 2014-04-30 MED ORDER — BENZONATATE 200 MG PO CAPS: ORAL_CAPSULE | ORAL | Status: DC

## 2014-04-30 NOTE — Patient Instructions (Signed)
Refills on tessalon given A Flu vaccine was given Return 4 months

## 2014-04-30 NOTE — Progress Notes (Deleted)
Subjective:    Patient ID: Micheal Frank, male    DOB: 07-Dec-1925, 80 y.o.   MRN: [id number]  HPI  Subjective:    Patient ID: Micheal Frank, male    DOB: [birth year], 80 y.o.   MRN: [id number]  HPI  Pulmonary OV  This is an 80 y.o. male with Golds III COPD primary emphysema DLCO 69% FeV1 94% 10/09  This pt has been with [facility] MD in the past. Dx COPD for 20 yrs. Last pfts 11/08 at [facility]:  DLCO 58%. also OSA on CPAP  Prolonged and multiple hospitalizations in 2012.  Infected L TKR 5/12 with Staph aureus. Removed Knee in 4/16,  Complications of epidural abscess, SDH spontaneous from coumadin (6/12), Infected L TKR explanted and finally new TKR 11/12.   DId not have to drain the hematoma in the brain.  Did drain the abscess in the back.     12/25/2013 Chief Complaint  Patient presents with  . Follow-up    Pt c/o SOB with exertion, especially with exercize or yardwork.  States he has difficulty with bringing up mucous when coughing.  CAT score 13  Some dyspnea with dressing and yardwork  No chest pain No real wheeze.  Cough is under control. rec No change in medications Return 4 months Prevnar 13 was given  01/19/14 acute cough onset 01/17/14 30 min p supper >  Started singulair one prior to OV  And much better p an hour. Very confused with maint vs prns ? Taking tessilon as maint.  No excess mucus, no sob> baseline doe  No obvious day to day or daytime variabilty or assoc   cp or chest tightness, subjective wheeze overt sinus or hb symptoms. No unusual exp hx or h/o childhood pna/ asthma or knowledge of premature birth.  Sleeping ok without nocturnal  or early am exacerbation  of respiratory  c/o's or need for noct saba. Also denies any obvious fluctuation of symptoms with weather or environmental changes or other aggravating or alleviating factors except as outlined above   04/30/2014 Chief Complaint  Patient presents with  . COPD    follow-up. Pt states he still has  SOB with activities such as getting dressed in the mornings. He states he does not feel SOB is any worse then before.   No new issues. No mucus. Dyspnea is the same.  Unchanged Pt denies any significant sore throat, nasal congestion or excess secretions, fever, chills, sweats, unintended weight loss, pleurtic or exertional chest pain, orthopnea PND, or leg swelling Pt denies any increase in rescue therapy over baseline, denies waking up needing it or having any early am or nocturnal exacerbations of coughing/wheezing/or dyspnea. Pt also denies any obvious fluctuation in symptoms with  weather or environmental change or other alleviating or aggravating factors    Constitutional:   No  weight loss, night sweats,  Fevers, chills, fatigue, lassitude. HEENT:   No headaches,  Difficulty swallowing,  Tooth/dental problems,  Sore throat,                No sneezing, itching, ear ache, nasal congestion, post nasal drip,   CV:  No chest pain,  Orthopnea, PND, swelling in lower extremities, anasarca, dizziness, palpitations  GI  No heartburn, indigestion, abdominal pain, nausea, vomiting, diarrhea, change in bowel habits, loss of appetite  Resp: No shortness of breath with exertion or at rest.  No excess mucus, no productive cough,  No non-productive cough,  No coughing up  of blood.  No change in color of mucus.  No wheezing.  No chest wall deformity  Skin: no rash or lesions.  GU: no dysuria, change in color of urine, no urgency or frequency.  No flank pain.  MS:  No joint pain or swelling.  No decreased range of motion.  No back pain.  Psych:  No change in mood or affect. No depression or anxiety.  No memory loss.  Objective:   Filed Vitals:   04/30/14 0952  BP: 156/87  Pulse: 73  Temp: 98.1 F (36.7 C)  TempSrc: Oral  Height: 5\' 9"  (1.753 m)  Weight: 163 lb 12.8 oz (74.299 kg)  SpO2: 93%    Gen: Pleasant, well-nourished, in no distress,  normal affect  ENT: No lesions,  mouth clear,   oropharynx clear, no postnasal drip  Neck: No JVD, no TMG, no carotid bruits  Lungs: No use of accessory muscles, no dullness to percussion, clear without rales or rhonchi  Cardiovascular: RRR, heart sounds normal, no murmur or gallops, no peripheral edema  Abdomen: soft and NT, no HSM,  BS normal  Musculoskeletal: No deformities, no cyanosis or clubbing  Neuro: alert, non focal  Skin: Warm, no lesions or rashes  No results found.       Assessment & Plan:            Review of Systems     Objective:   Physical Exam        Assessment & Plan:

## 2014-05-01 NOTE — Progress Notes (Signed)
Subjective:    Patient ID: Micheal Frank, male    DOB: 05/02/1926, 80 y.o.   MRN: [id number]  HPI 04/30/2014 Chief Complaint  Patient presents with  . COPD    follow-up. Pt states he still has SOB with activities such as getting dressed in the mornings. He states he does not feel SOB is any worse then before.   No new issues. No mucus. Dyspnea is the same.  Unchanged Pt denies any significant sore throat, nasal congestion or excess secretions, fever, chills, sweats, unintended weight loss, pleurtic or exertional chest pain, orthopnea PND, or leg swelling Pt denies any increase in rescue therapy over baseline, denies waking up needing it or having any early am or nocturnal exacerbations of coughing/wheezing/or dyspnea. Pt also denies any obvious fluctuation in symptoms with  weather or environmental change or other alleviating or aggravating factors     Review of Systems Constitutional:   No  weight loss, night sweats,  Fevers, chills, fatigue, lassitude. HEENT:   No headaches,  Difficulty swallowing,  Tooth/dental problems,  Sore throat,                No sneezing, itching, ear ache, nasal congestion, post nasal drip,   CV:  No chest pain,  Orthopnea, PND, swelling in lower extremities, anasarca, dizziness, palpitations  GI  No heartburn, indigestion, abdominal pain, nausea, vomiting, diarrhea, change in bowel habits, loss of appetite  Resp: Notes  shortness of breath with exertion not at rest.  No excess mucus, no productive cough,  No non-productive cough,  No coughing up of blood.  No change in color of mucus.  No wheezing.  No chest wall deformity  Skin: no rash or lesions.  GU: no dysuria, change in color of urine, no urgency or frequency.  No flank pain.  MS:  No joint pain or swelling.  No decreased range of motion.  No back pain.  Psych:  No change in mood or affect. No depression or anxiety.  No memory loss.     Objective:   Physical Exam Filed Vitals:   04/30/14  0952  BP: 156/87  Pulse: 73  Temp: 98.1 F (36.7 C)  TempSrc: Oral  Height: 5\' 9"  (1.753 m)  Weight: 163 lb 12.8 oz (74.299 kg)  SpO2: 93%    Gen: Pleasant, well-nourished, in no distress,  normal affect  ENT: No lesions,  mouth clear,  oropharynx clear, no postnasal drip  Neck: No JVD, no TMG, no carotid bruits  Lungs: No use of accessory muscles, no dullness to percussion, distant breath sounds  Cardiovascular: RRR, heart sounds normal, no murmur or gallops, no peripheral edema  Abdomen: soft and NT, no HSM,  BS normal  Musculoskeletal: No deformities, no cyanosis or clubbing  Neuro: alert, non focal  Skin: Warm, no lesions or rashes  No results found.        Assessment & Plan:   COPD with emphysema COPD with primary emphysematous component Plan Maintain Symbicort Flu vaccine was given   Updated Medication List Outpatient Encounter Prescriptions as of 04/30/2014  Medication Sig  . albuterol (PROAIR HFA) 108 (90 BASE) MCG/ACT inhaler Inhale 2 puffs into the lungs every 6 (six) hours as needed for wheezing.  . benzonatate (TESSALON) 200 MG capsule TAKE ONE CAPSULE 3-4 TIMES DAILY AS NEEDED FOR COUGH  . budesonide-formoterol (SYMBICORT) 160-4.5 MCG/ACT inhaler Inhale 2 puffs into the lungs 2 (two) times daily.  Marland Kitchen buPROPion (WELLBUTRIN SR) 200 MG 12 hr tablet Take  200 mg by mouth 2 (two) times daily.  . calcium carbonate (OS-CAL) 600 MG TABS Take 600 mg by mouth daily.   Marland Kitchen doxazosin (CARDURA) 4 MG tablet Take 4 mg by mouth at bedtime.  . fluticasone (FLONASE) 50 MCG/ACT nasal spray Place 2 sprays into the nose 2 (two) times daily.  . Guaifenesin (MUCINEX MAXIMUM STRENGTH) 1200 MG TB12 Take 1,200 mg by mouth daily.  Marland Kitchen HYDROcodone-acetaminophen (NORCO/VICODIN) 5-325 MG per tablet Take 1-2 tablets by mouth every 6 (six) hours as needed.  . meclizine (ANTIVERT) 25 MG tablet Take 25 mg by mouth daily as needed (vertigo).   . montelukast (SINGULAIR) 10 MG tablet Take  10 mg by mouth at bedtime as needed.  . Multiple Vitamin (MULTIVITAMIN WITH MINERALS) TABS Take 1 tablet by mouth daily.  Marland Kitchen oxyCODONE (OXY IR/ROXICODONE) 5 MG immediate release tablet Take 5 mg by mouth as needed for severe pain.  Marland Kitchen rOPINIRole (REQUIP) 1 MG tablet Take 1 mg by mouth at bedtime.   . simvastatin (ZOCOR) 40 MG tablet Take 40 mg by mouth at bedtime.   . triamterene-hydrochlorothiazide (MAXZIDE-25) 37.5-25 MG per tablet Take 0.5 tablets by mouth daily.  . [DISCONTINUED] benzonatate (TESSALON) 200 MG capsule TAKE ONE CAPSULE 3-4 TIMES DAILY AS NEEDED FOR COUGH

## 2014-05-01 NOTE — Assessment & Plan Note (Signed)
COPD with primary emphysematous component Plan Maintain Symbicort Flu vaccine was given

## 2014-05-14 ENCOUNTER — Telehealth: Payer: Self-pay | Admitting: Critical Care Medicine

## 2014-05-14 NOTE — Telephone Encounter (Signed)
Called and spoke to pt's EC, [name]. Informed pt of recs per PW. [name] verbalized understanding and denied any further questions or concerns at this time.

## 2014-05-14 NOTE — Telephone Encounter (Signed)
Called spoke with [name]. 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 They are wanting to know who Dr. Joya Gaskins recs for new doc for pt in the [facility] practice. Please advise thanks

## 2014-05-14 NOTE — Telephone Encounter (Signed)
Suggest dr Larose Kells or tabori down stairs at [facility]

## 2014-06-01 ENCOUNTER — Telehealth: Payer: Self-pay | Admitting: Critical Care Medicine

## 2014-06-01 MED ORDER — LEVOFLOXACIN 500 MG PO TABS: 500.0000 mg | ORAL_TABLET | Freq: Every day | ORAL | Status: DC

## 2014-06-01 MED ORDER — PREDNISONE 10 MG PO TABS
ORAL_TABLET | ORAL | Status: DC
Start: 2014-06-01 — End: 2014-07-14

## 2014-06-01 MED ORDER — BENZONATATE 200 MG PO CAPS: ORAL_CAPSULE | ORAL | Status: DC

## 2014-06-01 NOTE — Telephone Encounter (Signed)
Call in  Prednisone 10mg  Take 4 for three days 3 for three days 2 for three days 1 for three days and stop Levaquin 500mg  /d x 5 days

## 2014-06-01 NOTE — Telephone Encounter (Signed)
Spoke with (EC) Lynn-daughter Pt is coughing up thick phlegm and is choking on it at times. Mucus is clear. Pt reports that at times he will cough so hard that he feels like he will vomit Pt c/o chest congestion and SOB Denies CP/tightness  Allergies  Allergen Reactions  . Anticoagulant Compound     History of bleeding on the brain per daughter   Please advise Dr Joya Gaskins. Thanks.

## 2014-06-01 NOTE — Telephone Encounter (Signed)
Called and spoke to pt's [facility], Mrs. Micheal Frank. Informed Mrs. Micheal Frank of the recs per PW. Rx sent to preferred pharmacy. Mrs. Micheal Frank verbalized understanding and also requested refill of Benzonatate. Spoke with PW and he gave verbal order for refill of benzonatate. Refill sent to preferred pharmacy. Nothing further needed.

## 2014-06-03 ENCOUNTER — Telehealth: Payer: Self-pay | Admitting: Critical Care Medicine

## 2014-06-03 NOTE — Telephone Encounter (Signed)
Noted  

## 2014-06-03 NOTE — Telephone Encounter (Signed)
Daughter called back and said she spoke with her father.  He does not want to come into the office.  Asking to disregard the message.

## 2014-06-03 NOTE — Telephone Encounter (Signed)
Spoke with pt daughter [name], states that the patient reports feeling better, does not want to come in and be seen this week. Daughter states that she was concerned with his breathing yesterday and wanted him to be seen. Pt has started abx and pred 06/01/14 around Noon. States that the patient has no complaints of chest congestion. Still coughing at night, will call if not improved by Friday. Will send to Dr Joya Gaskins as Juluis Rainier per patient daughters request.

## 2014-06-04 ENCOUNTER — Telehealth: Payer: Self-pay | Admitting: Critical Care Medicine

## 2014-06-04 NOTE — Telephone Encounter (Signed)
Refill of the tessalon was sent in to the pharmacy on 11/16.  i have called and spoke with lynn and she is aware and she will call and speak with [facility]. Nothing further is needed.

## 2014-06-16 ENCOUNTER — Other Ambulatory Visit: Payer: Self-pay

## 2014-07-06 ENCOUNTER — Telehealth: Payer: Self-pay | Admitting: Critical Care Medicine

## 2014-07-06 MED ORDER — PREDNISONE 10 MG PO TABS
ORAL_TABLET | ORAL | Status: DC
Start: 2014-07-06 — End: 2014-07-14

## 2014-07-06 MED ORDER — AMOXICILLIN-POT CLAVULANATE 875-125 MG PO TABS: 1.0000 | ORAL_TABLET | Freq: Two times a day (BID) | ORAL | Status: DC

## 2014-07-06 NOTE — Telephone Encounter (Signed)
Spoke with pt's daughter, states pt has been c/o prod cough with yellow mucus since yesterday.  Denies sinus congestion, increased sob, fever.  Is requesting an abx and [facility].    Dr. Joya Gaskins please advise.  Thanks!

## 2014-07-06 NOTE — Telephone Encounter (Signed)
Rx : augmentin 875mg  bid x 7days Prednisone Take 4 tablets daily for 5 days then stop #20

## 2014-07-06 NOTE — Telephone Encounter (Signed)
Spoke with pt daughter-aware that Rx's have been sent to [facility]. Nothing further needed.

## 2014-07-14 ENCOUNTER — Telehealth: Payer: Self-pay | Admitting: Critical Care Medicine

## 2014-07-14 ENCOUNTER — Ambulatory Visit (INDEPENDENT_AMBULATORY_CARE_PROVIDER_SITE_OTHER)
Admission: RE | Admit: 2014-07-14 | Discharge: 2014-07-14 | Disposition: A | Discharge status: Home / Self Care | Payer: Medicare Other | Source: Ambulatory Visit | Attending: Internal Medicine | Admitting: Internal Medicine

## 2014-07-14 ENCOUNTER — Ambulatory Visit (INDEPENDENT_AMBULATORY_CARE_PROVIDER_SITE_OTHER): Payer: Medicare Other | Admitting: Internal Medicine

## 2014-07-14 ENCOUNTER — Encounter: Payer: Self-pay | Admitting: Internal Medicine

## 2014-07-14 VITALS — BP 124/66 | HR 75 | Ht 69.0 in | Wt 164.8 lb

## 2014-07-14 DIAGNOSIS — J209 Acute bronchitis, unspecified: Secondary | ICD-10-CM

## 2014-07-14 MED ORDER — PREDNISONE 10 MG PO TABS: ORAL_TABLET | ORAL | Status: DC

## 2014-07-14 MED ORDER — AZITHROMYCIN 250 MG PO TABS: ORAL_TABLET | ORAL | Status: DC

## 2014-07-14 NOTE — Progress Notes (Signed)
Subjective:    Patient ID: Micheal Frank, male    DOB: [birth year], 80 y.o.   MRN: [id number]  HPI 04/30/2014 Chief Complaint  Patient presents with  . COPD    follow-up. Pt states he still has SOB with activities such as getting dressed in the mornings. He states he does not feel SOB is any worse then before.   No new issues. No mucus. Dyspnea is the same.  Unchanged Pt denies any significant sore throat, nasal congestion or excess secretions, fever, chills, sweats, unintended weight loss, pleurtic or exertional chest pain, orthopnea PND, or leg swelling Pt denies any increase in rescue therapy over baseline, denies waking up needing it or having any early am or nocturnal exacerbations of coughing/wheezing/or dyspnea. Pt also denies any obvious fluctuation in symptoms with  weather or environmental change or other alleviating or aggravating factors    07/14/14- Acute OV Dr Annamaria Boots   Dr Joya Gaskins follows for COPD/E, hx asbestos ACUTE VISIT: increased phlegm; could not lie down due to so much phlegm. Has a slight yellow color at times. Nasal congestion.  Dtr here For the past week and a half is felt sick with increased cough and phlegm. Treated with Augmentin and 4 days of prednisone which helped some but did not quite getting well. Increased cough now lying down. Denies fever, sweats, purulent sputum, edema. Tessalon did not help. CXR 01/19/14 IMPRESSION: No active cardiopulmonary disease. Stable exam. Electronically Signed  By: Rolla Flatten M.D.  On: 01/19/2014 15:17  Review of Systems Constitutional:   No  weight loss, night sweats,  Fevers, chills, fatigue, lassitude. HEENT:   No headaches,  Difficulty swallowing,  Tooth/dental problems,  Sore throat,                No sneezing, itching, ear ache, nasal congestion, post nasal drip,   CV:  No chest pain,  Orthopnea, PND, swelling in lower extremities, anasarca, dizziness, palpitations  GI  No heartburn, indigestion, abdominal  pain, nausea, vomiting, diarrhea, change in bowel habits, loss of appetite  Resp: Notes  shortness of breath with exertion not at rest.  No excess mucus,  +productive cough,  + non-productive cough,  No coughing up of blood.  No change in color of mucus.  No wheezing.    Skin: no rash or lesions.  GU: .  MS:  No joint pain or swelling.  No decreased range of motion.  No back pain.  Psych:  No change in mood or affect. No depression or anxiety.  No memory loss.     Objective:   Physical Exam Filed Vitals:   07/14/14 1556  BP: 124/66  Pulse: 75  Height: 5\' 9"  (1.753 m)  Weight: 164 lb 12.8 oz (74.753 kg)  SpO2: 94%    Gen: Pleasant, well-nourished, in no distress,  normal affect  ENT: No lesions,  mouth clear,  oropharynx clear, no postnasal drip, + hearing aid  Neck: No JVD, no TMG, no carotid bruits  Lungs: No use of accessory muscles, no dullness to percussion, distant breath sounds, + few crackles, no rales or wheeze. Not coughing.  Cardiovascular: RRR, heart sounds normal, no murmur or gallops, no peripheral edema  Abdomen: soft and NT, no HSM,  BS normal  Musculoskeletal: No deformities, no cyanosis or clubbing  Neuro: alert, non focal  Skin: Warm, no lesions or rashes  No results found.     Assessment & Plan:   No problem-specific assessment & plan notes found for this  encounter.   Updated Medication List Outpatient Encounter Prescriptions as of 07/14/2014  Medication Sig  . albuterol (PROAIR HFA) 108 (90 BASE) MCG/ACT inhaler Inhale 2 puffs into the lungs every 6 (six) hours as needed for wheezing.  . benzonatate (TESSALON) 200 MG capsule TAKE ONE CAPSULE 3-4 TIMES DAILY AS NEEDED FOR COUGH  . budesonide-formoterol (SYMBICORT) 160-4.5 MCG/ACT inhaler Inhale 2 puffs into the lungs 2 (two) times daily.  Marland Kitchen buPROPion (WELLBUTRIN SR) 200 MG 12 hr tablet Take 200 mg by mouth 2 (two) times daily.  . calcium carbonate (OS-CAL) 600 MG TABS Take 600 mg by mouth  daily.   Marland Kitchen doxazosin (CARDURA) 4 MG tablet Take 4 mg by mouth at bedtime.  . fluticasone (FLONASE) 50 MCG/ACT nasal spray Place 2 sprays into the nose 2 (two) times daily.  . Guaifenesin (MUCINEX MAXIMUM STRENGTH) 1200 MG TB12 Take 1,200 mg by mouth daily.  Marland Kitchen HYDROcodone-acetaminophen (NORCO/VICODIN) 5-325 MG per tablet Take 1-2 tablets by mouth every 6 (six) hours as needed.  . meclizine (ANTIVERT) 25 MG tablet Take 25 mg by mouth daily as needed (vertigo).   . montelukast (SINGULAIR) 10 MG tablet Take 10 mg by mouth at bedtime as needed.  . Multiple Vitamin (MULTIVITAMIN WITH MINERALS) TABS Take 1 tablet by mouth daily.  Marland Kitchen oxyCODONE (OXY IR/ROXICODONE) 5 MG immediate release tablet Take 5 mg by mouth as needed for severe pain.  Marland Kitchen rOPINIRole (REQUIP) 1 MG tablet Take 1 mg by mouth at bedtime.   . simvastatin (ZOCOR) 40 MG tablet Take 40 mg by mouth at bedtime.   . triamterene-hydrochlorothiazide (MAXZIDE-25) 37.5-25 MG per tablet Take 0.5 tablets by mouth daily.  . [DISCONTINUED] amoxicillin-clavulanate (AUGMENTIN) 875-125 MG per tablet Take 1 tablet by mouth 2 (two) times daily.  . [DISCONTINUED] levofloxacin (LEVAQUIN) 500 MG tablet Take 1 tablet (500 mg total) by mouth daily.  . [DISCONTINUED] predniSONE (DELTASONE) 10 MG tablet Take 40mg  for 3 days, then 30mg  for 3 days, 20mg  for 3 days, 10mg  for 3 days, then stop  . [DISCONTINUED] predniSONE (DELTASONE) 10 MG tablet Take 4 tabs po x 5 days then stop.

## 2014-07-14 NOTE — Telephone Encounter (Signed)
Called spoke with patient's daughter [name] who reported that she discussed with pt about seeing another provider and he was okay with this.  Advised [name] that CY is an excellent physician and will take great care with her father.  She did not elaborate on any symptoms, instead stating that everything is taken care of for now and will see [place] this afternoon.  Nothing further needed; will sign off.

## 2014-07-14 NOTE — Patient Instructions (Addendum)
Scripts sent for Zpak antibiotic and for prednisone taper  Order- CXR  Dx acute bronchitis  Please call as needed  Plan on follow-up with Dr Joya Gaskins in about 6 weeks or as able, unless you need help sooner.

## 2014-07-17 NOTE — Assessment & Plan Note (Signed)
Acute exacerbation consistent with an acute bronchitis. Plan-Z Pak, prednisone taper, medication discussion

## 2014-07-28 ENCOUNTER — Telehealth: Payer: Self-pay | Admitting: Critical Care Medicine

## 2014-07-28 NOTE — Telephone Encounter (Signed)
Per SN--  Pt will need to be seen  Either ER or here tomorrow--thanks

## 2014-07-28 NOTE — Telephone Encounter (Signed)
Spoke with pt daughter(Lynn) - States that pt is still taking pred and abx Pt is now bruising along his sides - black and blue bruising. Cough is excessive. Some pain in left side. Right side is not painful. Pt states that arm movement is painful on left side. Bruising has been present x 1 week+ No pain with inspiration or when laying down Daughter is concerned with possible broken ribs. Last cxr 07/14/14 -- does pt need OV? Repeat CXR?   Please advise Dr Lenna Gilford of any rec's as Dr Joya Gaskins is not available. Thanks.

## 2014-07-28 NOTE — Telephone Encounter (Signed)
Called and spoke with pt's daughter. Informed her of the recs per SN. Micheal Frank verbalized understanding and appt made with [facility] on 07/29/2014. Micheal Frank aware of appt time and date. Nothing further needed.

## 2014-07-29 ENCOUNTER — Encounter: Payer: Self-pay | Admitting: Pulmonary Disease

## 2014-07-29 ENCOUNTER — Ambulatory Visit (HOSPITAL_COMMUNITY)
Admission: RE | Admit: 2014-07-29 | Discharge: 2014-07-29 | Disposition: A | Discharge status: Home / Self Care | Payer: Medicare Other | Source: Ambulatory Visit | Attending: Pulmonary Disease | Admitting: Pulmonary Disease

## 2014-07-29 ENCOUNTER — Ambulatory Visit (INDEPENDENT_AMBULATORY_CARE_PROVIDER_SITE_OTHER): Payer: Medicare Other | Admitting: Pulmonary Disease

## 2014-07-29 ENCOUNTER — Other Ambulatory Visit: Payer: Medicare Other

## 2014-07-29 VITALS — BP 142/78 | HR 76 | Temp 97.8°F | Ht 69.0 in | Wt 163.6 lb

## 2014-07-29 DIAGNOSIS — M8448XA Pathological fracture, other site, initial encounter for fracture: Secondary | ICD-10-CM | POA: Diagnosis not present

## 2014-07-29 DIAGNOSIS — S20219A Contusion of unspecified front wall of thorax, initial encounter: Secondary | ICD-10-CM

## 2014-07-29 DIAGNOSIS — J438 Other emphysema: Secondary | ICD-10-CM

## 2014-07-29 DIAGNOSIS — R05 Cough: Secondary | ICD-10-CM | POA: Insufficient documentation

## 2014-07-29 DIAGNOSIS — J449 Chronic obstructive pulmonary disease, unspecified: Secondary | ICD-10-CM | POA: Diagnosis not present

## 2014-07-29 DIAGNOSIS — R059 Cough, unspecified: Secondary | ICD-10-CM

## 2014-07-29 MED ORDER — HYDROCODONE-HOMATROPINE 5-1.5 MG/5ML PO SYRP: 5.0000 mL | ORAL_SOLUTION | Freq: Four times a day (QID) | ORAL | Status: DC | PRN

## 2014-07-29 NOTE — Assessment & Plan Note (Signed)
The patient is much improved after his recent COPD exacerbation after being treated with an antibiotic and prednisone. He does not feel overly short of breath at this time.

## 2014-07-29 NOTE — Assessment & Plan Note (Signed)
The patient has had severe and violent coughing since his episode of bronchitis, and now has noticed bruising superficially on his chest wall. Although his cough is better, it has not resolved and is exacerbating his chest discomfort.  I will check a chest x-ray to make sure there is not another acute process, and will also work on cough suppression.

## 2014-07-29 NOTE — Assessment & Plan Note (Signed)
The patient has significant bruising on his chest wall bilaterally, and I am assuming that it is related to his severe cough and possibly rib fractures. However, I think we need to check his platelet count and coags. We'll also check rib detail films.

## 2014-07-29 NOTE — Progress Notes (Signed)
   Subjective:    Patient ID: Micheal Frank, male    DOB: [birth year], 81 y.o.   MRN: [id number]  HPI The patient comes in today for an acute sick visit. He has normally followed by Dr. Joya Gaskins for COPD, and recently saw Dr. Annamaria Boots for an episode of acute bronchitis for which he was given an antibiotic and prednisone. He has definitely seen improvement in his breathing and improvement in his cough, but he has had severe coughing paroxysms that have not totally resolved. He now has significant chest wall pain, especially with cough, and has noticed severe bruising along the chest wall left greater than right. The patient has had no fall or other trauma, and has no history of coagulopathy or other medications which can affect clotting.   Review of Systems  Constitutional: Negative for fever and unexpected weight change.  HENT: Negative for congestion, dental problem, ear pain, nosebleeds, postnasal drip, rhinorrhea, sinus pressure, sneezing, sore throat and trouble swallowing.   Eyes: Negative for redness and itching.  Respiratory: Positive for cough and shortness of breath. Negative for chest tightness and wheezing.   Cardiovascular: Negative for palpitations and leg swelling.  Gastrointestinal: Negative for nausea and vomiting.  Genitourinary: Negative for dysuria.  Musculoskeletal: Negative for joint swelling.  Skin: Negative for rash.  Neurological: Negative for headaches.  Hematological: Does not bruise/bleed easily.  Psychiatric/Behavioral: Negative for dysphoric mood. The patient is not nervous/anxious.        Objective:   Physical Exam Well-developed male in no acute distress Nose without purulence or discharge noted Left ear with normal canal and normal TM. Neck without lymphadenopathy or thyromegaly Chest with mildly decreased breath sounds, but no wheezes or crackles. Very prominent bruising on chest wall left greater than right. Cardiac exam with regular rate and  rhythm Lower extremities with minimal edema, no cyanosis Alert and oriented, moves all 4 extremities.       Assessment & Plan:

## 2014-07-29 NOTE — Patient Instructions (Signed)
Will prescribe a cough syrup to help suppress the cough.  Take hycodan one teaspoon every 6 hrs if needed for cough. Will check bloodwork to make sure you are clotting properly Will check xray of your ribs to see if fracture or anything else going on.  Let us know if your cough is not improving.

## 2014-07-30 LAB — CBC, NO DIFFERENTIAL/PLATELET
HEMATOCRIT: 41.4 % (ref 37.5–51.0)
HEMOGLOBIN: 14 g/dL (ref 12.6–17.7)
MCH: 32.1 pg (ref 26.6–33.0)
MCHC: 33.8 g/dL (ref 31.5–35.7)
MCV: 95 fL (ref 79–97)
RBC: 4.36 x10E6/uL (ref 4.14–5.80)
RDW: 14.3 % (ref 12.3–15.4)
WBC: 9.6 10*3/uL (ref 3.4–10.8)

## 2014-07-30 LAB — PT AND PTT
INR: 1 (ref 0.8–1.2)
PROTHROMBIN TIME: 10.7 s (ref 9.1–12.0)
aPTT: 27 s (ref 24–33)

## 2014-07-31 ENCOUNTER — Telehealth: Payer: Self-pay | Admitting: Critical Care Medicine

## 2014-07-31 MED ORDER — ALBUTEROL SULFATE HFA 108 (90 BASE) MCG/ACT IN AERS: 2.0000 | INHALATION_SPRAY | Freq: Four times a day (QID) | RESPIRATORY_TRACT | Status: AC | PRN

## 2014-07-31 NOTE — Telephone Encounter (Signed)
lmomtcb x1 

## 2014-07-31 NOTE — Telephone Encounter (Signed)
I spoke with the pt's daughter  She is requesting labs  [facility], please advise thanks

## 2014-08-03 NOTE — Telephone Encounter (Signed)
Let her know bloodwork was normal

## 2014-08-03 NOTE — Telephone Encounter (Signed)
Results have been explained to patient daugher, expressed understanding. Albuterol has been sent to pharmacy 07/31/14. Nothing further needed.

## 2014-08-11 ENCOUNTER — Encounter: Payer: Self-pay | Admitting: Critical Care Medicine

## 2014-08-17 ENCOUNTER — Encounter: Payer: Self-pay | Admitting: Internal Medicine

## 2014-08-17 ENCOUNTER — Ambulatory Visit (INDEPENDENT_AMBULATORY_CARE_PROVIDER_SITE_OTHER): Payer: Medicare Other | Admitting: Internal Medicine

## 2014-08-17 VITALS — BP 158/64 | HR 60 | Temp 98.4°F | Ht 69.0 in | Wt 159.2 lb

## 2014-08-17 DIAGNOSIS — M150 Primary generalized (osteo)arthritis: Secondary | ICD-10-CM

## 2014-08-17 DIAGNOSIS — H8109 Meniere's disease, unspecified ear: Secondary | ICD-10-CM

## 2014-08-17 DIAGNOSIS — E785 Hyperlipidemia, unspecified: Secondary | ICD-10-CM

## 2014-08-17 DIAGNOSIS — N183 Chronic kidney disease, stage 3 unspecified: Secondary | ICD-10-CM

## 2014-08-17 DIAGNOSIS — M159 Polyosteoarthritis, unspecified: Secondary | ICD-10-CM

## 2014-08-17 DIAGNOSIS — J438 Other emphysema: Secondary | ICD-10-CM

## 2014-08-17 DIAGNOSIS — G4730 Sleep apnea, unspecified: Secondary | ICD-10-CM

## 2014-08-17 DIAGNOSIS — M8949 Other hypertrophic osteoarthropathy, multiple sites: Secondary | ICD-10-CM

## 2014-08-17 LAB — COMPREHENSIVE METABOLIC PANEL
ALT: 9 U/L (ref 0–53)
AST: 24 U/L (ref 0–37)
Albumin: 3.8 g/dL (ref 3.5–5.2)
Alkaline Phosphatase: 65 U/L (ref 39–117)
BUN: 18 mg/dL (ref 6–23)
CALCIUM: 9 mg/dL (ref 8.4–10.5)
CO2: 30 mEq/L (ref 19–32)
CREATININE: 1.52 mg/dL — AB (ref 0.40–1.50)
Chloride: 104 mEq/L (ref 96–112)
GFR: 46.33 mL/min — AB (ref >60.00)
GLUCOSE: 71 mg/dL (ref 70–99)
Potassium: 3.7 mEq/L (ref 3.5–5.1)
Sodium: 140 mEq/L (ref 135–145)
TOTAL PROTEIN: 6.2 g/dL (ref 6.0–8.3)
Total Bilirubin: 0.4 mg/dL (ref 0.2–1.2)

## 2014-08-17 LAB — TSH: TSH: 3.93 u[IU]/mL (ref 0.35–4.50)

## 2014-08-17 NOTE — Progress Notes (Signed)
Pre visit review using our clinic review tool, if applicable. No additional management support is needed unless otherwise documented below in the visit note. 

## 2014-08-17 NOTE — Assessment & Plan Note (Signed)
On statins, checking LFTs

## 2014-08-17 NOTE — Patient Instructions (Signed)
Get your blood work before you leave     Sign a release of information, need to see records from Dr. Maxwell Caul: Labs for the last 2 years Office visit notes for the last 2 years Colonoscopies, EKGs.  Okay to change Tessalon Perles and DayQuil to as needed only for cough   Please come back to the office 3 months  for a routine check up   Avoid taking ibuprofen, naproxen or any similar medications

## 2014-08-17 NOTE — Assessment & Plan Note (Signed)
Takes hydrocodone or oxycodone as needed

## 2014-08-17 NOTE — Assessment & Plan Note (Signed)
Not using CPAP since he lost weight

## 2014-08-17 NOTE — Assessment & Plan Note (Addendum)
Seems well-controlled at the present time, currently taking Dayquil take twice a day for a persisting cough which is now resolved. Continue with present care and change Tessalon Perles and DayQuil to as needed

## 2014-08-17 NOTE — Assessment & Plan Note (Signed)
Recommend to avoid NSAIDs, check labs

## 2014-08-17 NOTE — Progress Notes (Signed)
Subjective:    Patient ID: Micheal Frank, male    DOB: 28-Dec-1925, 81 y.o.   MRN: [id number]  DOS:  08/17/2014 Type of visit - description : new pt, transferring from Dr. Maxwell Caul, here with his daughter [name] Interval history: Needs a new PCP, in general he is stable. COPD, asthma--recently saw pulmonary with a persisting cough, chart reviewed, cough is much improved with DayQuil twice a day  History of depression, on Wellbutrin, symptoms well-controlled History of Mnire's disease, on maxzide, very seldom uses Valium. History of DJD, taking either oxycodone or hydrocodone as needed for pain.    ROS Denies any recent chest pain, difficulty breathing is at baseline. Very little sputum production and no recent hemoptysis No nausea, vomiting, diarrhea or blood in the stools.  Past Medical History  Diagnosis Date  . Subdural hematoma 03/30/11  . Epidural abscess   . Staphylococcus aureus bacteremia with sepsis   . Prosthetic joint infection   . Hyperlipidemia   . History of blood clots   . Blood dyscrasia     pt CAN NOT have any blood thinners d/t hx brain bleed  . S/P right heart catheterization     at least 15yrs  . Asthma   . COPD (chronic obstructive pulmonary disease)     emphysema  . Shortness of breath     with exertion  . Pneumonia     as a child and in 1952;had a pneumonia vaccine couple of years ago  . Peripheral neuropathy   . Short-term memory loss   . Arthritis     left leg also has swelling  . Gastric ulcer   . Constipation     miralax prn  . Depression     takes wellbutrin bid  . Urinary frequency     wears depends  . Complication of anesthesia     confusion with anesthesia  . Sleep apnea     has CPAP but doesn't use it;sleep study done at least 2yrs ago  . Melanoma   . Chronic kidney disease, stage III (moderate) 01/20/2013    Past Surgical History  Procedure Laterality Date  . Hernia repair      25+yrs ago  . 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 Total knee arthroplasty      x 2 left knee 2003/2012  . Filtering procedure      filter placed in neck d/t blood  clot  . Eye surgery      bil cataract surgery  . Skin biopsy      melanoma on head   . Knee arthroscopy    . Total knee revision  05/23/2011    Procedure: TOTAL KNEE REVISION;  Surgeon: Meredith Pel;  Location: [facility];  Service: Orthopedics;  Laterality: Left;  LEFT KNEE REMOVAL OF SPACER, POSSIBLE REIMPLANTATION OF REVISION TKA VERSES REPLACEMENT ANTIBIOTIC SPACER  . Epidural abscess drainage  12/2010    History   Social History  . Marital Status: Married    Spouse Name: N/A    Number of Children: 3  . Years of Education: N/A   Occupational History  . retired    Social History Main Topics  . Smoking status: Former Smoker -- 2.00 packs/day for 40 years    Types: Cigarettes    Quit date: 07/17/1978  . Smokeless tobacco: Never Used  . Alcohol Use: No  . Drug Use: No  . Sexual Activity:  Not on file   Other Topics Concern  . Not on file   Social History Narrative   Lost wife 06-2013   Lives in his house, 1 daughter and her 2 adult children live w/ him (1g-child w/ special needs)        Family History  Problem Relation Age of Onset  . CAD Neg Hx   . Diabetes Neg Hx   . Melanoma Brother        Medication List       This list is accurate as of: 08/17/14  7:33 PM.  Always use your most recent med list.               albuterol 108 (90 BASE) MCG/ACT inhaler  Commonly known as:  PROAIR HFA  Inhale 2 puffs into the lungs every 6 (six) hours as needed for wheezing.     benzonatate 200 MG capsule  Commonly known as:  TESSALON  TAKE ONE CAPSULE 3-4 TIMES DAILY AS NEEDED FOR COUGH     budesonide-formoterol 160-4.5 MCG/ACT inhaler  Commonly known as:  SYMBICORT  Inhale 2 puffs into the lungs 2 (two) times daily.     buPROPion 200 MG 12 hr tablet  Commonly known as:  WELLBUTRIN SR  Take 200 mg by mouth 2  (two) times daily.     calcium carbonate 600 MG Tabs tablet  Commonly known as:  OS-CAL  Take 600 mg by mouth daily.     DAYQUIL PO  Take 2 tablets by mouth daily.     diazepam 5 MG tablet  Commonly known as:  VALIUM  Take 5 mg by mouth every 6 (six) hours as needed for anxiety.     doxazosin 4 MG tablet  Commonly known as:  CARDURA  Take 4 mg by mouth at bedtime.     fluticasone 50 MCG/ACT nasal spray  Commonly known as:  FLONASE  Place 2 sprays into the nose 2 (two) times daily.     HYDROcodone-acetaminophen 5-325 MG per tablet  Commonly known as:  NORCO/VICODIN  Take 1-2 tablets by mouth every 6 (six) hours as needed.     HYDROcodone-homatropine 5-1.5 MG/5ML syrup  Commonly known as:  HYCODAN  Take 5 mLs by mouth every 6 (six) hours as needed for cough.     meclizine 25 MG tablet  Commonly known as:  ANTIVERT  Take 25 mg by mouth daily as needed (vertigo).     montelukast 10 MG tablet  Commonly known as:  SINGULAIR  Take 10 mg by mouth at bedtime as needed.     MUCINEX MAXIMUM STRENGTH 1200 MG Tb12  Generic drug:  Guaifenesin  Take 600 mg by mouth daily.     multivitamin with minerals Tabs tablet  Take 1 tablet by mouth daily.     oxyCODONE 5 MG immediate release tablet  Commonly known as:  Oxy IR/ROXICODONE  Take 5 mg by mouth as needed for severe pain.     rOPINIRole 1 MG tablet  Commonly known as:  REQUIP  Take 1 mg by mouth at bedtime.     simvastatin 40 MG tablet  Commonly known as:  ZOCOR  Take 40 mg by mouth at bedtime.     triamterene-hydrochlorothiazide 37.5-25 MG per tablet  Commonly known as:  MAXZIDE-25  Take 0.5 tablets by mouth daily.           Objective:   Physical Exam  Constitutional: He is oriented to person, place, and time. He  appears well-developed and well-nourished. No distress.  Neck: No thyromegaly present.  Cardiovascular:  Soft systolic murmur, regular rate and rhythm,  no rub or gallop  Pulmonary/Chest: Effort  normal. No respiratory distress.   decreased breath sounds bilaterally, very scattered rhonchi with cough   Abdominal: Soft. Bowel sounds are normal. He exhibits no distension and no mass. There is no tenderness. There is no rebound and no guarding.  No bruit  Musculoskeletal: Normal range of motion. He exhibits no edema or tenderness.  Neurological: He is alert and oriented to person, place, and time. He exhibits normal muscle tone.  Speech normal, gait unassisted and normal for age, motor strength appropriate for age   Skin: Skin is warm and dry. No pallor.  Psychiatric: He has a normal mood and affect. His behavior is normal. Judgment and thought content normal.   BP 158/64 mmHg  Pulse 60  Temp(Src) 98.4 F (36.9 C) (Oral)  Ht 5\' 9"  (1.753 m)  Wt 159 lb 4 oz (72.235 kg)  BMI 23.51 kg/m2  SpO2 90%       Assessment & Plan:

## 2014-08-17 NOTE — Assessment & Plan Note (Signed)
Symptoms currently well controlled with daily Maxide, uses Valium very rarely. Check labs

## 2014-08-27 ENCOUNTER — Ambulatory Visit: Payer: Medicare Other | Admitting: Critical Care Medicine

## 2014-08-31 ENCOUNTER — Telehealth: Payer: Self-pay

## 2014-08-31 NOTE — Telephone Encounter (Signed)
UDS: 08/17/2014  Hydrocodone Negative: NEW RX GIVEN 08/17/2014.   Low risk per Dr. Larose Kells 08/31/2014

## 2014-09-01 ENCOUNTER — Telehealth: Payer: Self-pay | Admitting: Critical Care Medicine

## 2014-09-01 MED ORDER — HYDROCODONE-ACETAMINOPHEN 5-325 MG PO TABS: 1.0000 | ORAL_TABLET | Freq: Four times a day (QID) | ORAL | Status: DC | PRN

## 2014-09-01 NOTE — Addendum Note (Signed)
Addended by: Kathlene November E on: 09/01/2014 01:15 PM   Modules accepted: Orders

## 2014-09-01 NOTE — Telephone Encounter (Addendum)
Prescription for hydrocodone printed, advise patient to take it sporadically and watch for drowsiness.

## 2014-09-01 NOTE — Telephone Encounter (Signed)
Would prefer PCP fill this

## 2014-09-01 NOTE — Telephone Encounter (Signed)
Micheal Frank is aware that PW would like for his PCP to fill this. Nothing further was needed.

## 2014-09-01 NOTE — Telephone Encounter (Signed)
Please advise 

## 2014-09-01 NOTE — Telephone Encounter (Signed)
[name] calling in regarding this. Please advise.

## 2014-09-01 NOTE — Telephone Encounter (Signed)
Last OV was with PW was on 04/30/14. Last refill of Hydrocodone 5-325 #30 was on 01/23/13.  PW - please advise on refill. Thanks.

## 2014-09-01 NOTE — Telephone Encounter (Signed)
Spoke with Micheal Frank, informed her that Pt Rx is ready for pick up at front desk.

## 2014-09-01 NOTE — Telephone Encounter (Signed)
Printed, awaiting Dr. Larose Kells signature.

## 2014-09-07 ENCOUNTER — Telehealth: Payer: Self-pay | Admitting: Internal Medicine

## 2014-09-07 MED ORDER — HYDROCODONE-HOMATROPINE 5-1.5 MG/5ML PO SYRP: 5.0000 mL | ORAL_SOLUTION | Freq: Four times a day (QID) | ORAL | Status: DC | PRN

## 2014-09-07 NOTE — Telephone Encounter (Signed)
THE PATIENT NEEDED COUGH MEDICINE NOT A PAIN PILL.  DAUGHTER DID NOT LOOK AT THE SCRIPT WHEN SHE PICKED IT UP

## 2014-09-07 NOTE — Telephone Encounter (Signed)
Please advise 

## 2014-09-07 NOTE — Telephone Encounter (Signed)
Spoke with [name], Pts daughter, informed her that Rx is ready for pick up at front desk. [name] verbalized understanding.

## 2014-09-07 NOTE — Telephone Encounter (Signed)
Requested  as hydrocodone and Tylenol but will change to hycodan (printed). If he is not taking any pain medication, please delete that from the medication list

## 2014-09-09 ENCOUNTER — Encounter: Payer: Self-pay | Admitting: Internal Medicine

## 2014-09-15 ENCOUNTER — Telehealth: Payer: Self-pay | Admitting: Critical Care Medicine

## 2014-09-15 MED ORDER — BENZONATATE 200 MG PO CAPS: ORAL_CAPSULE | ORAL | Status: DC

## 2014-09-15 NOTE — Telephone Encounter (Signed)
Spoke with [employer]. Advised her that PW would not be back in the office until tomorrow. She states that this is not an emergency and can wait until then.  PW - please advise on refill. Thanks.

## 2014-09-15 NOTE — Telephone Encounter (Signed)
Ok to refill benzonatate 100mg  Sig: 1-2 every 6 hours prn cough #[id number]

## 2014-09-15 NOTE — Telephone Encounter (Signed)
Rx has been sent in per PW. Micheal Frank is aware that this has been done. Nothing further was needed.

## 2014-09-17 ENCOUNTER — Ambulatory Visit: Payer: Medicare Other | Admitting: Critical Care Medicine

## 2014-09-28 ENCOUNTER — Telehealth: Payer: Self-pay | Admitting: Internal Medicine

## 2014-09-28 MED ORDER — BUPROPION HCL ER (SR) 200 MG PO TB12: 200.0000 mg | ORAL_TABLET | Freq: Two times a day (BID) | ORAL | Status: DC

## 2014-09-28 NOTE — Telephone Encounter (Signed)
Pt is requesting refill on Bupropion (Welbutrin), med currently on med list, however, do not see where you have ever refilled. Okay to refill?

## 2014-09-28 NOTE — Telephone Encounter (Signed)
The patient is new to me, is okay to refill for 6 months

## 2014-09-28 NOTE — Telephone Encounter (Signed)
Wellbutrin sent to [facility] as requested. (# 60 and 6 refills)

## 2014-09-28 NOTE — Telephone Encounter (Signed)
Caller name: lynn Relation to pt: daughter Call back number: [phone] Pharmacy: [facility] [address]  Reason for call:    Requesting bupropion refill. Patient is almost out

## 2014-10-08 ENCOUNTER — Ambulatory Visit (INDEPENDENT_AMBULATORY_CARE_PROVIDER_SITE_OTHER): Payer: Medicare Other | Admitting: Critical Care Medicine

## 2014-10-08 ENCOUNTER — Encounter: Payer: Self-pay | Admitting: Critical Care Medicine

## 2014-10-08 VITALS — BP 132/81 | HR 73 | Temp 97.8°F | Ht 69.0 in | Wt 164.0 lb

## 2014-10-08 DIAGNOSIS — J432 Centrilobular emphysema: Secondary | ICD-10-CM

## 2014-10-08 MED ORDER — HYDROCODONE-HOMATROPINE 5-1.5 MG/5ML PO SYRP: 5.0000 mL | ORAL_SOLUTION | Freq: Four times a day (QID) | ORAL | Status: DC | PRN

## 2014-10-08 NOTE — Progress Notes (Signed)
Subjective:    Patient ID: Micheal Frank, male    DOB: 16-Mar-1926, 81 y.o.   MRN: [id number]  HPI 10/08/2014 Chief Complaint  Patient presents with  . Follow-up    patient doing well.  no concerns.  pt ill 07/2014 Rx pred and zpak coughed so hard had multiple rib fx.   Now is better Pt denies any significant sore throat, nasal congestion or excess secretions, fever, chills, sweats, unintended weight loss, pleurtic or exertional chest pain, orthopnea PND, or leg swelling Pt denies any increase in rescue therapy over baseline, denies waking up needing it or having any early am or nocturnal exacerbations of coughing/wheezing/or dyspnea. Pt also denies any obvious fluctuation in symptoms with  weather or environmental change or other alleviating or aggravating factors     Review of Systems Constitutional:   No  weight loss, night sweats,  Fevers, chills, fatigue, lassitude. HEENT:   No headaches,  Difficulty swallowing,  Tooth/dental problems,  Sore throat,                No sneezing, itching, ear ache, nasal congestion, post nasal drip,   CV:  No chest pain,  Orthopnea, PND, swelling in lower extremities, anasarca, dizziness, palpitations  GI  No heartburn, indigestion, abdominal pain, nausea, vomiting, diarrhea, change in bowel habits, loss of appetite  Resp: Notes shortness of breath with exertion not at rest.  No excess mucus, no productive cough,  No non-productive cough,  No coughing up of blood.  No change in color of mucus.  No wheezing.  No chest wall deformity  Skin: no rash or lesions.  GU: no dysuria, change in color of urine, no urgency or frequency.  No flank pain.  MS:  No joint pain or swelling.  No decreased range of motion.  No back pain.  Psych:  No change in mood or affect. No depression or anxiety.  No memory loss.     Objective:   Physical Exam Filed Vitals:   10/08/14 1040  BP: 132/81  Pulse: 73  Temp: 97.8 F (36.6 C)  TempSrc: Oral  Height: 5'  9" (1.753 m)  Weight: 164 lb (74.39 kg)  SpO2: 96%    Gen: Pleasant, well-nourished, in no distress,  normal affect  ENT: No lesions,  mouth clear,  oropharynx clear, no postnasal drip  Neck: No JVD, no TMG, no carotid bruits  Lungs: No use of accessory muscles, no dullness to percussion, distant BS  Cardiovascular: RRR, heart sounds normal, no murmur or gallops, no peripheral edema  Abdomen: soft and NT, no HSM,  BS normal  Musculoskeletal: No deformities, no cyanosis or clubbing  Neuro: alert, non focal  Skin: Warm, no lesions or rashes  No results found.    Assessment & Plan:   COPD with emphysema Stable copd with emphysema Plan No change in inhaled or maintenance medications.     Updated Medication List Outpatient Encounter Prescriptions as of 10/08/2014  Medication Sig  . albuterol (PROAIR HFA) 108 (90 BASE) MCG/ACT inhaler Inhale 2 puffs into the lungs every 6 (six) hours as needed for wheezing.  . benzonatate (TESSALON) 200 MG capsule TAKE ONE CAPSULE 3-4 TIMES DAILY AS NEEDED FOR COUGH  . budesonide-formoterol (SYMBICORT) 160-4.5 MCG/ACT inhaler Inhale 2 puffs into the lungs 2 (two) times daily.  Marland Kitchen buPROPion (WELLBUTRIN SR) 200 MG 12 hr tablet Take 1 tablet (200 mg total) by mouth 2 (two) times daily.  . calcium carbonate (OS-CAL) 600 MG TABS Take 600  mg by mouth daily.   . diazepam (VALIUM) 5 MG tablet Take 5 mg by mouth every 6 (six) hours as needed for anxiety.  Marland Kitchen doxazosin (CARDURA) 4 MG tablet Take 4 mg by mouth at bedtime.  . fluticasone (FLONASE) 50 MCG/ACT nasal spray Place 2 sprays into the nose 2 (two) times daily.  . Guaifenesin (MUCINEX MAXIMUM STRENGTH) 1200 MG TB12 Take 600 mg by mouth as needed.   Marland Kitchen HYDROcodone-acetaminophen (NORCO/VICODIN) 5-325 MG per tablet Take 1 tablet by mouth every 6 (six) hours as needed.  . meclizine (ANTIVERT) 25 MG tablet Take 25 mg by mouth daily as needed (vertigo).   . montelukast (SINGULAIR) 10 MG tablet Take 10 mg  by mouth at bedtime as needed.  . Multiple Vitamin (MULTIVITAMIN WITH MINERALS) TABS Take 1 tablet by mouth daily.  . Pseudoephedrine-APAP-DM (DAYQUIL PO) Take 2 tablets by mouth daily.  Marland Kitchen rOPINIRole (REQUIP) 1 MG tablet Take 1 mg by mouth at bedtime.   . simvastatin (ZOCOR) 40 MG tablet Take 40 mg by mouth at bedtime.   . triamterene-hydrochlorothiazide (MAXZIDE-25) 37.5-25 MG per tablet Take 0.5 tablets by mouth daily.  Marland Kitchen HYDROcodone-homatropine (HYCODAN) 5-1.5 MG/5ML syrup Take 5 mLs by mouth every 6 (six) hours as needed for cough.  . [DISCONTINUED] HYDROcodone-homatropine (HYCODAN) 5-1.5 MG/5ML syrup Take 5 mLs by mouth every 6 (six) hours as needed for cough. (Patient not taking: Reported on 10/08/2014)  . [DISCONTINUED] oxyCODONE (OXY IR/ROXICODONE) 5 MG immediate release tablet Take 5 mg by mouth as needed for severe pain.

## 2014-10-08 NOTE — Patient Instructions (Signed)
No change in medications. Return in        6 months        

## 2014-10-09 NOTE — Assessment & Plan Note (Signed)
Stable copd with emphysema Plan No change in inhaled or maintenance medications.

## 2014-10-15 ENCOUNTER — Telehealth: Payer: Self-pay | Admitting: Internal Medicine

## 2014-10-15 DIAGNOSIS — Z8781 Personal history of (healed) traumatic fracture: Secondary | ICD-10-CM

## 2014-10-15 NOTE — Telephone Encounter (Signed)
I was contacted by the patient's pulmonology, the patient had several rib fractures in January 2016 triggered by cough, bone density test. I agree, we'll arrange a bone density test, let the patient know. Please enter the order : DX rib fractures

## 2014-10-15 NOTE — Telephone Encounter (Signed)
Dexa Scan ordered.   LMOM informing Pt to return call.

## 2014-10-16 NOTE — Telephone Encounter (Signed)
Micheal Frank, patient daughter returned phone call. Best # [phone]

## 2014-10-16 NOTE — Telephone Encounter (Signed)
Spoke with [name], Pt's daughter, informed her about Bone density test being ordered. [name] verbalized understanding and is requesting to have radiology call her to schedule appt instead of Pt.

## 2014-10-19 NOTE — Telephone Encounter (Signed)
Pt scheduled  

## 2014-10-26 ENCOUNTER — Ambulatory Visit (INDEPENDENT_AMBULATORY_CARE_PROVIDER_SITE_OTHER)
Admission: RE | Admit: 2014-10-26 | Discharge: 2014-10-26 | Disposition: A | Discharge status: Home / Self Care | Payer: Medicare Other | Source: Ambulatory Visit | Attending: Internal Medicine | Admitting: Internal Medicine

## 2014-10-26 DIAGNOSIS — Z8781 Personal history of (healed) traumatic fracture: Secondary | ICD-10-CM

## 2014-11-03 ENCOUNTER — Telehealth: Payer: Self-pay | Admitting: Internal Medicine

## 2014-11-03 NOTE — Telephone Encounter (Signed)
Some bone density test. T score is -2.3, in addition he had rib fractures, he has osteoporosis Please call patient, set up an appointment to discuss options: Fosamax, Prolia, Reclast?

## 2014-11-04 NOTE — Telephone Encounter (Signed)
LMOM for Micheal Frank, Pt's daughter to return call.

## 2014-11-10 NOTE — Telephone Encounter (Signed)
Unable to get in contact with Pt's daughter. Pt has a F/U appt scheduled for 11/16/2014 at 1100.

## 2014-11-10 NOTE — Telephone Encounter (Signed)
Faythe Ghee, will discuss on return to the office

## 2014-11-16 ENCOUNTER — Ambulatory Visit (INDEPENDENT_AMBULATORY_CARE_PROVIDER_SITE_OTHER): Payer: Medicare Other | Admitting: Internal Medicine

## 2014-11-16 ENCOUNTER — Encounter: Payer: Self-pay | Admitting: Internal Medicine

## 2014-11-16 ENCOUNTER — Telehealth: Payer: Self-pay | Admitting: Internal Medicine

## 2014-11-16 ENCOUNTER — Other Ambulatory Visit: Payer: Self-pay

## 2014-11-16 VITALS — BP 128/74 | HR 70 | Temp 97.4°F | Ht 69.0 in | Wt 161.5 lb

## 2014-11-16 DIAGNOSIS — M810 Age-related osteoporosis without current pathological fracture: Secondary | ICD-10-CM | POA: Diagnosis not present

## 2014-11-16 DIAGNOSIS — G5602 Carpal tunnel syndrome, left upper limb: Secondary | ICD-10-CM | POA: Diagnosis not present

## 2014-11-16 DIAGNOSIS — G5601 Carpal tunnel syndrome, right upper limb: Secondary | ICD-10-CM

## 2014-11-16 DIAGNOSIS — G5600 Carpal tunnel syndrome, unspecified upper limb: Secondary | ICD-10-CM | POA: Insufficient documentation

## 2014-11-16 DIAGNOSIS — G5603 Carpal tunnel syndrome, bilateral upper limbs: Secondary | ICD-10-CM

## 2014-11-16 MED ORDER — ZOSTER VACCINE LIVE 19400 UNT/0.65ML ~~LOC~~ SOLR: 0.6500 mL | Freq: Once | SUBCUTANEOUS | Status: DC

## 2014-11-16 NOTE — Assessment & Plan Note (Signed)
Recent DEXA scan revealed T score of -2.3 which can be classified as osteoporosis when combined with the history of pelvic and rib fracture.  The patient   is at high risk for falls, he also mention difficulty swallowing his solid food sometimes. Plan: Patient advised on importance of fall prevention.  Patient currently taking Vitamin D and Calcium supplementation We discussed medication such as Fosamax , reclast and Prolia ; I think is a great candidate for Prolia, the patient is in agreement, will schedule his injections following insurance approval.

## 2014-11-16 NOTE — Patient Instructions (Signed)
  Come back to the office in 4 months   for a routine check up     Please schedule an appointment at the front desk    Come back fasting

## 2014-11-16 NOTE — Progress Notes (Signed)
Subjective:    Patient ID: Micheal Frank, male    DOB: 1926/01/14, 81 y.o.   MRN: [id number]  DOS:  11/16/2014 Type of visit - description : rov. Here w/ his daughter Interval history:  Patient is a 81 year old male with a history of COPD, DJD, glaucoma,  in today for a follow up office visit.  Micheal Frank recently completed a DEXA scan on 10/26/14 following a pulmonology visit where it was revealed he broke a few ribs during a coughing fit. He fractured his pelvis two years ago following a fall on concrete. He notes that he does use a cane sometimes while walking but not always, and that he doesn't feel unsteady while walking.  He has a history of Meniere's disease which will flare up every 6 months and resolve quickly with Diazepam.   He also notes increased nighttime urinary frequency not accompanied by any dysuria, hematuria, or difficulty urinating. He does mentions that he enjoys a coffee before bedtime.   He also states that he wakes up at night with severe numbness/tingling and pain bilaterally in his arms and hands distal to the elbow. He has braces which he puts on when he wakes up and notes that they relieve the pain. He has not tried putting on the braces before initially going to bed.    Review of Systems  Constitutional: No fever, chills.  Respiratory: No difficulty breathing. Cardiovascular: No CP or palpitations. Mild left lower leg swelling GI: no nausea, vomiting, diarrhea or abdominal pain.  Difficulty swallowing.  GU: No dysuria or hematuria. Increased frequency of nighttime urination Musculoskeletal: Right knee pain occasionally Skin: No change in the color of the skin, palor or rash Neurological: No dizziness or syncope. No headaches. No diplopia, slurred speech, motor deficits, facial numbness Bilateral arm numbness/tingling and pain at night Hematological: No enlarged lymph nodes, easy bruising or bleeding Psychiatry: No suicidal ideas, hallucinations,  behavior problems or confusion. No unusual/severe anxiety or depression.     Past Medical History  Diagnosis Date  . Subdural hematoma 03/30/11  . Epidural abscess   . Staphylococcus aureus bacteremia with sepsis   . Prosthetic joint infection   . Hyperlipidemia   . History of blood clots   . Blood dyscrasia     pt CAN NOT have any blood thinners d/t hx brain bleed  . S/P right heart catheterization     at least 27yrs  . Asthma   . COPD (chronic obstructive pulmonary disease)     emphysema  . Shortness of breath     with exertion  . Pneumonia     as a child and in 1952;had a pneumonia vaccine couple of years ago  . Peripheral neuropathy   . Short-term memory loss   . Arthritis     left leg also has swelling  . Gastric ulcer   . Constipation     miralax prn  . Depression     takes wellbutrin bid  . Urinary frequency     wears depends  . Complication of anesthesia     confusion with anesthesia  . Sleep apnea     has CPAP but doesn't use it;sleep study done at least 2yrs ago  . Melanoma   . Chronic kidney disease, stage III (moderate) 01/20/2013    Past Surgical History  Procedure Laterality Date  . Hernia repair      25+yrs ago  . External ear surgery      shunt placed in  right ear d/t mennieres   . Total knee arthroplasty      x 2 left knee 2003/2012  . Filtering procedure      filter placed in neck d/t blood  clot  . Eye surgery      bil cataract surgery  . Skin biopsy      melanoma on head   . Knee arthroscopy    . Total knee revision  05/23/2011    Procedure: TOTAL KNEE REVISION;  Surgeon: Meredith Pel;  Location: [facility];  Service: Orthopedics;  Laterality: Left;  LEFT KNEE REMOVAL OF SPACER, POSSIBLE REIMPLANTATION OF REVISION TKA VERSES REPLACEMENT ANTIBIOTIC SPACER  . Epidural abscess drainage  12/2010    History   Social History  . Marital Status: Married    Spouse Name: N/A  . Number of Children: 3  . Years of Education: N/A   Occupational  History  . retired    Social History Main Topics  . Smoking status: Former Smoker -- 2.00 packs/day for 40 years    Types: Cigarettes    Quit date: 07/17/1978  . Smokeless tobacco: Never Used  . Alcohol Use: No  . Drug Use: No  . Sexual Activity: Not on file   Other Topics Concern  . Not on file   Social History Narrative   Lost wife 06-2013   Lives in his house, 1 daughter and her 2 adult children live w/ him (1g-child w/ special needs)        Family History  Problem Relation Age of Onset  . CAD Neg Hx   . Diabetes Neg Hx   . Melanoma Brother        Medication List       This list is accurate as of: 11/16/14  8:34 PM.  Always use your most recent med list.               albuterol 108 (90 BASE) MCG/ACT inhaler  Commonly known as:  PROAIR HFA  Inhale 2 puffs into the lungs every 6 (six) hours as needed for wheezing.     benzonatate 200 MG capsule  Commonly known as:  TESSALON  TAKE ONE CAPSULE 3-4 TIMES DAILY AS NEEDED FOR COUGH     budesonide-formoterol 160-4.5 MCG/ACT inhaler  Commonly known as:  SYMBICORT  Inhale 2 puffs into the lungs 2 (two) times daily.     buPROPion 200 MG 12 hr tablet  Commonly known as:  WELLBUTRIN SR  Take 1 tablet (200 mg total) by mouth 2 (two) times daily.     calcium carbonate 600 MG Tabs tablet  Commonly known as:  OS-CAL  Take 600 mg by mouth daily.     DAYQUIL PO  Take 2 tablets by mouth daily.     diazepam 5 MG tablet  Commonly known as:  VALIUM  Take 5 mg by mouth every 6 (six) hours as needed for anxiety.     doxazosin 4 MG tablet  Commonly known as:  CARDURA  Take 4 mg by mouth at bedtime.     fluticasone 50 MCG/ACT nasal spray  Commonly known as:  FLONASE  Place 2 sprays into the nose 2 (two) times daily.     HYDROcodone-acetaminophen 5-325 MG per tablet  Commonly known as:  NORCO/VICODIN  Take 1 tablet by mouth every 6 (six) hours as needed.     meclizine 25 MG tablet  Commonly known as:  ANTIVERT    Take 25 mg by mouth daily as  needed (vertigo).     montelukast 10 MG tablet  Commonly known as:  SINGULAIR  Take 10 mg by mouth at bedtime as needed.     multivitamin with minerals Tabs tablet  Take 1 tablet by mouth daily.     rOPINIRole 1 MG tablet  Commonly known as:  REQUIP  Take 1 mg by mouth at bedtime.     simvastatin 40 MG tablet  Commonly known as:  ZOCOR  Take 40 mg by mouth at bedtime.     triamterene-hydrochlorothiazide 37.5-25 MG per tablet  Commonly known as:  MAXZIDE-25  Take 0.5 tablets by mouth daily.     zoster vaccine live (PF) 19400 UNT/0.65ML injection  Commonly known as:  ZOSTAVAX  Inject 19,400 Units into the skin once.           Objective:   Physical Exam BP 128/74 mmHg  Pulse 70  Temp(Src) 97.4 F (36.3 C) (Oral)  Ht 5\' 9"  (1.753 m)  Wt 161 lb 8 oz (73.256 kg)  BMI 23.84 kg/m2  SpO2 96%  General:   Well developed, well nourished . NAD.  HEENT:  Normocephalic . Face symmetric, atraumatic Lungs:  Decreased breath sounds Normal respiratory effort, no intercostal retractions, no accessory muscle use. Heart: RRR,  no murmur.  Muscle skeletal: Left lower leg pitting edema (trace) Skin: Not pale. Not jaundice Neurologic:  alert & oriented X3.  Speech normal, gait appropriate for age, assisted by cane Psych--  Cognition and judgment appear intact.  Cooperative with normal attention span and concentration.  Behavior appropriate. No anxious or depressed appearing.       Assessment & Plan:    (Patient seen along with   Aletta Edouard, medical student)  Patient had shingles a few years ago, he wonders about the need for Zostavax, I think is appropriate, Rx provided   COPD: Well-controlled on current medications. Continue current regimen.  Meniere's Disease:  Well controlled on current medications, continue current regimen of diazepam prn for acute episodes.   Today , I spent more than  25  min with the patient: >50% of the  time counseling regards pro-cons of treatment of osteoporosis, treatment options

## 2014-11-16 NOTE — Telephone Encounter (Signed)
I have electronically sent pt's info for Prolia insurance verification and will notify you once I have a response. Thank you. °

## 2014-11-16 NOTE — Assessment & Plan Note (Signed)
Has a history of carpal tunnel syndrome, now complaining of bilateral numbness and tingling with severe pain @ forearms and hands at night. Patient wakes up with pain and puts previously rx  braces provided by Dr. Marlou Sa which alleviate the symptoms.  Although symptoms are somewhat atypical for CTS (sx at both sides of hands), he is getting better with CTS braces. Plan: Patient instructed to try putting on braces before going to bed to see if this will completely alleviate symptoms.  Patient advised about importance of treating carpal tunnel syndrome before nerves become irreparably damaged.

## 2014-11-16 NOTE — Progress Notes (Signed)
Pre visit review using our clinic review tool, if applicable. No additional management support is needed unless otherwise documented below in the visit note. 

## 2014-11-19 ENCOUNTER — Other Ambulatory Visit (HOSPITAL_COMMUNITY): Payer: Self-pay | Admitting: Dermatology

## 2014-11-20 NOTE — Telephone Encounter (Signed)
Pt's insurance does not cover Prolia, cost will be completely out-of-pocket. Pt also does not have Part D coverage.

## 2014-11-20 NOTE — Telephone Encounter (Signed)
I have rec'd notice in ref to Mr. [facility] insurance verification.  Unfortunately, his insurance plan does not cover Prolia through his medical plan.  If pt has Part D coverage, then he should check w/his pharmacy and get them to run an insurance verification to see if it is covered through his Part D.  I am sending a copy of the notice to be scanned into his chart. If you have questions, please let me know. Thank you.  If he doesn't have coverage through Part D either or if he has coverage but can't afford his injection, please advise him to contact Prolia at [phone] and choose opt. #1 to see if he qualifies for one of their assistance programs.  If he qualifies they will instruct him how to proceed.  Thank you.

## 2014-11-24 ENCOUNTER — Other Ambulatory Visit: Payer: Self-pay

## 2014-11-24 NOTE — Telephone Encounter (Signed)
Date creatinine clearance indicate that his kidney function is  slightly decreased consequently I like the specialist to okay him taking something like Fosamax or Boniva. Diagnosis for the referral osteoporosis

## 2014-11-24 NOTE — Telephone Encounter (Signed)
Unsure exactly what a creatinine clearance is. Will Pt's daughter know what this is? Dx for endocrinology referral?

## 2014-11-24 NOTE — Telephone Encounter (Signed)
Options are limited due to creatinine clearance of 35. Please arrange a endocrinology referral.

## 2014-11-26 NOTE — Telephone Encounter (Signed)
Spoke with [name], Pt's daughter, informed her that unfortunately Prolia is not covered by her dad's insurance plan. That Dr. Larose Kells recommends referral to endocrinology for clearance of medication such as Boniva or Fosamax. [name] verbalized understanding. Referral placed to Endo.

## 2014-11-27 ENCOUNTER — Telehealth: Payer: Self-pay | Admitting: Internal Medicine

## 2014-11-27 NOTE — Telephone Encounter (Signed)
Vitamin D should be between 600 and  1200 units. [facility]  endocrinology referral, will talk to him at the next opportunity

## 2014-11-27 NOTE — Telephone Encounter (Signed)
Per Pt and family and Dr. Larose Kells okay to cancel endocrinology referral.

## 2014-11-27 NOTE — Telephone Encounter (Signed)
Please advise 

## 2014-11-27 NOTE — Telephone Encounter (Signed)
Caller: Herbert Deaner, dtr Ph#: [phone]  [name] called back and she states that her father said he is not going to go to Endo. He does not want to try the other medications. She asked if there is a dosage change Potassium & Vitamin D. Please call.

## 2014-11-27 NOTE — Telephone Encounter (Signed)
Spoke with Pt's daughter, [name], informed her of Dr. Larose Kells recommendations. [name] verbalized understanding.

## 2015-01-13 ENCOUNTER — Telehealth: Payer: Self-pay | Admitting: Internal Medicine

## 2015-01-13 MED ORDER — ROPINIROLE HCL 1 MG PO TABS: 1.0000 mg | ORAL_TABLET | Freq: Every day | ORAL | Status: DC

## 2015-01-13 NOTE — Telephone Encounter (Signed)
Pt is requesting refill on Requip. Previously refilled by [facility] doctor before establishing with you. Last OV: 11/16/2014, next appt scheduled for 03/26/2015 at 1045. Okay to refill?

## 2015-01-13 NOTE — Telephone Encounter (Signed)
Yes, refill 6 months

## 2015-01-13 NOTE — Telephone Encounter (Signed)
Caller name: Herbert Deaner Relationship to patient: daughter Can be reached: [phone] [facility] [address] [facility]  Reason for call: Pt is needing refill on rOPINIRole (REQUIP) 1 MG tablet. It was previously prescribed by [facility] doctor before they changed to LBSW. Pt has 5 pills left. Takes 1/day.

## 2015-01-13 NOTE — Telephone Encounter (Signed)
[facility] to [facility] as requested.

## 2015-02-10 ENCOUNTER — Other Ambulatory Visit: Payer: Self-pay | Admitting: Internal Medicine

## 2015-02-10 NOTE — Telephone Encounter (Signed)
Spoke with [name], Pt's daughter, informed her that Dr. Larose Kells will be out of the office until Monday, August 1, and that his covering provider didn't feel comfortable refilling since she has never seen Pt. I did inform her that we sent a message to Rexene Edison, NP which she may refill. [name] verbalized understanding and stated that if not Pt does have enough until Dr. Larose Kells returns. [name] stated she would let me know if Tammy refills Hydrocodone syrup.

## 2015-02-10 NOTE — Telephone Encounter (Signed)
Dr. Larose Kells is on vacation and I am not comfortable refilling this med since I have never seen the patient.  I see that he is scheduled to establish with Rexene Edison NP with pulmonology.  I will forward this request to her. Please notify daughter.

## 2015-02-10 NOTE — Telephone Encounter (Signed)
Pt's daughter [name] is calling requesting a refill on Hydrocodone syrup for Pt. However, it is not maintained by Dr. Larose Kells but by Dr. Joya Gaskins who they say no longer practices. [name] is requesting Dr. Larose Kells to refill. Please advise.

## 2015-02-10 NOTE — Telephone Encounter (Signed)
Pulmonary triage can send to Dr. Joya Gaskins  Who is still practicing until I see in the office .

## 2015-02-10 NOTE — Telephone Encounter (Signed)
Caller name: Meriel Pica Relationship to patient: daughter Can be reached: [phone]   Reason for call: Pt was getting HYDROcodone-homatropine (HYCODAN) 5-1.5 MG/5ML syrup  From Dr. Joya Gaskins. Dr. Joya Gaskins is no longer practicing and they want Dr. Larose Kells to fill. He uses it PRN. Please call when they can p/u RX.

## 2015-02-11 ENCOUNTER — Telehealth: Payer: Self-pay | Admitting: Critical Care Medicine

## 2015-02-11 MED ORDER — HYDROCODONE-HOMATROPINE 5-1.5 MG/5ML PO SYRP: ORAL_SOLUTION | ORAL | Status: DC

## 2015-02-11 NOTE — Telephone Encounter (Signed)
i am ok refilling the cough syrup

## 2015-02-11 NOTE — Telephone Encounter (Signed)
Called and left detailed message on Micheal Frank's voicemail advising her that Rx is at front desk to be picked up. Cannot call in this medication, it has to have hardcopy (See TE 02/10/15) Nothing further needed.

## 2015-02-11 NOTE — Telephone Encounter (Signed)
Rx printed and signed by Dr. Annamaria Boots Left at front to be picked up. Called and left detailed message advising patient that RX is ready for pick up. Nothing further needed.

## 2015-03-01 ENCOUNTER — Telehealth: Payer: Self-pay | Admitting: Internal Medicine

## 2015-03-01 MED ORDER — DOXAZOSIN MESYLATE 4 MG PO TABS: 4.0000 mg | ORAL_TABLET | Freq: Every day | ORAL | Status: DC

## 2015-03-01 NOTE — Telephone Encounter (Signed)
Cardura sent to [facility] as requested.

## 2015-03-01 NOTE — Telephone Encounter (Signed)
Caller name:Lynn Relation to UZ:[id number] Call back number:[phone] Pharmacy:[facility]  Reason for call: pt is needing rxdoxazosin (CARDURA) 4 MG tablet

## 2015-03-26 ENCOUNTER — Ambulatory Visit: Payer: Medicare Other | Admitting: Internal Medicine

## 2015-04-06 ENCOUNTER — Encounter: Payer: Self-pay | Admitting: Internal Medicine

## 2015-04-06 ENCOUNTER — Ambulatory Visit (INDEPENDENT_AMBULATORY_CARE_PROVIDER_SITE_OTHER): Payer: Medicare Other | Admitting: Internal Medicine

## 2015-04-06 VITALS — BP 124/62 | HR 65 | Temp 97.8°F | Ht 69.0 in | Wt 163.0 lb

## 2015-04-06 DIAGNOSIS — M810 Age-related osteoporosis without current pathological fracture: Secondary | ICD-10-CM | POA: Diagnosis not present

## 2015-04-06 DIAGNOSIS — Z23 Encounter for immunization: Secondary | ICD-10-CM

## 2015-04-06 DIAGNOSIS — H8109 Meniere's disease, unspecified ear: Secondary | ICD-10-CM

## 2015-04-06 DIAGNOSIS — E785 Hyperlipidemia, unspecified: Secondary | ICD-10-CM

## 2015-04-06 DIAGNOSIS — J432 Centrilobular emphysema: Secondary | ICD-10-CM | POA: Diagnosis not present

## 2015-04-06 DIAGNOSIS — R42 Dizziness and giddiness: Secondary | ICD-10-CM | POA: Diagnosis not present

## 2015-04-06 DIAGNOSIS — Z09 Encounter for follow-up examination after completed treatment for conditions other than malignant neoplasm: Secondary | ICD-10-CM

## 2015-04-06 LAB — BASIC METABOLIC PANEL
BUN: 26 mg/dL — AB (ref 6–23)
CHLORIDE: 106 meq/L (ref 96–112)
CO2: 28 meq/L (ref 19–32)
Calcium: 9.4 mg/dL (ref 8.4–10.5)
Creatinine, Ser: 1.67 mg/dL — ABNORMAL HIGH (ref 0.40–1.50)
GFR: 41.5 mL/min — AB (ref >60.00)
Glucose, Bld: 84 mg/dL (ref 70–99)
POTASSIUM: 4 meq/L (ref 3.5–5.1)
SODIUM: 141 meq/L (ref 135–145)

## 2015-04-06 LAB — LIPID PANEL
CHOL/HDL RATIO: 3
Cholesterol: 125 mg/dL (ref 0–200)
HDL: 46.9 mg/dL (ref >39.00)
LDL CALC: 63 mg/dL (ref 0–99)
NonHDL: 78.48
Triglycerides: 76 mg/dL (ref 0.0–149.0)
VLDL: 15.2 mg/dL (ref 0.0–40.0)

## 2015-04-06 MED ORDER — ALENDRONATE SODIUM 70 MG PO TABS: 70.0000 mg | ORAL_TABLET | ORAL | Status: DC

## 2015-04-06 NOTE — Progress Notes (Signed)
Subjective:    Patient ID: Micheal Frank, male    DOB: Jun 29, 1926, 81 y.o.   MRN: [id number]  DOS:  04/06/2015 Type of visit - description : Routine visit, here with his daughter Interval history: Osteoporosis: Unable to take prolia, not covered by his insurance COPD: Good compliance with medications, minimal symptoms Mnire's: On diuretics daily, symptoms well-controlled High cholesterol: From statins, due for a FLP Depression: on Wellbutrin, symptoms well-controlled  Review of Systems Denies chest pain or lower extremity edema No nausea, vomiting, diarrhea  Past Medical History  Diagnosis Date  . Subdural hematoma 03/30/11  . Epidural abscess   . Staphylococcus aureus bacteremia with sepsis   . Prosthetic joint infection   . Hyperlipidemia   . History of blood clots   . Blood dyscrasia     pt CAN NOT have any blood thinners d/t hx brain bleed  . S/P right heart catheterization     at least 82yrs  . Asthma   . COPD (chronic obstructive pulmonary disease)     emphysema  . Shortness of breath     with exertion  . Pneumonia     as a child and in 1952;had a pneumonia vaccine couple of years ago  . Peripheral neuropathy   . Short-term memory loss   . Arthritis     left leg also has swelling  . Gastric ulcer   . Constipation     miralax prn  . Depression     takes wellbutrin bid  . Urinary frequency     wears depends  . Complication of anesthesia     confusion with anesthesia  . Sleep apnea     has CPAP but doesn't use it;sleep study done at least 98yrs ago  . Melanoma   . Chronic kidney disease, stage III (moderate) 01/20/2013    Past Surgical History  Procedure Laterality Date  . Hernia repair      25+yrs ago  . External ear surgery      shunt placed in right ear d/t mennieres   . Total knee arthroplasty      x 2 left knee 2003/2012  . Filtering procedure      filter placed in neck d/t blood  clot  . Eye surgery      bil cataract surgery  . Skin biopsy       melanoma on head   . Knee arthroscopy    . Total knee revision  05/23/2011    Procedure: TOTAL KNEE REVISION;  Surgeon: Meredith Pel;  Location: [facility];  Service: Orthopedics;  Laterality: Left;  LEFT KNEE REMOVAL OF SPACER, POSSIBLE REIMPLANTATION OF REVISION TKA VERSES REPLACEMENT ANTIBIOTIC SPACER  . Epidural abscess drainage  12/2010    Social History   Social History  . Marital Status: Married    Spouse Name: N/A  . Number of Children: 3  . Years of Education: N/A   Occupational History  . retired    Social History Main Topics  . Smoking status: Former Smoker -- 2.00 packs/day for 40 years    Types: Cigarettes    Quit date: 07/17/1978  . Smokeless tobacco: Never Used  . Alcohol Use: No  . Drug Use: No  . Sexual Activity: Not on file   Other Topics Concern  . Not on file   Social History Narrative   Lost wife 06-2013   Lives in his house, 1 daughter and her 2 adult children live w/ him (1g-child w/ special needs)  Medication List       This list is accurate as of: 04/06/15  5:46 PM.  Always use your most recent med list.               albuterol 108 (90 BASE) MCG/ACT inhaler  Commonly known as:  PROAIR HFA  Inhale 2 puffs into the lungs every 6 (six) hours as needed for wheezing.     alendronate 70 MG tablet  Commonly known as:  FOSAMAX  Take 1 tablet (70 mg total) by mouth every 7 (seven) days. Take with a full glass of water on an empty stomach.     benzonatate 200 MG capsule  Commonly known as:  TESSALON  TAKE ONE CAPSULE 3-4 TIMES DAILY AS NEEDED FOR COUGH     budesonide-formoterol 160-4.5 MCG/ACT inhaler  Commonly known as:  SYMBICORT  Inhale 2 puffs into the lungs 2 (two) times daily.     buPROPion 200 MG 12 hr tablet  Commonly known as:  WELLBUTRIN SR  Take 1 tablet (200 mg total) by mouth 2 (two) times daily.     calcium carbonate 600 MG Tabs tablet  Commonly known as:  OS-CAL  Take 600 mg by mouth daily.     DAYQUIL  PO  Take 2 tablets by mouth daily.     diazepam 5 MG tablet  Commonly known as:  VALIUM  Take 5 mg by mouth every 6 (six) hours as needed for anxiety.     doxazosin 4 MG tablet  Commonly known as:  CARDURA  Take 1 tablet (4 mg total) by mouth at bedtime.     fluticasone 50 MCG/ACT nasal spray  Commonly known as:  FLONASE  Place 2 sprays into the nose 2 (two) times daily.     HYDROcodone-acetaminophen 5-325 MG per tablet  Commonly known as:  NORCO/VICODIN  Take 1 tablet by mouth every 6 (six) hours as needed.     HYDROcodone-homatropine 5-1.5 MG/5ML syrup  Commonly known as:  HYCODAN  1/2-1 tsp by mouth every 6 hours as needed for cough     meclizine 25 MG tablet  Commonly known as:  ANTIVERT  Take 25 mg by mouth daily as needed (vertigo).     montelukast 10 MG tablet  Commonly known as:  SINGULAIR  Take 10 mg by mouth at bedtime as needed.     multivitamin with minerals Tabs tablet  Take 1 tablet by mouth daily.     rOPINIRole 1 MG tablet  Commonly known as:  REQUIP  Take 1 tablet (1 mg total) by mouth at bedtime.     simvastatin 40 MG tablet  Commonly known as:  ZOCOR  Take 40 mg by mouth at bedtime.     triamterene-hydrochlorothiazide 37.5-25 MG per tablet  Commonly known as:  MAXZIDE-25  Take 0.5 tablets by mouth daily.           Objective:   Physical Exam BP 124/62 mmHg  Pulse 65  Temp(Src) 97.8 F (36.6 C) (Oral)  Ht 5\' 9"  (1.753 m)  Wt 163 lb (73.936 kg)  BMI 24.06 kg/m2  SpO2 95% General:   Well developed, well nourished . NAD.  HEENT:  Normocephalic . Face symmetric, atraumatic Lungs:  Decreased breath sounds but otherwise clear Normal respiratory effort, no intercostal retractions, no accessory muscle use. Heart: RRR, soft systolic murmurtibial edema bilaterally  Skin: Not pale. Not jaundice Neurologic:  alert & oriented X3.  Speech normal, gait appropriate for age and unassisted Psych--  Cognition and judgment appear intact.    Cooperative with normal attention span and concentration.  Behavior appropriate. No anxious or depressed appearing.      Assessment & Plan:   Problem list > COPD Chronic renal insufficiency Hyperlipidemia Depression Decreased memory Neuropathy H/o Mnire's  -- on diuretics  Osteoporosis -- h/o pelvic Fx, dexa April 2016: T score (-) 2.3 DJD   RLS OSA no CPAP H/o melanoma H/o subdural hematoma, epidural abscess, staph aureus  sepsis H/o Prosthetic joint infection H/o Blood clots H/o gastric ulcer  A/P Mnire's: On daily diuretics, check a BMP Osteoporosis: Was prescribed prolia ---> not covered by insurance, I discussed options and likes to try Fosamax, precautions carefully  discussed COPD: Seems at baseline, follow-up by pulmonary  Hyperlipidemia: Check FLP although he is not fasting. Primary care: Flu shot and TD  today

## 2015-04-06 NOTE — Assessment & Plan Note (Signed)
Mnire's: On daily diuretics, check a BMP Osteoporosis: Was prescribed prolia ---> not covered by insurance, I discussed options and likes to try Fosamax, precautions carefully  discussed COPD: Seems at baseline, follow-up by pulmonary  Hyperlipidemia: Check FLP although he is not fasting. Primary care: Flu shot and TD  today

## 2015-04-06 NOTE — Patient Instructions (Signed)
Get your blood work before you leave      Next visit  for a  checkup in 4 to 6 months  (30 minutes) Please schedule an appointment at the front desk No need to come back fasting   Start Fosamax, one tablet weekly, please remember the precautions we discussed today. If you have any problem call the office

## 2015-04-06 NOTE — Progress Notes (Signed)
Pre visit review using our clinic review tool, if applicable. No additional management support is needed unless otherwise documented below in the visit note. 

## 2015-04-15 ENCOUNTER — Encounter: Payer: Self-pay | Admitting: Adult Health

## 2015-04-15 ENCOUNTER — Telehealth: Payer: Self-pay | Admitting: Internal Medicine

## 2015-04-15 ENCOUNTER — Ambulatory Visit (INDEPENDENT_AMBULATORY_CARE_PROVIDER_SITE_OTHER): Payer: Medicare Other | Admitting: Adult Health

## 2015-04-15 VITALS — BP 137/79 | HR 67 | Temp 97.5°F | Ht 69.5 in | Wt 162.0 lb

## 2015-04-15 DIAGNOSIS — J439 Emphysema, unspecified: Secondary | ICD-10-CM

## 2015-04-15 NOTE — Telephone Encounter (Signed)
Pt called requesting refill on Simvastatin 40 mg. Currently on medication list but do not see where we have refilled it. Please advise.

## 2015-04-15 NOTE — Telephone Encounter (Signed)
Relation to MC:EYEM Call back [place]: [id number], [place] [address] [phone] (Phone) [phone] (Fax)         Reason for call:  Requesting a refill simvastatin (ZOCOR) 40 MG tablet

## 2015-04-15 NOTE — Patient Instructions (Signed)
No change in medications. Return in  1 year with Dr. Elsworth Soho  And As needed

## 2015-04-15 NOTE — Assessment & Plan Note (Signed)
Doing well on current regimen with no flare  Vaccines utd   Plan  Continue on Albuterol /Iprtropium Neb Four times a day   Stop Vapor e cigs .  Follow up with Dr. Elsworth Soho  In 3 month and .As needed   Please contact office for sooner follow up if symptoms do not improve or worsen or seek emergency care

## 2015-04-15 NOTE — Progress Notes (Signed)
   Subjective:    Patient ID: SCHYLER COUNSELL, male    DOB: Jun 12, 1926, 81 y.o.   MRN: [id number]  HPI 81 yo male with COPD, GOLD Stage C , former smoker   04/15/2015 Follow up : COPD  Pt returns for 6 month follow up for COPD.   Remains on Symbicort Twice daily .  Over all he says he is doing okay, no flare of cough or dyspnea. Gets winded with stairs.  Says he is active around the house. Uses a cane.  CXR in Jan with COPD changes and rib fx.  Flu shot utd. PVX and Prevnar utd. TDAP utd.  Denies chest pain, orthopnea , edema or fever.     Review of Systems Constitutional:   No  weight loss, night sweats,  Fevers, chills, fatigue, or  lassitude.  HEENT:   No headaches,  Difficulty swallowing,  Tooth/dental problems, or  Sore throat,                No sneezing, itching, ear ache, nasal congestion, post nasal drip,   CV:  No chest pain,  Orthopnea, PND, swelling in lower extremities, anasarca, dizziness, palpitations, syncope.   GI  No heartburn, indigestion, abdominal pain, nausea, vomiting, diarrhea, change in bowel habits, loss of appetite, bloody stools.   Resp: .  No excess mucus, no productive cough,  No non-productive cough,  No coughing up of blood.  No change in color of mucus.  No wheezing.  No chest wall deformity  Skin: no rash or lesions.  GU: no dysuria, change in color of urine, no urgency or frequency.  No flank pain, no hematuria   MS:  No joint pain or swelling.  No decreased range of motion.  No back pain.  Psych:  No change in mood or affect. No depression or anxiety.  No memory loss.          Objective:   Physical Exam GEN: A/Ox3; pleasant , NAD, frail and elderly , walks with cane   HEENT:  [place]/AT,  EACs-clear, TMs-wnl, NOSE-clear, THROAT-clear, no lesions, no postnasal drip or exudate noted.   NECK:  Supple w/ fair ROM; no JVD; normal carotid impulses w/o bruits; no thyromegaly or nodules palpated; no lymphadenopathy.  RESP  Decreased BS in  bases  W/o wheezes/ rales/ or rhonchi.no accessory muscle use, no dullness to percussion  CARD:  RRR, no m/r/g  , no peripheral edema, pulses intact, no cyanosis or clubbing.  GI:   Soft & nt; nml bowel sounds; no organomegaly or masses detected.  Musco: Warm bil, no deformities or joint swelling noted.   Neuro: alert, no focal deficits noted.    Skin: Warm, no lesions or rashes         Assessment & Plan:

## 2015-04-15 NOTE — Progress Notes (Signed)
Reviewed & agree with plan  

## 2015-04-15 NOTE — Telephone Encounter (Signed)
Okay refill for a year

## 2015-04-16 MED ORDER — SIMVASTATIN 40 MG PO TABS: 40.0000 mg | ORAL_TABLET | Freq: Every day | ORAL | Status: DC

## 2015-04-16 NOTE — Telephone Encounter (Signed)
Simvastatin 40 mg sent to [facility] #90 tabs and 3 refills.

## 2015-05-10 ENCOUNTER — Telehealth: Payer: Self-pay | Admitting: Internal Medicine

## 2015-05-10 NOTE — Telephone Encounter (Signed)
Caller name: Herbert Deaner   Relationship to patient: Daughter   Can be reached: [phone]    Reason for call: she called in because she says that she has received a notice from her fathers insurance stating that on DOS 9/20 he received his tetanus and flu shot, insurance is stating that they will not pay for them. She isn't sure of what she should do.   Please advise.   Thanks.

## 2015-05-10 NOTE — Telephone Encounter (Signed)
I am sorry to hear that; provide immunization is well within the guidelines of care. Recommend to discuss with administration or again with his insurance

## 2015-05-10 NOTE — Telephone Encounter (Signed)
Unsure why Pt's insurance will not cover, he is 81 years old. Not sure what to do in regards to this? Please advise.

## 2015-05-18 ENCOUNTER — Other Ambulatory Visit: Payer: Self-pay | Admitting: Critical Care Medicine

## 2015-05-18 ENCOUNTER — Telehealth: Payer: Self-pay | Admitting: Internal Medicine

## 2015-05-18 MED ORDER — DOXAZOSIN MESYLATE 4 MG PO TABS: 4.0000 mg | ORAL_TABLET | Freq: Every day | ORAL | Status: DC

## 2015-05-18 NOTE — Telephone Encounter (Signed)
Caller name: Herbert Deaner   Relationship to patient: Daughter   Can be reached: [phone]  Pharmacy: [id number], [place]  Reason for call: She is requesting a refill on her fathers Rx for   doxazosin

## 2015-05-18 NOTE — Telephone Encounter (Signed)
Rx sent, #30 and 6 refills.

## 2015-05-21 ENCOUNTER — Telehealth: Payer: Self-pay | Admitting: Adult Health

## 2015-05-21 MED ORDER — BENZONATATE 200 MG PO CAPS
ORAL_CAPSULE | ORAL | Status: DC
Start: 2015-05-21 — End: 2015-08-16

## 2015-05-21 NOTE — Telephone Encounter (Signed)
Called and spoke with pt's daughter Daughter stated pt needed a refill on benzonate  Informed daughter that refill would be sent to pt's pharmacy  Refill sent electronically Nothing further is needed

## 2015-06-28 ENCOUNTER — Telehealth: Payer: Self-pay | Admitting: Pulmonary Disease

## 2015-06-28 ENCOUNTER — Other Ambulatory Visit: Payer: Self-pay | Admitting: Internal Medicine

## 2015-06-28 NOTE — Telephone Encounter (Signed)
Pt is needing a refill on Hycodan. He last saw TP in 03/2015. This was last refilled on 02/11/15.  TP - please advise. Thanks.

## 2015-06-29 MED ORDER — HYDROCODONE-HOMATROPINE 5-1.5 MG/5ML PO SYRP: ORAL_SOLUTION | ORAL | Status: DC

## 2015-06-29 NOTE — Telephone Encounter (Signed)
Awaiting TP's response

## 2015-06-29 NOTE — Telephone Encounter (Signed)
Spoke with pt's daughter. She is aware of the below information. Rx is ready to be picked up. Nothing further was needed.

## 2015-06-29 NOTE — Telephone Encounter (Signed)
That is fine , please tell his this is sedating to limit use and not to combine with any other pain meds.  Hydromet 1/2 tsp Twice daily  As needed cough  Cough # 4oz , may make you sleepy  No refills ,  If cough is worse or not resolving , will need ov  Keep follow up as planned and As needed   Please contact office for sooner follow up if symptoms do not improve or worsen or seek emergency care

## 2015-08-04 ENCOUNTER — Ambulatory Visit (HOSPITAL_BASED_OUTPATIENT_CLINIC_OR_DEPARTMENT_OTHER)
Admission: RE | Admit: 2015-08-04 | Discharge: 2015-08-04 | Disposition: A | Discharge status: Home / Self Care | Payer: Medicare Other | Source: Ambulatory Visit | Attending: Internal Medicine | Admitting: Internal Medicine

## 2015-08-04 ENCOUNTER — Ambulatory Visit (INDEPENDENT_AMBULATORY_CARE_PROVIDER_SITE_OTHER): Payer: Medicare Other | Admitting: Internal Medicine

## 2015-08-04 ENCOUNTER — Encounter: Payer: Self-pay | Admitting: Internal Medicine

## 2015-08-04 VITALS — BP 124/72 | HR 73 | Temp 98.1°F | Ht 69.5 in | Wt 169.5 lb

## 2015-08-04 DIAGNOSIS — R1310 Dysphagia, unspecified: Secondary | ICD-10-CM

## 2015-08-04 DIAGNOSIS — M47894 Other spondylosis, thoracic region: Secondary | ICD-10-CM | POA: Insufficient documentation

## 2015-08-04 DIAGNOSIS — M8588 Other specified disorders of bone density and structure, other site: Secondary | ICD-10-CM | POA: Diagnosis not present

## 2015-08-04 DIAGNOSIS — M810 Age-related osteoporosis without current pathological fracture: Secondary | ICD-10-CM

## 2015-08-04 MED ORDER — PANTOPRAZOLE SODIUM 40 MG PO TBEC: 40.0000 mg | DELAYED_RELEASE_TABLET | Freq: Every day | ORAL | Status: DC

## 2015-08-04 NOTE — Progress Notes (Signed)
Subjective:    Patient ID: Micheal Frank, male    DOB: 12-29-1925, 82 y.o.   MRN: [id number]  DOS:  08/04/2015 Type of visit - description : Acute, here with his daughter [name] Interval history: He has a long history of difficulty swallowing described as choking, on and off, usually with soft foods -grits- or rice. A month ago symptoms become much more frequent and they were triggered by many other foods. He usually feels like the food gets stuck and frequently have to regurgitate or vomit his food . Symptoms associated with nausea.  He started Fosamax weekly about 3 months ago, the only time he felt he has some problems swallowing Fosamax was last week.  Review of Systems  Denies fever chills, no weight loss. No actual abdominal pain or blood in the stools. Stools were noted to be softer than usual after he started Fosamax. Not taking Motrin or similar medications.  Past Medical History  Diagnosis Date  . Subdural hematoma ([place]) 03/30/11  . Epidural abscess   . Staphylococcus aureus bacteremia with sepsis ([facility] [place])   . Prosthetic joint infection ([place])   . Hyperlipidemia   . History of blood clots   . Blood dyscrasia     pt CAN NOT have any blood thinners d/t hx brain bleed  . S/P right heart catheterization     at least 45yrs  . Asthma   . COPD (chronic obstructive pulmonary disease) ([facility])     emphysema  . Shortness of breath     with exertion  . Pneumonia     as a child and in 1952;had a pneumonia vaccine couple of years ago  . Peripheral neuropathy ([place])   . Short-term memory loss   . Arthritis     left leg also has swelling  . Gastric ulcer   . Constipation     miralax prn  . Depression     takes wellbutrin bid  . Urinary frequency     wears depends  . Complication of anesthesia     confusion with anesthesia  . Sleep apnea     has CPAP but doesn't use it;sleep study done at least 52yrs ago  . Melanoma ([place])   . Chronic kidney disease, stage III (moderate)  01/20/2013    Past Surgical History  Procedure Laterality Date  . Hernia repair      25+yrs ago  . External ear surgery      shunt placed in right ear d/t mennieres   . Total knee arthroplasty      x 2 left knee 2003/2012  . Filtering procedure      filter placed in neck d/t blood  clot  . Eye surgery      bil cataract surgery  . Skin biopsy      melanoma on head   . Knee arthroscopy    . Total knee revision  05/23/2011    Procedure: TOTAL KNEE REVISION;  Surgeon: Meredith Pel;  Location: [facility];  Service: Orthopedics;  Laterality: Left;  LEFT KNEE REMOVAL OF SPACER, POSSIBLE REIMPLANTATION OF REVISION TKA VERSES REPLACEMENT ANTIBIOTIC SPACER  . Epidural abscess drainage  12/2010    Social History   Social History  . Marital Status: Married    Spouse Name: N/A  . Number of Children: 3  . Years of Education: N/A   Occupational History  . retired    Social History Main Topics  . Smoking status: Former Smoker -- 2.00 packs/day for 40  years    Types: Cigarettes    Quit date: 07/17/1978  . Smokeless tobacco: Never Used  . Alcohol Use: No  . Drug Use: No  . Sexual Activity: Not on file   Other Topics Concern  . Not on file   Social History Narrative   Lost wife 06-2013   Lives in his house, 1 daughter and her 2 adult children live w/ him (1g-child w/ special needs)           Medication List       This list is accurate as of: 08/04/15 11:59 PM.  Always use your most recent med list.               albuterol 108 (90 Base) MCG/ACT inhaler  Commonly known as:  PROAIR HFA  Inhale 2 puffs into the lungs every 6 (six) hours as needed for wheezing.     benzonatate 200 MG capsule  Commonly known as:  TESSALON  TAKE ONE CAPSULE 3-4 TIMES DAILY AS NEEDED FOR COUGH     budesonide-formoterol 160-4.5 MCG/ACT inhaler  Commonly known as:  SYMBICORT  Inhale 2 puffs into the lungs 2 (two) times daily.     buPROPion 200 MG 12 hr tablet  Commonly known as:  WELLBUTRIN  SR  Take 1 tablet (200 mg total) by mouth 2 (two) times daily.     calcium carbonate 600 MG Tabs tablet  Commonly known as:  OS-CAL  Take 600 mg by mouth daily.     diazepam 5 MG tablet  Commonly known as:  VALIUM  Take 5 mg by mouth every 6 (six) hours as needed for anxiety. Reported on 08/04/2015     doxazosin 4 MG tablet  Commonly known as:  CARDURA  Take 1 tablet (4 mg total) by mouth at bedtime.     fluticasone 50 MCG/ACT nasal spray  Commonly known as:  FLONASE  Place 2 sprays into the nose 2 (two) times daily.     HYDROcodone-acetaminophen 5-325 MG tablet  Commonly known as:  NORCO/VICODIN  Take 1 tablet by mouth every 6 (six) hours as needed.     HYDROcodone-homatropine 5-1.5 MG/5ML syrup  Commonly known as:  HYCODAN  1/2 tsp by mouth every 12 hours as needed for cough     meclizine 25 MG tablet  Commonly known as:  ANTIVERT  Take 25 mg by mouth daily as needed (vertigo). Reported on 08/04/2015     montelukast 10 MG tablet  Commonly known as:  SINGULAIR  Take 10 mg by mouth at bedtime as needed. Reported on 08/04/2015     multivitamin with minerals Tabs tablet  Take 1 tablet by mouth daily.     pantoprazole 40 MG tablet  Commonly known as:  PROTONIX  Take 1 tablet (40 mg total) by mouth daily.     rOPINIRole 1 MG tablet  Commonly known as:  REQUIP  Take 1 tablet (1 mg total) by mouth at bedtime.     simvastatin 40 MG tablet  Commonly known as:  ZOCOR  Take 1 tablet (40 mg total) by mouth at bedtime.     triamterene-hydrochlorothiazide 37.5-25 MG tablet  Commonly known as:  MAXZIDE-25  Take 0.5 tablets by mouth daily.           Objective:   Physical Exam BP 124/72 mmHg  Pulse 73  Temp(Src) 98.1 F (36.7 C) (Oral)  Ht 5' 9.5" (1.765 m)  Wt 169 lb 8 oz (76.885 kg)  BMI 24.68  kg/m2  SpO2 93% General:   Well developed, well nourished . NAD.  HEENT:  Normocephalic . Face symmetric, atraumatic  neck: Neck and supraclavicular areas free of LADs-  mass Lungs:  Decrease but sounds but clear Normal respiratory effort, no intercostal retractions, no accessory muscle use. Heart: RRR,  signs systolic murmur.  No pretibial edema bilaterally  Skin: Not pale. Not jaundice Neurologic:  alert & oriented X3.  Speech normal, gait appropriate for age and unassisted Psych--  Cognition and judgment appear intact.  Cooperative with normal attention span and concentration.  Behavior appropriate. No anxious or depressed appearing.      Assessment & Plan:   Assessment > COPD Chronic renal insufficiency Hyperlipidemia Depression Decreased memory Neuropathy H/o Mnire's  -- on diuretics  Osteoporosis -- h/o pelvic Fx, dexa April 2016: T score (-) 2.3 DJD   RLS OSA no CPAP H/o melanoma H/o subdural hematoma, epidural abscess, staph aureus  sepsis H/o Prosthetic joint infection H/o Blood clots H/o gastric ulcer Coordinate care with his daughter LYNN  [phone]  Plan: Dysphagia: 82 year old gentleman with COPD complaining of dysphagia, mild and chronic but now worse. 3 months ago started Fosamax, never had a problem with it until a week ago. Plan: Discontinue Fosamax, rx Protonix, GI referral (EGD?  further eval?). CXR Counseling about avoiding choking with small amounts of bites and lots of fluids. Osteoporosis: Started Fosamax 3 months ago however he now has dysphagia and has noted "soft" stools   Plan: dc fosamax, consider other treatments in the future RTC 09-2015 as schedule

## 2015-08-04 NOTE — Patient Instructions (Addendum)
Stop Fosamax  Start pantoprazole (Protonix) one tablet every morning before your breakfast  Call anytime if your symptoms get worse  We are referring to our gastroenterologist .  Burnis Medin see you in March 2017, you already have an appointment

## 2015-08-04 NOTE — Progress Notes (Signed)
Pre visit review using our clinic review tool, if applicable. No additional management support is needed unless otherwise documented below in the visit note. 

## 2015-08-05 ENCOUNTER — Encounter: Payer: Self-pay | Admitting: Physician Assistant

## 2015-08-12 ENCOUNTER — Telehealth: Payer: Self-pay | Admitting: Adult Health

## 2015-08-12 NOTE — Telephone Encounter (Signed)
Spoke with Daughter  She states pt needing more hycodan  His cough is unchanged since sick call on 06/28/15  OV with MW on 1:30 pm 08/16/15  Offered tomorrow but he could not come on this day  Will call sooner if needed

## 2015-08-16 ENCOUNTER — Ambulatory Visit (INDEPENDENT_AMBULATORY_CARE_PROVIDER_SITE_OTHER): Payer: Medicare Other | Admitting: Internal Medicine

## 2015-08-16 ENCOUNTER — Encounter: Payer: Self-pay | Admitting: Internal Medicine

## 2015-08-16 ENCOUNTER — Telehealth: Payer: Self-pay | Admitting: Internal Medicine

## 2015-08-16 VITALS — BP 140/70 | HR 75 | Ht 69.0 in | Wt 167.8 lb

## 2015-08-16 DIAGNOSIS — R05 Cough: Secondary | ICD-10-CM | POA: Diagnosis not present

## 2015-08-16 DIAGNOSIS — R053 Chronic cough: Secondary | ICD-10-CM

## 2015-08-16 DIAGNOSIS — J449 Chronic obstructive pulmonary disease, unspecified: Secondary | ICD-10-CM

## 2015-08-16 MED ORDER — PANTOPRAZOLE SODIUM 40 MG PO TBEC: 40.0000 mg | DELAYED_RELEASE_TABLET | Freq: Two times a day (BID) | ORAL | Status: DC

## 2015-08-16 MED ORDER — BENZONATATE 200 MG PO CAPS: ORAL_CAPSULE | ORAL | Status: DC

## 2015-08-16 NOTE — Patient Instructions (Addendum)
Protonix 40 mg Take 30- 60 min before your first and last meals of the day and try leaving off the pm symbicort  Consider taking the tessilon before you start coughing/ delsym may also help   GERD (REFLUX)  is an extremely common cause of respiratory symptoms just like yours , many times with no obvious heartburn at all.    It can be treated with medication, but also with lifestyle changes including elevation of the head of your bed (ideally with 6 inch  bed blocks),  Smoking cessation, avoidance of late meals, excessive alcohol, and avoid fatty foods, chocolate, peppermint, colas, red wine, and acidic juices such as orange juice.  NO MINT OR MENTHOL PRODUCTS SO NO COUGH DROPS  USE SUGARLESS CANDY INSTEAD (Jolley ranchers or Stover's or Life Savers) or even ice chips will also do - the key is to swallow to prevent all throat clearing. NO OIL BASED VITAMINS - use powdered substitutes.   Please schedule a follow up office visit in 4 weeks, sooner if needed   - When return bring your medications in 2 separate bags, the ones you take no matter(automatically)  what vs the as needed (only when you feel you need them)  Late add spirometry on return

## 2015-08-16 NOTE — Telephone Encounter (Signed)
Pt has had no choking or swallowing issues since he started protonix. Pt would like to know if ok to cancel GI appt scheduled for 2/1 or if Dr. Larose Kells recommends for him to go?

## 2015-08-16 NOTE — Progress Notes (Signed)
Subjective:    Patient ID: Micheal Frank, male    DOB: 17-Oct-1925 .   MRN: [id number]   Brief patient profile:  82 yo male quit smoking 1980 prev followed by Dr Delford Field  with dx COPD    History of Present Illness  04/15/2015 Follow up : COPD  Pt returns for 6 month follow up for COPD.   Remains on Symbicort Twice daily .  Over all he says he is doing okay, no flare of cough or dyspnea. Gets winded with stairs.  Says he is active around the house. Uses a cane.  Flu shot utd. PVX and Prevnar utd. TDAP utd.  rec No change rx  08/16/2015 acute extended ov/Gwendlyn Hanback re: establish care/ eval chronic cough maint on symbicort 160 2bid ? Prev resp to singulair reported but takes as needed Chief Complaint  Patient presents with  . Acute Visit    Pt states he continues to have cough since the last visit- worse in the evenings. Cough is non prod.   cough is worse p supper mostly dry / does not tpically wake him up or flare in am   Extremely confused with details of care esp maint vs prns  Doe = MMRC2 = can't walk a nl pace on a flat grade s sob but does fine slow pace if avoids inclines/ steps    No obvious day to day or daytime variability or assoc excess/ purulent sputum or mucus plugs or hemoptysis or cp or chest tightness, subjective wheeze or overt sinus or hb symptoms. No unusual exp hx or h/o childhood pna/ asthma or knowledge of premature birth.  Sleeping ok without nocturnal  or early am exacerbation  of respiratory  c/o's or need for noct saba. Also denies any obvious fluctuation of symptoms with weather or environmental changes or other aggravating or alleviating factors except as outlined above   Current Medications, Allergies, Complete Past Medical History, Past Surgical History, Family History, and Social History were reviewed in [employer] record.  ROS  The following are not active complaints unless bolded sore throat, dysphagia, dental problems, itching,  sneezing,  nasal congestion or excess/ purulent secretions, ear ache,   fever, chills, sweats, unintended wt loss, classically pleuritic or exertional cp,  orthopnea pnd or leg swelling, presyncope, palpitations, abdominal pain, anorexia, nausea, vomiting, diarrhea  or change in bowel or bladder habits, change in stools or urine, dysuria,hematuria,  rash, arthralgias, visual complaints, headache, numbness, weakness or ataxia or problems with walking or coordination,  change in mood/affect or memory.                        Objective:   Physical Exam  GEN: A/Ox3; pleasant , NAD, frail and elderly , walks with cane    amb hoarse wm nad  Wt Readings from Last 3 Encounters:  08/16/15 167 lb 12.8 oz (76.114 kg)  08/04/15 169 lb 8 oz (76.885 kg)  04/15/15 162 lb (73.483 kg)    Vital signs reviewed   HEENT: nl  turbinates, and oropharynx. Nl external ear canals without cough reflex - full dentures    NECK :  without JVD/Nodes/TM/ nl carotid upstrokes bilaterally   LUNGS: no acc muscle use,  slt barrel chest, min insp and exp rhonchi mostly upper airway    CV:  RRR  no s3 or murmur or increase in P2, no edema   ABD:  soft and nontender with nl inspiratory excursion in  the supine position. No bruits or organomegaly, bowel sounds nl  MS:  Nl gait/ ext warm without deformities, calf tenderness, cyanosis or clubbing No obvious joint restrictions   SKIN: warm and dry without lesions    NEURO:  alert, approp, nl sensorium with  no motor deficits     I personally reviewed images and agree with radiology impression as follows:  CXR:  08/04/15  1. Bilateral pleural parenchymal scarring. No acute new infiltrate noted. 2. Diffuse thoracic spine osteopenia degenerative change. Ankylosing spondylitis cannot be excluded.      Assessment & Plan:

## 2015-08-17 NOTE — Telephone Encounter (Signed)
Please advise 

## 2015-08-17 NOTE — Telephone Encounter (Signed)
[place], Pt's daughter, of recommendations.

## 2015-08-17 NOTE — Telephone Encounter (Signed)
If he is doing better, please call GI and cancel the appointment. Continue Protonix, let me know if symptoms resurface.

## 2015-08-18 ENCOUNTER — Ambulatory Visit: Payer: Medicare Other | Admitting: Physician Assistant

## 2015-08-18 NOTE — Assessment & Plan Note (Signed)
Many of his symptoms are c/w  Classic Upper airway cough syndrome, so named because it's frequently impossible to sort out how much is  CR/sinusitis with freq throat clearing (which can be related to primary GERD)   vs  causing  secondary (" extra esophageal")  GERD from wide swings in gastric pressure that occur with throat clearing, often  promoting self use of mint and menthol lozenges that reduce the lower esophageal sphincter tone and exacerbate the problem further in a cyclical fashion.   These are the same pts (now being labeled as having "irritable larynx syndrome" by some cough centers) who not infrequently have a history of having failed to tolerate ace inhibitors,  dry powder inhalers or biphosphonates or report having atypical reflux symptoms that don't respond to standard doses of PPI , and are easily confused as having aecopd or asthma flares by even experienced allergists/ pulmonologists.   For now try tessilon and reduce ICS/ max rx for gerd and f/u in 4 weeks

## 2015-08-18 NOTE — Assessment & Plan Note (Addendum)
Symptoms of cough remain difficult to control. DDX of  difficult airways management almost all start with A and  include Adherence, Ace Inhibitors, Acid Reflux, Active Sinus Disease, Alpha 1 Antitripsin deficiency, Anxiety masquerading as Airways dz,  ABPA,  Allergy(esp in young), Aspiration (esp in elderly), Adverse effects of meds,  Active smokers, A bunch of PE's (a small clot burden can't cause this syndrome unless there is already severe underlying pulm or vascular dz with poor reserve) plus two Bs  = Bronchiectasis and Beta blocker use..and one C= CHF   Adherence is always the initial "prime suspect" and is a multilayered concern that requires a "trust but verify" approach in every patient - starting with knowing how to use medications, especially inhalers, correctly, keeping up with refills and understanding the fundamental difference between maintenance and prns vs those medications only taken for a very short course and then stopped and not refilled.  - - The proper method of use, as well as anticipated side effects, of a metered-dose inhaler are discussed and demonstrated to the patient. Improved effectiveness after extensive coaching during this visit to a level of approximately 75 % from a baseline of 50 % - really struggling with recs esp maint vs prns  ? Acid (or non-acid) GERD > always difficult to exclude as up to 75% of pts in some series report no assoc GI/ Heartburn symptoms> rec max (24h)  acid suppression and diet restrictions/ reviewed and instructions given in writing.   ? Asp > may need Dg Es next  ? Allergy > says better on singulair but should take daily  And try off the pm symbicort as it may actually be aggravating his main symptom= cough   I had an extended discussion with the patient reviewing all relevant studies completed to date and  lasting 25 minutes of a 40 minute acute transition of care office visit    Each maintenance medication was reviewed in detail including  most importantly the difference between maintenance and prns and under what circumstances the prns are to be triggered using an action plan format that is not reflected in the computer generated alphabetically organized AVS.    Please see instructions for details which were reviewed in writing and the patient given a copy highlighting the part that I personally wrote and discussed at today's ov.

## 2015-08-23 ENCOUNTER — Telehealth: Payer: Self-pay | Admitting: Internal Medicine

## 2015-08-23 MED ORDER — PANTOPRAZOLE SODIUM 40 MG PO TBEC: 40.0000 mg | DELAYED_RELEASE_TABLET | Freq: Two times a day (BID) | ORAL | Status: DC

## 2015-08-23 NOTE — Telephone Encounter (Signed)
Spoke with [employer]. Pt needs a refill on pantoprazole. This has been sent in. Nothing further was needed.

## 2015-09-13 ENCOUNTER — Ambulatory Visit: Payer: Medicare Other | Admitting: Internal Medicine

## 2015-09-13 ENCOUNTER — Encounter: Payer: Self-pay | Admitting: Internal Medicine

## 2015-09-13 ENCOUNTER — Ambulatory Visit (INDEPENDENT_AMBULATORY_CARE_PROVIDER_SITE_OTHER): Payer: Medicare Other | Admitting: Internal Medicine

## 2015-09-13 VITALS — BP 126/80 | HR 70 | Ht 69.5 in | Wt 170.0 lb

## 2015-09-13 DIAGNOSIS — R053 Chronic cough: Secondary | ICD-10-CM

## 2015-09-13 DIAGNOSIS — J449 Chronic obstructive pulmonary disease, unspecified: Secondary | ICD-10-CM | POA: Diagnosis not present

## 2015-09-13 DIAGNOSIS — R05 Cough: Secondary | ICD-10-CM

## 2015-09-13 NOTE — Assessment & Plan Note (Signed)
Better with gerd rx c/w  Classic Upper airway cough syndrome, so named because it's frequently impossible to sort out how much is  CR/sinusitis with freq throat clearing (which can be related to primary GERD)   vs  causing  secondary (" extra esophageal")  GERD from wide swings in gastric pressure that occur with throat clearing, often  promoting self use of mint and menthol lozenges that reduce the lower esophageal sphincter tone and exacerbate the problem further in a cyclical fashion.   These are the same pts (now being labeled as having "irritable larynx syndrome" by some cough centers) who not infrequently have a history of having failed to tolerate ace inhibitors,  dry powder inhalers or biphosphonates or report having atypical reflux symptoms that don't respond to standard doses of PPI , and are easily confused as having aecopd or asthma flares by even experienced allergists/ pulmonologists.   For now continue high dose ppi > f/u Dr Larose Kells re taper or refer to GI if symptoms of cough flare esp is assoc with dysphagia which has now resolved.

## 2015-09-13 NOTE — Progress Notes (Signed)
Subjective:    Patient ID: Micheal Frank, male    DOB: 06-21-1926 .   MRN: [id number]   Brief patient profile:  82 yo male quit smoking 1980 prev followed by Dr Joya Gaskins  with dx COPD    History of Present Illness  04/15/2015 Follow up : COPD   Remains on Symbicort Twice daily .  Over all he says he is doing okay, no flare of cough or dyspnea. Gets winded with stairs.  Says he is active around the house. Uses a cane.  Flu shot utd. PVX and Prevnar utd. TDAP utd.  rec No change rx  08/16/2015 acute extended ov/Wert re: establish care/ eval chronic cough maint on symbicort 160 2bid ? Prev resp to singulair reported but takes as needed Chief Complaint  Patient presents with  . Acute Visit    Pt states he continues to have cough since the last visit- worse in the evenings. Cough is non prod.   cough is worse p supper mostly dry / does not typically wake him up or flare in am   Extremely confused with details of care esp maint vs prns  Doe = MMRC2 = can't walk a nl pace on a flat grade s sob but does fine slow pace if avoids inclines/ steps rec Protonix 40 mg Take 30- 60 min before your first and last meals of the day and try leaving off the pm symbicort Consider taking the tessilon before you start coughing/ delsym may also help  GERD diet    09/13/2015  f/u ov/Wert re: GOLD II copd symb 160/singulair maint rx  Chief Complaint  Patient presents with  . Follow-up    Cough has improved since last visit. He states he is not choking on food anymore. No new co's today.   much better cough on max gerd rx and only taking symbicort 160 2 each am/ no need for hfa   Really Not limited by breathing from desired activities  But very sedentary    No obvious day to day or daytime variability or assoc excess/ purulent sputum or mucus plugs or hemoptysis or cp or chest tightness, subjective wheeze or overt sinus or hb symptoms. No unusual exp hx or h/o childhood pna/ asthma or knowledge of  premature birth.  Sleeping ok without nocturnal  or early am exacerbation  of respiratory  c/o's or need for noct saba. Also denies any obvious fluctuation of symptoms with weather or environmental changes or other aggravating or alleviating factors except as outlined above   Current Medications, Allergies, Complete Past Medical History, Past Surgical History, Family History, and Social History were reviewed in [employer] record.  ROS  The following are not active complaints unless bolded sore throat, dysphagia, dental problems, itching, sneezing,  nasal congestion or excess/ purulent secretions, ear ache,   fever, chills, sweats, unintended wt loss, classically pleuritic or exertional cp,  orthopnea pnd or leg swelling, presyncope, palpitations, abdominal pain, anorexia, nausea, vomiting, diarrhea  or change in bowel or bladder habits, change in stools or urine, dysuria,hematuria,  rash, arthralgias, visual complaints, headache, numbness, weakness or ataxia or problems with walking or coordination,  change in mood/affect or memory.              Objective:   Physical Exam  GEN: A/Ox3; pleasant , NAD, frail and elderly , walks with cane    amb slt hoarse wm nad   09/13/2015       170  08/16/15 167 lb 12.8 oz (76.114 kg)  08/04/15 169 lb 8 oz (76.885 kg)  04/15/15 162 lb (73.483 kg)    Vital signs reviewed   HEENT: nl  turbinates, and oropharynx. Nl external ear canals without cough reflex - full dentures    NECK :  without JVD/Nodes/TM/ nl carotid upstrokes bilaterally   LUNGS: no acc muscle use,  slt barrel chest, min insp and exp rhonchi mostly upper airway    CV:  RRR  no s3 or murmur or increase in P2, no edema   ABD:  soft and nontender with nl inspiratory excursion in the supine position. No bruits or organomegaly, bowel sounds nl  MS:  Nl gait/ ext warm without deformities, calf tenderness, cyanosis or clubbing No obvious joint restrictions    SKIN: warm and dry without lesions    NEURO:  alert, approp, nl sensorium with  no motor deficits     I personally reviewed images and agree with radiology impression as follows:  CXR:  08/04/15  1. Bilateral pleural parenchymal scarring. No acute new infiltrate noted. 2. Diffuse thoracic spine osteopenia degenerative change. Ankylosing spondylitis cannot be excluded.      Assessment & Plan:

## 2015-09-13 NOTE — Assessment & Plan Note (Signed)
08/16/2015  extensive coaching HFA effectiveness =    75%   -  Spirometry 09/13/2015  FEV1 1.60 (54%)  Ratio 60   I had an extended final summary discussion with the patient and daughter reviewing all relevant studies completed to date and  lasting 15 to 20 minutes of a 25 minute visit on the following issues:  He only has moderate dz on a stable regimen of just symbicort 160 2 puff each am and has the opportunity to increase to 2 pff every 12 hours  Reviewed contingency rx with saba  Each maintenance medication was reviewed in detail including most importantly the difference between maintenance and as needed and under what circumstances the prns are to be used.  Please see instructions for details which were reviewed in writing and the patient given a copy.    Pulmonary f/u can be prn

## 2015-09-13 NOTE — Patient Instructions (Signed)
Continue the symbicort 160 2 puffs each am and add a second dose of 2 pffs 12 hours later if not doing well   Only use your albuterol as a rescue medication to be used if you can't catch your breath by resting or doing a relaxed purse lip breathing pattern.  - The less you use it, the better it will work when you need it. - Ok to use up to 2 puffs  every 4 hours if you must but call for immediate appointment if use goes up over your usual need - Don't leave home without it !!  (think of it like the spare tire for your car)   If you are satisfied with your treatment plan,  let your doctor know and he/she can either refill your medications or you can return here when your prescription runs out.     If in any way you are not 100% satisfied,  please tell us.  If 100% better, tell your friends!  Pulmonary follow up is as needed

## 2015-09-20 ENCOUNTER — Other Ambulatory Visit: Payer: Self-pay | Admitting: *Deleted

## 2015-09-20 MED ORDER — ROPINIROLE HCL 1 MG PO TABS: 1.0000 mg | ORAL_TABLET | Freq: Every day | ORAL | Status: DC

## 2015-09-20 NOTE — Progress Notes (Signed)
Refill sent per [facility] refill protocol/SLS  

## 2015-10-06 ENCOUNTER — Encounter: Payer: Self-pay | Admitting: Internal Medicine

## 2015-10-06 ENCOUNTER — Telehealth: Payer: Self-pay

## 2015-10-06 ENCOUNTER — Ambulatory Visit (INDEPENDENT_AMBULATORY_CARE_PROVIDER_SITE_OTHER): Payer: Medicare Other | Admitting: Internal Medicine

## 2015-10-06 VITALS — BP 126/60 | HR 75 | Temp 97.6°F | Ht 70.0 in | Wt 165.4 lb

## 2015-10-06 DIAGNOSIS — J449 Chronic obstructive pulmonary disease, unspecified: Secondary | ICD-10-CM | POA: Diagnosis not present

## 2015-10-06 DIAGNOSIS — R269 Unspecified abnormalities of gait and mobility: Secondary | ICD-10-CM | POA: Diagnosis not present

## 2015-10-06 DIAGNOSIS — R197 Diarrhea, unspecified: Secondary | ICD-10-CM

## 2015-10-06 DIAGNOSIS — N183 Chronic kidney disease, stage 3 unspecified: Secondary | ICD-10-CM

## 2015-10-06 DIAGNOSIS — M810 Age-related osteoporosis without current pathological fracture: Secondary | ICD-10-CM | POA: Diagnosis not present

## 2015-10-06 DIAGNOSIS — Z09 Encounter for follow-up examination after completed treatment for conditions other than malignant neoplasm: Secondary | ICD-10-CM

## 2015-10-06 LAB — BASIC METABOLIC PANEL
BUN: 24 mg/dL — AB (ref 6–23)
CHLORIDE: 103 meq/L (ref 96–112)
CO2: 29 mEq/L (ref 19–32)
CREATININE: 1.69 mg/dL — AB (ref 0.40–1.50)
Calcium: 9.2 mg/dL (ref 8.4–10.5)
GFR: 40.89 mL/min — ABNORMAL LOW (ref >60.00)
GLUCOSE: 79 mg/dL (ref 70–99)
POTASSIUM: 3.7 meq/L (ref 3.5–5.1)
Sodium: 140 mEq/L (ref 135–145)

## 2015-10-06 NOTE — Progress Notes (Signed)
Subjective:    Patient ID: Micheal Frank, male    DOB: [birth year], 82 y.o.   MRN: [id number]  DOS:  10/06/2015 Type of visit - description : Follow-up, here with his daughter Interval history: Has several concerns, since he started taking Protonix, he notices his stool frequency has increased, stools are soft and sometimes watery. Would like to do physical therapy to strengthen his legs and get some balance back. Wonders if I could refill his COPD medications.   Review of Systems Denies nausea, vomiting, blood in the stools or abdominal pain.   Past Medical History  Diagnosis Date  . Subdural hematoma ([place]) 03/30/11  . Epidural abscess   . Staphylococcus aureus bacteremia with sepsis ([place])   . Prosthetic joint infection ([place])   . Hyperlipidemia   . History of blood clots   . Blood dyscrasia     pt CAN NOT have any blood thinners d/t hx brain bleed  . S/P right heart catheterization     at least 7yrs  . Asthma   . COPD (chronic obstructive pulmonary disease) ([facility])     emphysema  . Shortness of breath     with exertion  . Pneumonia     as a child and in 1952;had a pneumonia vaccine couple of years ago  . Peripheral neuropathy ([place])   . Short-term memory loss   . Arthritis     left leg also has swelling  . Gastric ulcer   . Constipation     miralax prn  . Depression     takes wellbutrin bid  . Urinary frequency     wears depends  . Complication of anesthesia     confusion with anesthesia  . Sleep apnea     has CPAP but doesn't use it;sleep study done at least 29yrs ago  . Melanoma ([place])   . Chronic kidney disease, stage III (moderate) 01/20/2013    Past Surgical History  Procedure Laterality Date  . Hernia repair      25+yrs ago  . External ear surgery      shunt placed in right ear d/t mennieres   . Total knee arthroplasty      x 2 left knee 2003/2012  . Filtering procedure      filter placed in neck d/t blood  clot  . Eye surgery      bil cataract  surgery  . Skin biopsy      melanoma on head   . Knee arthroscopy    . Total knee revision  05/23/2011    Procedure: TOTAL KNEE REVISION;  Surgeon: Meredith Pel;  Location: [facility];  Service: Orthopedics;  Laterality: Left;  LEFT KNEE REMOVAL OF SPACER, POSSIBLE REIMPLANTATION OF REVISION TKA VERSES REPLACEMENT ANTIBIOTIC SPACER  . Epidural abscess drainage  12/2010    Social History   Social History  . Marital Status: Married    Spouse Name: N/A  . Number of Children: 3  . Years of Education: N/A   Occupational History  . retired    Social History Main Topics  . Smoking status: Former Smoker -- 2.00 packs/day for 40 years    Types: Cigarettes    Quit date: 07/17/1978  . Smokeless tobacco: Never Used  . Alcohol Use: No  . Drug Use: No  . Sexual Activity: Not on file   Other Topics Concern  . Not on file   Social History Narrative   Lost wife 06-2013   Lives in his house,  1 daughter and her 2 adult children live w/ him (1g-child w/ special needs)           Medication List       This list is accurate as of: 10/06/15 11:59 PM.  Always use your most recent med list.               albuterol 108 (90 Base) MCG/ACT inhaler  Commonly known as:  PROAIR HFA  Inhale 2 puffs into the lungs every 6 (six) hours as needed for wheezing.     benzonatate 200 MG capsule  Commonly known as:  TESSALON  TAKE ONE CAPSULE 3-4 TIMES DAILY AS NEEDED FOR COUGH     budesonide-formoterol 160-4.5 MCG/ACT inhaler  Commonly known as:  SYMBICORT  Inhale 2 puffs into the lungs 2 (two) times daily.     buPROPion 200 MG 12 hr tablet  Commonly known as:  WELLBUTRIN SR  Take 1 tablet (200 mg total) by mouth 2 (two) times daily.     calcium carbonate 600 MG Tabs tablet  Commonly known as:  OS-CAL  Take 600 mg by mouth daily.     diazepam 5 MG tablet  Commonly known as:  VALIUM  Take 5 mg by mouth every 6 (six) hours as needed for anxiety. Reported on 08/04/2015     doxazosin 4 MG  tablet  Commonly known as:  CARDURA  Take 1 tablet (4 mg total) by mouth at bedtime.     fluticasone 50 MCG/ACT nasal spray  Commonly known as:  FLONASE  Place 2 sprays into the nose 2 (two) times daily.     HYDROcodone-acetaminophen 5-325 MG tablet  Commonly known as:  NORCO/VICODIN  Take 1 tablet by mouth every 6 (six) hours as needed.     meclizine 25 MG tablet  Commonly known as:  ANTIVERT  Take 25 mg by mouth daily as needed (vertigo). Reported on 08/04/2015     montelukast 10 MG tablet  Commonly known as:  SINGULAIR  Take 10 mg by mouth daily. Reported on 08/04/2015     multivitamin with minerals Tabs tablet  Take 1 tablet by mouth daily.     rOPINIRole 1 MG tablet  Commonly known as:  REQUIP  Take 1 tablet (1 mg total) by mouth at bedtime.     simvastatin 40 MG tablet  Commonly known as:  ZOCOR  Take 1 tablet (40 mg total) by mouth at bedtime.           Objective:   Physical Exam BP 126/60 mmHg  Pulse 75  Temp(Src) 97.6 F (36.4 C) (Oral)  Ht 5\' 10"  (1.778 m)  Wt 165 lb 6 oz (75.014 kg)  BMI 23.73 kg/m2  SpO2 96% General:   Well developed, well nourished . NAD.  HEENT:  Normocephalic . Face symmetric, atraumatic. HOH Lungs:  CTA B Normal respiratory effort, no intercostal retractions, no accessory muscle use. Heart: RRR,  no murmur.  no pretibial edema bilaterally  Abdomen:  Not distended, soft, non-tender. No rebound or rigidity.  Skin: Not pale. Not jaundice Neurologic:  alert & oriented X3.  Speech normal, gait assisted, slow, appropriate for age. Psych--  Cognition and judgment appear intact.  Cooperative with normal attention span and concentration.  Behavior appropriate. No anxious or depressed appearing.    Assessment & Plan:   Assessment > COPD- Dr Melvyn Novas 08-2015, stable, RTC PRN CKD -----Creatinine 1.67, GFR 32 Hyperlipidemia Depression Decreased memory Neuropathy H/o Mnire's  -- on diuretics  Osteoporosis -- h/o pelvic Fx, dexa  April 2016: T score (-) 2.3; prolia-- not covered, rx fosamax 03-2015, dc after 3 months d/t dysphagia DJD  -- hydrocodone rarely RLS OSA no CPAP H/o melanoma H/o subdural hematoma, epidural abscess, staph aureus  sepsis H/o Prosthetic joint infection H/o Blood clots H/o gastric ulcer Coordinate care with his daughter [name]  [phone]  Plan: Dysphagia: Resolved quickly after he stopped Fosamax and starteda Protonix, declined GI eval. Currently back to baseline. He is having loose stools since he started Protonix. Plan:  dc Protonix. Call if loose stools are not corrected. Osteoporosis: Intolerant to Fosamax, GFR 32, not candidate for Reclast. We'll try Prolia again Gait disorder: Would like to strengthen his legs, refer to [facility] physical therapy. COPD : Stable, will see Dr. Melvyn Novas when necessary. I will refill his breathing related medications as needed including Tessalon Perles. CKD: Check a BMP RTC 4-6 months

## 2015-10-06 NOTE — Patient Instructions (Signed)
GO TO THE LAB :      Get the blood work     GO TO THE FRONT DESK Schedule your next appointment for a  Routine check up When?   4 to 6 months Fasting?  No  Stop Protonix, call me if the stools don't go back to normal

## 2015-10-06 NOTE — Progress Notes (Signed)
Pre visit review using our clinic review tool, if applicable. No additional management support is needed unless otherwise documented below in the visit note. 

## 2015-10-06 NOTE — Telephone Encounter (Signed)
Insurance verification initiated via [employer] portal. Awaiting determination. Insurance verification request form sent for scanning.

## 2015-10-07 NOTE — Assessment & Plan Note (Signed)
  Dysphagia: Resolved quickly after he stopped Fosamax and starteda Protonix, declined GI eval. Currently back to baseline. He is having loose stools since he started Protonix. Plan:  dc Protonix. Call if loose stools are not corrected. Osteoporosis: Intolerant to Fosamax, GFR 32, not candidate for Reclast. We'll try Prolia again Gait disorder: Would like to strengthen his legs, refer to [facility] physical therapy. COPD : Stable, will see Dr. Melvyn Novas when necessary. I will refill his breathing related medications as needed including Tessalon Perles. CKD: Check a BMP RTC 4-6 months

## 2015-10-07 NOTE — Telephone Encounter (Signed)
Spoke w/ billing, an estimated out of pocket for Pt after insurance is $195. Financial assistance can be called at [phone], option 1.

## 2015-10-07 NOTE — Telephone Encounter (Signed)
Received Summary of Benefits for Pt. Admin and Prolia are subject to 20% co-insurance up to a $3500 out of pocket max ($212 met). If an office visit is billed, a $25 co-pay will apply. Once met, coverage increases to 100 % contracted rate. No deductible applies. Copays do contribute to the OOP max. Prior authorization is not needed. Will try to contact billing to see what estimated out of pocket would be for him.

## 2015-10-11 NOTE — Telephone Encounter (Signed)
Advise pt about the cost of prolia, proceed if pt agreeable

## 2015-10-14 ENCOUNTER — Telehealth: Payer: Self-pay | Admitting: Internal Medicine

## 2015-10-14 DIAGNOSIS — R197 Diarrhea, unspecified: Secondary | ICD-10-CM

## 2015-10-14 NOTE — Telephone Encounter (Signed)
LMOM informing Pt to return call regarding Prolia.

## 2015-10-14 NOTE — Telephone Encounter (Signed)
Protonix was DC hoping that that was the cause of  Diarrhea. If he is not better recommend the following: Stool collection for C. difficile, WBCs, culture, Hemoccult. DX diarrhea 1 probiotic OTC daily Okay Imodium 2 mg once daily as needed.

## 2015-10-14 NOTE — Telephone Encounter (Signed)
Pt's daughter [name] is requesting a call back from [place] directly because she says that her dad is still experiencing loose stools. She says that pcp took him off of a medication to prevent the loose stool.    CB: [phone]

## 2015-10-14 NOTE — Telephone Encounter (Signed)
Tried calling Pt's daughter, [name], unable to leave voice message, mailbox was full. Will try again later.

## 2015-10-14 NOTE — Telephone Encounter (Signed)
Please advise 

## 2015-10-14 NOTE — Telephone Encounter (Signed)
Spoke w/ [name], informed me that she was unsure if Pt's diet is playing a role in stool changes, states that Pt eats junk food and soda's and orange juice throughout the day. Informed her I wasn't sure if that could be playing a role or not. Informed her of Dr. Larose Kells recommendations, she will come by Monday to pick up the containers (leaving out of town this weekend on business), and will start Pt on an OTC probiotic daily and use Imodium PRN. She will also let us know if Pt becomes worse. [name] thanked me for calling and for Dr. Ethel Rana help.

## 2015-10-18 ENCOUNTER — Ambulatory Visit: Payer: Medicare Other | Admitting: Physical Therapy

## 2015-10-19 ENCOUNTER — Other Ambulatory Visit: Payer: Self-pay | Admitting: Internal Medicine

## 2015-10-19 NOTE — Telephone Encounter (Signed)
MyChart message sent to Pt.

## 2015-10-19 NOTE — Addendum Note (Signed)
Addended by: Peggyann Shoals on: 10/19/2015 12:27 PM   Modules accepted: Orders

## 2015-10-20 LAB — C. DIFFICILE GDH AND TOXIN A/B
C. DIFFICILE GDH: DETECTED — AB
C. difficile Toxin A/B: NOT DETECTED

## 2015-10-20 LAB — FECAL LACTOFERRIN, QUANT: LACTOFERRIN: POSITIVE

## 2015-10-21 ENCOUNTER — Other Ambulatory Visit: Payer: Self-pay | Admitting: Emergency Medicine

## 2015-10-21 LAB — CLOSTRIDIUM DIFFICILE BY PCR: CDIFFPCR: DETECTED — AB

## 2015-10-21 MED ORDER — METRONIDAZOLE 500 MG PO TABS: 500.0000 mg | ORAL_TABLET | Freq: Three times a day (TID) | ORAL | Status: DC

## 2015-10-21 NOTE — Telephone Encounter (Signed)
Rx sent 

## 2015-10-21 NOTE — Telephone Encounter (Signed)
[employer] called Positive C Diff results on this at 1140...Marland KitchenKMP

## 2015-10-21 NOTE — Telephone Encounter (Signed)
C. difficile positive, spoke with the patient's daughter, pt still having diarrhea about the same. Plan: Flagyl, 3 times a day for 10 days. They will call me if he is not gradually improving in the next couple of weeks. Prescription is ready to be sent to [facility] at [facility]

## 2015-10-23 LAB — STOOL CULTURE

## 2015-10-25 ENCOUNTER — Other Ambulatory Visit: Payer: Medicare Other

## 2015-10-26 ENCOUNTER — Other Ambulatory Visit: Payer: Medicare Other

## 2015-10-28 ENCOUNTER — Telehealth: Payer: Self-pay | Admitting: Emergency Medicine

## 2015-10-28 ENCOUNTER — Other Ambulatory Visit: Payer: Medicare Other

## 2015-10-28 ENCOUNTER — Other Ambulatory Visit: Payer: Self-pay | Admitting: Emergency Medicine

## 2015-10-28 NOTE — Telephone Encounter (Signed)
Blanch Media from [facility] needs an IFOB for this pt and there is no dx code....KMP

## 2015-10-28 NOTE — Telephone Encounter (Signed)
Did you order IFOB?

## 2015-10-31 NOTE — Telephone Encounter (Addendum)
A hemocult was Rx but he had C diff and was treated, no need for IFOB at this time

## 2015-11-01 NOTE — Telephone Encounter (Signed)
Noted  

## 2015-11-02 ENCOUNTER — Other Ambulatory Visit: Payer: Medicare Other

## 2015-11-02 NOTE — Telephone Encounter (Signed)
Received call from [employer] at [facility] lab requesting a order to be placed for a IFOB for the patient.  Called back and spoke with Lynnett and informed her of Dr. Ethel Rana note below.  She verbalized understanding and stated that she will take it out.//AB/CMA

## 2015-11-05 ENCOUNTER — Telehealth: Payer: Self-pay | Admitting: Internal Medicine

## 2015-11-05 MED ORDER — METRONIDAZOLE 500 MG PO TABS: 500.0000 mg | ORAL_TABLET | Freq: Three times a day (TID) | ORAL | Status: DC

## 2015-11-05 NOTE — Telephone Encounter (Signed)
Spoke w/ [name], recommendations given. Instructed her to let us know if Pt is not improving, Sx's return, if he begins having fever, chills, N/V. [name] verbalized understanding. Flagyl sent to [facility].

## 2015-11-05 NOTE — Telephone Encounter (Signed)
Caller name:Cutler,Lynn Relation to pt: daughter  Call back [place]: [id number] - [place], [address] AT [address]  Reason for call:  Patient dropped off a stool sample 10/19/15 and metroNIDAZOLE (FLAGYL) 500 MG tablet was prescribed daughter states symptoms were improving and now stools are soft again. Please advise daughter directly.

## 2015-11-05 NOTE — Telephone Encounter (Signed)
Please advise 

## 2015-11-05 NOTE — Telephone Encounter (Signed)
Recommend to start a second round of Flagyl, same dose for the same amount of days. If he does not improve or if the symptoms resurface: Let us know, we'll need a GI referral

## 2015-11-19 ENCOUNTER — Ambulatory Visit (INDEPENDENT_AMBULATORY_CARE_PROVIDER_SITE_OTHER): Payer: Medicare Other | Admitting: Internal Medicine

## 2015-11-19 ENCOUNTER — Encounter: Payer: Self-pay | Admitting: Internal Medicine

## 2015-11-19 VITALS — BP 124/60 | HR 69 | Temp 97.8°F | Ht 70.0 in | Wt 166.0 lb

## 2015-11-19 DIAGNOSIS — R300 Dysuria: Secondary | ICD-10-CM

## 2015-11-19 DIAGNOSIS — Z09 Encounter for follow-up examination after completed treatment for conditions other than malignant neoplasm: Secondary | ICD-10-CM

## 2015-11-19 DIAGNOSIS — A047 Enterocolitis due to Clostridium difficile: Secondary | ICD-10-CM | POA: Diagnosis not present

## 2015-11-19 DIAGNOSIS — J438 Other emphysema: Secondary | ICD-10-CM | POA: Diagnosis not present

## 2015-11-19 DIAGNOSIS — A0472 Enterocolitis due to Clostridium difficile, not specified as recurrent: Secondary | ICD-10-CM

## 2015-11-19 NOTE — Patient Instructions (Addendum)
For cough: Mucinex DM as needed Tessalon Perles as needed Flonase 2 sprays on the side of the nose daily  For diarrhea: That seems to be going better, take a probiotic daily (ALIGN) , if symptoms resurface let me know for a GI referral  Please bring the urine sample as soon as possible for a urine culture.  During the weekend if you have severe symptoms, fever, chills, please go to urgent care or ER.

## 2015-11-19 NOTE — Progress Notes (Signed)
Subjective:    Patient ID: Micheal Frank, male    DOB: 04-02-1926, 82 y.o.   MRN: [id number]  DOS:  11/19/2015 Type of visit - description : Acute, several concerns Interval history: 3 or 4 weeks history of cough, sometimes intense, although this week symptoms have decreased, he feels that he has a lot of mucus pooling in the throat. No sputum production per se, feels that the mucus he coughs is from the throat.  Today he developed mild dysuria and some urinary frequency. Apparently a home test for UTI was positive.  Diagnosed with C. difficile, status post 2 rounds of Flagyl, stools are better, where watery and now are loose only.  Review of Systems  No fever chills + Sneezing, no itchy eyes or nose No chest pain, breathing at baseline No nausea, vomiting. No abdominal or flank pain.   Past Medical History  Diagnosis Date  . Subdural hematoma ([place]) 03/30/11  . Epidural abscess   . Staphylococcus aureus bacteremia with sepsis ([place])   . Prosthetic joint infection ([place])   . Hyperlipidemia   . History of blood clots   . Blood dyscrasia     pt CAN NOT have any blood thinners d/t hx brain bleed  . S/P right heart catheterization     at least 55yrs  . Asthma   . COPD (chronic obstructive pulmonary disease) ([facility])     emphysema  . Shortness of breath     with exertion  . Pneumonia     as a child and in 1952;had a pneumonia vaccine couple of years ago  . Peripheral neuropathy ([place])   . Short-term memory loss   . Arthritis     left leg also has swelling  . Gastric ulcer   . Constipation     miralax prn  . Depression     takes wellbutrin bid  . Urinary frequency     wears depends  . Complication of anesthesia     confusion with anesthesia  . Sleep apnea     has CPAP but doesn't use it;sleep study done at least 22yrs ago  . Melanoma ([place])   . Chronic kidney disease, stage III (moderate) 01/20/2013    Past Surgical History  Procedure Laterality Date  . Hernia repair        25+yrs ago  . External ear surgery      shunt placed in right ear d/t mennieres   . Total knee arthroplasty      x 2 left knee 2003/2012  . Filtering procedure      filter placed in neck d/t blood  clot  . Eye surgery      bil cataract surgery  . Skin biopsy      melanoma on head   . Knee arthroscopy    . Total knee revision  05/23/2011    Procedure: TOTAL KNEE REVISION;  Surgeon: Meredith Pel;  Location: [facility];  Service: Orthopedics;  Laterality: Left;  LEFT KNEE REMOVAL OF SPACER, POSSIBLE REIMPLANTATION OF REVISION TKA VERSES REPLACEMENT ANTIBIOTIC SPACER  . Epidural abscess drainage  12/2010    Social History   Social History  . Marital Status: Married    Spouse Name: N/A  . Number of Children: 3  . Years of Education: N/A   Occupational History  . retired    Social History Main Topics  . Smoking status: Former Smoker -- 2.00 packs/day for 40 years    Types: Cigarettes    Quit  date: 07/17/1978  . Smokeless tobacco: Never Used  . Alcohol Use: No  . Drug Use: No  . Sexual Activity: Not on file   Other Topics Concern  . Not on file   Social History Narrative   Lost wife 06-2013   Lives in his house, 1 daughter and her 2 adult children live w/ him (1g-child w/ special needs)           Medication List       This list is accurate as of: 11/19/15 11:59 PM.  Always use your most recent med list.               albuterol 108 (90 Base) MCG/ACT inhaler  Commonly known as:  PROAIR HFA  Inhale 2 puffs into the lungs every 6 (six) hours as needed for wheezing.     benzonatate 200 MG capsule  Commonly known as:  TESSALON  TAKE ONE CAPSULE 3-4 TIMES DAILY AS NEEDED FOR COUGH     budesonide-formoterol 160-4.5 MCG/ACT inhaler  Commonly known as:  SYMBICORT  Inhale 2 puffs into the lungs 2 (two) times daily.     buPROPion 200 MG 12 hr tablet  Commonly known as:  WELLBUTRIN SR  Take 1 tablet (200 mg total) by mouth 2 (two) times daily.     calcium  carbonate 600 MG Tabs tablet  Commonly known as:  OS-CAL  Take 600 mg by mouth daily.     diazepam 5 MG tablet  Commonly known as:  VALIUM  Take 5 mg by mouth every 6 (six) hours as needed for anxiety. Reported on 08/04/2015     doxazosin 4 MG tablet  Commonly known as:  CARDURA  Take 1 tablet (4 mg total) by mouth at bedtime.     fluticasone 50 MCG/ACT nasal spray  Commonly known as:  FLONASE  Place 2 sprays into the nose 2 (two) times daily.     HYDROcodone-acetaminophen 5-325 MG tablet  Commonly known as:  NORCO/VICODIN  Take 1 tablet by mouth every 6 (six) hours as needed.     meclizine 25 MG tablet  Commonly known as:  ANTIVERT  Take 25 mg by mouth daily as needed (vertigo). Reported on 08/04/2015     montelukast 10 MG tablet  Commonly known as:  SINGULAIR  Take 10 mg by mouth daily. Reported on 08/04/2015     multivitamin with minerals Tabs tablet  Take 1 tablet by mouth daily.     pantoprazole 40 MG tablet  Commonly known as:  PROTONIX  Take 1 tablet by mouth daily.     rOPINIRole 1 MG tablet  Commonly known as:  REQUIP  Take 1 tablet (1 mg total) by mouth at bedtime.     simvastatin 40 MG tablet  Commonly known as:  ZOCOR  Take 1 tablet (40 mg total) by mouth at bedtime.           Objective:   Physical Exam BP 124/60 mmHg  Pulse 69  Temp(Src) 97.8 F (36.6 C) (Oral)  Ht 5\' 10"  (1.778 m)  Wt 166 lb (75.297 kg)  BMI 23.82 kg/m2  SpO2 96% General:   Well developed, well nourished . NAD.  HEENT:  Normocephalic . Face symmetric, atraumatic Lungs:  Few rhonchi, no wheezing. Normal respiratory effort, no intercostal retractions, no accessory muscle use. Heart: RRR, soft systolic  murmur.  no pretibial edema bilaterally  Abdomen:  Not distended, soft, non-tender. No rebound or rigidity.  Skin: Not pale. Not jaundice  Neurologic:  alert & oriented X3.  Speech normal, gait appropriate for age and unassisted Psych--  Cognition and judgment appear  intact.  Cooperative with normal attention span and concentration.  Behavior appropriate. No anxious or depressed appearing.    Assessment & Plan:   Assessment > COPD- Dr Melvyn Novas 08-2015, stable, RTC PRN CKD -----Creatinine 1.67, GFR 32 Hyperlipidemia Depression Decreased memory Neuropathy H/o Mnire's  -- on diuretics  Osteoporosis -- h/o pelvic Fx, dexa April 2016: T score (-) 2.3; prolia-- not covered, rx fosamax 03-2015, dc after 3 months d/t dysphagia DJD  -- hydrocodone rarely RLS OSA no CPAP H/o melanoma H/o subdural hematoma, epidural abscess, staph aureus  sepsis H/o Prosthetic joint infection H/o Blood clots H/o gastric ulcer C diff dx 10-19-15, s/p flagyl x 2  Coordinate care with his daughter [name]  [phone]  Plan: COPD: pt w/ COPD presents with cough, pulmonary exam is actually benign, related to allergies? (Had a lot of mucus pooling in the throat) recommend conservative treatment with Flonase, Singulair, Tessalon Perles Diarrhea:  had diarrhea,  Protonix was dc w/ no help. Eventually stool studies show C. difficile, status post Flagyl 2, stools more formed. We agree on probiotics and will call for GI referral if symptoms resurface. Dysuria: One-day history of dysuria, unable to provide a sample; d/t h/o c diff  don't like to prescribed an empiric antibiotic, recommend to bring a urine sample for culture and sensitivity ASAP. If fever, chills or severe symptoms in the weekend is to go to the ER RTC 03-2016 as scheduled

## 2015-11-19 NOTE — Progress Notes (Signed)
Pre visit review using our clinic review tool, if applicable. No additional management support is needed unless otherwise documented below in the visit note. 

## 2015-11-20 ENCOUNTER — Emergency Department (HOSPITAL_BASED_OUTPATIENT_CLINIC_OR_DEPARTMENT_OTHER)
Admission: EM | Admit: 2015-11-20 | Discharge: 2015-11-20 | Disposition: A | Discharge status: Home / Self Care | Payer: Medicare Other | Attending: Emergency Medicine | Admitting: Emergency Medicine

## 2015-11-20 ENCOUNTER — Encounter (HOSPITAL_BASED_OUTPATIENT_CLINIC_OR_DEPARTMENT_OTHER): Payer: Self-pay | Admitting: *Deleted

## 2015-11-20 DIAGNOSIS — R39198 Other difficulties with micturition: Secondary | ICD-10-CM | POA: Diagnosis present

## 2015-11-20 DIAGNOSIS — F329 Major depressive disorder, single episode, unspecified: Secondary | ICD-10-CM | POA: Insufficient documentation

## 2015-11-20 DIAGNOSIS — B372 Candidiasis of skin and nail: Secondary | ICD-10-CM | POA: Insufficient documentation

## 2015-11-20 DIAGNOSIS — Z87891 Personal history of nicotine dependence: Secondary | ICD-10-CM | POA: Insufficient documentation

## 2015-11-20 DIAGNOSIS — N183 Chronic kidney disease, stage 3 (moderate): Secondary | ICD-10-CM | POA: Insufficient documentation

## 2015-11-20 DIAGNOSIS — N390 Urinary tract infection, site not specified: Secondary | ICD-10-CM | POA: Diagnosis not present

## 2015-11-20 DIAGNOSIS — E785 Hyperlipidemia, unspecified: Secondary | ICD-10-CM | POA: Insufficient documentation

## 2015-11-20 DIAGNOSIS — J449 Chronic obstructive pulmonary disease, unspecified: Secondary | ICD-10-CM | POA: Insufficient documentation

## 2015-11-20 DIAGNOSIS — J45909 Unspecified asthma, uncomplicated: Secondary | ICD-10-CM | POA: Diagnosis not present

## 2015-11-20 LAB — URINALYSIS, ROUTINE W REFLEX MICROSCOPIC
BILIRUBIN URINE: NEGATIVE
GLUCOSE, UA: NEGATIVE mg/dL
KETONES UR: NEGATIVE mg/dL
Nitrite: NEGATIVE
PH: 6.5 (ref 5.0–8.0)
PROTEIN: NEGATIVE mg/dL
Specific Gravity, Urine: 1.015 (ref 1.005–1.030)

## 2015-11-20 LAB — CBC WITH DIFFERENTIAL/PLATELET
Basophils Absolute: 0 10*3/uL (ref 0.0–0.1)
Basophils Relative: 0 %
EOS ABS: 0.2 10*3/uL (ref 0.0–0.7)
EOS PCT: 3 %
HCT: 41.2 % (ref 39.0–52.0)
HEMOGLOBIN: 13.9 g/dL (ref 13.0–17.0)
Lymphocytes Relative: 12 %
Lymphs Abs: 1 10*3/uL (ref 0.7–4.0)
MCH: 31.8 pg (ref 26.0–34.0)
MCHC: 33.7 g/dL (ref 30.0–36.0)
MCV: 94.3 fL (ref 78.0–100.0)
MONO ABS: 1.2 10*3/uL — AB (ref 0.1–1.0)
Monocytes Relative: 14 %
Neutro Abs: 5.8 10*3/uL (ref 1.7–7.7)
Neutrophils Relative %: 71 %
PLATELETS: 243 10*3/uL (ref 150–400)
RBC: 4.37 MIL/uL (ref 4.22–5.81)
RDW: 14 % (ref 11.5–15.5)
WBC: 8.2 10*3/uL (ref 4.0–10.5)

## 2015-11-20 LAB — URINE MICROSCOPIC-ADD ON: SQUAMOUS EPITHELIAL / LPF: NONE SEEN

## 2015-11-20 LAB — BASIC METABOLIC PANEL
Anion gap: 6 (ref 5–15)
BUN: 22 mg/dL — AB (ref 6–20)
CHLORIDE: 107 mmol/L (ref 101–111)
CO2: 24 mmol/L (ref 22–32)
Calcium: 8.7 mg/dL — ABNORMAL LOW (ref 8.9–10.3)
Creatinine, Ser: 1.55 mg/dL — ABNORMAL HIGH (ref 0.61–1.24)
GFR, EST AFRICAN AMERICAN: 44 mL/min — AB (ref >60)
GFR, EST NON AFRICAN AMERICAN: 38 mL/min — AB (ref >60)
Glucose, Bld: 95 mg/dL (ref 65–99)
POTASSIUM: 3.2 mmol/L — AB (ref 3.5–5.1)
SODIUM: 137 mmol/L (ref 135–145)

## 2015-11-20 MED ORDER — CEPHALEXIN 500 MG PO CAPS: 500.0000 mg | ORAL_CAPSULE | Freq: Three times a day (TID) | ORAL | Status: DC

## 2015-11-20 MED ORDER — NYSTATIN 100000 UNIT/GM EX CREA: TOPICAL_CREAM | CUTANEOUS | Status: DC

## 2015-11-20 NOTE — Assessment & Plan Note (Signed)
COPD: pt w/ COPD presents with cough, pulmonary exam is actually benign, related to allergies? (Had a lot of mucus pooling in the throat) recommend conservative treatment with Flonase, Singulair, Tessalon Perles Diarrhea:  had diarrhea,  Protonix was dc w/ no help. Eventually stool studies show C. difficile, status post Flagyl 2, stools more formed. We agree on probiotics and will call for GI referral if symptoms resurface. Dysuria: One-day history of dysuria, unable to provide a sample; d/t h/o c diff  don't like to prescribed an empiric antibiotic, recommend to bring a urine sample for culture and sensitivity ASAP. If fever, chills or severe symptoms in the weekend is to go to the ER RTC 03-2016 as scheduled

## 2015-11-20 NOTE — Discharge Instructions (Signed)
Please take antibiotics and antifungal cream as prescribed. Return without fail for worsening symptoms including fever, vomiting or unable to keep down food or fluids, abdominal pain or back pain, or any other symptoms concerning to you.  Urinary Tract Infection Urinary tract infections (UTIs) can develop anywhere along your urinary tract. Your urinary tract is your body's drainage system for removing wastes and extra water. Your urinary tract includes two kidneys, two ureters, a bladder, and a urethra. Your kidneys are a pair of bean-shaped organs. Each kidney is about the size of your fist. They are located below your ribs, one on each side of your spine. CAUSES Infections are caused by microbes, which are microscopic organisms, including fungi, viruses, and bacteria. These organisms are so small that they can only be seen through a microscope. Bacteria are the microbes that most commonly cause UTIs. SYMPTOMS  Symptoms of UTIs may vary by age and gender of the patient and by the location of the infection. Symptoms in young women typically include a frequent and intense urge to urinate and a painful, burning feeling in the bladder or urethra during urination. Older women and men are more likely to be tired, shaky, and weak and have muscle aches and abdominal pain. A fever may mean the infection is in your kidneys. Other symptoms of a kidney infection include pain in your back or sides below the ribs, nausea, and vomiting. DIAGNOSIS To diagnose a UTI, your caregiver will ask you about your symptoms. Your caregiver will also ask you to provide a urine sample. The urine sample will be tested for bacteria and white blood cells. White blood cells are made by your body to help fight infection. TREATMENT  Typically, UTIs can be treated with medication. Because most UTIs are caused by a bacterial infection, they usually can be treated with the use of antibiotics. The choice of antibiotic and length of treatment  depend on your symptoms and the type of bacteria causing your infection. HOME CARE INSTRUCTIONS  If you were prescribed antibiotics, take them exactly as your caregiver instructs you. Finish the medication even if you feel better after you have only taken some of the medication.  Drink enough water and fluids to keep your urine clear or pale yellow.  Avoid caffeine, tea, and carbonated beverages. They tend to irritate your bladder.  Empty your bladder often. Avoid holding urine for long periods of time.  Empty your bladder before and after sexual intercourse.  After a bowel movement, women should cleanse from front to back. Use each tissue only once. SEEK MEDICAL CARE IF:   You have back pain.  You develop a fever.  Your symptoms do not begin to resolve within 3 days. SEEK IMMEDIATE MEDICAL CARE IF:   You have severe back pain or lower abdominal pain.  You develop chills.  You have nausea or vomiting.  You have continued burning or discomfort with urination. MAKE SURE YOU:   Understand these instructions.  Will watch your condition.  Will get help right away if you are not doing well or get worse.   This information is not intended to replace advice given to you by your health care provider. Make sure you discuss any questions you have with your health care provider.   Document Released: 04/12/2005 Document Revised: 03/24/2015 Document Reviewed: 08/11/2011 Elsevier Interactive Patient Education 2016 Elsevier Inc.  Cutaneous Candidiasis Cutaneous candidiasis is a condition in which there is an overgrowth of yeast (candida) on the skin. Yeast normally live  on the skin, but in small enough numbers not to cause any symptoms. In certain cases, increased growth of the yeast may cause an actual yeast infection. This kind of infection usually occurs in areas of the skin that are constantly warm and moist, such as the armpits or the groin. Yeast is the most common cause of diaper  rash in babies and in people who cannot control their bowel movements (incontinence). CAUSES  The fungus that most often causes cutaneous candidiasis is Candida albicans. Conditions that can increase the risk of getting a yeast infection of the skin include:  Obesity.  Pregnancy.  Diabetes.  Taking antibiotic medicine.  Taking birth control pills.  Taking steroid medicines.  Thyroid disease.  An iron or zinc deficiency.  Problems with the immune system. SYMPTOMS   Red, swollen area of the skin.  Bumps on the skin.  Itchiness. DIAGNOSIS  The diagnosis of cutaneous candidiasis is usually based on its appearance. Light scrapings of the skin may also be taken and viewed under a microscope to identify the presence of yeast. TREATMENT  Antifungal creams may be applied to the infected skin. In severe cases, oral medicines may be needed.  HOME CARE INSTRUCTIONS   Keep your skin clean and dry.  Maintain a healthy weight.  If you have diabetes, keep your blood sugar under control. SEEK IMMEDIATE MEDICAL CARE IF:  Your rash continues to spread despite treatment.  You have a fever, chills, or abdominal pain.   This information is not intended to replace advice given to you by your health care provider. Make sure you discuss any questions you have with your health care provider.   Document Released: 03/21/2011 Document Revised: 09/25/2011 Document Reviewed: 01/04/2015 Elsevier Interactive Patient Education [employer].

## 2015-11-20 NOTE — ED Provider Notes (Signed)
CSN: [id number]     Arrival date & time 11/20/15  [id number] History   First MD Initiated Contact with Patient 11/20/15 [phone]     Chief Complaint  Patient presents with  . difficulty voiding      (Consider location/radiation/quality/duration/timing/severity/associated sxs/prior Treatment) HPI 82 year old male who presents with difficulty urinating. He has a history of a subdural hematoma, hyperlipidemia, COPD. He has been recently treated for C. difficile with a course of Flagyl via his primary care doctor. Yesterday developed some dysuria as well as urinary hesitancy and difficulty urinating. Had an at-home test that was positive for UTI. Was seen by his primary care doctor but was unable to urinate in the office. He was sent home with a cup for urine. This still having persistent symptoms of dysuria and urinary hesitancy. Denies any hematuria, flank pain, abdominal pain, fevers or chills, nausea or vomiting. Past Medical History  Diagnosis Date  . Subdural hematoma ([place]) 03/30/11  . Epidural abscess   . Staphylococcus aureus bacteremia with sepsis ([place])   . Prosthetic joint infection ([place])   . Hyperlipidemia   . History of blood clots   . Blood dyscrasia     pt CAN NOT have any blood thinners d/t hx brain bleed  . S/P right heart catheterization     at least 46yrs  . Asthma   . COPD (chronic obstructive pulmonary disease) ([facility])     emphysema  . Shortness of breath     with exertion  . Pneumonia     as a child and in 1952;had a pneumonia vaccine couple of years ago  . Peripheral neuropathy ([place])   . Short-term memory loss   . Arthritis     left leg also has swelling  . Gastric ulcer   . Constipation     miralax prn  . Depression     takes wellbutrin bid  . Urinary frequency     wears depends  . Complication of anesthesia     confusion with anesthesia  . Sleep apnea     has CPAP but doesn't use it;sleep study done at least 13yrs ago  . Melanoma ([place])   . Chronic kidney disease,  stage III (moderate) 01/20/2013   Past Surgical History  Procedure Laterality Date  . Hernia repair      25+yrs ago  . External ear surgery      shunt placed in right ear d/t mennieres   . Total knee arthroplasty      x 2 left knee 2003/2012  . Filtering procedure      filter placed in neck d/t blood  clot  . Eye surgery      bil cataract surgery  . Skin biopsy      melanoma on head   . Knee arthroscopy    . Total knee revision  05/23/2011    Procedure: TOTAL KNEE REVISION;  Surgeon: Meredith Pel;  Location: [facility];  Service: Orthopedics;  Laterality: Left;  LEFT KNEE REMOVAL OF SPACER, POSSIBLE REIMPLANTATION OF REVISION TKA VERSES REPLACEMENT ANTIBIOTIC SPACER  . Epidural abscess drainage  12/2010   Family History  Problem Relation Age of Onset  . CAD Neg Hx   . Diabetes Neg Hx   . Melanoma Brother    Social History  Substance Use Topics  . Smoking status: Former Smoker -- 2.00 packs/day for 40 years    Types: Cigarettes    Quit date: 07/17/1978  . Smokeless tobacco: Never Used  . Alcohol Use:  No    Review of Systems 10/14 systems reviewed and are negative other than those stated in the HPI    Allergies  Anticoagulant compound  Home Medications   Prior to Admission medications   Medication Sig Start Date End Date Taking? Authorizing Provider  albuterol (PROAIR HFA) 108 (90 BASE) MCG/ACT inhaler Inhale 2 puffs into the lungs every 6 (six) hours as needed for wheezing. 07/31/14   Elsie Stain, MD  benzonatate (TESSALON) 200 MG capsule TAKE ONE CAPSULE 3-4 TIMES DAILY AS NEEDED FOR COUGH 08/16/15   Tanda Rockers, MD  budesonide-formoterol [facility]) 160-4.5 MCG/ACT inhaler Inhale 2 puffs into the lungs 2 (two) times daily.    Historical Provider, MD  buPROPion (WELLBUTRIN SR) 200 MG 12 hr tablet Take 1 tablet (200 mg total) by mouth 2 (two) times daily. 06/28/15   Colon Branch, MD  calcium carbonate (OS-CAL) 600 MG TABS Take 600 mg by mouth daily.     Historical  Provider, MD  cephALEXin (KEFLEX) 500 MG capsule Take 1 capsule (500 mg total) by mouth 3 (three) times daily. 11/20/15   Forde Dandy, MD  diazepam (VALIUM) 5 MG tablet Take 5 mg by mouth every 6 (six) hours as needed for anxiety. Reported on 08/04/2015    Historical Provider, MD  doxazosin (CARDURA) 4 MG tablet Take 1 tablet (4 mg total) by mouth at bedtime. 05/18/15   Colon Branch, MD  fluticasone [facility]) 50 MCG/ACT nasal spray Place 2 sprays into the nose 2 (two) times daily. 08/08/12   Elsie Stain, MD  HYDROcodone-acetaminophen (NORCO/VICODIN) 5-325 MG per tablet Take 1 tablet by mouth every 6 (six) hours as needed. 09/01/14   Colon Branch, MD  meclizine (ANTIVERT) 25 MG tablet Take 25 mg by mouth daily as needed (vertigo). Reported on 08/04/2015    Historical Provider, MD  montelukast (SINGULAIR) 10 MG tablet Take 10 mg by mouth daily. Reported on 08/04/2015    Historical Provider, MD  Multiple Vitamin (MULTIVITAMIN WITH MINERALS) TABS Take 1 tablet by mouth daily.    Historical Provider, MD  nystatin cream (MYCOSTATIN) Apply to affected area 2 times daily until resolution of rash. 11/20/15   Forde Dandy, MD  pantoprazole (PROTONIX) 40 MG tablet Take 1 tablet by mouth daily. 09/20/15   Historical Provider, MD  rOPINIRole (REQUIP) 1 MG tablet Take 1 tablet (1 mg total) by mouth at bedtime. 09/20/15   Colon Branch, MD  simvastatin (ZOCOR) 40 MG tablet Take 1 tablet (40 mg total) by mouth at bedtime. 04/16/15   Colon Branch, MD   BP 134/77 mmHg  Pulse 77  Temp(Src) 97.5 F (36.4 C) (Oral)  Resp 20  Ht 5\' 9"  (1.753 m)  Wt 161 lb (73.029 kg)  BMI 23.76 kg/m2  SpO2 96% Physical Exam  Physical Exam  Nursing note and vitals reviewed. Constitutional: Well developed, well nourished, non-toxic, and in no acute distress Head: Normocephalic and atraumatic.  Mouth/Throat: Oropharynx is clear and moist.  Neck: Normal range of motion. Neck supple.  Cardiovascular: Normal rate and regular rhythm.    Pulmonary/Chest: Effort normal and breath sounds normal.  Abdominal: Soft. There is no tenderness. There is no rebound and no guarding. No CVA tenderness. Musculoskeletal: Normal range of motion.  Neurological: Alert, no facial droop, fluent speech, moves all extremities symmetrically Skin: Skin is warm and dry. beefy red rash in bilateral inguinal folds. Psychiatric: Cooperative   ED Course  Procedures (including critical care time)  Labs Review Labs Reviewed  URINALYSIS, ROUTINE W REFLEX MICROSCOPIC (NOT AT [facility]) - Abnormal; Notable for the following:    APPearance TURBID (*)    Hgb urine dipstick SMALL (*)    Leukocytes, UA LARGE (*)    All other components within normal limits  CBC WITH DIFFERENTIAL/PLATELET - Abnormal; Notable for the following:    Monocytes Absolute 1.2 (*)    All other components within normal limits  BASIC METABOLIC PANEL - Abnormal; Notable for the following:    Potassium 3.2 (*)    BUN 22 (*)    Creatinine, Ser 1.55 (*)    Calcium 8.7 (*)    GFR calc non Af Amer 38 (*)    GFR calc Af Amer 44 (*)    All other components within normal limits  URINE MICROSCOPIC-ADD ON - Abnormal; Notable for the following:    Bacteria, UA MANY (*)    All other components within normal limits  URINE CULTURE    Imaging Review No results found. I have personally reviewed and evaluated these images and lab results as part of my medical decision-making.   EKG Interpretation None      MDM   Final diagnoses:  UTI (lower urinary tract infection)  Candidal intertrigo    82 year old male who presents with 2 days of dysuria and urinary difficulty. On presentation he is nontoxic and in no acute distress. Does not have any abdominal or CVA tenderness. Is not retaining any urine. UA does suggest urinary tract infection. No systemic signs or symptoms to suggest pyelonephritis or other systemic illness. Urine is sent for culture. He is given a course of Keflex for treatment.  He has baseline kidney function on blood work. In the remainder of the CBC and basic metabolic panel is unremarkable. He has evidence of candidal intertrigo also on exam over his inguinal folds, and given a prescription of nystatin cream. Discussed strict return and follow-up instructions.    Forde Dandy, MD 11/20/15 1126

## 2015-11-20 NOTE — ED Notes (Addendum)
States has been drinking fluids as normal, also noted to be "leaking urine" times, suprapubic area soft non-distended, non-tender

## 2015-11-20 NOTE — ED Notes (Signed)
DC instructions reviewed with pt and daughter along with the two Rx as written by the ED provider, stressed the importance of handwashing esp before and after applying abx oint to rash areas, importance of PO fluids and also importance of taking and completing all abx as prescribed. Opportunity for questions provided

## 2015-11-20 NOTE — ED Notes (Addendum)
Been having a lot of urinary frequency and urgency, yesterday evening has been unable to void. Having at suprapubic. States saw primary care MD yesterday, Dr. French Ana, w/ [facility], was told had a UTI

## 2015-11-22 LAB — URINE CULTURE: Culture: >=100000 — AB

## 2015-11-23 ENCOUNTER — Telehealth (HOSPITAL_BASED_OUTPATIENT_CLINIC_OR_DEPARTMENT_OTHER): Payer: Self-pay | Admitting: Emergency Medicine

## 2015-11-23 NOTE — Telephone Encounter (Signed)
Post ED Visit - Positive Culture Follow-up: Successful Patient Follow-Up  Culture assessed and recommendations reviewed by: []  Elenor Quinones, Pharm.D. []  Heide Guile, Pharm.D., BCPS []  Parks Neptune, Pharm.D. []  Alycia Rossetti, Pharm.D., BCPS []  [place], Pharm.D., BCPS, AAHIVP []  Legrand Como, Pharm.D., BCPS, AAHIVP [x]  Milus Glazier, Pharm.D. []  Stephens November, Pharm.D.  Positive urine culture  []  Patient discharged without antimicrobial prescription and treatment is now indicated [x]  Organism is resistant to prescribed ED discharge antimicrobial []  Patient with positive blood cultures  Changes discussed with ED provider: Lenn Sink PA New antibiotic prescription stop Cephalexin, start Fosfomycin 3 gram po q 3 days x 3 doses Called to [name] patient, 11/23/15 1531   Micheal Frank 11/23/2015, 3:30 PM

## 2015-11-23 NOTE — Progress Notes (Signed)
ED Antimicrobial Stewardship Positive Culture Follow Up   Micheal Frank is an 82 y.o. male who presented to [facility] on 11/20/2015 with a chief complaint of  Chief Complaint  Patient presents with  . difficulty voiding     Recent Results (from the past 720 hour(s))  Urine culture     Status: Abnormal   Collection Time: 11/20/15 10:07 AM  Result Value Ref Range Status   Specimen Description URINE, CLEAN CATCH  Final   Special Requests NONE  Final   Culture >=100,000 COLONIES/mL PSEUDOMONAS AERUGINOSA (A)  Final   Report Status 11/22/2015 FINAL  Final   Organism ID, Bacteria PSEUDOMONAS AERUGINOSA (A)  Final      Susceptibility   Pseudomonas aeruginosa - MIC*    CEFTAZIDIME 4 SENSITIVE Sensitive     CIPROFLOXACIN 1 SENSITIVE Sensitive     GENTAMICIN 8 INTERMEDIATE Intermediate     IMIPENEM <=0.25 SENSITIVE Sensitive     PIP/TAZO 16 SENSITIVE Sensitive     CEFEPIME 8 SENSITIVE Sensitive     * >=100,000 COLONIES/mL PSEUDOMONAS AERUGINOSA    [x]  Treated with cephalexin, organism resistant to prescribed antimicrobial  New antibiotic prescription: Fosfomycin 3 gm PO every 3 days x 3 doses  ED Provider: Lenn Sink, PA-C  Wandalee Klang L. Nicole Kindred, PharmD PGY2 Infectious Diseases Pharmacy Resident Pager: [phone] 11/23/2015 8:44 AM

## 2015-11-24 ENCOUNTER — Telehealth: Payer: Self-pay | Admitting: Internal Medicine

## 2015-11-24 MED ORDER — CIPROFLOXACIN HCL 250 MG PO TABS: 250.0000 mg | ORAL_TABLET | Freq: Two times a day (BID) | ORAL | Status: DC

## 2015-11-24 NOTE — Telephone Encounter (Signed)
Spoke w/ [name], informed her of Dr. Larose Kells recommendations. Cipro sent to [facility] by Dr. Larose Kells, Rx resent to [facility] on [address] Dr. Damaris Schooner w/ [facility], Rx cancelled.

## 2015-11-24 NOTE — Telephone Encounter (Signed)
Caller name: Herbert Deaner Relationship to patient: daughter Can be reached: [phone] Pharmacy: [id number] [address], [place]  Reason for call: [name] states pt was seen here Friday and in ER Saturday for UTI. She got a call yesterday that the culture was back and resistant to the 1st abx and that fosfomycin was being sent to pharmacy. When she talked to the pharmacy they had RX for xifaxan. She is concerned that wrong med was sent and wants call from Dr. Kyra Leyland.

## 2015-11-24 NOTE — Telephone Encounter (Signed)
Patient had dysuria, leukocyturia and now + urine culture for Pseudomonas. His creatinine clearance is 36  Advise family: Stop any other  antibiotics, start ciprofloxacin 250 mg one twice a day for 5 days (Rx sent) If he is not getting better, has fever chills -- let us know. If he has chronic problems with difficulty urinating I need to know, may need further evaluation.

## 2015-11-24 NOTE — Telephone Encounter (Signed)
Please advise 

## 2015-12-03 ENCOUNTER — Telehealth: Payer: Self-pay | Admitting: Internal Medicine

## 2015-12-03 MED ORDER — TRIAMTERENE-HCTZ 37.5-25 MG PO TABS: 0.5000 | ORAL_TABLET | Freq: Every day | ORAL | Status: DC

## 2015-12-03 NOTE — Telephone Encounter (Signed)
Medication was discontinued 09/13/2015, at the time of a pulmonary visit. Reason unclear. he was taking Maxide 25----1/2 tablet daily. Okay to refill medication #30, 2 refills.

## 2015-12-03 NOTE — Telephone Encounter (Signed)
Rx sent 

## 2015-12-03 NOTE — Telephone Encounter (Signed)
Med no longer on med list. Was medication d/c?

## 2015-12-03 NOTE — Telephone Encounter (Signed)
Caller name: [name] Relationship to patient: daughter Can be reached: [phone] Pharmacy: [id number] [place], [place]  Reason for call: pt daughter called for refill on triamterene-hydrochlorothiazide (MAXZIDE-25) 37.5-25 MG per tablet 1 tablet. She believes this is the correct dose. Pt also tood the 75-50mg  tablet before. Whichever is the correct one. She said he has a few days and would like to pick up on Monday at the pharmacy.

## 2016-01-05 ENCOUNTER — Ambulatory Visit: Payer: Medicare Other | Admitting: Internal Medicine

## 2016-01-17 ENCOUNTER — Telehealth: Payer: Self-pay | Admitting: Internal Medicine

## 2016-01-17 MED ORDER — MONTELUKAST SODIUM 10 MG PO TABS: 10.0000 mg | ORAL_TABLET | Freq: Every day | ORAL | Status: DC

## 2016-01-17 NOTE — Telephone Encounter (Signed)
OK to give rx for Singulair 10 mg tabs 1 tab po daily disp #30 with 2 rf or #90 with none at patient discretion

## 2016-01-17 NOTE — Telephone Encounter (Signed)
Noted Rx sent to pharmacy  

## 2016-01-17 NOTE — Telephone Encounter (Signed)
Last filled:  Listed as historical med montelukast (SINGULAIR) 10 MG tablet Take 10 mg by mouth at bedtime as needed.        Last OV: 11/19/15 Next OV: 04/07/16  Please advise.

## 2016-01-17 NOTE — Telephone Encounter (Signed)
Caller name: [name] Relationship to patient: Daughter Can be reached: [phone] Pharmacy:  [facility] [place], [place] [address] [id number] (Phone) [phone] (Fax)         Reason for call: Request refill on montelukast (SINGULAIR) 10 MG tablet

## 2016-02-19 ENCOUNTER — Emergency Department (HOSPITAL_BASED_OUTPATIENT_CLINIC_OR_DEPARTMENT_OTHER)
Admission: EM | Admit: 2016-02-19 | Discharge: 2016-02-19 | Disposition: A | Discharge status: Home / Self Care | Payer: Medicare Other | Source: Home / Self Care | Attending: Emergency Medicine | Admitting: Emergency Medicine

## 2016-02-19 ENCOUNTER — Inpatient Hospital Stay (HOSPITAL_COMMUNITY)
Admission: EM | Admit: 2016-02-19 | Discharge: 2016-02-22 | DRG: 872 | Disposition: A | Discharge status: Home / Self Care | Payer: Medicare Other | Attending: Internal Medicine | Admitting: Internal Medicine

## 2016-02-19 ENCOUNTER — Encounter (HOSPITAL_COMMUNITY): Payer: Self-pay | Admitting: Emergency Medicine

## 2016-02-19 ENCOUNTER — Encounter (HOSPITAL_BASED_OUTPATIENT_CLINIC_OR_DEPARTMENT_OTHER): Payer: Self-pay | Admitting: *Deleted

## 2016-02-19 DIAGNOSIS — N183 Chronic kidney disease, stage 3 unspecified: Secondary | ICD-10-CM | POA: Diagnosis present

## 2016-02-19 DIAGNOSIS — Z9889 Other specified postprocedural states: Secondary | ICD-10-CM | POA: Insufficient documentation

## 2016-02-19 DIAGNOSIS — R0902 Hypoxemia: Secondary | ICD-10-CM | POA: Diagnosis present

## 2016-02-19 DIAGNOSIS — N390 Urinary tract infection, site not specified: Secondary | ICD-10-CM | POA: Insufficient documentation

## 2016-02-19 DIAGNOSIS — R55 Syncope and collapse: Secondary | ICD-10-CM | POA: Diagnosis not present

## 2016-02-19 DIAGNOSIS — R319 Hematuria, unspecified: Secondary | ICD-10-CM

## 2016-02-19 DIAGNOSIS — Z87891 Personal history of nicotine dependence: Secondary | ICD-10-CM

## 2016-02-19 DIAGNOSIS — G4733 Obstructive sleep apnea (adult) (pediatric): Secondary | ICD-10-CM | POA: Diagnosis present

## 2016-02-19 DIAGNOSIS — F0390 Unspecified dementia without behavioral disturbance: Secondary | ICD-10-CM | POA: Diagnosis present

## 2016-02-19 DIAGNOSIS — I959 Hypotension, unspecified: Secondary | ICD-10-CM

## 2016-02-19 DIAGNOSIS — I129 Hypertensive chronic kidney disease with stage 1 through stage 4 chronic kidney disease, or unspecified chronic kidney disease: Secondary | ICD-10-CM | POA: Diagnosis present

## 2016-02-19 DIAGNOSIS — G2581 Restless legs syndrome: Secondary | ICD-10-CM | POA: Diagnosis present

## 2016-02-19 DIAGNOSIS — Z79899 Other long term (current) drug therapy: Secondary | ICD-10-CM | POA: Insufficient documentation

## 2016-02-19 DIAGNOSIS — Z808 Family history of malignant neoplasm of other organs or systems: Secondary | ICD-10-CM

## 2016-02-19 DIAGNOSIS — H9190 Unspecified hearing loss, unspecified ear: Secondary | ICD-10-CM | POA: Diagnosis present

## 2016-02-19 DIAGNOSIS — Z96652 Presence of left artificial knee joint: Secondary | ICD-10-CM | POA: Diagnosis present

## 2016-02-19 DIAGNOSIS — J449 Chronic obstructive pulmonary disease, unspecified: Secondary | ICD-10-CM | POA: Insufficient documentation

## 2016-02-19 DIAGNOSIS — Z85820 Personal history of malignant melanoma of skin: Secondary | ICD-10-CM

## 2016-02-19 DIAGNOSIS — Z7951 Long term (current) use of inhaled steroids: Secondary | ICD-10-CM

## 2016-02-19 DIAGNOSIS — A419 Sepsis, unspecified organism: Secondary | ICD-10-CM | POA: Diagnosis not present

## 2016-02-19 DIAGNOSIS — E785 Hyperlipidemia, unspecified: Secondary | ICD-10-CM | POA: Diagnosis present

## 2016-02-19 DIAGNOSIS — R339 Retention of urine, unspecified: Secondary | ICD-10-CM | POA: Diagnosis present

## 2016-02-19 DIAGNOSIS — D72829 Elevated white blood cell count, unspecified: Secondary | ICD-10-CM

## 2016-02-19 DIAGNOSIS — B965 Pseudomonas (aeruginosa) (mallei) (pseudomallei) as the cause of diseases classified elsewhere: Secondary | ICD-10-CM | POA: Diagnosis present

## 2016-02-19 HISTORY — DX: Zoster without complications: B02.9

## 2016-02-19 LAB — URINALYSIS, ROUTINE W REFLEX MICROSCOPIC
BILIRUBIN URINE: NEGATIVE
GLUCOSE, UA: NEGATIVE mg/dL
KETONES UR: NEGATIVE mg/dL
Nitrite: POSITIVE — AB
PH: 7 (ref 5.0–8.0)
Protein, ur: NEGATIVE mg/dL
Specific Gravity, Urine: 1.014 (ref 1.005–1.030)

## 2016-02-19 LAB — URINE MICROSCOPIC-ADD ON

## 2016-02-19 MED ORDER — CEPHALEXIN 500 MG PO CAPS: 500.0000 mg | ORAL_CAPSULE | Freq: Two times a day (BID) | ORAL | 0 refills | Status: DC

## 2016-02-19 MED ORDER — TAMSULOSIN HCL 0.4 MG PO CAPS: 0.4000 mg | ORAL_CAPSULE | Freq: Every day | ORAL | 0 refills | Status: DC

## 2016-02-19 NOTE — ED Provider Notes (Signed)
[facility] Provider Note   CSN: [id number] Arrival date & time: 02/19/16  [id number]  First Provider Contact:  First MD Initiated Contact with Patient 02/19/16 [phone]        History   Chief Complaint Chief Complaint  Patient presents with  . Urinary Retention    HPI Micheal Frank is a 82 y.o. male.  Patient presents with urinary retention. He states that he was urinating fine yesterday. He hasn't been able to urinate within the last 3-4 hours. He complains of increasing pressure and pain to his lower abdomen. He denies any nausea or vomiting. No fevers or chills. He has been taking some Benadryl over the last couple of days for itching. He took 2 tablets over the last 2 days. He currently denies any itching. He denies any rash. He has had to have a catheter placed in the past when he had a urinary tract infection. He states he currently does not see a urologist.      Past Medical History:  Diagnosis Date  . Arthritis    left leg also has swelling  . Asthma   . Blood dyscrasia    pt CAN NOT have any blood thinners d/t hx brain bleed  . Chronic kidney disease, stage III (moderate) 01/20/2013  . Complication of anesthesia    confusion with anesthesia  . Constipation    miralax prn  . COPD (chronic obstructive pulmonary disease) (HCC)    emphysema  . Depression    takes wellbutrin bid  . Epidural abscess   . Gastric ulcer   . History of blood clots   . Hyperlipidemia   . Melanoma ([place])   . Peripheral neuropathy ([place])   . Pneumonia    as a child and in 1952;had a pneumonia vaccine couple of years ago  . Prosthetic joint infection ([place])   . S/P right heart catheterization    at least 63yrs  . Shingles   . Short-term memory loss   . Shortness of breath    with exertion  . Sleep apnea    has CPAP but doesn't use it;sleep study done at least 60yrs ago  . Staphylococcus aureus bacteremia with sepsis ([place])   . Subdural hematoma ([place]) 03/30/11  . Urinary frequency     wears depends    Patient Active Problem List   Diagnosis Date Noted  . Chronic cough 08/16/2015  . PCP NOTES >>>>>> 04/06/2015  . CTS (carpal tunnel syndrome) 11/16/2014  . Hyperlipidemia 08/17/2014  . Sleep apnea 08/17/2014  . Superficial bruising of chest wall 07/29/2014  . Urgency incontinence 01/23/2013  . Chronic kidney disease, stage III (moderate) 01/20/2013  . Pelvic fracture ([place]) 01/19/2013  . At high risk for falls 11/28/2012  . Lower leg edema 04/26/2011  . Prosthetic joint infection, left knee 03/21/2011  . ALLERGIC RHINITIS 12/09/2009  . CARCINOMA IN SITU, SKIN 04/30/2008  . RESTLESS LEG SYNDROME 04/30/2008  . NEUROPATHY 04/30/2008  . GLAUCOMA, BORDERLINE 04/30/2008  . Meniere's disease 04/30/2008  . HEARING LOSS 04/30/2008  . COPD GOLD II  04/30/2008  . DJD (degenerative joint disease) 04/30/2008  . Osteoporosis 04/30/2008  . SLEEP APNEA 04/30/2008  . HISTORY OF ASBESTOS EXPOSURE 04/30/2008    Past Surgical History:  Procedure Laterality Date  . epidural abscess drainage  12/2010  . 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 EYE SURGERY     bil cataract surgery  . FILTERING PROCEDURE  filter placed in neck d/t blood  clot  . HERNIA REPAIR     25+yrs ago  . KNEE ARTHROSCOPY    . SKIN BIOPSY     melanoma on head   . TOTAL KNEE ARTHROPLASTY     x 2 left knee 2003/2012  . TOTAL KNEE REVISION  05/23/2011   Procedure: TOTAL KNEE REVISION;  Surgeon: Meredith Pel;  Location: [facility];  Service: Orthopedics;  Laterality: Left;  LEFT KNEE REMOVAL OF SPACER, POSSIBLE REIMPLANTATION OF REVISION TKA VERSES REPLACEMENT ANTIBIOTIC SPACER       Home Medications    Prior to Admission medications   Medication Sig Start Date End Date Taking? Authorizing Provider  albuterol (PROAIR HFA) 108 (90 BASE) MCG/ACT inhaler Inhale 2 puffs into the lungs every 6 (six) hours as needed for wheezing. 07/31/14  Yes Elsie Stain, MD    benzonatate (TESSALON) 200 MG capsule TAKE ONE CAPSULE 3-4 TIMES DAILY AS NEEDED FOR COUGH 08/16/15  Yes Tanda Rockers, MD  budesonide-formoterol [facility]) 160-4.5 MCG/ACT inhaler Inhale 2 puffs into the lungs 2 (two) times daily.   Yes Historical Provider, MD  buPROPion (WELLBUTRIN SR) 200 MG 12 hr tablet Take 1 tablet (200 mg total) by mouth 2 (two) times daily. 06/28/15  Yes Colon Branch, MD  calcium carbonate (OS-CAL) 600 MG TABS Take 600 mg by mouth daily.    Yes Historical Provider, MD  diazepam (VALIUM) 5 MG tablet Take 5 mg by mouth every 6 (six) hours as needed for anxiety. Reported on 08/04/2015   Yes Historical Provider, MD  doxazosin (CARDURA) 4 MG tablet Take 1 tablet (4 mg total) by mouth at bedtime. 05/18/15  Yes Colon Branch, MD  fluticasone [facility]) 50 MCG/ACT nasal spray Place 2 sprays into the nose 2 (two) times daily. 08/08/12  Yes Elsie Stain, MD  HYDROcodone-acetaminophen (NORCO/VICODIN) 5-325 MG per tablet Take 1 tablet by mouth every 6 (six) hours as needed. 09/01/14  Yes Colon Branch, MD  meclizine (ANTIVERT) 25 MG tablet Take 25 mg by mouth daily as needed (vertigo). Reported on 08/04/2015   Yes Historical Provider, MD  montelukast (SINGULAIR) 10 MG tablet Take 1 tablet (10 mg total) by mouth daily. Reported on 08/04/2015 01/17/16  Yes Mosie Lukes, MD  Multiple Vitamin (MULTIVITAMIN WITH MINERALS) TABS Take 1 tablet by mouth daily.   Yes Historical Provider, MD  nystatin cream (MYCOSTATIN) Apply to affected area 2 times daily until resolution of rash. 11/20/15  Yes Forde Dandy, MD  OMEPRAZOLE PO Take by mouth.   Yes Historical Provider, MD  rOPINIRole (REQUIP) 1 MG tablet Take 1 tablet (1 mg total) by mouth at bedtime. 09/20/15  Yes Colon Branch, MD  simvastatin (ZOCOR) 40 MG tablet Take 1 tablet (40 mg total) by mouth at bedtime. 04/16/15  Yes Colon Branch, MD  triamterene-hydrochlorothiazide (MAXZIDE-25) 37.5-25 MG tablet Take 0.5 tablets by mouth daily. 12/03/15  Yes Colon Branch, MD   cephALEXin (KEFLEX) 500 MG capsule Take 1 capsule (500 mg total) by mouth 2 (two) times daily. 02/19/16   Malvin Johns, MD  ciprofloxacin (CIPRO) 250 MG tablet Take 1 tablet (250 mg total) by mouth 2 (two) times daily. 11/24/15   Colon Branch, MD  pantoprazole (PROTONIX) 40 MG tablet Take 1 tablet by mouth daily. 09/20/15   Historical Provider, MD  tamsulosin (FLOMAX) 0.4 MG CAPS capsule Take 1 capsule (0.4 mg total) by mouth daily. 02/19/16   Malvin Johns,  MD    Family History Family History  Problem Relation Age of Onset  . Melanoma Brother   . CAD Neg Hx   . Diabetes Neg Hx     Social History Social History  Substance Use Topics  . Smoking status: Former Smoker    Packs/day: 2.00    Years: 40.00    Types: Cigarettes    Quit date: 07/17/1978  . Smokeless tobacco: Never Used  . Alcohol use No     Allergies   Anticoagulant compound   Review of Systems Review of Systems  Constitutional: Negative for chills, diaphoresis, fatigue and fever.  HENT: Negative for congestion, rhinorrhea and sneezing.   Eyes: Negative.   Respiratory: Negative for cough, chest tightness and shortness of breath.   Cardiovascular: Negative for chest pain and leg swelling.  Gastrointestinal: Positive for abdominal pain. Negative for blood in stool, diarrhea, nausea and vomiting.  Genitourinary: Positive for decreased urine volume and dysuria. Negative for difficulty urinating, flank pain, frequency and hematuria.  Musculoskeletal: Negative for arthralgias and back pain.  Skin: Negative for rash.  Neurological: Negative for dizziness, speech difficulty, weakness, numbness and headaches.     Physical Exam Updated Vital Signs BP 172/98 (BP Location: Left Arm)   Pulse 91   Temp 97.6 F (36.4 C) (Oral)   Ht 5' 9.5" (1.765 m)   Wt 154 lb (69.9 kg)   SpO2 97%   BMI 22.42 kg/m   Physical Exam  Constitutional: He is oriented to person, place, and time. He appears well-developed and well-nourished.   HENT:  Head: Normocephalic and atraumatic.  Eyes: Pupils are equal, round, and reactive to light.  Neck: Normal range of motion. Neck supple.  Cardiovascular: Normal rate, regular rhythm and normal heart sounds.   Pulmonary/Chest: Effort normal and breath sounds normal. No respiratory distress. He has no wheezes. He has no rales. He exhibits no tenderness.  Abdominal: Soft. Bowel sounds are normal. There is no tenderness. There is no rebound and no guarding.  Positive suprapubic tenderness  Musculoskeletal: Normal range of motion. He exhibits no edema.  Lymphadenopathy:    He has no cervical adenopathy.  Neurological: He is alert and oriented to person, place, and time.  Skin: Skin is warm and dry. No rash noted.  Psychiatric: He has a normal mood and affect.     ED Treatments / Results  Labs (all labs ordered are listed, but only abnormal results are displayed) Labs Reviewed  URINALYSIS, ROUTINE W REFLEX MICROSCOPIC (NOT AT [facility]) - Abnormal; Notable for the following:       Result Value   APPearance CLOUDY (*)    Hgb urine dipstick SMALL (*)    Nitrite POSITIVE (*)    Leukocytes, UA LARGE (*)    All other components within normal limits  URINE MICROSCOPIC-ADD ON - Abnormal; Notable for the following:    Squamous Epithelial / LPF 0-5 (*)    Bacteria, UA MANY (*)    All other components within normal limits  URINE CULTURE    EKG  EKG Interpretation None       Radiology No results found.  Procedures Procedures (including critical care time)  Medications Ordered in ED Medications - No data to display   Initial Impression / Assessment and Plan / ED Course  I have reviewed the triage vital signs and the nursing notes.  Pertinent labs & imaging results that were available during my care of the patient were reviewed by me and considered in my  medical decision making (see chart for details).  Clinical Course    Patient had a Foley catheter placed and over 500 cc  of clear urine return. It does appear to be infected. I will start the patient on Keflex and Flomax. His last urine culture was susceptible to cephalosporins. He's feeling much better. He was discharged home in good condition. He was encouraged to make a follow-up appointment with his PCP. He doesn't currently have a urologist but may need a referral.  Final Clinical Impressions(s) / ED Diagnoses   Final diagnoses:  Urinary retention  UTI (lower urinary tract infection)    New Prescriptions New Prescriptions   CEPHALEXIN (KEFLEX) 500 MG CAPSULE    Take 1 capsule (500 mg total) by mouth 2 (two) times daily.   TAMSULOSIN (FLOMAX) 0.4 MG CAPS CAPSULE    Take 1 capsule (0.4 mg total) by mouth daily.     Malvin Johns, MD 02/19/16 1058

## 2016-02-19 NOTE — ED Provider Notes (Signed)
First MD Initiated Contact with Patient 02/19/16 0010        By signing my name below, I, Maud Deed. Royston Sinner, attest that this documentation has been prepared under the direction and in the presence of [place], DO.  Electronically Signed: Maud Deed. Royston Sinner, ED Scribe. 02/19/16. 12:25 AM.   TIME SEEN: 12:10 AM   CHIEF COMPLAINT:  Chief Complaint  Patient presents with  . Hypotension     HPI:   HPI Comments: Micheal Frank, brought in by EMS is a 82 y.o. male with a PMHx of hyperlipidemia who presents to the Emergency Department here for hypotension this evening. Daughter states pt was eating a cinnamon muffin when he called to her as he stated he felt as though he was going to pass out. She states he then began to vomit intermittently followed by short episodes of Recurrent syncope. However, currently he is at his baseline. No recent fever, chills, chest pain, shortness of breath, abdominal pain, or diarrhea. He does not wear any oxygen at home. No recent changes to medications. He is not on blood pressure medication. He is on triamterene for Mnire's. Pt lives at home with his daughter.   Pt was recently [facility] at [facility] on 02/19/16 for urinary retention. At that time, he was diagnosed with a urinary tract infection. Foley catheter was placed with over 500 CC of clear urine return. Pt was started on Keflex and Flomax.  PCP: Kathlene November, MD    ROS: See HPI Constitutional: no fever  Eyes: no drainage  ENT: no runny nose   Cardiovascular:  no chest pain  Resp: no SOB  GI: Positive vomiting GU: no dysuria Integumentary: no rash  Allergy: no hives  Musculoskeletal: no leg swelling  Neurological: no slurred speech. Positive syncope  ROS otherwise negative  PAST MEDICAL HISTORY/PAST SURGICAL HISTORY:  Past Medical History:  Diagnosis Date  . Arthritis    left leg also has swelling  . Asthma   . Blood dyscrasia    pt CAN NOT have any blood thinners d/t hx brain  bleed  . Chronic kidney disease, stage III (moderate) 01/20/2013  . Complication of anesthesia    confusion with anesthesia  . Constipation    miralax prn  . COPD (chronic obstructive pulmonary disease) (HCC)    emphysema  . Depression    takes wellbutrin bid  . Epidural abscess   . Gastric ulcer   . History of blood clots   . Hyperlipidemia   . Melanoma ([place])   . Peripheral neuropathy ([place])   . Pneumonia    as a child and in 1952;had a pneumonia vaccine couple of years ago  . Prosthetic joint infection ([place])   . S/P right heart catheterization    at least 41yrs  . Shingles   . Short-term memory loss   . Shortness of breath    with exertion  . Sleep apnea    has CPAP but doesn't use it;sleep study done at least 76yrs ago  . Staphylococcus aureus bacteremia with sepsis ([place])   . Subdural hematoma ([place]) 03/30/11  . Urinary frequency    wears depends    MEDICATIONS:  Prior to Admission medications   Medication Sig Start Date End Date Taking? Authorizing Provider  albuterol (PROAIR HFA) 108 (90 BASE) MCG/ACT inhaler Inhale 2 puffs into the lungs every 6 (six) hours as needed for wheezing. 07/31/14   Elsie Stain, MD  benzonatate (TESSALON) 200 MG capsule  TAKE ONE CAPSULE 3-4 TIMES DAILY AS NEEDED FOR COUGH 08/16/15   Tanda Rockers, MD  budesonide-formoterol [facility]) 160-4.5 MCG/ACT inhaler Inhale 2 puffs into the lungs 2 (two) times daily.    Historical Provider, MD  buPROPion (WELLBUTRIN SR) 200 MG 12 hr tablet Take 1 tablet (200 mg total) by mouth 2 (two) times daily. 06/28/15   Colon Branch, MD  calcium carbonate (OS-CAL) 600 MG TABS Take 600 mg by mouth daily.     Historical Provider, MD  cephALEXin (KEFLEX) 500 MG capsule Take 1 capsule (500 mg total) by mouth 2 (two) times daily. 02/19/16   Malvin Johns, MD  ciprofloxacin (CIPRO) 250 MG tablet Take 1 tablet (250 mg total) by mouth 2 (two) times daily. 11/24/15   Colon Branch, MD  diazepam (VALIUM) 5 MG tablet Take 5 mg by  mouth every 6 (six) hours as needed for anxiety. Reported on 08/04/2015    Historical Provider, MD  doxazosin (CARDURA) 4 MG tablet Take 1 tablet (4 mg total) by mouth at bedtime. 05/18/15   Colon Branch, MD  fluticasone [facility]) 50 MCG/ACT nasal spray Place 2 sprays into the nose 2 (two) times daily. 08/08/12   Elsie Stain, MD  HYDROcodone-acetaminophen (NORCO/VICODIN) 5-325 MG per tablet Take 1 tablet by mouth every 6 (six) hours as needed. 09/01/14   Colon Branch, MD  meclizine (ANTIVERT) 25 MG tablet Take 25 mg by mouth daily as needed (vertigo). Reported on 08/04/2015    Historical Provider, MD  montelukast (SINGULAIR) 10 MG tablet Take 1 tablet (10 mg total) by mouth daily. Reported on 08/04/2015 01/17/16   Mosie Lukes, MD  Multiple Vitamin (MULTIVITAMIN WITH MINERALS) TABS Take 1 tablet by mouth daily.    Historical Provider, MD  nystatin cream (MYCOSTATIN) Apply to affected area 2 times daily until resolution of rash. 11/20/15   Forde Dandy, MD  OMEPRAZOLE PO Take by mouth.    Historical Provider, MD  pantoprazole (PROTONIX) 40 MG tablet Take 1 tablet by mouth daily. 09/20/15   Historical Provider, MD  rOPINIRole (REQUIP) 1 MG tablet Take 1 tablet (1 mg total) by mouth at bedtime. 09/20/15   Colon Branch, MD  simvastatin (ZOCOR) 40 MG tablet Take 1 tablet (40 mg total) by mouth at bedtime. 04/16/15   Colon Branch, MD  tamsulosin (FLOMAX) 0.4 MG CAPS capsule Take 1 capsule (0.4 mg total) by mouth daily. 02/19/16   Malvin Johns, MD  triamterene-hydrochlorothiazide (MAXZIDE-25) 37.5-25 MG tablet Take 0.5 tablets by mouth daily. 12/03/15   Colon Branch, MD    ALLERGIES:  Allergies  Allergen Reactions  . Anticoagulant Compound     History of bleeding on the brain per daughter    SOCIAL HISTORY:  Social History  Substance Use Topics  . Smoking status: Former Smoker    Packs/day: 2.00    Years: 40.00    Types: Cigarettes    Quit date: 07/17/1978  . Smokeless tobacco: Never Used  . Alcohol use No     FAMILY HISTORY: Family History  Problem Relation Age of Onset  . Melanoma Brother   . CAD Neg Hx   . Diabetes Neg Hx     EXAM: BP 100/58   Pulse 67   Temp 97.6 F (36.4 C) (Rectal)   Resp 23   SpO2 97%  CONSTITUTIONAL: Alert and oriented and responds appropriately to questions. Elderly. Oriented x 3. HEAD: Normocephalic EYES: Conjunctivae clear, PERRL ENT: normal nose; no  rhinorrhea; moist mucous membranes NECK: Supple, no meningismus, no LAD  CARD: RRR; S1 and S2 appreciated; no murmurs, no clicks, no rubs, no gallops RESP: Normal chest excursion without splinting or tachypnea; breath sounds clear and equal bilaterally; no wheezes, no rhonchi, no rales, respiratory distress, speaking full sentences; he does have hypoxia on room air ABD/GI: Normal bowel sounds; non-distended; soft, non-tender, no rebound, no guarding, no peritoneal signs. Indwelling Foley catheter in place BACK:  The back appears normal and is non-tender to palpation, there is no CVA tenderness EXT: Normal ROM in all joints; non-tender to palpation; no edema; normal capillary refill; no cyanosis, no calf tenderness or swelling    SKIN: Normal color for age and race; warm; no rash NEURO: Moves all extremities equally, sensation to light touch intact diffusely, cranial nerves II through XII intact PSYCH: The patient's mood and manner are appropriate. Grooming and personal hygiene are appropriate.  MEDICAL DECISION MAKING: Patient here with hypotension, hypoxia. Have a nitrite positive urinary tract infection. Last urine culture grew Pseudomonas sensitive to cephalosporins. I feel he will need admission. Family states he has had sepsis in the past. Will obtain labs, cultures, give broad-spectrum antibiotics and IV fluids.  ED PROGRESS: Patient's labs show leukocytosis with left shift. Blood pressure is improving with IV hydration. Chest x-ray clear. We'll discuss with hospitalist for admission. Family comfortable  with this plan.    1:55 AM  Discussed patient's case with hospitalist, Dr. Loleta Books.  Recommend admission to step down, observation bed.  I will place holding orders per their request. Patient and family (if present) updated with plan. Care transferred to hospitalist service.  I reviewed all nursing notes, vitals, pertinent old records, EKGs, labs, imaging (as available).   EKG Interpretation  Date/Time:  Saturday February 19 2016 23:47:09 EDT Ventricular Rate:  64 PR Interval:    QRS Duration: 188 QT Interval:  488 QTC Calculation: 504 R Axis:   64 Text Interpretation:  Sinus rhythm Short PR interval Right bundle branch block Technically poor tracing No significant change since last tracing Confirmed by Angely Dietz,  DO, Gerldine Suleiman [phone]) on 02/19/2016 11:51:17 PM        EKG Interpretation  Date/Time:  Saturday February 19 2016 23:50:46 EDT Ventricular Rate:  64 PR Interval:    QRS Duration: 179 QT Interval:  445 QTC Calculation: 460 R Axis:   61 Text Interpretation:  Sinus rhythm Right bundle branch block No significant change since last tracing Confirmed by Torianne Laflam,  DO, Octavia Velador [phone]) on 02/19/2016 11:54:06 PM        I personally performed the services described in this documentation, which was scribed in my presence. The recorded information has been reviewed and is accurate.    [place], DO 02/20/16 0157

## 2016-02-19 NOTE — ED Triage Notes (Signed)
Pt arrives via EMS from home with c/o diaphoresis and hypotension upon waking this evening, pt treated today at [facility] for UTI/blood in catheter and started on keflex and flomax. Pt slept most of the day, woke this evening and sat down for dinner. He vomited x1, loss consciousness at that time per daughter. At that time she called 911. Upon EMS arrival he was cool, pale, and diaphoretic, weak radials. Initial BP 90/palp. Pt denies CP, warm and dry at this time.

## 2016-02-19 NOTE — ED Triage Notes (Signed)
Patient's daughter states Mr. Francia has been unable to urinate for the last three hours.  C/O lower abdominal pain due to problem.  Also states patient has had similar problems in the past r/t uti.  For the last two days, the patient has had generalized itching and has been taking benadryl.

## 2016-02-20 ENCOUNTER — Emergency Department (HOSPITAL_COMMUNITY): Payer: Medicare Other

## 2016-02-20 ENCOUNTER — Encounter (HOSPITAL_COMMUNITY): Payer: Self-pay | Admitting: Family Medicine

## 2016-02-20 DIAGNOSIS — R339 Retention of urine, unspecified: Secondary | ICD-10-CM | POA: Diagnosis present

## 2016-02-20 DIAGNOSIS — F0390 Unspecified dementia without behavioral disturbance: Secondary | ICD-10-CM | POA: Diagnosis present

## 2016-02-20 DIAGNOSIS — E785 Hyperlipidemia, unspecified: Secondary | ICD-10-CM | POA: Diagnosis present

## 2016-02-20 DIAGNOSIS — A419 Sepsis, unspecified organism: Secondary | ICD-10-CM | POA: Diagnosis present

## 2016-02-20 DIAGNOSIS — I959 Hypotension, unspecified: Secondary | ICD-10-CM

## 2016-02-20 DIAGNOSIS — Z85820 Personal history of malignant melanoma of skin: Secondary | ICD-10-CM | POA: Diagnosis not present

## 2016-02-20 DIAGNOSIS — Z87891 Personal history of nicotine dependence: Secondary | ICD-10-CM | POA: Diagnosis not present

## 2016-02-20 DIAGNOSIS — H9190 Unspecified hearing loss, unspecified ear: Secondary | ICD-10-CM | POA: Diagnosis present

## 2016-02-20 DIAGNOSIS — J449 Chronic obstructive pulmonary disease, unspecified: Secondary | ICD-10-CM | POA: Diagnosis present

## 2016-02-20 DIAGNOSIS — I129 Hypertensive chronic kidney disease with stage 1 through stage 4 chronic kidney disease, or unspecified chronic kidney disease: Secondary | ICD-10-CM | POA: Diagnosis present

## 2016-02-20 DIAGNOSIS — R0902 Hypoxemia: Secondary | ICD-10-CM | POA: Diagnosis present

## 2016-02-20 DIAGNOSIS — R55 Syncope and collapse: Secondary | ICD-10-CM | POA: Diagnosis present

## 2016-02-20 DIAGNOSIS — G2581 Restless legs syndrome: Secondary | ICD-10-CM | POA: Diagnosis present

## 2016-02-20 DIAGNOSIS — N390 Urinary tract infection, site not specified: Secondary | ICD-10-CM | POA: Diagnosis present

## 2016-02-20 DIAGNOSIS — N183 Chronic kidney disease, stage 3 (moderate): Secondary | ICD-10-CM | POA: Diagnosis present

## 2016-02-20 DIAGNOSIS — Z7951 Long term (current) use of inhaled steroids: Secondary | ICD-10-CM | POA: Diagnosis not present

## 2016-02-20 DIAGNOSIS — Z96652 Presence of left artificial knee joint: Secondary | ICD-10-CM | POA: Diagnosis present

## 2016-02-20 DIAGNOSIS — G4733 Obstructive sleep apnea (adult) (pediatric): Secondary | ICD-10-CM | POA: Diagnosis present

## 2016-02-20 DIAGNOSIS — B965 Pseudomonas (aeruginosa) (mallei) (pseudomallei) as the cause of diseases classified elsewhere: Secondary | ICD-10-CM | POA: Diagnosis present

## 2016-02-20 DIAGNOSIS — Z808 Family history of malignant neoplasm of other organs or systems: Secondary | ICD-10-CM | POA: Diagnosis not present

## 2016-02-20 LAB — CBC WITH DIFFERENTIAL/PLATELET
Basophils Absolute: 0 10*3/uL (ref 0.0–0.1)
Basophils Relative: 0 %
EOS PCT: 2 %
Eosinophils Absolute: 0.3 10*3/uL (ref 0.0–0.7)
HEMATOCRIT: 43.9 % (ref 39.0–52.0)
Hemoglobin: 14.2 g/dL (ref 13.0–17.0)
Lymphocytes Relative: 6 %
Lymphs Abs: 1.2 10*3/uL (ref 0.7–4.0)
MCH: 31.3 pg (ref 26.0–34.0)
MCHC: 32.3 g/dL (ref 30.0–36.0)
MCV: 96.9 fL (ref 78.0–100.0)
MONO ABS: 1.8 10*3/uL — AB (ref 0.1–1.0)
MONOS PCT: 10 %
NEUTROS PCT: 82 %
Neutro Abs: 14.8 10*3/uL — ABNORMAL HIGH (ref 1.7–7.7)
PLATELETS: 223 10*3/uL (ref 150–400)
RBC: 4.53 MIL/uL (ref 4.22–5.81)
RDW: 13.3 % (ref 11.5–15.5)
WBC: 18 10*3/uL — AB (ref 4.0–10.5)

## 2016-02-20 LAB — I-STAT CHEM 8, ED
BUN: 30 mg/dL — AB (ref 6–20)
CALCIUM ION: 1.11 mmol/L — AB (ref 1.12–1.23)
CHLORIDE: 102 mmol/L (ref 101–111)
Creatinine, Ser: 1.8 mg/dL — ABNORMAL HIGH (ref 0.61–1.24)
GLUCOSE: 110 mg/dL — AB (ref 65–99)
HCT: 43 % (ref 39.0–52.0)
Hemoglobin: 14.6 g/dL (ref 13.0–17.0)
POTASSIUM: 3.8 mmol/L (ref 3.5–5.1)
Sodium: 143 mmol/L (ref 135–145)
TCO2: 29 mmol/L (ref 0–100)

## 2016-02-20 LAB — URINE MICROSCOPIC-ADD ON

## 2016-02-20 LAB — CBC
HEMATOCRIT: 41.4 % (ref 39.0–52.0)
Hemoglobin: 13.1 g/dL (ref 13.0–17.0)
MCH: 30.5 pg (ref 26.0–34.0)
MCHC: 31.6 g/dL (ref 30.0–36.0)
MCV: 96.3 fL (ref 78.0–100.0)
PLATELETS: 212 10*3/uL (ref 150–400)
RBC: 4.3 MIL/uL (ref 4.22–5.81)
RDW: 13.4 % (ref 11.5–15.5)
WBC: 16.6 10*3/uL — AB (ref 4.0–10.5)

## 2016-02-20 LAB — BASIC METABOLIC PANEL
ANION GAP: 8 (ref 5–15)
BUN: 22 mg/dL — AB (ref 6–20)
CO2: 23 mmol/L (ref 22–32)
Calcium: 8.5 mg/dL — ABNORMAL LOW (ref 8.9–10.3)
Chloride: 109 mmol/L (ref 101–111)
Creatinine, Ser: 1.6 mg/dL — ABNORMAL HIGH (ref 0.61–1.24)
GFR calc Af Amer: 43 mL/min — ABNORMAL LOW (ref >60)
GFR calc non Af Amer: 37 mL/min — ABNORMAL LOW (ref >60)
GLUCOSE: 100 mg/dL — AB (ref 65–99)
POTASSIUM: 3.5 mmol/L (ref 3.5–5.1)
Sodium: 140 mmol/L (ref 135–145)

## 2016-02-20 LAB — COMPREHENSIVE METABOLIC PANEL
ALK PHOS: 66 U/L (ref 38–126)
ALT: 6 U/L — AB (ref 17–63)
ANION GAP: 7 (ref 5–15)
AST: 21 U/L (ref 15–41)
Albumin: 3.4 g/dL — ABNORMAL LOW (ref 3.5–5.0)
BUN: 24 mg/dL — ABNORMAL HIGH (ref 6–20)
CALCIUM: 9.2 mg/dL (ref 8.9–10.3)
CO2: 25 mmol/L (ref 22–32)
CREATININE: 1.85 mg/dL — AB (ref 0.61–1.24)
Chloride: 108 mmol/L (ref 101–111)
GFR, EST AFRICAN AMERICAN: 36 mL/min — AB (ref >60)
GFR, EST NON AFRICAN AMERICAN: 31 mL/min — AB (ref >60)
Glucose, Bld: 113 mg/dL — ABNORMAL HIGH (ref 65–99)
Potassium: 3.7 mmol/L (ref 3.5–5.1)
SODIUM: 140 mmol/L (ref 135–145)
TOTAL PROTEIN: 6 g/dL — AB (ref 6.5–8.1)
Total Bilirubin: 0.8 mg/dL (ref 0.3–1.2)

## 2016-02-20 LAB — URINALYSIS, ROUTINE W REFLEX MICROSCOPIC
BILIRUBIN URINE: NEGATIVE
GLUCOSE, UA: NEGATIVE mg/dL
KETONES UR: NEGATIVE mg/dL
Nitrite: NEGATIVE
Protein, ur: 30 mg/dL — AB
Specific Gravity, Urine: 1.019 (ref 1.005–1.030)
pH: 7 (ref 5.0–8.0)

## 2016-02-20 LAB — I-STAT TROPONIN, ED: Troponin i, poc: 0.01 ng/mL (ref 0.00–0.08)

## 2016-02-20 LAB — I-STAT CG4 LACTIC ACID, ED: LACTIC ACID, VENOUS: 1.13 mmol/L (ref 0.5–1.9)

## 2016-02-20 LAB — MRSA PCR SCREENING: MRSA by PCR: NEGATIVE

## 2016-02-20 MED ORDER — ACETAMINOPHEN 325 MG PO TABS: 650.0000 mg | ORAL_TABLET | Freq: Four times a day (QID) | ORAL | Status: DC | PRN

## 2016-02-20 MED ORDER — ROPINIROLE HCL 1 MG PO TABS
1.0000 mg | ORAL_TABLET | Freq: Every day | ORAL | Status: DC
  Administered 2016-02-20 – 2016-02-21 (×2): 1 mg via ORAL
  Filled 2016-02-20 (×2): qty 1

## 2016-02-20 MED ORDER — SODIUM CHLORIDE 0.9 % IV SOLN
INTRAVENOUS | Status: AC
  Administered 2016-02-20 (×3): via INTRAVENOUS

## 2016-02-20 MED ORDER — BUPROPION HCL ER (SR) 100 MG PO TB12
200.0000 mg | ORAL_TABLET | Freq: Two times a day (BID) | ORAL | Status: DC
  Administered 2016-02-20 – 2016-02-22 (×5): 200 mg via ORAL
  Filled 2016-02-20 (×8): qty 2

## 2016-02-20 MED ORDER — ALBUTEROL SULFATE (2.5 MG/3ML) 0.083% IN NEBU: 2.5000 mg | INHALATION_SOLUTION | RESPIRATORY_TRACT | Status: DC | PRN

## 2016-02-20 MED ORDER — ACETAMINOPHEN 650 MG RE SUPP: 650.0000 mg | Freq: Four times a day (QID) | RECTAL | Status: DC | PRN

## 2016-02-20 MED ORDER — MONTELUKAST SODIUM 10 MG PO TABS
10.0000 mg | ORAL_TABLET | Freq: Every day | ORAL | Status: DC
  Administered 2016-02-20 – 2016-02-22 (×3): 10 mg via ORAL
  Filled 2016-02-20 (×3): qty 1

## 2016-02-20 MED ORDER — PANTOPRAZOLE SODIUM 40 MG PO TBEC
40.0000 mg | DELAYED_RELEASE_TABLET | Freq: Every day | ORAL | Status: DC
  Administered 2016-02-21 – 2016-02-22 (×2): 40 mg via ORAL
  Filled 2016-02-20 (×3): qty 1

## 2016-02-20 MED ORDER — ONDANSETRON HCL 4 MG PO TABS: 4.0000 mg | ORAL_TABLET | Freq: Four times a day (QID) | ORAL | Status: DC | PRN

## 2016-02-20 MED ORDER — SODIUM CHLORIDE 0.9 % IV BOLUS (SEPSIS)
1000.0000 mL | Freq: Once | INTRAVENOUS | Status: AC
  Administered 2016-02-20: 1000 mL via INTRAVENOUS

## 2016-02-20 MED ORDER — VANCOMYCIN HCL IN DEXTROSE 1-5 GM/200ML-% IV SOLN
1000.0000 mg | Freq: Once | INTRAVENOUS | Status: AC
  Administered 2016-02-20: 1000 mg via INTRAVENOUS
  Filled 2016-02-20: qty 200

## 2016-02-20 MED ORDER — ONDANSETRON HCL 4 MG/2ML IJ SOLN: 4.0000 mg | Freq: Four times a day (QID) | INTRAMUSCULAR | Status: DC | PRN

## 2016-02-20 MED ORDER — TAMSULOSIN HCL 0.4 MG PO CAPS
0.4000 mg | ORAL_CAPSULE | Freq: Every day | ORAL | Status: DC
  Administered 2016-02-20 – 2016-02-22 (×3): 0.4 mg via ORAL
  Filled 2016-02-20 (×3): qty 1

## 2016-02-20 MED ORDER — BISACODYL 10 MG RE SUPP: 10.0000 mg | Freq: Every day | RECTAL | Status: DC | PRN

## 2016-02-20 MED ORDER — DEXTROSE 5 % IV SOLN
2.0000 g | Freq: Once | INTRAVENOUS | Status: AC
  Administered 2016-02-20: 2 g via INTRAVENOUS
  Filled 2016-02-20: qty 2

## 2016-02-20 MED ORDER — MOMETASONE FURO-FORMOTEROL FUM 200-5 MCG/ACT IN AERO
2.0000 | INHALATION_SPRAY | Freq: Two times a day (BID) | RESPIRATORY_TRACT | Status: DC
  Administered 2016-02-20 – 2016-02-22 (×3): 2 via RESPIRATORY_TRACT
  Filled 2016-02-20 (×2): qty 8.8

## 2016-02-20 MED ORDER — SODIUM CHLORIDE 0.9 % IV BOLUS (SEPSIS)
1000.0000 mL | Freq: Once | INTRAVENOUS | Status: AC
Start: 2016-02-20 — End: 2016-02-20
  Administered 2016-02-20: 1000 mL via INTRAVENOUS

## 2016-02-20 MED ORDER — SIMVASTATIN 40 MG PO TABS
40.0000 mg | ORAL_TABLET | Freq: Every day | ORAL | Status: DC
  Administered 2016-02-20 – 2016-02-21 (×2): 40 mg via ORAL
  Filled 2016-02-20 (×2): qty 1

## 2016-02-20 MED ORDER — DEXTROSE 5 % IV SOLN
1.0000 g | INTRAVENOUS | Status: DC
  Administered 2016-02-20 – 2016-02-21 (×2): 1 g via INTRAVENOUS
  Filled 2016-02-20 (×2): qty 1

## 2016-02-20 MED ORDER — CHLORHEXIDINE GLUCONATE 0.12 % MT SOLN
15.0000 mL | Freq: Two times a day (BID) | OROMUCOSAL | Status: DC
  Administered 2016-02-20 – 2016-02-22 (×4): 15 mL via OROMUCOSAL
  Filled 2016-02-20 (×4): qty 15

## 2016-02-20 MED ORDER — SODIUM CHLORIDE 0.9 % IV BOLUS (SEPSIS)
250.0000 mL | Freq: Once | INTRAVENOUS | Status: AC
  Administered 2016-02-20: 250 mL via INTRAVENOUS

## 2016-02-20 MED ORDER — CETYLPYRIDINIUM CHLORIDE 0.05 % MT LIQD
7.0000 mL | Freq: Two times a day (BID) | OROMUCOSAL | Status: DC
  Administered 2016-02-20 – 2016-02-22 (×2): 7 mL via OROMUCOSAL

## 2016-02-20 NOTE — ED Notes (Signed)
Pt has a foley cath placed at med center earlier today

## 2016-02-20 NOTE — Progress Notes (Signed)
Patient admitted after midnight, please see H&P.  Of note, patient is very hard of hearing  UTI with sepsis  source urine. Organism unknown. Patient meets sepsis criteria given tachypnea, leukocytosis, with evidence of organ dysfunction (hypotension, syncope).  Lactate normal.  Antibiotics delivered in the ED.                        -Start targeted antibiotics with ceftazidime given hx of Pseudomonas urinary culture                        -Repeat renal function and complete blood count in AM   Urinary retention:  Was on a medicine for prostate years ago, not sure what.  Given infection, would like to avoid foley. -Tamsulosin 0.4 mg  -Remove foley -If fails voiding trial over next 8-12 hours, could replace foley as needed -has seen Dr. Karsten Ro in the past (urology)   COPD:  -Continue inhalers   OSA:  Not on CPAP for several years.  RLS:  -Continue ropinirole   Mood:  -Continue bupropion  HTN:  -Hold triamterene-HCTZ given BP -Hold doxazosin while on tamsulosin -Continue statin  PT Eval ordered tx to med surge bed  Eulogio Bear DO

## 2016-02-20 NOTE — ED Notes (Signed)
Removed foley per Dr. Loleta Books, pt to attempt to urinate independently. If unable, okay to replace foley.

## 2016-02-20 NOTE — Evaluation (Signed)
Physical Therapy Evaluation Patient Details Name: Micheal Frank MRN: [id number] DOB: Aug 27, 1925 Today's Date: 02/20/2016   History of Present Illness  82 y.o. male with a pmh significant for COPD, CKD, and hearing loss who presents with sepsis secondary to UTI.    Clinical Impression  Pt admitted with above diagnosis. Pt currently with functional limitations due to the deficits listed below (see PT Problem List). During session pt ambulated to and from restroom and had a normal BM. He complained of pain while urinating, stating that this was new pain as of today. Pt completed all PT activities on room air; was on O2 prior to session. O2 sats dropped to 83% while pt was using the restroom, but this was likely due to excessive squeezing of bathroom rail; all other O2 sats remained at or above 95%. Pt will benefit from skilled PT to increase their independence and safety with mobility to allow discharge to the venue listed below.       Follow Up Recommendations Home health PT    Equipment Recommendations  3in1 (PT)    Recommendations for Other Services OT consult     Precautions / Restrictions Precautions Precautions: Fall Restrictions Weight Bearing Restrictions: No      Mobility  Bed Mobility Overal bed mobility: Needs Assistance Bed Mobility: Sidelying to Sit   Sidelying to sit: Supervision       General bed mobility comments: Pt utilized bed rails to pull up from sidelying; required cues for LE positioning.  Transfers Overall transfer level: Needs assistance Equipment used: Rolling walker (2 wheeled) Transfers: Sit to/from Stand Sit to Stand: Min guard         General transfer comment: Pt required cues for hand placement when powering up/down from bed/toilet.  Ambulation/Gait Ambulation/Gait assistance: Min guard Ambulation Distance (Feet): 20 Feet Assistive device: Rolling walker (2 wheeled) Gait Pattern/deviations: Decreased step length - right;Decreased  step length - left;Step-to pattern;Shuffle;Trunk flexed   Gait velocity interpretation: Below normal speed for age/gender General Gait Details: Pt able to ambulated on room air without O2 desaturation.  Stairs            Wheelchair Mobility    Modified Rankin (Stroke Patients Only)       Balance                                             Pertinent Vitals/Pain Pain Assessment: No/denies pain    Home Living Family/patient expects to be discharged to:: Private residence Living Arrangements: Children;Other relatives Available Help at Discharge: Family Type of Home: House Home Access: Ramped entrance     Home Layout: One level;Full bath on main level Home Equipment: Cane - single point;Walker - standard Additional Comments: Uses RW to transition from sit-to-stand, but uses cane to ambulate.    Prior Function Level of Independence: Independent with assistive device(s)         Comments: Pt states that he bathes and dresses himself.     Hand Dominance        Extremity/Trunk Assessment   Upper Extremity Assessment: Overall WFL for tasks assessed           Lower Extremity Assessment: Generalized weakness (dependent on UEs to push/pull during transfers)      Cervical / Trunk Assessment: Kyphotic  Communication   Communication: HOH  Cognition Arousal/Alertness: Awake/alert Behavior During Therapy: WFL for tasks  assessed/performed Overall Cognitive Status: Within Functional Limits for tasks assessed                      General Comments General comments (skin integrity, edema, etc.): Pt appeared to have a sacral rash (raised, red, splotchy).    Exercises        Assessment/Plan    PT Assessment Patient needs continued PT services  PT Diagnosis Generalized weakness   PT Problem List Decreased strength;Decreased activity tolerance;Decreased balance;Decreased mobility;Decreased coordination;Decreased skin integrity  PT  Treatment Interventions DME instruction;Gait training;Functional mobility training;Therapeutic activities;Balance training;Therapeutic exercise;Neuromuscular re-education;Patient/family education   PT Goals (Current goals can be found in the Care Plan section) Acute Rehab PT Goals Patient Stated Goal: Not stated PT Goal Formulation: With patient/family Time For Goal Achievement: 03/05/16 Potential to Achieve Goals: Good    Frequency Min 3X/week   Barriers to discharge        Co-evaluation               End of Session Equipment Utilized During Treatment: Gait belt Activity Tolerance: Patient tolerated treatment well Patient left: in chair;with nursing/sitter in room;with family/visitor present (Pt left in wheelchair to be transported to 5W.) Nurse Communication: Mobility status         Time: 1331-1407 PT Time Calculation (min) (ACUTE ONLY): 36 min   Charges:         PT G Codes:        Chaney Malling, SPT Acute Rehabilitation Services  Chaney Malling 02/20/2016, 2:58 PM

## 2016-02-20 NOTE — Progress Notes (Signed)
Report attempted 

## 2016-02-20 NOTE — ED Notes (Signed)
Danford MD at bedside. 

## 2016-02-20 NOTE — H&P (Signed)
History and Physical  Patient Name: Micheal Frank     [id number]    DOB: 11/17/1925    DOA: 02/19/2016 PCP: Kathlene November, MD   Patient coming from: Home  Chief Complaint: Pre-syncope  HPI: Micheal Frank is a 82 y.o. male with a past medical history significant for COPD, CKD, and hearing loss who presents with syncopal episode and urinary retention.  The patient lives with his daughter and was in his usual state of health until the past two days when he had itching that led him to take diphenhydramine several times, that led to urinary retention.  Yesterday morning after taking diphenhydramine he couldn't urinate, so his daughters took him [facility] the [facility].  There a foley was placed with 500 cc urine with pyuria and nitrites, so he was started on cephalexin and discharged with a foley and tamsulosin.  When he got home he took a nap, and when he woke up from the nap he was sitting with daughters when he became weak and dizzy and then started to slump out of his chair.  He was unresponsive so daughters called 9-1-1 who arrived to find him diaphoretic, pale, lethargic and with SBP 88/palp, and so brought him to the ER.  ED course: -Afebrile, heart rate 60s, respirations fast, slightly hypoxic intermittently, BP 0000000 and [id number] systolic -Na [id number], K 3.7, Cr 1.85 (baseline 1.7), WBC 18K, Hgb 14, lactate normal -Urine again showed pyuria and so he was given cefepime and TRH were asked to evaluate for admission         Review of Systems:  Review of Systems  Constitutional: Negative for chills, fever and weight loss.  HENT: Negative.   Eyes: Negative.   Respiratory: Negative for cough, sputum production, shortness of breath and wheezing.   Cardiovascular: Negative for chest pain, palpitations, orthopnea, leg swelling and PND.  Gastrointestinal: Negative for abdominal pain, blood in stool, constipation, diarrhea, nausea and vomiting.  Genitourinary: Negative for dysuria, flank pain, frequency,  hematuria and urgency.       Urinary hesitancy  Musculoskeletal: Negative.   Skin: Negative.   Neurological: Positive for loss of consciousness and weakness. Negative for dizziness, tingling, tremors, sensory change, speech change, focal weakness and seizures.  Endo/Heme/Allergies: Negative.   Psychiatric/Behavioral: Negative.      Past Medical History:  Diagnosis Date  . Arthritis    left leg also has swelling  . Asthma   . Blood dyscrasia    pt CAN NOT have any blood thinners d/t hx brain bleed  . Chronic kidney disease, stage III (moderate) 01/20/2013  . Complication of anesthesia    confusion with anesthesia  . Constipation    miralax prn  . COPD (chronic obstructive pulmonary disease) (HCC)    emphysema  . Depression    takes wellbutrin bid  . Epidural abscess   . Gastric ulcer   . History of blood clots   . Hyperlipidemia   . Melanoma ([place])   . Peripheral neuropathy ([place])   . Pneumonia    as a child and in 1952;had a pneumonia vaccine couple of years ago  . Prosthetic joint infection ([place])   . S/P right heart catheterization    at least 37yrs  . Shingles   . Short-term memory loss   . Shortness of breath    with exertion  . Sleep apnea    has CPAP but doesn't use it;sleep study done at least 74yrs ago  . Staphylococcus aureus bacteremia with sepsis ([place])   .  Subdural hematoma ([place]) 03/30/11  . Urinary frequency    wears depends    Past Surgical History:  Procedure Laterality Date  . epidural abscess drainage  12/2010  . 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 EYE SURGERY     bil cataract surgery  . FILTERING PROCEDURE     filter placed in neck d/t blood  clot  . HERNIA REPAIR     25+yrs ago  . KNEE ARTHROSCOPY    . SKIN BIOPSY     melanoma on head   . TOTAL KNEE ARTHROPLASTY     x 2 left knee 2003/2012  . TOTAL KNEE REVISION  05/23/2011   Procedure: TOTAL KNEE REVISION;  Surgeon: Meredith Pel;  Location: [facility];   Service: Orthopedics;  Laterality: Left;  LEFT KNEE REMOVAL OF SPACER, POSSIBLE REIMPLANTATION OF REVISION TKA VERSES REPLACEMENT ANTIBIOTIC SPACER    Social History: Patient lives with his daughter.  Patient walks with a cane.  Independent with all ADLs.  Wears Depends.  Very HOH and mild dementia.  Remote former smoker.  From [place], worked as an Chief Financial Officer for [employer].    Allergies  Allergen Reactions  . Anticoagulant Compound     History of bleeding on the brain per daughter    Family history: family history includes Melanoma in his brother.  Prior to Admission medications   Medication Sig Start Date End Date Taking? Authorizing Provider  albuterol (PROAIR HFA) 108 (90 BASE) MCG/ACT inhaler Inhale 2 puffs into the lungs every 6 (six) hours as needed for wheezing. 07/31/14   Elsie Stain, MD  benzonatate (TESSALON) 200 MG capsule TAKE ONE CAPSULE 3-4 TIMES DAILY AS NEEDED FOR COUGH 08/16/15   Tanda Rockers, MD  budesonide-formoterol [facility]) 160-4.5 MCG/ACT inhaler Inhale 2 puffs into the lungs 2 (two) times daily.    Historical Provider, MD  buPROPion (WELLBUTRIN SR) 200 MG 12 hr tablet Take 1 tablet (200 mg total) by mouth 2 (two) times daily. 06/28/15   Colon Branch, MD  calcium carbonate (OS-CAL) 600 MG TABS Take 600 mg by mouth daily.     Historical Provider, MD  cephALEXin (KEFLEX) 500 MG capsule Take 1 capsule (500 mg total) by mouth 2 (two) times daily. 02/19/16   Malvin Johns, MD  ciprofloxacin (CIPRO) 250 MG tablet Take 1 tablet (250 mg total) by mouth 2 (two) times daily. 11/24/15   Colon Branch, MD  diazepam (VALIUM) 5 MG tablet Take 5 mg by mouth every 6 (six) hours as needed for anxiety. Reported on 08/04/2015    Historical Provider, MD  doxazosin (CARDURA) 4 MG tablet Take 1 tablet (4 mg total) by mouth at bedtime. 05/18/15   Colon Branch, MD  fluticasone [facility]) 50 MCG/ACT nasal spray Place 2 sprays into the nose 2 (two) times daily. 08/08/12   Elsie Stain, MD    HYDROcodone-acetaminophen (NORCO/VICODIN) 5-325 MG per tablet Take 1 tablet by mouth every 6 (six) hours as needed. 09/01/14   Colon Branch, MD  meclizine (ANTIVERT) 25 MG tablet Take 25 mg by mouth daily as needed (vertigo). Reported on 08/04/2015    Historical Provider, MD  montelukast (SINGULAIR) 10 MG tablet Take 1 tablet (10 mg total) by mouth daily. Reported on 08/04/2015 01/17/16   Mosie Lukes, MD  Multiple Vitamin (MULTIVITAMIN WITH MINERALS) TABS Take 1 tablet by mouth daily.    Historical Provider, MD  nystatin cream (MYCOSTATIN)  Apply to affected area 2 times daily until resolution of rash. 11/20/15   Forde Dandy, MD  OMEPRAZOLE PO Take by mouth.    Historical Provider, MD  pantoprazole (PROTONIX) 40 MG tablet Take 1 tablet by mouth daily. 09/20/15   Historical Provider, MD  rOPINIRole (REQUIP) 1 MG tablet Take 1 tablet (1 mg total) by mouth at bedtime. 09/20/15   Colon Branch, MD  simvastatin (ZOCOR) 40 MG tablet Take 1 tablet (40 mg total) by mouth at bedtime. 04/16/15   Colon Branch, MD  tamsulosin (FLOMAX) 0.4 MG CAPS capsule Take 1 capsule (0.4 mg total) by mouth daily. 02/19/16   Malvin Johns, MD  triamterene-hydrochlorothiazide (MAXZIDE-25) 37.5-25 MG tablet Take 0.5 tablets by mouth daily. 12/03/15   Colon Branch, MD       Physical Exam: BP 98/60   Pulse 64   Temp 97.6 F (36.4 C) (Rectal)   Resp 18   SpO2 94%  General appearance: Well-developed, elderly adult male, alert and in no acute distress.  HOH.  Oriented to place.  Somewhat inattentive.   Eyes: Anicteric, conjunctiva pink, lids and lashes normal.    Left pupil deformed, right reactive. ENT: No nasal deformity, discharge, or epistaxis.  OP moist without lesions.   Skin: Warm and dry.  No jaundice.  No suspicious rashes or lesions. Cardiac: RRR, nl S1-S2, no murmurs appreciated.  Capillary refill is brisk.  No LE edema.  Radial and DP pulses 2+ and symmetric. Respiratory: Normal respiratory rate and rhythm.  CTAB without rales  or wheezes. Abdomen: Abdomen soft without rigidity.  No TTP. No ascites, distension.   MSK: No deformities or effusions. Neuro: Hard of hearing.  Sensorium intact and responding to questions, attention normal.  Speech is fluent.  Moves all extremities equally and with normal coordination.    Psych: Behavior appropriate.  Affect normal.  No evidence of aural or visual hallucinations or delusions.       Labs on Admission:  I have personally reviewed the following studies: The metabolic panel shows chronic kidney disease. The complete blood count shows leukocytosis. UA shows pyuria. Lactate normal Troponin negative.   Radiological Exams on Admission: Personally reviewed: Dg Chest 2 View  Result Date: 02/20/2016 CLINICAL DATA:  82 year old male with hypoxia EXAM: CHEST  2 VIEW COMPARISON:  Chest radiograph dated 08/04/2015 FINDINGS: Two views of the chest demonstrate emphysematous changes of the lungs with interstitial coarsening. Left lung base density, similar to prior study, likely atelectatic changes/scarring. No focal consolidation, pleural effusion, or pneumothorax. There is osteopenia with degenerative changes of the spine. No acute fracture. IMPRESSION: No acute cardiopulmonary process. Electronically Signed   By: Anner Crete M.D.   On: 02/20/2016 00:56    EKG: Independently reviewed. Rate 64, QTc normal.  Old RBBB.    Assessment/Plan 1. UTI with early sepsis:  Suspected source urine. Organism unknown. Patient meets sepsis criteria given tachypnea, leukocytosis, with evidence of organ dysfunction (hypotension, syncope).  Lactate normal.  Antibiotics delivered in the ED.    -Sepsis bundle utilized:  -Blood and urine cultures drawn  -30 ml/kg bolus given in ED given ER reported SBP < 90  -Stop cephalexin  -Start targeted antibiotics with ceftazidime given hx of Pseudomonas urinary culture   -Repeat renal function and complete blood count in AM   2. Urinary retention:   Was on a medicine for prostate years ago, not sure what.  Given infection, would like to avoid foley. -Tamsulosin 0.4 mg  -Remove  foley -If fails voiding trial over next 8-12 hours, could replace foley as needed  3. COPD:  -Continue inhalers  4. OSA:  Not on CPAP for several years.  5. RLS:  -Continue ropinirole  6. Mood:  -Continue bupropion  7. HTN:  -Hold triamterene-HCTZ given BP -Hold doxazosin while on tamsulosin -Continue statin     DVT prophylaxis: SCDs  Code Status: FULL  Family Communication: Daughters present at bedside  Disposition Plan: Anticipate fluids and antibiotics and follow urine culture.   Consults called: None Admission status: INPATIENT   Medical decision making: Patient seen at 2:00 AM on 02/20/2016.  The patient was discussed with Dr. Leonides Schanz.  What exists of the patient's chart was reviewed in depth.  Clinical condition: stable.          Edwin Dada [facility] Pager [phone]

## 2016-02-20 NOTE — Progress Notes (Signed)
Pharmacy Antibiotic Note  Micheal Frank is a 82 y.o. male admitted on 02/19/2016 with urosepsis.  Pharmacy has been consulted for [facility] dosing.  Cefepime 2 g IV given in ED at 0100  Plan: Fortaz 1 g IV q24h  Height: 5\' 9"  (175.3 cm) Weight: 161 lb 6 oz (73.2 kg) IBW/kg (Calculated) : 70.7  Temp (24hrs), Avg:97.7 F (36.5 C), Min:97.6 F (36.4 C), Max:98 F (36.7 C)   Recent Labs Lab 02/20/16 0000 02/20/16 0007 02/20/16 0008  WBC 18.0*  --   --   CREATININE 1.85* 1.80*  --   LATICACIDVEN  --   --  1.13    Estimated Creatinine Clearance: 28.4 mL/min (by C-G formula based on SCr of 1.8 mg/dL).    Allergies  Allergen Reactions  . Anticoagulant Compound     History of bleeding on the brain per daughter    Caryl Pina 02/20/2016 3:09 AM

## 2016-02-20 NOTE — Progress Notes (Signed)
Report attemptedX2. Nurse to call back

## 2016-02-21 DIAGNOSIS — N390 Urinary tract infection, site not specified: Secondary | ICD-10-CM

## 2016-02-21 DIAGNOSIS — A419 Sepsis, unspecified organism: Principal | ICD-10-CM

## 2016-02-21 LAB — CBC
HCT: 39.6 % (ref 39.0–52.0)
HEMOGLOBIN: 13 g/dL (ref 13.0–17.0)
MCH: 31.2 pg (ref 26.0–34.0)
MCHC: 32.8 g/dL (ref 30.0–36.0)
MCV: 95 fL (ref 78.0–100.0)
Platelets: 199 10*3/uL (ref 150–400)
RBC: 4.17 MIL/uL — ABNORMAL LOW (ref 4.22–5.81)
RDW: 13.2 % (ref 11.5–15.5)
WBC: 11.2 10*3/uL — AB (ref 4.0–10.5)

## 2016-02-21 LAB — BASIC METABOLIC PANEL
ANION GAP: 6 (ref 5–15)
BUN: 16 mg/dL (ref 6–20)
CHLORIDE: 111 mmol/L (ref 101–111)
CO2: 22 mmol/L (ref 22–32)
CREATININE: 1.57 mg/dL — AB (ref 0.61–1.24)
Calcium: 8.5 mg/dL — ABNORMAL LOW (ref 8.9–10.3)
GFR calc non Af Amer: 38 mL/min — ABNORMAL LOW (ref >60)
GFR, EST AFRICAN AMERICAN: 44 mL/min — AB (ref >60)
Glucose, Bld: 70 mg/dL (ref 65–99)
Potassium: 3.3 mmol/L — ABNORMAL LOW (ref 3.5–5.1)
SODIUM: 139 mmol/L (ref 135–145)

## 2016-02-21 MED ORDER — URELLE 81 MG PO TABS
1.0000 | ORAL_TABLET | Freq: Four times a day (QID) | ORAL | Status: DC | PRN
  Administered 2016-02-21: 81 mg via ORAL
  Filled 2016-02-21 (×2): qty 1

## 2016-02-21 MED ORDER — POTASSIUM CHLORIDE CRYS ER 20 MEQ PO TBCR
20.0000 meq | EXTENDED_RELEASE_TABLET | Freq: Once | ORAL | Status: AC
  Administered 2016-02-21: 20 meq via ORAL
  Filled 2016-02-21: qty 1

## 2016-02-21 MED ORDER — DIPHENHYDRAMINE HCL 25 MG PO CAPS: 25.0000 mg | ORAL_CAPSULE | ORAL | Status: DC | PRN

## 2016-02-21 MED ORDER — HYDROCORTISONE 0.5 % EX CREA
TOPICAL_CREAM | Freq: Two times a day (BID) | CUTANEOUS | Status: DC
  Administered 2016-02-21: 22:00:00 via TOPICAL
  Administered 2016-02-22: 1 via TOPICAL
  Filled 2016-02-21: qty 28.35

## 2016-02-21 MED ORDER — POTASSIUM CHLORIDE CRYS ER 20 MEQ PO TBCR
40.0000 meq | EXTENDED_RELEASE_TABLET | Freq: Once | ORAL | Status: AC
  Administered 2016-02-21: 40 meq via ORAL
  Filled 2016-02-21: qty 2

## 2016-02-21 MED ORDER — DEXTROSE 5 % IV SOLN
1.0000 g | Freq: Two times a day (BID) | INTRAVENOUS | Status: DC
  Administered 2016-02-21 – 2016-02-22 (×2): 1 g via INTRAVENOUS
  Filled 2016-02-21 (×3): qty 1

## 2016-02-21 MED ORDER — PHENAZOPYRIDINE HCL 100 MG PO TABS: 100.0000 mg | ORAL_TABLET | Freq: Three times a day (TID) | ORAL | Status: DC | PRN

## 2016-02-21 MED ORDER — OXYCODONE-ACETAMINOPHEN 5-325 MG PO TABS
1.0000 | ORAL_TABLET | Freq: Four times a day (QID) | ORAL | Status: DC | PRN
  Administered 2016-02-21: 1 via ORAL
  Filled 2016-02-21 (×2): qty 1

## 2016-02-21 MED ORDER — DIPHENHYDRAMINE HCL 25 MG PO CAPS
50.0000 mg | ORAL_CAPSULE | Freq: Once | ORAL | Status: AC
  Administered 2016-02-21: 50 mg via ORAL
  Filled 2016-02-21: qty 2

## 2016-02-21 NOTE — Progress Notes (Signed)
Pt c/o of pain in penis at 0150 am same as on admission, he refuses tylenol for pain saying it does not help. MD paged at 0230, no response. MD paged again at 0250. New orders placed. Pt urinated, bladder scan at 0100 indicates 200 ml residual. After pain medicine administered patient resting.

## 2016-02-21 NOTE — Progress Notes (Signed)
Pt constantly pulling off condom catheter while turning. Few incontinent episodes that resulted in progressing of MASD on male genitalia. Condom catheter reapplied.

## 2016-02-21 NOTE — Progress Notes (Signed)
Potassium 3.3. MD notified at [phone].

## 2016-02-21 NOTE — Care Management Note (Signed)
Case Management Note  Patient Details  Name: Micheal Frank MRN: [id number] Date of Birth: 1925/11/23  Subjective/Objective:                 Patient admitted with sepsis 2/2 UTI. Lives at home with daughter. IV Abx for next few days, then anticipate DC to home.   Action/Plan:  Will need HH PT at DC.   Expected Discharge Date:                  Expected Discharge Plan:  [place]  In-House Referral:     Discharge planning Services  CM Consult  Post Acute Care Choice:    Choice offered to:     DME Arranged:    DME Agency:     HH Arranged:    [place] Agency:     Status of Service:  In process, will continue to follow  If discussed at Long Length of Stay Meetings, dates discussed:    Additional Comments:  Micheal Collet, RN 02/21/2016, 1:08 PM

## 2016-02-21 NOTE — Progress Notes (Addendum)
Pt c/o of itching on bilateral inner lower extremities, rash and redness noted. MD paged and order for Benadryl placed.

## 2016-02-21 NOTE — Progress Notes (Signed)
Occupational Therapy Evaluation Patient Details Name: Micheal Frank MRN: [id number] DOB: 1926/06/17 Today's Date: 02/21/2016    History of Present Illness 82 y.o. male with a pmh significant for COPD, CKD, and hearing loss who presents with sepsis secondary to UTI.   Clinical Impression   PTA, pt mod I with ADL and mobility @ RW level. Reports 2 falls in last month. Family reports he was alone durig the day. Pt currently requires min A for ADL and mobility and will benefit from T to maximize functional level of independence and facilitate safe D/C home with HHOT. Pt/family in agreement.     Follow Up Recommendations  Home health OT;Supervision/Assistance - 24 hour    Equipment Recommendations  None recommended by OT    Recommendations for Other Services       Precautions / Restrictions Precautions Precautions: Fall Restrictions Weight Bearing Restrictions: No      Mobility Bed Mobility Overal bed mobility: Needs Assistance Bed Mobility: Sidelying to Sit   Sidelying to sit: Supervision         Transfers Overall transfer level: Needs assistance Equipment used: Rolling walker (2 wheeled)   Sit to Stand: Min guard              Balance Overall balance assessment: History of Falls (2 falls in last month)                                          ADL Overall ADL's : Needs assistance/impaired     Grooming: Set up   Upper Body Bathing: Set up;Sitting   Lower Body Bathing: Minimal assistance;Sit to/from stand   Upper Body Dressing : Set up;Sitting   Lower Body Dressing: Minimal assistance;Sit to/from stand               Functional mobility during ADLs: Rolling walker;Min guard       Vision   Will further assess  Perception     Praxis      Pertinent Vitals/Pain Pain Assessment: No/denies pain     Hand Dominance Right   Extremity/Trunk Assessment Upper Extremity Assessment Upper Extremity Assessment: Generalized  weakness   Lower Extremity Assessment Lower Extremity Assessment: Generalized weakness   Cervical / Trunk Assessment Cervical / Trunk Assessment: Kyphotic   Communication Communication Communication: HOH   Cognition Arousal/Alertness: Awake/alert Behavior During Therapy: WFL for tasks assessed/performed Overall Cognitive Status: Within Functional Limits for tasks assessed                     General Comments       Exercises       Shoulder Instructions      Home Living Family/patient expects to be discharged to:: Private residence Living Arrangements: Children;Other relatives Available Help at Discharge: Family Type of Home: House Home Access: Ramped entrance     Home Layout: One level;Full bath on main level     Bathroom Shower/Tub: Producer, television/film/video: Standard Bathroom Accessibility: Yes   Home Equipment: Cane - single point;Walker - standard;Walker - 2 wheels;Walker - 4 wheels;Shower seat;Shower seat - built in;Grab bars - tub/shower;Grab bars - toilet   Additional Comments: Uses RW to transition from sit-to-stand, but uses cane to ambulate.      Prior Functioning/Environment Level of Independence: Independent with assistive device(s)        Comments: Pt states that he bathes  and dresses himself.    OT Diagnosis: Generalized weakness   OT Problem List: Decreased strength;Decreased activity tolerance;Impaired balance (sitting and/or standing);Decreased safety awareness;Decreased knowledge of use of DME or AE   OT Treatment/Interventions: Self-care/ADL training;Therapeutic exercise;Energy conservation;DME and/or AE instruction;Therapeutic activities;Patient/family education;Balance training    OT Goals(Current goals can be found in the care plan section) Acute Rehab OT Goals Patient Stated Goal: to get stronger and able to take cre of myself OT Goal Formulation: With patient/family Time For Goal Achievement: 03/06/16 Potential to  Achieve Goals: Good  OT Frequency: Min 2X/week   Barriers to D/C:            Co-evaluation              End of Session Nurse Communication: Mobility status  Activity Tolerance: Patient limited by fatigue Patient left: in bed;with call bell/phone within reach;with family/visitor present   Time: 2841-3244 OT Time Calculation (min): 16 min Charges:  OT General Charges $OT Visit: 1 Procedure OT Evaluation $OT Eval Low Complexity: 1 Procedure G-Codes:    [name],HILLARY Mar 10, 2016, 3:41 PM  [facility], OTR/L  [phone] 2016-03-10

## 2016-02-21 NOTE — Progress Notes (Signed)
PROGRESS NOTE    Micheal Frank  [id number] DOB: 08-Jul-1926 DOA: 02/19/2016 PCP: Kathlene November, MD  Brief Narrative; Micheal Frank is a 82 y.o. male with a past medical history significant for COPD, CKD, and hearing loss who presents with syncopal episode and urinary retention.  The patient lives with his daughter and was in his usual state of health until the past two days when he had itching that led him to take diphenhydramine several times, that led to urinary retention.  Yesterday morning after taking diphenhydramine he couldn't urinate, so his daughters took him to the [facility].  There a foley was placed with 500 cc urine with pyuria and nitrites, so he was started on cephalexin and discharged with a foley and tamsulosin.  When he got home he took a nap, and when he woke up from the nap he was sitting with daughters when he became weak and dizzy and then started to slump out of his chair.  He was unresponsive so daughters called 9-1-1 who arrived to find him diaphoretic, pale, lethargic and with SBP 88/palp, and so brought him to the ER.   Assessment & Plan:   Principal Problem:   Sepsis secondary to UTI [facility] Active Problems:   COPD GOLD II    Chronic kidney disease, stage III (moderate)   Hypotension   Sepsis ([place])   Urinary retention   UTI with sepsis source urine. Patient meets sepsis criteria given tachypnea, leukocytosis, withevidence of organ dysfunction (hypotension, syncope). Lactate normal. Antibiotics delivered in the ED.  - Continue with  ceftazidime given hx of Pseudomonas urinary culture -Urine culture from 8-05 grew Pseudomonas. Await urine culture from 8-06  Urinary retention: Was on a medicine for prostate years ago.  -Tamsulosin 0.4 mg  - fail voiding trial.  -has seen Dr. Karsten Ro in the past (urology) -family asking for urology consultation. I will discussed case with urology    COPD: -Continue inhalers   OSA: Not on CPAP  for several years.  RLS: -Continue ropinirole   Mood: -Continue bupropion  HTN: -Hold triamterene-HCTZ given BP -Hold doxazosin while on tamsulosin -Continue statin   DVT prophylaxis: SCD Code Status: Full code.  Family Communication: care discussed with daughter  Disposition Plan: home in 42 to 26 hours/    Consultants:   Urology, phone   Procedures:   none  Antimicrobials:  Ceftazidime    Subjective: He is feeling better after foley catheter placement. Had 900 urine retention.  Pain is better.    Objective: Vitals:   02/20/16 1243 02/20/16 1451 02/20/16 2144 02/21/16 0520  BP: (!) 120/55 (!) 139/56 (!) 141/62 (!) 141/79  Pulse: 60 76 66 78  Resp: 13 19 18 16   Temp: 97.8 F (36.6 C) 98.2 F (36.8 C) 98.3 F (36.8 C) 97.5 F (36.4 C)  TempSrc: Oral Oral Oral Oral  SpO2: 97% 95% 95% 91%  Weight:  69.9 kg (154 lb)    Height:  5\' 9"  (1.753 m)      Intake/Output Summary (Last 24 hours) at 02/21/16 0852 Last data filed at 02/20/16 2130  Gross per 24 hour  Intake              510 ml  Output              200 ml  Net              310 ml   Filed Weights   02/20/16 0247 02/20/16 1451  Weight: 73.2 kg (  161 lb 6 oz) 69.9 kg (154 lb)    Examination:  General exam: Appears calm and comfortable  Respiratory system: Clear to auscultation. Respiratory effort normal. Cardiovascular system: S1 & S2 heard, RRR. No JVD, murmurs, rubs, gallops or clicks. No pedal edema. Gastrointestinal system: Abdomen is nondistended, soft and nontender. No organomegaly or masses felt. Normal bowel sounds heard. Central nervous system: Alert and oriented. No focal neurological deficits. Extremities: Symmetric 5 x 5 power. Skin: No rashes, lesions or ulcers Psychiatry: Judgement and insight appear normal. Mood & affect appropriate.     Data Reviewed: I have personally reviewed following labs and imaging studies  CBC:  Recent Labs Lab 02/20/16 0000 02/20/16 0007  02/20/16 0302 02/21/16 0523  WBC 18.0*  --  16.6* 11.2*  NEUTROABS 14.8*  --   --   --   HGB 14.2 14.6 13.1 13.0  HCT 43.9 43.0 41.4 39.6  MCV 96.9  --  96.3 95.0  PLT 223  --  212 [id number]   Basic Metabolic Panel:  Recent Labs Lab 02/20/16 0000 02/20/16 0007 02/20/16 0302 02/21/16 0523  NA 140 143 140 139  K 3.7 3.8 3.5 3.3*  CL 108 102 109 111  CO2 25  --  23 22  GLUCOSE 113* 110* 100* 70  BUN 24* 30* 22* 16  CREATININE 1.85* 1.80* 1.60* 1.57*  CALCIUM 9.2  --  8.5* 8.5*   GFR: Estimated Creatinine Clearance: 32.2 mL/min (by C-G formula based on SCr of 1.57 mg/dL). Liver Function Tests:  Recent Labs Lab 02/20/16 0000  AST 21  ALT 6*  ALKPHOS 66  BILITOT 0.8  PROT 6.0*  ALBUMIN 3.4*   No results for input(s): LIPASE, AMYLASE in the last 168 hours. No results for input(s): AMMONIA in the last 168 hours. Coagulation Profile: No results for input(s): INR, PROTIME in the last 168 hours. Cardiac Enzymes: No results for input(s): CKTOTAL, CKMB, CKMBINDEX, TROPONINI in the last 168 hours. BNP (last 3 results) No results for input(s): PROBNP in the last 8760 hours. HbA1C: No results for input(s): HGBA1C in the last 72 hours. CBG: No results for input(s): GLUCAP in the last 168 hours. Lipid Profile: No results for input(s): CHOL, HDL, LDLCALC, TRIG, CHOLHDL, LDLDIRECT in the last 72 hours. Thyroid Function Tests: No results for input(s): TSH, T4TOTAL, FREET4, T3FREE, THYROIDAB in the last 72 hours. Anemia Panel: No results for input(s): VITAMINB12, FOLATE, FERRITIN, TIBC, IRON, RETICCTPCT in the last 72 hours. Sepsis Labs:  Recent Labs Lab 02/20/16 0008  LATICACIDVEN 1.13    Recent Results (from the past 240 hour(s))  Urine culture     Status: Abnormal (Preliminary result)   Collection Time: 02/19/16 10:10 AM  Result Value Ref Range Status   Specimen Description URINE, CATHETERIZED  Final   Special Requests NONE  Final   Culture >=100,000 COLONIES/mL  PSEUDOMONAS AERUGINOSA (A)  Final   Report Status PENDING  Incomplete  Blood culture (routine x 2)     Status: None (Preliminary result)   Collection Time: 02/20/16 12:00 AM  Result Value Ref Range Status   Specimen Description BLOOD RIGHT ANTECUBITAL  Final   Special Requests BOTTLES DRAWN AEROBIC AND ANAEROBIC 5CC   Final   Culture NO GROWTH < 24 HOURS  Final   Report Status PENDING  Incomplete  Blood culture (routine x 2)     Status: None (Preliminary result)   Collection Time: 02/20/16 12:00 AM  Result Value Ref Range Status   Specimen Description BLOOD LEFT  ANTECUBITAL  Final   Special Requests BOTTLES DRAWN AEROBIC AND ANAEROBIC 5CC   Final   Culture NO GROWTH < 24 HOURS  Final   Report Status PENDING  Incomplete  Culture, blood (Routine x 2)     Status: None (Preliminary result)   Collection Time: 02/20/16  2:53 AM  Result Value Ref Range Status   Specimen Description BLOOD RIGHT HAND  Final   Special Requests BOTTLES DRAWN AEROBIC ONLY 4CC  Final   Culture NO GROWTH < 24 HOURS  Final   Report Status PENDING  Incomplete  Culture, blood (Routine x 2)     Status: None (Preliminary result)   Collection Time: 02/20/16  2:56 AM  Result Value Ref Range Status   Specimen Description BLOOD LEFT HAND  Final   Special Requests BOTTLES DRAWN AEROBIC ONLY 4CC  Final   Culture NO GROWTH < 24 HOURS  Final   Report Status PENDING  Incomplete  MRSA PCR Screening     Status: None   Collection Time: 02/20/16  4:06 AM  Result Value Ref Range Status   MRSA by PCR NEGATIVE NEGATIVE Final    Comment:        The GeneXpert MRSA Assay (FDA approved for NASAL specimens only), is one component of a comprehensive MRSA colonization surveillance program. It is not intended to diagnose MRSA infection nor to guide or monitor treatment for MRSA infections.          Radiology Studies: Dg Chest 2 View  Result Date: 02/20/2016 CLINICAL DATA:  82 year old male with hypoxia EXAM: CHEST  2 VIEW  COMPARISON:  Chest radiograph dated 08/04/2015 FINDINGS: Two views of the chest demonstrate emphysematous changes of the lungs with interstitial coarsening. Left lung base density, similar to prior study, likely atelectatic changes/scarring. No focal consolidation, pleural effusion, or pneumothorax. There is osteopenia with degenerative changes of the spine. No acute fracture. IMPRESSION: No acute cardiopulmonary process. Electronically Signed   By: Anner Crete M.D.   On: 02/20/2016 00:56        Scheduled Meds: . antiseptic oral rinse  7 mL Mouth Rinse q12n4p  . buPROPion  200 mg Oral BID WC  . cefTAZidime (FORTAZ)  IV  1 g Intravenous Q24H  . chlorhexidine  15 mL Mouth Rinse BID  . mometasone-formoterol  2 puff Inhalation BID  . montelukast  10 mg Oral Daily  . pantoprazole  40 mg Oral Daily  . rOPINIRole  1 mg Oral QHS  . simvastatin  40 mg Oral QHS  . tamsulosin  0.4 mg Oral Daily   Continuous Infusions:    LOS: 1 day    Time spent: 35 minutes.     Elmarie Shiley, MD [facility] Pager [phone]  If 7PM-7AM, please contact night-coverage [email or web address] Password [email or web address] 02/21/2016, 8:52 AM

## 2016-02-21 NOTE — Progress Notes (Addendum)
Pt c/o lower abdominal pain, bladder scanned and noted 930 ml. Notified Dr. Tyrell Antonio, awaiting orders.  Order placed to place Foley catheter for urinary retention.

## 2016-02-22 ENCOUNTER — Telehealth: Payer: Self-pay

## 2016-02-22 LAB — URINE CULTURE
Culture: >=100000 — AB
Culture: >=100000 — AB

## 2016-02-22 MED ORDER — ACETAMINOPHEN 325 MG PO TABS: 650.0000 mg | ORAL_TABLET | Freq: Four times a day (QID) | ORAL | 0 refills | Status: DC | PRN

## 2016-02-22 MED ORDER — SACCHAROMYCES BOULARDII 250 MG PO CAPS
250.0000 mg | ORAL_CAPSULE | Freq: Two times a day (BID) | ORAL | Status: DC
  Administered 2016-02-22: 250 mg via ORAL
  Filled 2016-02-22: qty 1

## 2016-02-22 MED ORDER — SACCHAROMYCES BOULARDII 250 MG PO CAPS: 250.0000 mg | ORAL_CAPSULE | Freq: Two times a day (BID) | ORAL | 0 refills | Status: DC

## 2016-02-22 MED ORDER — URELLE 81 MG PO TABS: 1.0000 | ORAL_TABLET | Freq: Four times a day (QID) | ORAL | 0 refills | Status: DC | PRN

## 2016-02-22 MED ORDER — CIPROFLOXACIN HCL 250 MG PO TABS: 500.0000 mg | ORAL_TABLET | Freq: Every day | ORAL | 0 refills | Status: DC

## 2016-02-22 NOTE — Progress Notes (Signed)
Physical Therapy Treatment Patient Details Name: MARIUSZ SHIRAZI MRN: [id number] DOB: 1925/10/08 Today's Date: 02/22/2016    History of Present Illness 82 y.o. male with a pmh significant for COPD, CKD, and hearing loss who presents with sepsis secondary to UTI.    PT Comments    Progressing well.  Still needs work on Midwife.   Follow Up Recommendations  Outpatient PT     Equipment Recommendations       Recommendations for Other Services       Precautions / Restrictions Precautions Precautions: Fall    Mobility  Bed Mobility Overal bed mobility: Needs Assistance Bed Mobility: Supine to Sit;Sit to Supine     Supine to sit: Supervision Sit to supine: Supervision   General bed mobility comments: up without use of rails  Transfers Overall transfer level: Needs assistance   Transfers: Sit to/from Stand Sit to Stand: Min guard         General transfer comment: cues for hand placement  Ambulation/Gait Ambulation/Gait assistance: Min guard Ambulation Distance (Feet): 100 Feet Assistive device: Rolling walker (2 wheeled) Gait Pattern/deviations: Step-through pattern   Gait velocity interpretation: at or above normal speed for age/gender General Gait Details: steady with RW, safe use of the RW   Stairs            Wheelchair Mobility    Modified Rankin (Stroke Patients Only)       Balance Overall balance assessment: History of Falls                                  Cognition   Behavior During Therapy: WFL for tasks assessed/performed Overall Cognitive Status: Within Functional Limits for tasks assessed                      Exercises General Exercises - Lower Extremity Ankle Circles/Pumps: AROM;Strengthening;Supine Heel Slides: AROM;Strengthening;Both;10 reps;Supine (resisted flexion/extension) Straight Leg Raises: AROM;Strengthening;Both;10 reps;Supine Other Exercises Other Exercises: bridging  x10    General Comments        Pertinent Vitals/Pain Pain Assessment: No/denies pain    Home Living                      Prior Function            PT Goals (current goals can now be found in the care plan section) Acute Rehab PT Goals Patient Stated Goal: to get stronger and able to take cre of myself PT Goal Formulation: With patient/family Time For Goal Achievement: 03/05/16 Potential to Achieve Goals: Good Progress towards PT goals: Progressing toward goals    Frequency  Min 3X/week    PT Plan Current plan remains appropriate    Co-evaluation             End of Session   Activity Tolerance: Patient tolerated treatment well Patient left: in bed;with call bell/phone within reach;with bed alarm set;with family/visitor present     Time: [id number] PT Time Calculation (min) (ACUTE ONLY): 17 min  Charges:  $Gait Training: 8-22 mins                    G Codes:      Tylesha Gibeault, Tessie Fass 02/22/2016, 2:30 PM 02/22/2016  Donnella Sham, PT [phone]  (pager)

## 2016-02-22 NOTE — Care Management Note (Signed)
Case Management Note  Patient Details  Name: BAYARD DEFUSCO MRN: [id number] Date of Birth: Feb 17, 1926  Subjective/Objective:                 Spoke with patient and daughter in room. Daughter states that patient has Rx for 6 visits from PCP for OP PT. It is good through the end of Aug to initiate therapy. She has been in contact with [employer] [employer] to schedule therapy. Patient and family decline [facility] services. Daughter states she has taken care of indwelling foley at hom ebefore and anticipates it being removed at urologist appointment next week. Patient has cane and walker at home, no further DME needed.  Action/Plan:  DC to home in care of daughter today, no further CM needs identified.   Expected Discharge Date:                  Expected Discharge Plan:  Home/Self Care  In-House Referral:     Discharge planning Services  CM Consult  Post Acute Care Choice:  NA Choice offered to:  NA  DME Arranged:  N/A DME Agency:  NA  HH Arranged:  NA HH Agency:  NA  Status of Service:  Completed, signed off  If discussed at [place] of Stay Meetings, dates discussed:    Additional Comments:  Carles Collet, RN 02/22/2016, 11:29 AM

## 2016-02-22 NOTE — Progress Notes (Addendum)
Patient was discharged home by MD order; discharged instructions  review and give to patient and his wife with care notes and prescriptions; IV DIC; patient will be escorted to the car by a volunteer via wheelchair.  

## 2016-02-22 NOTE — Telephone Encounter (Signed)
02/22/16 patient in hospital

## 2016-02-22 NOTE — Discharge Summary (Signed)
Physician Discharge Summary  RHOAN MCQUARTER [id number] DOB: [birth year] DOA: 02/19/2016  PCP: Kathlene November, MD  Admit date: 02/19/2016 Discharge date: 02/22/2016  Admitted From: Home  Disposition: Home   Recommendations for Outpatient Follow-up:  1. Follow up with PCP in 1-2 weeks 2. Please obtain BMP/CBC in one week 3. Needs follow up with urology      Discharge Condition: stable.  CODE STATUS: full code.  Diet recommendation: Heart Healthy   Brief/Interim Summary: Brief Narrative; Avrahom Manwell Smithis a 82 y.o.malewith a past medical history significant for COPD, CKD, and hearing losswho presents with syncopal episode and urinary retention.  The patient lives with his daughter and was in his usual state of health until the past two days when he had itching that led him to take diphenhydramine several times, that led to urinary retention.  Yesterday morning after taking diphenhydramine he couldn't urinate, so his daughters took him to the [facility]. There a foley was placed with 500 cc urine with pyuria and nitrites, so he was started on cephalexin and discharged with a foley and tamsulosin. When he got home he took a nap, and when he woke up from the nap he was sitting with daughters when he became weak and dizzy and then started to slump out of his chair. He was unresponsive so daughters called 9-1-1 who arrived to find him diaphoretic, pale, lethargic and with SBP 88/palp, and so brought him to the ER.   Assessment & Plan:   Principal Problem:   Sepsis secondary to UTI [facility]) Active Problems:   COPD GOLD II    Chronic kidney disease, stage III (moderate)   Hypotension   Sepsis ([place])   Urinary retention   UTI with sepsis source urine. Patient meets sepsis criteria given tachypnea, leukocytosis, withevidence of organ dysfunction (hypotension, syncope). Lactate normal. Antibiotics delivered in the ED.  - Continue with  ceftazidime given hx of Pseudomonas urinary  culture -Urine culture from 8-05 grew Pseudomonas. Discharge on ciprofloxacin, renal dose.  -discharge with 7 days of antibiotics.   Urinary retention: Was on a medicine for prostate years ago.  -has seen Dr. Karsten Ro in the past (urology) -discussed with urology on call, treat UTI, discharge patient with foley and out patient follow up with urology  -on Doxazosin.   COPD: -Continue inhalers  OSA: Not on CPAP for several years.  RLS: -Continue ropinirole  Mood: -Continue bupropion  HTN: -Hold triamterene-HCTZ given BP -Resume  doxazosin  -Continue statin  Discharge Diagnoses:  Principal Problem:   Sepsis secondary to UTI [facility]) Active Problems:   COPD GOLD II    Chronic kidney disease, stage III (moderate)   Hypotension   Sepsis ([place])   Urinary retention    Discharge Instructions  Discharge Instructions    Diet - low sodium heart healthy    Complete by:  As directed   Increase activity slowly    Complete by:  As directed       Medication List    STOP taking these medications   cephALEXin 500 MG capsule Commonly known as:  KEFLEX   OMEPRAZOLE PO   tamsulosin 0.4 MG Caps capsule Commonly known as:  FLOMAX   triamterene-hydrochlorothiazide 37.5-25 MG tablet Commonly known as:  MAXZIDE-25     TAKE these medications   acetaminophen 325 MG tablet Commonly known as:  TYLENOL Take 2 tablets (650 mg total) by mouth every 6 (six) hours as needed for mild pain (or Fever >/= 101).   albuterol 108 (90  Base) MCG/ACT inhaler Commonly known as:  PROAIR HFA Inhale 2 puffs into the lungs every 6 (six) hours as needed for wheezing.   AZO CRANBERRY URINARY TRACT 250-60 MG Caps Generic drug:  Cranberry-Vitamin C Take 1 tablet by mouth daily.   benzonatate 200 MG capsule Commonly known as:  TESSALON TAKE ONE CAPSULE 3-4 TIMES DAILY AS NEEDED FOR COUGH What changed:  how much to take  how to take this  when to take  this  reasons to take this  additional instructions   budesonide-formoterol 160-4.5 MCG/ACT inhaler Commonly known as:  SYMBICORT Inhale 2 puffs into the lungs 2 (two) times daily.   buPROPion 200 MG 12 hr tablet Commonly known as:  WELLBUTRIN SR Take 1 tablet (200 mg total) by mouth 2 (two) times daily.   calcium carbonate 600 MG Tabs tablet Commonly known as:  OS-CAL Take 600 mg by mouth daily.   ciprofloxacin 250 MG tablet Commonly known as:  CIPRO Take 2 tablets (500 mg total) by mouth daily.   diazepam 5 MG tablet Commonly known as:  VALIUM Take 5 mg by mouth every 6 (six) hours as needed for anxiety. Reported on 08/04/2015   doxazosin 4 MG tablet Commonly known as:  CARDURA Take 1 tablet (4 mg total) by mouth at bedtime.   fluticasone 50 MCG/ACT nasal spray Commonly known as:  FLONASE Place 2 sprays into the nose 2 (two) times daily.   meclizine 25 MG tablet Commonly known as:  ANTIVERT Take 25 mg by mouth daily as needed (vertigo). Reported on 08/04/2015   montelukast 10 MG tablet Commonly known as:  SINGULAIR Take 1 tablet (10 mg total) by mouth daily. Reported on 08/04/2015   multivitamin with minerals Tabs tablet Take 1 tablet by mouth daily.   rOPINIRole 1 MG tablet Commonly known as:  REQUIP Take 1 tablet (1 mg total) by mouth at bedtime.   saccharomyces boulardii 250 MG capsule Commonly known as:  FLORASTOR Take 1 capsule (250 mg total) by mouth 2 (two) times daily.   simvastatin 40 MG tablet Commonly known as:  ZOCOR Take 1 tablet (40 mg total) by mouth at bedtime.   URELLE 81 MG Tabs tablet Take 1 tablet (81 mg total) by mouth every 6 (six) hours as needed for bladder spasms.       Allergies  Allergen Reactions  . Anticoagulant Compound     History of bleeding on the brain per daughter  . Protonix [Pantoprazole Sodium] Diarrhea    Consultations:  Phone, urology    Procedures/Studies: Dg Chest 2 View  Result Date:  02/20/2016 CLINICAL DATA:  82 year old male with hypoxia EXAM: CHEST  2 VIEW COMPARISON:  Chest radiograph dated 08/04/2015 FINDINGS: Two views of the chest demonstrate emphysematous changes of the lungs with interstitial coarsening. Left lung base density, similar to prior study, likely atelectatic changes/scarring. No focal consolidation, pleural effusion, or pneumothorax. There is osteopenia with degenerative changes of the spine. No acute fracture. IMPRESSION: No acute cardiopulmonary process. Electronically Signed   By: Anner Crete M.D.   On: 02/20/2016 00:56      Subjective: He is feeling better   Discharge Exam: Vitals:   02/21/16 2133 02/22/16 0515  BP: 126/72 120/72  Pulse: 91 76  Resp: 18 18  Temp: 98.9 F (37.2 C) 98.2 F (36.8 C)   Vitals:   02/21/16 2018 02/21/16 2133 02/22/16 0515 02/22/16 1131  BP:  126/72 120/72   Pulse:  91 76   Resp:  18 18   Temp:  98.9 F (37.2 C) 98.2 F (36.8 C)   TempSrc:  Oral Oral   SpO2: 95% 96% 94% 97%  Weight:      Height:        General: Pt is alert, awake, not in acute distress Cardiovascular: RRR, S1/S2 +, no rubs, no gallops Respiratory: CTA bilaterally, no wheezing, no rhonchi Abdominal: Soft, NT, ND, bowel sounds + Extremities: no edema, no cyanosis    The results of significant diagnostics from this hospitalization (including imaging, microbiology, ancillary and laboratory) are listed below for reference.     Microbiology: Recent Results (from the past 240 hour(s))  Urine culture     Status: Abnormal   Collection Time: 02/19/16 10:10 AM  Result Value Ref Range Status   Specimen Description URINE, CATHETERIZED  Final   Special Requests NONE  Final   Culture (A)  Final    >=100,000 COLONIES/mL PSEUDOMONAS AERUGINOSA >=100,000 COLONIES/mL ENTEROCOCCUS SPECIES    Report Status 02/22/2016 FINAL  Final   Organism ID, Bacteria ENTEROCOCCUS SPECIES (A)  Final      Susceptibility   Enterococcus species - MIC*     AMPICILLIN <=2 SENSITIVE Sensitive     LEVOFLOXACIN 1 SENSITIVE Sensitive     NITROFURANTOIN <=16 SENSITIVE Sensitive     VANCOMYCIN 1 SENSITIVE Sensitive     * >=100,000 COLONIES/mL ENTEROCOCCUS SPECIES  Blood culture (routine x 2)     Status: None (Preliminary result)   Collection Time: 02/20/16 12:00 AM  Result Value Ref Range Status   Specimen Description BLOOD RIGHT ANTECUBITAL  Final   Special Requests BOTTLES DRAWN AEROBIC AND ANAEROBIC 5CC   Final   Culture NO GROWTH 2 DAYS  Final   Report Status PENDING  Incomplete  Blood culture (routine x 2)     Status: None (Preliminary result)   Collection Time: 02/20/16 12:00 AM  Result Value Ref Range Status   Specimen Description BLOOD LEFT ANTECUBITAL  Final   Special Requests BOTTLES DRAWN AEROBIC AND ANAEROBIC 5CC   Final   Culture NO GROWTH 2 DAYS  Final   Report Status PENDING  Incomplete  Urine culture     Status: Abnormal   Collection Time: 02/20/16 12:47 AM  Result Value Ref Range Status   Specimen Description URINE, RANDOM  Final   Special Requests NONE  Final   Culture >=100,000 COLONIES/mL PSEUDOMONAS AERUGINOSA (A)  Final   Report Status 02/22/2016 FINAL  Final   Organism ID, Bacteria PSEUDOMONAS AERUGINOSA (A)  Final      Susceptibility   Pseudomonas aeruginosa - MIC*    CEFTAZIDIME 2 SENSITIVE Sensitive     CIPROFLOXACIN 0.5 SENSITIVE Sensitive     GENTAMICIN 8 INTERMEDIATE Intermediate     IMIPENEM 1 SENSITIVE Sensitive     PIP/TAZO 8 SENSITIVE Sensitive     CEFEPIME 4 SENSITIVE Sensitive     * >=100,000 COLONIES/mL PSEUDOMONAS AERUGINOSA  Culture, blood (Routine x 2)     Status: None (Preliminary result)   Collection Time: 02/20/16  2:53 AM  Result Value Ref Range Status   Specimen Description BLOOD RIGHT HAND  Final   Special Requests BOTTLES DRAWN AEROBIC ONLY 4CC  Final   Culture NO GROWTH 2 DAYS  Final   Report Status PENDING  Incomplete  Culture, blood (Routine x 2)     Status: None (Preliminary result)    Collection Time: 02/20/16  2:56 AM  Result Value Ref Range Status   Specimen Description BLOOD LEFT  HAND  Final   Special Requests BOTTLES DRAWN AEROBIC ONLY 4CC  Final   Culture NO GROWTH 2 DAYS  Final   Report Status PENDING  Incomplete  MRSA PCR Screening     Status: None   Collection Time: 02/20/16  4:06 AM  Result Value Ref Range Status   MRSA by PCR NEGATIVE NEGATIVE Final    Comment:        The GeneXpert MRSA Assay (FDA approved for NASAL specimens only), is one component of a comprehensive MRSA colonization surveillance program. It is not intended to diagnose MRSA infection nor to guide or monitor treatment for MRSA infections.      Labs: BNP (last 3 results) No results for input(s): BNP in the last 8760 hours. Basic Metabolic Panel:  Recent Labs Lab 02/20/16 0000 02/20/16 0007 02/20/16 0302 02/21/16 0523  NA 140 143 140 139  K 3.7 3.8 3.5 3.3*  CL 108 102 109 111  CO2 25  --  23 22  GLUCOSE 113* 110* 100* 70  BUN 24* 30* 22* 16  CREATININE 1.85* 1.80* 1.60* 1.57*  CALCIUM 9.2  --  8.5* 8.5*   Liver Function Tests:  Recent Labs Lab 02/20/16 0000  AST 21  ALT 6*  ALKPHOS 66  BILITOT 0.8  PROT 6.0*  ALBUMIN 3.4*   No results for input(s): LIPASE, AMYLASE in the last 168 hours. No results for input(s): AMMONIA in the last 168 hours. CBC:  Recent Labs Lab 02/20/16 0000 02/20/16 0007 02/20/16 0302 02/21/16 0523  WBC 18.0*  --  16.6* 11.2*  NEUTROABS 14.8*  --   --   --   HGB 14.2 14.6 13.1 13.0  HCT 43.9 43.0 41.4 39.6  MCV 96.9  --  96.3 95.0  PLT 223  --  212 199   Cardiac Enzymes: No results for input(s): CKTOTAL, CKMB, CKMBINDEX, TROPONINI in the last 168 hours. BNP: Invalid input(s): POCBNP CBG: No results for input(s): GLUCAP in the last 168 hours. D-Dimer No results for input(s): DDIMER in the last 72 hours. Hgb A1c No results for input(s): HGBA1C in the last 72 hours. Lipid Profile No results for input(s): CHOL, HDL,  LDLCALC, TRIG, CHOLHDL, LDLDIRECT in the last 72 hours. Thyroid function studies No results for input(s): TSH, T4TOTAL, T3FREE, THYROIDAB in the last 72 hours.  Invalid input(s): FREET3 Anemia work up No results for input(s): VITAMINB12, FOLATE, FERRITIN, TIBC, IRON, RETICCTPCT in the last 72 hours. Urinalysis    Component Value Date/Time   COLORURINE YELLOW 02/20/2016 0048   APPEARANCEUR CLOUDY (A) 02/20/2016 0048   LABSPEC 1.019 02/20/2016 0048   PHURINE 7.0 02/20/2016 0048   GLUCOSEU NEGATIVE 02/20/2016 0048   HGBUR LARGE (A) 02/20/2016 0048   BILIRUBINUR NEGATIVE 02/20/2016 0048   KETONESUR NEGATIVE 02/20/2016 0048   PROTEINUR 30 (A) 02/20/2016 0048   UROBILINOGEN 1.0 01/23/2013 0915   NITRITE NEGATIVE 02/20/2016 0048   LEUKOCYTESUR LARGE (A) 02/20/2016 0048   Sepsis Labs Invalid input(s): PROCALCITONIN,  WBC,  LACTICIDVEN Microbiology Recent Results (from the past 240 hour(s))  Urine culture     Status: Abnormal   Collection Time: 02/19/16 10:10 AM  Result Value Ref Range Status   Specimen Description URINE, CATHETERIZED  Final   Special Requests NONE  Final   Culture (A)  Final    >=100,000 COLONIES/mL PSEUDOMONAS AERUGINOSA >=100,000 COLONIES/mL ENTEROCOCCUS SPECIES    Report Status 02/22/2016 FINAL  Final   Organism ID, Bacteria ENTEROCOCCUS SPECIES (A)  Final  Susceptibility   Enterococcus species - MIC*    AMPICILLIN <=2 SENSITIVE Sensitive     LEVOFLOXACIN 1 SENSITIVE Sensitive     NITROFURANTOIN <=16 SENSITIVE Sensitive     VANCOMYCIN 1 SENSITIVE Sensitive     * >=100,000 COLONIES/mL ENTEROCOCCUS SPECIES  Blood culture (routine x 2)     Status: None (Preliminary result)   Collection Time: 02/20/16 12:00 AM  Result Value Ref Range Status   Specimen Description BLOOD RIGHT ANTECUBITAL  Final   Special Requests BOTTLES DRAWN AEROBIC AND ANAEROBIC 5CC   Final   Culture NO GROWTH 2 DAYS  Final   Report Status PENDING  Incomplete  Blood culture (routine x  2)     Status: None (Preliminary result)   Collection Time: 02/20/16 12:00 AM  Result Value Ref Range Status   Specimen Description BLOOD LEFT ANTECUBITAL  Final   Special Requests BOTTLES DRAWN AEROBIC AND ANAEROBIC 5CC   Final   Culture NO GROWTH 2 DAYS  Final   Report Status PENDING  Incomplete  Urine culture     Status: Abnormal   Collection Time: 02/20/16 12:47 AM  Result Value Ref Range Status   Specimen Description URINE, RANDOM  Final   Special Requests NONE  Final   Culture >=100,000 COLONIES/mL PSEUDOMONAS AERUGINOSA (A)  Final   Report Status 02/22/2016 FINAL  Final   Organism ID, Bacteria PSEUDOMONAS AERUGINOSA (A)  Final      Susceptibility   Pseudomonas aeruginosa - MIC*    CEFTAZIDIME 2 SENSITIVE Sensitive     CIPROFLOXACIN 0.5 SENSITIVE Sensitive     GENTAMICIN 8 INTERMEDIATE Intermediate     IMIPENEM 1 SENSITIVE Sensitive     PIP/TAZO 8 SENSITIVE Sensitive     CEFEPIME 4 SENSITIVE Sensitive     * >=100,000 COLONIES/mL PSEUDOMONAS AERUGINOSA  Culture, blood (Routine x 2)     Status: None (Preliminary result)   Collection Time: 02/20/16  2:53 AM  Result Value Ref Range Status   Specimen Description BLOOD RIGHT HAND  Final   Special Requests BOTTLES DRAWN AEROBIC ONLY 4CC  Final   Culture NO GROWTH 2 DAYS  Final   Report Status PENDING  Incomplete  Culture, blood (Routine x 2)     Status: None (Preliminary result)   Collection Time: 02/20/16  2:56 AM  Result Value Ref Range Status   Specimen Description BLOOD LEFT HAND  Final   Special Requests BOTTLES DRAWN AEROBIC ONLY 4CC  Final   Culture NO GROWTH 2 DAYS  Final   Report Status PENDING  Incomplete  MRSA PCR Screening     Status: None   Collection Time: 02/20/16  4:06 AM  Result Value Ref Range Status   MRSA by PCR NEGATIVE NEGATIVE Final    Comment:        The GeneXpert MRSA Assay (FDA approved for NASAL specimens only), is one component of a comprehensive MRSA colonization surveillance program. It is  not intended to diagnose MRSA infection nor to guide or monitor treatment for MRSA infections.      Time coordinating discharge: Over 30 minutes  SIGNED:   Elmarie Shiley, MD  [facility] 02/22/2016, 4:36 PM Pager [phone]  If 7PM-7AM, please contact night-coverage [email or web address] Password [email or web address]

## 2016-02-23 ENCOUNTER — Encounter: Payer: Self-pay | Admitting: Family Medicine

## 2016-02-23 ENCOUNTER — Telehealth: Payer: Self-pay | Admitting: *Deleted

## 2016-02-23 ENCOUNTER — Ambulatory Visit: Payer: Medicare Other | Admitting: Physical Therapy

## 2016-02-23 ENCOUNTER — Ambulatory Visit (INDEPENDENT_AMBULATORY_CARE_PROVIDER_SITE_OTHER): Payer: Medicare Other | Admitting: Family Medicine

## 2016-02-23 VITALS — BP 110/60 | HR 78 | Temp 97.8°F | Ht 69.5 in | Wt 158.2 lb

## 2016-02-23 DIAGNOSIS — T83038A Leakage of other indwelling urethral catheter, initial encounter: Secondary | ICD-10-CM

## 2016-02-23 DIAGNOSIS — Z09 Encounter for follow-up examination after completed treatment for conditions other than malignant neoplasm: Secondary | ICD-10-CM | POA: Diagnosis not present

## 2016-02-23 NOTE — Telephone Encounter (Signed)
Transition Care Management Follow-up Telephone Call  Per Discharge Summary:  PCP: Kathlene November, MD  Admit date: 02/19/2016 Discharge date: 02/22/2016  Admitted From: Home  Disposition: Home   Recommendations for Outpatient Follow-up:  1. Follow up with PCP in 1-2 weeks 2. Please obtain BMP/CBC in one week 3. Needs follow up with urology   Discharge Condition: stable.  CODE STATUS: full code.  Diet recommendation: Heart Healthy   --  Call completed w/ pt's daughter.    How have you been since you were released from the hospital? "He's been doing fine, but as soon as we got home the catheter started leaking." Pt has appointment today w/ Dr. Lorelei Pont to assess catheter.   Do you understand why you were in the hospital? yes   Do you understand the discharge instructions? yes   Where were you discharged to? Home  Items Reviewed:  Medications reviewed: yes  Allergies reviewed: yes  Dietary changes reviewed: no, none made  Referrals reviewed: yes, urology appt next Tuesday.   Functional Questionnaire:   Activities of Daily Living (ADLs):   He states they are independent in the following: ambulation, bathing and hygiene, feeding, continence, grooming, toileting and dressing States they require assistance with the following: none   Any transportation issues/concerns?: no   Any patient concerns? Yes  Pt's catheter site is leaking urine. No pain and no other symptoms. Pt's daughter would like to set up home health services for assessment and evaluation while he has the Foley.   The patient was previously wearing depends all day and daughter wanted to know if this could contribute to his frequent UTIs; advised her that urinary incontinence and wearing adult diapers does increase his risk for UTI and skin breakdown.   The patient has been on PRN valium for years for Meniere's disease flares. His current rx is expired. They would like refill if appropriate.  In the  hospital, the provider told the daughter that the pt had dementia, but she states that's the first she's heard of that and wanted clarification. I advised her this is not on pt's problem list, and they can discuss w/ PCP at appointment.   Pt is experiencing some bowel incontinence since being discharged, which is new for him.    Confirmed importance and date/time of follow-up visits scheduled yes  Provider Appointment booked with Dr. Kathlene November 03/01/16 @ 11:30am, Dr. Lorelei Pont 02/23/16 for acute visit for leaking catheter  Confirmed with patient if condition begins to worsen call PCP or go to the ER.  Patient was given the office number and encouraged to call back with question or concerns.  : yes

## 2016-02-23 NOTE — Patient Instructions (Signed)
Your catheter seems to fit properly and the bulb is inflated ok. You may leakage just from bladder spasm- this is actually a good sign that your bladder muscle is starting to work again.    Please see the urologist on Friday at [place]- I hope that they will be able to remove your catheter at that time.    We will put in for home health for you Let me know if there is anything further we can do for you!

## 2016-02-23 NOTE — Telephone Encounter (Signed)
[facility] and spoke w/ triage nurse, Mickel Baas. She stated that leaking catheter is a normal finding and is due to bladder spasms and that patient's catheter needed to stay in at least until his appointment on Tuesday. Attempted to explain that the family and PCP just wanted pt assessed, and were not expecting the catheter to be removed early. She offered 8:30 appt on Friday morning for voiding trial. Discussed w/ Dr. Larose Kells and Dr. Lorelei Pont. Given that pt's appointment in our office is in 30 minutes, Dr. Lorelei Pont agreed to see pt today and will determine if pt should keep Friday appt w/ urology or cancel and go to Tuesday appt as originally scheduled.

## 2016-02-23 NOTE — Progress Notes (Signed)
Pre visit review using our clinic review tool, if applicable. No additional management support is needed unless otherwise documented below in the visit note. 

## 2016-02-23 NOTE — Progress Notes (Signed)
[name] [address], [place], [place] [address] [phone]  Date:  02/23/2016   Name:  Micheal Frank   DOB:  1925/11/17   MRN:  [id number]  PCP:  Kathlene November, MD    Chief Complaint: Catheter leaking (Pt was seen in the ER on 02/19/16 and foley catheter and was inserted and removed the same day. Then Sunday 02/20/16 condom catheter was placed until 02/21/16.  On 02/21/16 another foley catheter was placed. Pt states that catheter leaks. )   History of Present Illness:  Micheal Frank is a 82 y.o. very pleasant male patient who presents with the following:  He was admitted from 8/5 to 8/8 with urinary retention and urosepsis.  He was seen in the ER on 8/4 and they placed a foley for urinary retention and started abx as his urine was dirty.  They next day he work up feeling poorly, then became unresponsive and hypotensive. EMS called, transported to ER and admitted for urosepsis.  Urine culture grew pseudomonas , he was discharged home with cipro which he is tolerating well  He still has a foley catheter in place, they called for an appt as the have noted that he seems to be leaking urine into his depends. He uses depends at baseline, but does not normally use a catheter.  His daughter has noted that his depends will be consistently wet and does not want him to get a rash or skin irritation.  They have not noted any fever. He does not have any bladder or abd pain.  No lightheadedness.  Overall he is feeling better and getting his strength back He has a 16 french foley in place.  Daughter says this was changed out shortly before they left the hospital and they did not know it was leaking until they got home  Patient Active Problem List   Diagnosis Date Noted  . Hypotension 02/20/2016  . Sepsis secondary to UTI ([place]) 02/20/2016  . Sepsis ([place]) 02/20/2016  . Urinary retention 02/20/2016  . Chronic cough 08/16/2015  . PCP NOTES  >>>>>> 04/06/2015  . CTS (carpal tunnel syndrome) 11/16/2014  . Hyperlipidemia 08/17/2014  . Sleep apnea 08/17/2014  . Superficial bruising of chest wall 07/29/2014  . Urgency incontinence 01/23/2013  . Chronic kidney disease, stage III (moderate) 01/20/2013  . At high risk for falls 11/28/2012  . ALLERGIC RHINITIS 12/09/2009  . CARCINOMA IN SITU, SKIN 04/30/2008  . RESTLESS LEG SYNDROME 04/30/2008  . NEUROPATHY 04/30/2008  . GLAUCOMA, BORDERLINE 04/30/2008  . Meniere's disease 04/30/2008  . HEARING LOSS 04/30/2008  . COPD GOLD II  04/30/2008  . DJD (degenerative joint disease) 04/30/2008  . Osteoporosis 04/30/2008  . SLEEP APNEA 04/30/2008  . HISTORY OF ASBESTOS EXPOSURE 04/30/2008    Past Medical History:  Diagnosis Date  . Arthritis    left leg also has swelling  . Asthma   . Blood dyscrasia    pt CAN NOT have any blood thinners d/t hx brain bleed  . Chronic kidney disease, stage III (moderate) 01/20/2013  . Complication of anesthesia    confusion with anesthesia  . Constipation    miralax prn  . COPD (chronic obstructive pulmonary disease) (HCC)    emphysema  . Depression    takes wellbutrin bid  . Epidural abscess   . Gastric ulcer   . History of blood clots   . Hyperlipidemia   . Melanoma ([place])   .  Peripheral neuropathy ([place])   . Pneumonia    as a child and in 1952;had a pneumonia vaccine couple of years ago  . Prosthetic joint infection ([place])   . S/P right heart catheterization    at least 56yrs  . Shingles   . Short-term memory loss   . Shortness of breath    with exertion  . Sleep apnea    has CPAP but doesn't use it;sleep study done at least 59yrs ago  . Staphylococcus aureus bacteremia with sepsis ([place])   . Subdural hematoma ([place]) 03/30/11  . Urinary frequency    wears depends    Past Surgical History:  Procedure Laterality Date  . epidural abscess drainage  12/2010  . 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 EYE  SURGERY     bil cataract surgery  . FILTERING PROCEDURE     filter placed in neck d/t blood  clot  . HERNIA REPAIR     25+yrs ago  . KNEE ARTHROSCOPY    . SKIN BIOPSY     melanoma on head   . TOTAL KNEE ARTHROPLASTY     x 2 left knee 2003/2012  . TOTAL KNEE REVISION  05/23/2011   Procedure: TOTAL KNEE REVISION;  Surgeon: Meredith Pel;  Location: [facility];  Service: Orthopedics;  Laterality: Left;  LEFT KNEE REMOVAL OF SPACER, POSSIBLE REIMPLANTATION OF REVISION TKA VERSES REPLACEMENT ANTIBIOTIC SPACER    Social History  Substance Use Topics  . Smoking status: Former Smoker    Packs/day: 2.00    Years: 40.00    Types: Cigarettes    Quit date: 07/17/1978  . Smokeless tobacco: Never Used  . Alcohol use No    Family History  Problem Relation Age of Onset  . Melanoma Brother   . CAD Neg Hx   . Diabetes Neg Hx     Allergies  Allergen Reactions  . Anticoagulant Compound     History of bleeding on the brain per daughter  . Protonix [Pantoprazole Sodium] Diarrhea    Medication list has been reviewed and updated.  Current Outpatient Prescriptions on File Prior to Visit  Medication Sig Dispense Refill  . acetaminophen (TYLENOL) 325 MG tablet Take 2 tablets (650 mg total) by mouth every 6 (six) hours as needed for mild pain (or Fever >/= 101). (Patient not taking: Reported on 02/23/2016) 30 tablet 0  . albuterol (PROAIR HFA) 108 (90 BASE) MCG/ACT inhaler Inhale 2 puffs into the lungs every 6 (six) hours as needed for wheezing. 1 Inhaler 1  . benzonatate (TESSALON) 200 MG capsule TAKE ONE CAPSULE 3-4 TIMES DAILY AS NEEDED FOR COUGH (Patient taking differently: Take 200 mg by mouth 2 (two) times daily as needed for cough. TAKE ONE CAPSULE 3-4 TIMES DAILY AS NEEDED FOR COUGH) 60 capsule 6  . budesonide-formoterol (SYMBICORT) 160-4.5 MCG/ACT inhaler Inhale 2 puffs into the lungs 2 (two) times daily.    Marland Kitchen buPROPion (WELLBUTRIN SR) 200 MG 12 hr tablet Take 1 tablet (200 mg total) by mouth  2 (two) times daily. 60 tablet 6  . calcium carbonate (OS-CAL) 600 MG TABS Take 600 mg by mouth daily.     . ciprofloxacin (CIPRO) 250 MG tablet Take 2 tablets (500 mg total) by mouth daily. 7 tablet 0  . Cranberry-Vitamin C (AZO CRANBERRY URINARY TRACT) 250-60 MG CAPS Take 1 tablet by mouth daily.    . diazepam (VALIUM) 5 MG tablet Take 5 mg  by mouth every 6 (six) hours as needed for anxiety. Reported on 08/04/2015    . doxazosin (CARDURA) 4 MG tablet Take 1 tablet (4 mg total) by mouth at bedtime. 30 tablet 6  . fluticasone (FLONASE) 50 MCG/ACT nasal spray Place 2 sprays into the nose 2 (two) times daily. 16 g 6  . meclizine (ANTIVERT) 25 MG tablet Take 25 mg by mouth daily as needed (vertigo). Reported on 08/04/2015    . montelukast (SINGULAIR) 10 MG tablet Take 1 tablet (10 mg total) by mouth daily. Reported on 08/04/2015 90 tablet 0  . Multiple Vitamin (MULTIVITAMIN WITH MINERALS) TABS Take 1 tablet by mouth daily.    Marland Kitchen rOPINIRole (REQUIP) 1 MG tablet Take 1 tablet (1 mg total) by mouth at bedtime. 30 tablet 6  . saccharomyces boulardii (FLORASTOR) 250 MG capsule Take 1 capsule (250 mg total) by mouth 2 (two) times daily. 30 capsule 0  . simvastatin (ZOCOR) 40 MG tablet Take 1 tablet (40 mg total) by mouth at bedtime. 90 tablet 3  . URELLE (URELLE/URISED) 81 MG TABS tablet Take 1 tablet (81 mg total) by mouth every 6 (six) hours as needed for bladder spasms. 60 each 0   No current facility-administered medications on file prior to visit.     Review of Systems:  As per HPI- otherwise negative.  Daughter noted that they are getting plenty of urine drainage into his leg bag   Physical Examination: Vitals:   02/23/16 1311  BP: (!) 109/54  Pulse: 78  Temp: 97.8 F (36.6 C)   BP Readings from Last 3 Encounters:  02/23/16 (!) 109/54  02/22/16 120/72  02/19/16 134/69    GEN: WDWN, NAD, Non-toxic, A & O x 3, elderly man accompanied by his daughter HEENT: Atraumatic, Normocephalic. Neck  supple. No masses, No LAD. Ears and Nose: No external deformity. CV: RRR, No M/G/R. No JVD. No thrill. No extra heart sounds. PULM: CTA B, no wheezes, crackles, rhonchi. No retractions. No resp. distress. No accessory muscle use. ABD: S, NT, ND, +BS. No rebound. No HSM.  Belly is benign EXTR: No c/c/e NEURO slow gait, uses a walker Gu: foley cathter in place.  No leakage noted.  Pressed gently on bladder- no pain or leakage.  Foley appears to be the proper size for his urethra.  Leg bag in place.  No trauma to penis evident PSYCH: Normally interactive. Conversant. Not depressed or anxious appearing.  Calm demeanor.   appt Friday at [facility]- urology Assessment and Plan: Leakage from urinary catheter, initial encounter [facility])  Discussed with RN at [facility] urology- we were able to move his appt up to Friday morning.  We think that his leakage may be from bladder spasm. At this time there is not a lot to do unfortunately, but he does seem to be overall getting better.  They will let me know if anything else is needed and I will complete home health order per daughter's request   Signed Lamar Blinks, MD

## 2016-02-23 NOTE — Telephone Encounter (Signed)
I'm not sure if we can help with a catheter leak, he needs to see urology, could you arrange a prompt urology visit  instead of he coming this way?

## 2016-02-25 LAB — CULTURE, BLOOD (ROUTINE X 2)
CULTURE: NO GROWTH
Culture: NO GROWTH
Culture: NO GROWTH
Culture: NO GROWTH

## 2016-03-01 ENCOUNTER — Ambulatory Visit (INDEPENDENT_AMBULATORY_CARE_PROVIDER_SITE_OTHER): Payer: Medicare Other | Admitting: Internal Medicine

## 2016-03-01 ENCOUNTER — Telehealth: Payer: Self-pay

## 2016-03-01 ENCOUNTER — Encounter: Payer: Self-pay | Admitting: Internal Medicine

## 2016-03-01 VITALS — BP 122/62 | HR 67 | Temp 97.7°F | Resp 14 | Ht 70.0 in | Wt 162.2 lb

## 2016-03-01 DIAGNOSIS — N390 Urinary tract infection, site not specified: Secondary | ICD-10-CM

## 2016-03-01 DIAGNOSIS — H8109 Meniere's disease, unspecified ear: Secondary | ICD-10-CM | POA: Diagnosis not present

## 2016-03-01 DIAGNOSIS — R339 Retention of urine, unspecified: Secondary | ICD-10-CM

## 2016-03-01 DIAGNOSIS — A419 Sepsis, unspecified organism: Secondary | ICD-10-CM

## 2016-03-01 MED ORDER — TAMSULOSIN HCL 0.4 MG PO CAPS: 0.4000 mg | ORAL_CAPSULE | Freq: Every day | ORAL | 3 refills | Status: DC

## 2016-03-01 MED ORDER — MECLIZINE HCL 25 MG PO TABS: 25.0000 mg | ORAL_TABLET | Freq: Every day | ORAL | 2 refills | Status: DC | PRN

## 2016-03-01 MED ORDER — DIAZEPAM 5 MG PO TABS: 5.0000 mg | ORAL_TABLET | Freq: Four times a day (QID) | ORAL | 2 refills | Status: DC | PRN

## 2016-03-01 MED ORDER — HYDROCORTISONE 2.5 % EX CREA: TOPICAL_CREAM | Freq: Two times a day (BID) | CUTANEOUS | 0 refills | Status: AC

## 2016-03-01 NOTE — Progress Notes (Signed)
Pre visit review using our clinic review tool, if applicable. No additional management support is needed unless otherwise documented below in the visit note. 

## 2016-03-01 NOTE — Telephone Encounter (Signed)
PA initiated via [email or web address]; [id number]. Awaiting determination.

## 2016-03-01 NOTE — Patient Instructions (Signed)
GO TO THE FRONT DESK Schedule your next appointment for a  checkup in 2 or 3 months  Start Flomax to prevent a urinary retention. Watch for excessive drowsiness and low blood pressures  Drink plenty of fluids   Check the  blood pressure weekly Be sure your blood pressure is between 110/65 and  145/85. If it is consistently higher or lower, let me know

## 2016-03-01 NOTE — Progress Notes (Signed)
Subjective:    Patient ID: Micheal Frank, male    DOB: November 12, 1925, 82 y.o.   MRN: [id number]  DOS:  03/01/2016 Type of visit - description : rov Interval history:  He was admitted from 8/5 to 8/8 with urinary retention and urosepsis.   Urine culture grew pseudomonas , he was discharged home on cipro  Last labs: 02/21/2016, potassium 3.3, creatinine 1.57, white count 11.2, hemoglobin 13. Chest x-ray at the hospital normal  After the hospital admission, he developed a Foley leak, was seen here and subsequently @ urology. Foley remove about 5 days ago, no problems since then.  Review of Systems No fever, chills. Appetite is okay No diarrhea. No cough Has a history of incontinence but here lately that has not been a problem. Flow is good, no gross hematuria. He complained of itching arms.   Past Medical History:  Diagnosis Date  . Arthritis    left leg also has swelling  . Asthma   . Blood dyscrasia    pt CAN NOT have any blood thinners d/t hx brain bleed  . Chronic kidney disease, stage III (moderate) 01/20/2013  . Complication of anesthesia    confusion with anesthesia  . Constipation    miralax prn  . COPD (chronic obstructive pulmonary disease) (HCC)    emphysema  . Depression    takes wellbutrin bid  . Epidural abscess   . Gastric ulcer   . History of blood clots   . Hyperlipidemia   . Melanoma ([place])   . Peripheral neuropathy ([place])   . Pneumonia    as a child and in 1952;had a pneumonia vaccine couple of years ago  . Prosthetic joint infection ([place])   . S/P right heart catheterization    at least 67yrs  . Shingles   . Short-term memory loss   . Shortness of breath    with exertion  . Sleep apnea    has CPAP but doesn't use it;sleep study done at least 72yrs ago  . Staphylococcus aureus bacteremia with sepsis ([place])   . Subdural hematoma ([place]) 03/30/11  . Urinary frequency    wears depends    Past Surgical History:  Procedure Laterality Date  . epidural  abscess drainage  12/2010  . 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 EYE SURGERY     bil cataract surgery  . FILTERING PROCEDURE     filter placed in neck d/t blood  clot  . HERNIA REPAIR     25+yrs ago  . KNEE ARTHROSCOPY    . SKIN BIOPSY     melanoma on head   . TOTAL KNEE ARTHROPLASTY     x 2 left knee 2003/2012  . TOTAL KNEE REVISION  05/23/2011   Procedure: TOTAL KNEE REVISION;  Surgeon: Meredith Pel;  Location: [facility];  Service: Orthopedics;  Laterality: Left;  LEFT KNEE REMOVAL OF SPACER, POSSIBLE REIMPLANTATION OF REVISION TKA VERSES REPLACEMENT ANTIBIOTIC SPACER    Social History   Social History  . Marital status: Married    Spouse name: N/A  . Number of children: 3  . Years of education: N/A   Occupational History  . retired    Social History Main Topics  . Smoking status: Former Smoker    Packs/day: 2.00    Years: 40.00    Types: Cigarettes    Quit date: 07/17/1978  . Smokeless tobacco: Never Used  . Alcohol use No  .  Drug use: No  . Sexual activity: Not on file   Other Topics Concern  . Not on file   Social History Narrative   Lost wife 06-2013   Lives in his house, 1 daughter and her 2 adult children live w/ him (1g-child w/ special needs)           Medication List       Accurate as of 03/01/16 11:59 PM. Always use your most recent med list.          acetaminophen 325 MG tablet Commonly known as:  TYLENOL Take 2 tablets (650 mg total) by mouth every 6 (six) hours as needed for mild pain (or Fever >/= 101).   albuterol 108 (90 Base) MCG/ACT inhaler Commonly known as:  PROAIR HFA Inhale 2 puffs into the lungs every 6 (six) hours as needed for wheezing.   AZO CRANBERRY URINARY TRACT 250-60 MG Caps Generic drug:  Cranberry-Vitamin C Take 1 tablet by mouth daily.   benzonatate 200 MG capsule Commonly known as:  TESSALON TAKE ONE CAPSULE 3-4 TIMES DAILY AS NEEDED FOR COUGH   budesonide-formoterol 160-4.5  MCG/ACT inhaler Commonly known as:  SYMBICORT Inhale 2 puffs into the lungs 2 (two) times daily.   buPROPion 200 MG 12 hr tablet Commonly known as:  WELLBUTRIN SR Take 1 tablet (200 mg total) by mouth 2 (two) times daily.   calcium carbonate 600 MG Tabs tablet Commonly known as:  OS-CAL Take 600 mg by mouth daily.   diazepam 5 MG tablet Commonly known as:  VALIUM Take 1 tablet (5 mg total) by mouth every 6 (six) hours as needed for anxiety.   doxazosin 4 MG tablet Commonly known as:  CARDURA Take 1 tablet (4 mg total) by mouth at bedtime.   fluticasone 50 MCG/ACT nasal spray Commonly known as:  FLONASE Place 2 sprays into the nose 2 (two) times daily.   hydrocortisone 2.5 % cream Apply topically 2 (two) times daily.   meclizine 25 MG tablet Commonly known as:  ANTIVERT Take 1 tablet (25 mg total) by mouth daily as needed (vertigo).   montelukast 10 MG tablet Commonly known as:  SINGULAIR Take 1 tablet (10 mg total) by mouth daily. Reported on 08/04/2015   multivitamin with minerals Tabs tablet Take 1 tablet by mouth daily.   rOPINIRole 1 MG tablet Commonly known as:  REQUIP Take 1 tablet (1 mg total) by mouth at bedtime.   saccharomyces boulardii 250 MG capsule Commonly known as:  FLORASTOR Take 1 capsule (250 mg total) by mouth 2 (two) times daily.   simvastatin 40 MG tablet Commonly known as:  ZOCOR Take 1 tablet (40 mg total) by mouth at bedtime.   tamsulosin 0.4 MG Caps capsule Commonly known as:  FLOMAX Take 1 capsule (0.4 mg total) by mouth daily.   URELLE 81 MG Tabs tablet Take 1 tablet (81 mg total) by mouth every 6 (six) hours as needed for bladder spasms.          Objective:   Physical Exam BP 122/62 (BP Location: Left Arm, Patient Position: Sitting, Cuff Size: Small)   Pulse 67   Temp 97.7 F (36.5 C) (Oral)   Resp 14   Ht 5\' 10"  (1.778 m)   Wt 162 lb 4 oz (73.6 kg)   SpO2 97%   BMI 23.28 kg/m  General:   Well developed, well  nourished . NAD.  HEENT:  Normocephalic . Face symmetric, atraumatic Lungs:  CTA B Normal respiratory  effort, no intercostal retractions, no accessory muscle use. Heart: Systolic murmur.  No pretibial edema bilaterally  Abdomen: Not distended, nontender Rectal:  External abnormalities: none. Normal sphincter tone. No rectal masses or tenderness.  Stool brown  Prostate: Left side of the prostate is a slightly enlarged and soft. Not tender Right side of the prostate is larger, firm to hard. Not tender. Skin: Very thin skin at the arms, some ecchymosis, slightly red but not warm. Neurologic:  alert & oriented X3.  Speech normal, gait appropriate for age and unassisted Psych--  Cognition and judgment appear intact.  Cooperative with normal attention span and concentration.  Behavior appropriate. No anxious or depressed appearing.      Assessment & Plan:    Assessment > COPD- Dr Melvyn Novas 08-2015, stable, RTC PRN CKD -----Creatinine 1.67, GFR 32 Hyperlipidemia Depression Decreased memory Neuropathy H/o Mnire's  -- on diuretics , valium, meclizine Osteoporosis -- h/o pelvic Fx, dexa April 2016: T score (-) 2.3; prolia-- not covered, rx fosamax 03-2015, dc after 3 months d/t dysphagia DJD  -- hydrocodone rarely RLS OSA no CPAP GU: --H/o incontinence, saw Dr Karsten Ro, OV ~ 2013 --Urinary retention 02-2016: sepsis, admitted, had a foley temporarily  H/o melanoma H/o subdural hematoma, epidural abscess, staph aureus  sepsis H/o Prosthetic joint infection H/o Blood clots H/o gastric ulcer C diff dx 10-19-15, s/p flagyl x 2  Coordinate care with his daughter [name]  [phone]  PLAN: S/p urinary retention, sepsis: Patient's daughter main concern is if they need to see the urologist and if they can do anything to prevent another urinary retention. I perform a digital rectal exam, I suspect he has prostate cancer based on DRE, this was communicated to the daughter, she requested me not  to tell   the patient. Given age and comorbidities we agreed on no aggressive workup and simply observation for the enlarged prostate. To prevent another urinary retention we agreed on a trial with Flomax,s/e discussed. If needed refer to urology. Pseudomona  UTI, S/P Cipro, now asx. Mnire's: Diuretics were stopped at the hospital, restart them prn. Refill Valium and meclizine which he uses very seldom Dry itchy and slightly red skin at the arms: Doubt cellulitis, will prescribe hydrocortisone 2.5%. Call if open sores or fevers. RTC 3 months  Today, I spent more than 33   min with the patient: >50% of the time counseling regards BPH, UTI prevention, possible diagnosis of prostate cancer, also reviewing the chart and labs ordered by other providers

## 2016-03-02 NOTE — Assessment & Plan Note (Signed)
S/p urinary retention, sepsis: Patient's daughter main concern is if they need to see the urologist and if they can do anything to prevent another urinary retention. I perform a digital rectal exam, I suspect he has prostate cancer based on DRE, this was communicated to the daughter, she requested me not to tell   the patient. Given age and comorbidities we agreed on no aggressive workup and simply observation for the enlarged prostate. To prevent another urinary retention we agreed on a trial with Flomax,s/e discussed. If needed refer to urology. Pseudomona  UTI, S/P Cipro, now asx. Mnire's: Diuretics were stopped at the hospital, restart them prn. Refill Valium and meclizine which he uses very seldom Dry itchy and slightly red skin at the arms: Doubt cellulitis, will prescribe hydrocortisone 2.5%. Call if open sores or fevers. RTC 3 months

## 2016-03-02 NOTE — Telephone Encounter (Signed)
Will await appeal information.

## 2016-03-02 NOTE — Telephone Encounter (Signed)
Rhea from [place] called in to give a denial of medication.    Due to a non - FDA approved use. They will send a letter for an appeal    [phone]

## 2016-03-06 NOTE — Telephone Encounter (Signed)
Pt and family interested in starting Prolia, Adeline, Pt's daughter, estimated cost of Prolia every 6 months is $192. Instructed [name] to call if they are interested in starting Prolia or if she has any questions or concerns.

## 2016-03-07 NOTE — Telephone Encounter (Signed)
[place], Pt's daughter, to return call at her convenience. PA denial letter sent for scanning.

## 2016-03-07 NOTE — Telephone Encounter (Signed)
Received denial letter for Diazepam, no alternatives given. Coverage denied because medication requested is not FDA labeled or medically accepted use for Meniere's disease. Appeal Re-determination forms provided. Please advise.

## 2016-03-07 NOTE — Telephone Encounter (Signed)
Please notify the family, he uses it very infrequently, recommend to buy out of pocket.

## 2016-03-08 ENCOUNTER — Other Ambulatory Visit: Payer: Self-pay | Admitting: Internal Medicine

## 2016-03-09 ENCOUNTER — Telehealth: Payer: Self-pay | Admitting: Internal Medicine

## 2016-03-09 MED ORDER — SACCHAROMYCES BOULARDII 250 MG PO CAPS: 250.0000 mg | ORAL_CAPSULE | Freq: Two times a day (BID) | ORAL | 0 refills | Status: DC

## 2016-03-09 NOTE — Telephone Encounter (Signed)
Rx sent 

## 2016-03-09 NOTE — Telephone Encounter (Signed)
Please advise. Should Pt continue Florastor?

## 2016-03-09 NOTE — Telephone Encounter (Signed)
Caller name: [name] Relationship to patient: Daughter Can be reached: [phone]  Pharmacy:  [id number], [place] [address] [id number] (Phone) [phone] (Fax)   Reason for call: Request refill on ProBiotics

## 2016-03-09 NOTE — Telephone Encounter (Signed)
Status post ciprofloxacin, finished about a week ago, to take probiotics for 3 additional weeks

## 2016-03-21 ENCOUNTER — Ambulatory Visit: Payer: Medicare Other | Attending: Internal Medicine | Admitting: Physical Therapy

## 2016-03-21 ENCOUNTER — Encounter: Payer: Self-pay | Admitting: Physical Therapy

## 2016-03-21 DIAGNOSIS — M6281 Muscle weakness (generalized): Secondary | ICD-10-CM | POA: Diagnosis present

## 2016-03-21 DIAGNOSIS — R262 Difficulty in walking, not elsewhere classified: Secondary | ICD-10-CM | POA: Insufficient documentation

## 2016-03-21 DIAGNOSIS — R296 Repeated falls: Secondary | ICD-10-CM | POA: Insufficient documentation

## 2016-03-21 NOTE — Therapy (Signed)
[place], [place], [address] Phone: [phone]   Fax:  [phone]  Physical Therapy Evaluation  Patient Details  Name: Micheal Frank MRN: [id number] Date of Birth: 1925/08/26 Referring Provider: Kathlene November  Encounter Date: 03/21/2016      PT End of Session - 03/21/16 1328    Visit Number 1   Date for PT Re-Evaluation 05/21/16   PT Start Time 1247   PT Stop Time 1338   PT Time Calculation (min) 51 min   Activity Tolerance Patient tolerated treatment well;Patient limited by fatigue   Behavior During Therapy [facility] for tasks assessed/performed      Past Medical History:  Diagnosis Date  . Arthritis    left leg also has swelling  . Asthma   . Blood dyscrasia    pt CAN NOT have any blood thinners d/t hx brain bleed  . Chronic kidney disease, stage III (moderate) 01/20/2013  . Complication of anesthesia    confusion with anesthesia  . Constipation    miralax prn  . COPD (chronic obstructive pulmonary disease) (HCC)    emphysema  . Depression    takes wellbutrin bid  . Epidural abscess   . Gastric ulcer   . History of blood clots   . Hyperlipidemia   . Melanoma ([place])   . Peripheral neuropathy ([place])   . Pneumonia    as a child and in 1952;had a pneumonia vaccine couple of years ago  . Prosthetic joint infection ([place])   . S/P right heart catheterization    at least 21yrs  . Shingles   . Short-term memory loss   . Shortness of breath    with exertion  . Sleep apnea    has CPAP but doesn't use it;sleep study done at least 63yrs ago  . Staphylococcus aureus bacteremia with sepsis ([place])   . Subdural hematoma ([place]) 03/30/11  . Urinary frequency    wears depends    Past Surgical History:  Procedure Laterality Date  . epidural abscess drainage  12/2010  . 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 EYE SURGERY     bil cataract surgery  . FILTERING PROCEDURE      filter placed in neck d/t blood  clot  . HERNIA REPAIR     25+yrs ago  . KNEE ARTHROSCOPY    . SKIN BIOPSY     melanoma on head   . TOTAL KNEE ARTHROPLASTY     x 2 left knee 2003/2012  . TOTAL KNEE REVISION  05/23/2011   Procedure: TOTAL KNEE REVISION;  Surgeon: Meredith Pel;  Location: [place];  Service: Orthopedics;  Laterality: Left;  LEFT KNEE REMOVAL OF SPACER, POSSIBLE REIMPLANTATION OF REVISION TKA VERSES REPLACEMENT ANTIBIOTIC SPACER    There were no vitals filed for this visit.       Subjective Assessment - 03/21/16 1251    Subjective Patient reports at least 3 falls in the past 6 months, most recent being in July, no significant injuries with these falls.  He reports that he has fallen inside the home and outside the home.  He uses a walker at night and in the AM, other times he uses a SPC.   Patient is accompained by: Family member   Limitations Walking   Patient Stated Goals not to fall   Currently in Pain? No/denies  [facility] PT Assessment - 03/21/16 0001      Assessment   Medical Diagnosis repeated falls   Referring Provider Kathlene November   Onset Date/Surgical Date 02/19/16   Prior Therapy about 2 years ago     Precautions   Precautions Fall     Balance Screen   Has the patient fallen in the past 6 months Yes   How many times? 3   Has the patient had a decrease in activity level because of a fear of falling?  Yes     Home Environment   Additional Comments lives with family, has stairs at home, some steps into the home, he does a little bit of yardwork     Prior Function   Level of Independence Independent with household mobility with device   Vocation Retired   Leisure does not exercise     ROM / Strength   AROM / PROM / Strength AROM;Strength     AROM   Overall AROM Comments WFL's some mild knee flexion contractures     Strength   Overall Strength Comments 3+/5 for the LE's, UE's 4-/5     Ambulation/Gait   Gait Comments uses a SPC,  slow, some shuffling steps, very difficulty turning around, loses balance     Standardized Balance Assessment   Standardized Balance Assessment Timed Up and Go Test;Berg Balance Test     Berg Balance Test   Sit to Stand Able to stand  independently using hands   Standing Unsupported Able to stand safely 2 minutes   Sitting with Back Unsupported but Feet Supported on Floor or Stool Able to sit safely and securely 2 minutes   Stand to Sit Sits safely with minimal use of hands   Transfers Able to transfer safely, definite need of hands   Standing Unsupported with Eyes Closed Able to stand 10 seconds with supervision   Standing Ubsupported with Feet Together Able to place feet together independently but unable to hold for 30 seconds   From Standing, Reach Forward with Outstretched Arm Can reach forward >12 cm safely (5")   From Standing Position, Pick up Object from Floor Able to pick up shoe, needs supervision   From Standing Position, Turn to Look Behind Over each Shoulder Looks behind one side only/other side shows less weight shift   Turn 360 Degrees Able to turn 360 degrees safely but slowly   Standing Unsupported, Alternately Place Feet on Step/Stool Able to complete 4 steps without aid or supervision   Standing Unsupported, One Foot in Front Able to take small step independently and hold 30 seconds   Standing on One Leg Tries to lift leg/unable to hold 3 seconds but remains standing independently   Total Score 39     Timed Up and Go Test   Manual TUG (seconds) 29                   [facility] Adult PT Treatment/Exercise - 03/21/16 0001      High Level Balance   High Level Balance Activities Side stepping;Backward walking   High Level Balance Comments ball tossing     Exercises   Exercises Knee/Hip     Knee/Hip Exercises: Aerobic   Nustep Level 5 x 5 minutes after this his O2 saturation was 91% and HR was 106 bpm                  PT Short Term Goals - 03/21/16  1331  PT SHORT TERM GOAL #1   Title Independent with initial HEP   Time 2   Period Weeks   Status New           PT Long Term Goals - 03/21/16 1341      PT LONG TERM GOAL #1   Title decrease TUG time to 20 seconds   Time 8   Period Weeks   Status New     PT LONG TERM GOAL #2   Title increase Berg balance test score to 46/56   Time 8   Period Weeks   Status New     PT LONG TERM GOAL #3   Title no falls over a 4 week period   Time 8   Period Weeks   Status New     PT LONG TERM GOAL #4   Title get in and out of car safely and without difficulty   Time 8   Period Weeks   Status New               Plan - 03/21/16 1329    Clinical Impression Statement Patient with 3 falls in the past 6 months.  Walks with a walker at night and in the AM, then switches to [facility], TUG time was 29 seconds, Berg balance test score was 39/56 putting him at high risk for falls.  Some weakness in the LE's   Rehab Potential Good   PT Frequency 2x / week   PT Duration 8 weeks   PT Treatment/Interventions ADLs/Self Care Home Management;Gait training;Stair training;Functional mobility training;Therapeutic activities;Therapeutic exercise;Balance training;Patient/family education;Manual techniques   PT Next Visit Plan slowly add exercises and balance activities   Consulted and Agree with Plan of Care Patient;Family member/caregiver   Family Member Consulted daughter      Patient will benefit from skilled therapeutic intervention in order to improve the following deficits and impairments:  Abnormal gait, Cardiopulmonary status limiting activity, Decreased balance, Decreased mobility, Decreased endurance, Decreased safety awareness, Decreased strength, Difficulty walking  Visit Diagnosis: Repeated falls - Plan: PT plan of care cert/re-cert  Difficulty in walking, not elsewhere classified - Plan: PT plan of care cert/re-cert  Muscle weakness (generalized) - Plan: PT plan of care  cert/re-cert      G-Codes - [id number]    Functional Assessment Tool Used foto 60% limitation   Functional Limitation Mobility: Walking and moving around   Mobility: Walking and Moving Around Current Status [phone]) At least 60 percent but less than 80 percent impaired, limited or restricted   Mobility: Walking and Moving Around Goal Status [phone]) At least 40 percent but less than 60 percent impaired, limited or restricted       Problem List Patient Active Problem List   Diagnosis Date Noted  . Hypotension 02/20/2016  . Sepsis secondary to UTI ([place]) 02/20/2016  . Sepsis ([place]) 02/20/2016  . Urinary retention 02/20/2016  . Chronic cough 08/16/2015  . PCP NOTES >>>>>> 04/06/2015  . CTS (carpal tunnel syndrome) 11/16/2014  . Hyperlipidemia 08/17/2014  . Sleep apnea 08/17/2014  . Superficial bruising of chest wall 07/29/2014  . Urgency incontinence 01/23/2013  . Chronic kidney disease, stage III (moderate) 01/20/2013  . At high risk for falls 11/28/2012  . ALLERGIC RHINITIS 12/09/2009  . CARCINOMA IN SITU, SKIN 04/30/2008  . RESTLESS LEG SYNDROME 04/30/2008  . NEUROPATHY 04/30/2008  . GLAUCOMA, BORDERLINE 04/30/2008  . Meniere's disease 04/30/2008  . HEARING LOSS 04/30/2008  . COPD GOLD II  04/30/2008  . DJD (  degenerative joint disease) 04/30/2008  . Osteoporosis 04/30/2008  . SLEEP APNEA 04/30/2008  . HISTORY OF ASBESTOS EXPOSURE 04/30/2008    Sumner Boast., PT 03/21/2016, 1:47 PM  [address] [place], [place], [address] Phone: [phone]   Fax:  [phone]  Name: DANYELL HASER MRN: [id number] Date of Birth: Dec 10, 1925

## 2016-03-23 ENCOUNTER — Ambulatory Visit: Payer: Medicare Other | Admitting: Physical Therapy

## 2016-03-23 ENCOUNTER — Encounter: Payer: Self-pay | Admitting: Physical Therapy

## 2016-03-23 DIAGNOSIS — R296 Repeated falls: Secondary | ICD-10-CM

## 2016-03-23 DIAGNOSIS — R262 Difficulty in walking, not elsewhere classified: Secondary | ICD-10-CM

## 2016-03-23 DIAGNOSIS — M6281 Muscle weakness (generalized): Secondary | ICD-10-CM

## 2016-03-23 NOTE — Therapy (Signed)
[place], [place], [address] Phone: [phone]   Fax:  [phone]  Physical Therapy Treatment  Patient Details  Name: Micheal Frank MRN: [id number] Date of Birth: [birth year] Referring Provider: Kathlene November  Encounter Date: 03/23/2016      PT End of Session - 03/23/16 1744    Visit Number 2   Date for PT Re-Evaluation 05/21/16   PT Start Time 1652   PT Stop Time 1735   PT Time Calculation (min) 43 min   Activity Tolerance Patient tolerated treatment well   Behavior During Therapy [facility] for tasks assessed/performed      Past Medical History:  Diagnosis Date  . Arthritis    left leg also has swelling  . Asthma   . Blood dyscrasia    pt CAN NOT have any blood thinners d/t hx brain bleed  . Chronic kidney disease, stage III (moderate) 01/20/2013  . Complication of anesthesia    confusion with anesthesia  . Constipation    miralax prn  . COPD (chronic obstructive pulmonary disease) (HCC)    emphysema  . Depression    takes wellbutrin bid  . Epidural abscess   . Gastric ulcer   . History of blood clots   . Hyperlipidemia   . Melanoma ([place])   . Peripheral neuropathy ([place])   . Pneumonia    as a child and in 1952;had a pneumonia vaccine couple of years ago  . Prosthetic joint infection ([place])   . S/P right heart catheterization    at least 63yrs  . Shingles   . Short-term memory loss   . Shortness of breath    with exertion  . Sleep apnea    has CPAP but doesn't use it;sleep study done at least 3yrs ago  . Staphylococcus aureus bacteremia with sepsis ([place])   . Subdural hematoma ([place]) 03/30/11  . Urinary frequency    wears depends    Past Surgical History:  Procedure Laterality Date  . epidural abscess drainage  12/2010  . 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 EYE SURGERY     bil cataract surgery  . FILTERING PROCEDURE     filter placed in neck  d/t blood  clot  . HERNIA REPAIR     25+yrs ago  . KNEE ARTHROSCOPY    . SKIN BIOPSY     melanoma on head   . TOTAL KNEE ARTHROPLASTY     x 2 left knee 2003/2012  . TOTAL KNEE REVISION  05/23/2011   Procedure: TOTAL KNEE REVISION;  Surgeon: Meredith Pel;  Location: [place];  Service: Orthopedics;  Laterality: Left;  LEFT KNEE REMOVAL OF SPACER, POSSIBLE REIMPLANTATION OF REVISION TKA VERSES REPLACEMENT ANTIBIOTIC SPACER    There were no vitals filed for this visit.      Subjective Assessment - 03/23/16 1646    Subjective Reports he was a little tired after last visit.  No issues at home   Currently in Pain? No/denies                         [facility] Adult PT Treatment/Exercise - 03/23/16 0001      Ambulation/Gait   Gait Comments use of a SPC and no device on level surfaces, used stairs with hand rail up and down, then on grass and uneven surfaces outside  High Level Balance   High Level Balance Activities Side stepping;Backward walking;Tandem walking;Head turns   High Level Balance Comments ball tossing, solid surface and then dynamic surface     Knee/Hip Exercises: Stretches   Gastroc Stretch 3 reps;20 seconds     Knee/Hip Exercises: Aerobic   Nustep Level 5 x 6 minutes     Knee/Hip Exercises: Machines for Strengthening   Cybex Knee Extension 5# 2x10   Cybex Knee Flexion 20# 2x10   Cybex Leg Press 20# 2x10     Knee/Hip Exercises: Standing   Hip Flexion 20 reps;2 sets   Hip Flexion Limitations 3#   Hip Abduction 20 reps;2 sets   Abduction Limitations 3#                  PT Short Term Goals - 03/21/16 1331      PT SHORT TERM GOAL #1   Title Independent with initial HEP   Time 2   Period Weeks   Status New           PT Long Term Goals - 03/21/16 1341      PT LONG TERM GOAL #1   Title decrease TUG time to 20 seconds   Time 8   Period Weeks   Status New     PT LONG TERM GOAL #2   Title increase Berg balance test score to  46/56   Time 8   Period Weeks   Status New     PT LONG TERM GOAL #3   Title no falls over a 4 week period   Time 8   Period Weeks   Status New     PT LONG TERM GOAL #4   Title get in and out of car safely and without difficulty   Time 8   Period Weeks   Status New               Plan - 03/23/16 1745    Clinical Impression Statement Patient did very well today, his biggest issue is looking up and when on uneven surfaces.   PT Next Visit Plan slowly add exercises and balance activities   Family Member Consulted daughter      Patient will benefit from skilled therapeutic intervention in order to improve the following deficits and impairments:  Abnormal gait, Cardiopulmonary status limiting activity, Decreased balance, Decreased mobility, Decreased endurance, Decreased safety awareness, Decreased strength, Difficulty walking  Visit Diagnosis: Repeated falls  Difficulty in walking, not elsewhere classified  Muscle weakness (generalized)     Problem List Patient Active Problem List   Diagnosis Date Noted  . Hypotension 02/20/2016  . Sepsis secondary to UTI ([place]) 02/20/2016  . Sepsis ([place]) 02/20/2016  . Urinary retention 02/20/2016  . Chronic cough 08/16/2015  . PCP NOTES >>>>>> 04/06/2015  . CTS (carpal tunnel syndrome) 11/16/2014  . Hyperlipidemia 08/17/2014  . Sleep apnea 08/17/2014  . Superficial bruising of chest wall 07/29/2014  . Urgency incontinence 01/23/2013  . Chronic kidney disease, stage III (moderate) 01/20/2013  . At high risk for falls 11/28/2012  . ALLERGIC RHINITIS 12/09/2009  . CARCINOMA IN SITU, SKIN 04/30/2008  . RESTLESS LEG SYNDROME 04/30/2008  . NEUROPATHY 04/30/2008  . GLAUCOMA, BORDERLINE 04/30/2008  . Meniere's disease 04/30/2008  . HEARING LOSS 04/30/2008  . COPD GOLD II  04/30/2008  . DJD (degenerative joint disease) 04/30/2008  . Osteoporosis 04/30/2008  . SLEEP APNEA 04/30/2008  . HISTORY OF ASBESTOS EXPOSURE 04/30/2008     Sumner Boast., PT  03/23/2016, 5:46 PM  [place], [place], [address] Phone: [phone]   Fax:  [phone]  Name: Micheal Frank MRN: [id number] Date of Birth: [birth year]

## 2016-03-28 ENCOUNTER — Telehealth: Payer: Self-pay

## 2016-03-28 ENCOUNTER — Ambulatory Visit: Payer: Medicare Other | Admitting: Physical Therapy

## 2016-03-28 NOTE — Telephone Encounter (Signed)
Patient came in for appointment and reported he did not feel well, his O2 saturation was checked by PT at it was 90%, PT gave him pursed lip breathing exercises.  This helped get his O2 saturation up to 95%.  His BP was 115/58.  He decided to not be seen today

## 2016-03-30 ENCOUNTER — Ambulatory Visit: Payer: Medicare Other | Admitting: Physical Therapy

## 2016-04-03 ENCOUNTER — Encounter: Payer: Self-pay | Admitting: Physical Therapy

## 2016-04-03 ENCOUNTER — Ambulatory Visit: Payer: Medicare Other | Admitting: Physical Therapy

## 2016-04-03 DIAGNOSIS — R262 Difficulty in walking, not elsewhere classified: Secondary | ICD-10-CM

## 2016-04-03 DIAGNOSIS — M6281 Muscle weakness (generalized): Secondary | ICD-10-CM

## 2016-04-03 DIAGNOSIS — R296 Repeated falls: Secondary | ICD-10-CM | POA: Diagnosis not present

## 2016-04-03 NOTE — Therapy (Signed)
[place], [place], [address] Phone: [phone]   Fax:  [phone]  Physical Therapy Treatment  Patient Details  Name: Micheal Frank MRN: [id number] Date of Birth: 19-Dec-1925 Referring Provider: Kathlene November  Encounter Date: 04/03/2016      PT End of Session - 04/03/16 1606    Visit Number 3   Date for PT Re-Evaluation 05/21/16   PT Start Time 1517   PT Stop Time 1606   PT Time Calculation (min) 49 min   Activity Tolerance Patient tolerated treatment well   Behavior During Therapy [facility] for tasks assessed/performed      Past Medical History:  Diagnosis Date  . Arthritis    left leg also has swelling  . Asthma   . Blood dyscrasia    pt CAN NOT have any blood thinners d/t hx brain bleed  . Chronic kidney disease, stage III (moderate) 01/20/2013  . Complication of anesthesia    confusion with anesthesia  . Constipation    miralax prn  . COPD (chronic obstructive pulmonary disease) (HCC)    emphysema  . Depression    takes wellbutrin bid  . Epidural abscess   . Gastric ulcer   . History of blood clots   . Hyperlipidemia   . Melanoma ([place])   . Peripheral neuropathy ([place])   . Pneumonia    as a child and in 1952;had a pneumonia vaccine couple of years ago  . Prosthetic joint infection ([place])   . S/P right heart catheterization    at least 28yrs  . Shingles   . Short-term memory loss   . Shortness of breath    with exertion  . Sleep apnea    has CPAP but doesn't use it;sleep study done at least 63yrs ago  . Staphylococcus aureus bacteremia with sepsis ([place])   . Subdural hematoma ([place]) 03/30/11  . Urinary frequency    wears depends    Past Surgical History:  Procedure Laterality Date  . epidural abscess drainage  12/2010  . 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 EYE SURGERY     bil cataract surgery  . FILTERING PROCEDURE     filter placed in neck  d/t blood  clot  . HERNIA REPAIR     25+yrs ago  . KNEE ARTHROSCOPY    . SKIN BIOPSY     melanoma on head   . TOTAL KNEE ARTHROPLASTY     x 2 left knee 2003/2012  . TOTAL KNEE REVISION  05/23/2011   Procedure: TOTAL KNEE REVISION;  Surgeon: Meredith Pel;  Location: [facility];  Service: Orthopedics;  Laterality: Left;  LEFT KNEE REMOVAL OF SPACER, POSSIBLE REIMPLANTATION OF REVISION TKA VERSES REPLACEMENT ANTIBIOTIC SPACER    There were no vitals filed for this visit.      Subjective Assessment - 04/03/16 1518    Subjective Pre treatment Sp02 sat 91%, "Just a little slow"   Currently in Pain? No/denies                         [facility] Adult PT Treatment/Exercise - 04/03/16 0001      Ambulation/Gait   Ambulation/Gait Yes   Ambulation Distance (Feet) 40 Feet   Assistive device Small based quad cane   Gait Pattern Step-through pattern;Step-to pattern;Decreased stride length;Decreased hip/knee flexion - right   Ambulation Surface Indoor;Level  Gait Comments Pt given skilled demonstration of proper use. os small base quad cane. Gait trial x4 min assist     High Level Balance   High Level Balance Activities Side stepping   High Level Balance Comments ball tossing, solid surface and then dynamic surface     Knee/Hip Exercises: Aerobic   Nustep Level 3 x 4 minutes     Knee/Hip Exercises: Machines for Strengthening   Cybex Knee Extension 5# 2x10   Cybex Knee Flexion 20# 2x10   Cybex Leg Press 20# 2x10     Knee/Hip Exercises: Seated   Marching Limitations 2x10   Marching Weights 3 lbs.   Sit to Sand 5 reps;2 sets;with UE support                  PT Short Term Goals - 03/21/16 1331      PT SHORT TERM GOAL #1   Title Independent with initial HEP   Time 2   Period Weeks   Status New           PT Long Term Goals - 04/03/16 1612      PT LONG TERM GOAL #1   Title decrease TUG time to 20 seconds   Status On-going     PT LONG TERM GOAL #2    Title increase Berg balance test score to 46/56   Status On-going     PT LONG TERM GOAL #3   Title no falls over a 4 week period   Status On-going     PT LONG TERM GOAL #4   Title get in and out of car safely and without difficulty   Status On-going               Plan - 04/03/16 1607    Clinical Impression Statement Frequent rest breaks needed to regain proper O2 saturation. Pt SpO2 wound drop 88-91% with activity and required time to recover. Pt demos good strength with sit to stand and with machine level interventions. Pt with some instability with ball toss on non compliant surfaces.   PT Frequency 2x / week   PT Duration 8 weeks   PT Treatment/Interventions ADLs/Self Care Home Management;Gait training;Stair training;Functional mobility training;Therapeutic activities;Therapeutic exercise;Balance training;Patient/family education;Manual techniques   PT Next Visit Plan slowly add exercises and balance activities      Patient will benefit from skilled therapeutic intervention in order to improve the following deficits and impairments:  Abnormal gait, Cardiopulmonary status limiting activity, Decreased balance, Decreased mobility, Decreased endurance, Decreased safety awareness, Decreased strength, Difficulty walking  Visit Diagnosis: Repeated falls  Difficulty in walking, not elsewhere classified  Muscle weakness (generalized)     Problem List Patient Active Problem List   Diagnosis Date Noted  . Hypotension 02/20/2016  . Sepsis secondary to UTI ([place]) 02/20/2016  . Sepsis ([place]) 02/20/2016  . Urinary retention 02/20/2016  . Chronic cough 08/16/2015  . PCP NOTES >>>>>> 04/06/2015  . CTS (carpal tunnel syndrome) 11/16/2014  . Hyperlipidemia 08/17/2014  . Sleep apnea 08/17/2014  . Superficial bruising of chest wall 07/29/2014  . Urgency incontinence 01/23/2013  . Chronic kidney disease, stage III (moderate) 01/20/2013  . At high risk for falls 11/28/2012  .  ALLERGIC RHINITIS 12/09/2009  . CARCINOMA IN SITU, SKIN 04/30/2008  . RESTLESS LEG SYNDROME 04/30/2008  . NEUROPATHY 04/30/2008  . GLAUCOMA, BORDERLINE 04/30/2008  . Meniere's disease 04/30/2008  . HEARING LOSS 04/30/2008  . COPD GOLD II  04/30/2008  . DJD (degenerative joint disease)  04/30/2008  . Osteoporosis 04/30/2008  . SLEEP APNEA 04/30/2008  . HISTORY OF ASBESTOS EXPOSURE 04/30/2008    Scot Jun 04/03/2016, 4:13 PM  [place] [address] [place], [place], [address] Phone: [phone]   Fax:  [phone]  Name: Micheal Frank MRN: [id number] Date of Birth: 1925/12/10

## 2016-04-04 ENCOUNTER — Other Ambulatory Visit: Payer: Self-pay | Admitting: Internal Medicine

## 2016-04-06 ENCOUNTER — Ambulatory Visit: Payer: Medicare Other | Admitting: Physical Therapy

## 2016-04-06 ENCOUNTER — Encounter: Payer: Self-pay | Admitting: Physical Therapy

## 2016-04-06 DIAGNOSIS — M6281 Muscle weakness (generalized): Secondary | ICD-10-CM

## 2016-04-06 DIAGNOSIS — R262 Difficulty in walking, not elsewhere classified: Secondary | ICD-10-CM

## 2016-04-06 DIAGNOSIS — R296 Repeated falls: Secondary | ICD-10-CM

## 2016-04-06 NOTE — Therapy (Signed)
[address] [place], [place], [address] Phone: [phone]   Fax:  [phone]  Physical Therapy Treatment  Patient Details  Name: Micheal Frank MRN: [id number] Date of Birth: 1925/09/08 Referring Provider: Kathlene November  Encounter Date: 04/06/2016      PT End of Session - 04/06/16 1559    Visit Number 4   Date for PT Re-Evaluation 05/21/16   PT Start Time [id number]   PT Stop Time 1610   PT Time Calculation (min) 47 min   Activity Tolerance Patient tolerated treatment well   Behavior During Therapy [facility] for tasks assessed/performed      Past Medical History:  Diagnosis Date  . Arthritis    left leg also has swelling  . Asthma   . Blood dyscrasia    pt CAN NOT have any blood thinners d/t hx brain bleed  . Chronic kidney disease, stage III (moderate) 01/20/2013  . Complication of anesthesia    confusion with anesthesia  . Constipation    miralax prn  . COPD (chronic obstructive pulmonary disease) (HCC)    emphysema  . Depression    takes wellbutrin bid  . Epidural abscess   . Gastric ulcer   . History of blood clots   . Hyperlipidemia   . Melanoma ([place])   . Peripheral neuropathy ([place])   . Pneumonia    as a child and in 1952;had a pneumonia vaccine couple of years ago  . Prosthetic joint infection ([place])   . S/P right heart catheterization    at least 70yrs  . Shingles   . Short-term memory loss   . Shortness of breath    with exertion  . Sleep apnea    has CPAP but doesn't use it;sleep study done at least 7yrs ago  . Staphylococcus aureus bacteremia with sepsis ([place])   . Subdural hematoma ([place]) 03/30/11  . Urinary frequency    wears depends    Past Surgical History:  Procedure Laterality Date  . epidural abscess drainage  12/2010  . 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 EYE SURGERY     bil cataract surgery  . FILTERING PROCEDURE     filter placed in neck  d/t blood  clot  . HERNIA REPAIR     25+yrs ago  . KNEE ARTHROSCOPY    . SKIN BIOPSY     melanoma on head   . TOTAL KNEE ARTHROPLASTY     x 2 left knee 2003/2012  . TOTAL KNEE REVISION  05/23/2011   Procedure: TOTAL KNEE REVISION;  Surgeon: Meredith Pel;  Location: [place];  Service: Orthopedics;  Laterality: Left;  LEFT KNEE REMOVAL OF SPACER, POSSIBLE REIMPLANTATION OF REVISION TKA VERSES REPLACEMENT ANTIBIOTIC SPACER    There were no vitals filed for this visit.      Subjective Assessment - 04/06/16 1529    Subjective Patient reports that he has been practicing breathing and he feels better when he does it, just does not do it al the time   Currently in Pain? No/denies                         [facility] Adult PT Treatment/Exercise - 04/06/16 0001      High Level Balance   High Level Balance Activities Side stepping;Backward walking;Tandem walking   High Level Balance Comments ball tossing, solid surface and  then dynamic surface, toe walking, heel walking, obstacle course     Knee/Hip Exercises: Aerobic   Nustep level 5 x 6 minutes, had to stop at 4 minutes to rest and catch breath     Knee/Hip Exercises: Machines for Strengthening   Cybex Knee Extension 5# 2x15   Cybex Knee Flexion 20# 2x15                  PT Short Term Goals - 03/21/16 1331      PT SHORT TERM GOAL #1   Title Independent with initial HEP   Time 2   Period Weeks   Status New           PT Long Term Goals - 04/03/16 1612      PT LONG TERM GOAL #1   Title decrease TUG time to 20 seconds   Status On-going     PT LONG TERM GOAL #2   Title increase Berg balance test score to 46/56   Status On-going     PT LONG TERM GOAL #3   Title no falls over a 4 week period   Status On-going     PT LONG TERM GOAL #4   Title get in and out of car safely and without difficulty   Status On-going               Plan - 04/06/16 1603    Clinical Impression Statement Less  breaks needed today, able to tolerate all activities, he had a very hard time with balance on tandem gait and any time he was on a dynamic surface   PT Next Visit Plan continue with balance activities   Consulted and Agree with Plan of Care Patient      Patient will benefit from skilled therapeutic intervention in order to improve the following deficits and impairments:  Abnormal gait, Cardiopulmonary status limiting activity, Decreased balance, Decreased mobility, Decreased endurance, Decreased safety awareness, Decreased strength, Difficulty walking  Visit Diagnosis: Repeated falls  Difficulty in walking, not elsewhere classified  Muscle weakness (generalized)     Problem List Patient Active Problem List   Diagnosis Date Noted  . Hypotension 02/20/2016  . Sepsis secondary to UTI ([place]) 02/20/2016  . Sepsis ([place]) 02/20/2016  . Urinary retention 02/20/2016  . Chronic cough 08/16/2015  . PCP NOTES >>>>>> 04/06/2015  . CTS (carpal tunnel syndrome) 11/16/2014  . Hyperlipidemia 08/17/2014  . Sleep apnea 08/17/2014  . Superficial bruising of chest wall 07/29/2014  . Urgency incontinence 01/23/2013  . Chronic kidney disease, stage III (moderate) 01/20/2013  . At high risk for falls 11/28/2012  . ALLERGIC RHINITIS 12/09/2009  . CARCINOMA IN SITU, SKIN 04/30/2008  . RESTLESS LEG SYNDROME 04/30/2008  . NEUROPATHY 04/30/2008  . GLAUCOMA, BORDERLINE 04/30/2008  . Meniere's disease 04/30/2008  . HEARING LOSS 04/30/2008  . COPD GOLD II  04/30/2008  . DJD (degenerative joint disease) 04/30/2008  . Osteoporosis 04/30/2008  . SLEEP APNEA 04/30/2008  . HISTORY OF ASBESTOS EXPOSURE 04/30/2008    Sumner Boast., PT 04/06/2016, 4:05 PM  [address] [place], [place], [address] Phone: [phone]   Fax:  [phone]  Name: Micheal Frank MRN: [id number] Date of Birth: 25-Sep-1925

## 2016-04-07 ENCOUNTER — Ambulatory Visit: Payer: Medicare Other | Admitting: Internal Medicine

## 2016-04-11 ENCOUNTER — Encounter: Payer: Self-pay | Admitting: Internal Medicine

## 2016-04-11 ENCOUNTER — Ambulatory Visit (INDEPENDENT_AMBULATORY_CARE_PROVIDER_SITE_OTHER): Payer: Medicare Other | Admitting: Internal Medicine

## 2016-04-11 ENCOUNTER — Ambulatory Visit: Payer: Medicare Other | Admitting: Physical Therapy

## 2016-04-11 VITALS — BP 130/74 | HR 64 | Temp 97.8°F | Resp 14 | Ht 70.0 in | Wt 160.5 lb

## 2016-04-11 DIAGNOSIS — R195 Other fecal abnormalities: Secondary | ICD-10-CM

## 2016-04-11 DIAGNOSIS — Z23 Encounter for immunization: Secondary | ICD-10-CM | POA: Diagnosis not present

## 2016-04-11 DIAGNOSIS — R339 Retention of urine, unspecified: Secondary | ICD-10-CM | POA: Diagnosis not present

## 2016-04-11 DIAGNOSIS — R197 Diarrhea, unspecified: Secondary | ICD-10-CM | POA: Diagnosis not present

## 2016-04-11 NOTE — Progress Notes (Signed)
Pre visit review using our clinic review tool, if applicable. No additional management support is needed unless otherwise documented below in the visit note. 

## 2016-04-11 NOTE — Progress Notes (Signed)
Subjective:    Patient ID: Micheal Frank, male    DOB: 17-Jun-1926, 82 y.o.   MRN: [id number]  DOS:  04/11/2016 Type of visit - description : acute, here w/ daughter  Interval history: Several weeks history of diarrhea described as soft stools, 2 to 3 bowel movements a day, occasionally associated with liquid  stools.    Review of Systems  Denies fever, chills. No weight loss No blood in the stools or stomach pain. Had no further problems with urinary retention.  Past Medical History:  Diagnosis Date  . Arthritis    left leg also has swelling  . Asthma   . Blood dyscrasia    pt CAN NOT have any blood thinners d/t hx brain bleed  . Chronic kidney disease, stage III (moderate) 01/20/2013  . Complication of anesthesia    confusion with anesthesia  . Constipation    miralax prn  . COPD (chronic obstructive pulmonary disease) (HCC)    emphysema  . Depression    takes wellbutrin bid  . Epidural abscess   . Gastric ulcer   . History of blood clots   . Hyperlipidemia   . Melanoma ([place])   . Peripheral neuropathy ([place])   . Pneumonia    as a child and in 1952;had a pneumonia vaccine couple of years ago  . Prosthetic joint infection ([place])   . S/P right heart catheterization    at least 53yrs  . Shingles   . Short-term memory loss   . Shortness of breath    with exertion  . Sleep apnea    has CPAP but doesn't use it;sleep study done at least 22yrs ago  . Staphylococcus aureus bacteremia with sepsis ([place])   . Subdural hematoma ([place]) 03/30/11  . Urinary frequency    wears depends    Past Surgical History:  Procedure Laterality Date  . epidural abscess drainage  12/2010  . 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 EYE SURGERY     bil cataract surgery  . FILTERING PROCEDURE     filter placed in neck d/t blood  clot  . HERNIA REPAIR     25+yrs ago  . KNEE ARTHROSCOPY    . SKIN BIOPSY     melanoma on head   . TOTAL KNEE ARTHROPLASTY     x 2  left knee 2003/2012  . TOTAL KNEE REVISION  05/23/2011   Procedure: TOTAL KNEE REVISION;  Surgeon: Meredith Pel;  Location: [place];  Service: Orthopedics;  Laterality: Left;  LEFT KNEE REMOVAL OF SPACER, POSSIBLE REIMPLANTATION OF REVISION TKA VERSES REPLACEMENT ANTIBIOTIC SPACER    Social History   Social History  . Marital status: Married    Spouse name: N/A  . Number of children: 3  . Years of education: N/A   Occupational History  . retired    Social History Main Topics  . Smoking status: Former Smoker    Packs/day: 2.00    Years: 40.00    Types: Cigarettes    Quit date: 07/17/1978  . Smokeless tobacco: Never Used  . Alcohol use No  . Drug use: No  . Sexual activity: Not on file   Other Topics Concern  . Not on file   Social History Narrative   Lost wife 06-2013   Lives in his house, 1 daughter and her 2 adult children live w/ him (1g-child w/ special needs)  Medication List       Accurate as of 04/11/16  2:10 PM. Always use your most recent med list.          acetaminophen 325 MG tablet Commonly known as:  TYLENOL Take 2 tablets (650 mg total) by mouth every 6 (six) hours as needed for mild pain (or Fever >/= 101).   albuterol 108 (90 Base) MCG/ACT inhaler Commonly known as:  PROAIR HFA Inhale 2 puffs into the lungs every 6 (six) hours as needed for wheezing.   AZO CRANBERRY URINARY TRACT 250-60 MG Caps Generic drug:  Cranberry-Vitamin C Take 1 tablet by mouth daily.   benzonatate 200 MG capsule Commonly known as:  TESSALON TAKE ONE CAPSULE 3-4 TIMES DAILY AS NEEDED FOR COUGH   budesonide-formoterol 160-4.5 MCG/ACT inhaler Commonly known as:  SYMBICORT Inhale 2 puffs into the lungs 2 (two) times daily.   buPROPion 200 MG 12 hr tablet Commonly known as:  WELLBUTRIN SR Take 1 tablet (200 mg total) by mouth 2 (two) times daily.   calcium carbonate 600 MG Tabs tablet Commonly known as:  OS-CAL Take 600 mg by mouth daily.   diazepam  5 MG tablet Commonly known as:  VALIUM Take 1 tablet (5 mg total) by mouth every 6 (six) hours as needed for anxiety.   doxazosin 4 MG tablet Commonly known as:  CARDURA Take 1 tablet (4 mg total) by mouth at bedtime.   fluticasone 50 MCG/ACT nasal spray Commonly known as:  FLONASE Place 2 sprays into the nose 2 (two) times daily.   hydrocortisone 2.5 % cream Apply topically 2 (two) times daily.   meclizine 25 MG tablet Commonly known as:  ANTIVERT Take 1 tablet (25 mg total) by mouth daily as needed (vertigo).   montelukast 10 MG tablet Commonly known as:  SINGULAIR Take 1 tablet (10 mg total) by mouth daily. Reported on 08/04/2015   multivitamin with minerals Tabs tablet Take 1 tablet by mouth daily.   rOPINIRole 1 MG tablet Commonly known as:  REQUIP Take 1 tablet (1 mg total) by mouth at bedtime.   saccharomyces boulardii 250 MG capsule Commonly known as:  FLORASTOR Take 1 capsule (250 mg total) by mouth 2 (two) times daily.   simvastatin 40 MG tablet Commonly known as:  ZOCOR Take 1 tablet (40 mg total) by mouth at bedtime.   tamsulosin 0.4 MG Caps capsule Commonly known as:  FLOMAX Take 1 capsule (0.4 mg total) by mouth daily.   triamterene-hydrochlorothiazide 37.5-25 MG tablet Commonly known as:  MAXZIDE-25 1/2 TABLET DAILY   URELLE 81 MG Tabs tablet Take 1 tablet (81 mg total) by mouth every 6 (six) hours as needed for bladder spasms.          Objective:   Physical Exam BP 130/74 (BP Location: Left Arm, Patient Position: Sitting, Cuff Size: Normal)   Pulse 64   Temp 97.8 F (36.6 C) (Oral)   Resp 14   Ht 5\' 10"  (1.778 m)   Wt 160 lb 8 oz (72.8 kg)   SpO2 96%   BMI 23.03 kg/m  General:   Well developed, well nourished . NAD.  HEENT:  Normocephalic . Face symmetric, atraumatic Lungs:  CTA B Normal respiratory effort, no intercostal retractions, no accessory muscle use. Heart: Soft systolic murmur.  no pretibial edema bilaterally  Abdomen:   Not distended, soft, non-tender. No rebound or rigidity.  Skin: Not pale. Not jaundice Neurologic:  alert & oriented X3.  Speech normal, gait limited  and assisted by a cane Psych--  Behavior appropriate. No anxious or depressed appearing.    Assessment & Plan:    Assessment > COPD- Dr Melvyn Novas 08-2015, stable, RTC PRN CKD -----Creatinine 1.67, GFR 32 Hyperlipidemia Depression Decreased memory Neuropathy H/o Mnire's  -- on diuretics , valium, meclizine Osteoporosis -- h/o pelvic Fx, dexa April 2016: T score (-) 2.3; prolia-- not covered, rx fosamax 03-2015, dc after 3 months d/t dysphagia DJD  -- hydrocodone rarely RLS OSA no CPAP GU: --H/o incontinence, saw Dr Karsten Ro, OV ~ 2013 --Urinary retention 02-2016: sepsis, admitted, had a foley temporarily  H/o melanoma H/o subdural hematoma, epidural abscess, staph aureus  sepsis H/o Prosthetic joint infection H/o Blood clots H/o gastric ulcer C diff dx 10-19-15, s/p flagyl x 2  Coordinate care with his daughter [name]  [phone]  PLAN: Diarrhea: As described above, history of C. difficile few months ago, will get stool studies for C. difficile, WBC and cultures. Start treatment ASAP w/ results. Flagyl ? vancomycin?. Urinary retention: No further issues Osteoporosis: His insurance ok prolia, , we discussed potential benefits of  treatment; he agreed to start. Will call us when ready. Mnire's: Taking Valium rarely and has Maxzide daily. Flu shot today RTC October as already scheduled

## 2016-04-11 NOTE — Patient Instructions (Addendum)
GO TO THE LAB :  get containers for stool studies  C diff, WBCs, culture    Will call with results ASAP  Take a OTC probiotic (FLORASTOR) daily  Call if fever, blood in stools, nausea or abdominal pain  Next visit by 05-2016

## 2016-04-12 NOTE — Assessment & Plan Note (Signed)
Diarrhea: As described above, history of C. difficile few months ago, will get stool studies for C. difficile, WBC and cultures. Start treatment ASAP w/ results. Flagyl ? vancomycin?. Urinary retention: No further issues Osteoporosis: His insurance ok prolia, , we discussed potential benefits of  treatment; he agreed to start. Will call us when ready. Mnire's: Taking Valium rarely and has Maxzide daily. Flu shot today RTC October as already scheduled

## 2016-04-13 ENCOUNTER — Ambulatory Visit: Payer: Medicare Other | Admitting: Physical Therapy

## 2016-04-13 DIAGNOSIS — R296 Repeated falls: Secondary | ICD-10-CM

## 2016-04-13 DIAGNOSIS — R262 Difficulty in walking, not elsewhere classified: Secondary | ICD-10-CM

## 2016-04-13 DIAGNOSIS — M6281 Muscle weakness (generalized): Secondary | ICD-10-CM

## 2016-04-13 NOTE — Therapy (Signed)
[address] [place], [place], [address] Phone: [phone]   Fax:  [phone]  Physical Therapy Treatment  Patient Details  Name: Micheal Frank MRN: [id number] Date of Birth: 1926/07/15 Referring Provider: Kathlene November  Encounter Date: 04/13/2016      PT End of Session - 04/13/16 1512    Visit Number 5   Date for PT Re-Evaluation 05/21/16   PT Start Time 1430   PT Stop Time 1513   PT Time Calculation (min) 43 min      Past Medical History:  Diagnosis Date  . Arthritis    left leg also has swelling  . Asthma   . Blood dyscrasia    pt CAN NOT have any blood thinners d/t hx brain bleed  . Chronic kidney disease, stage III (moderate) 01/20/2013  . Complication of anesthesia    confusion with anesthesia  . Constipation    miralax prn  . COPD (chronic obstructive pulmonary disease) (HCC)    emphysema  . Depression    takes wellbutrin bid  . Epidural abscess   . Gastric ulcer   . History of blood clots   . Hyperlipidemia   . Melanoma ([place])   . Peripheral neuropathy ([place])   . Pneumonia    as a child and in 1952;had a pneumonia vaccine couple of years ago  . Prosthetic joint infection ([place])   . S/P right heart catheterization    at least 65yrs  . Shingles   . Short-term memory loss   . Shortness of breath    with exertion  . Sleep apnea    has CPAP but doesn't use it;sleep study done at least 8yrs ago  . Staphylococcus aureus bacteremia with sepsis ([place])   . Subdural hematoma ([place]) 03/30/11  . Urinary frequency    wears depends    Past Surgical History:  Procedure Laterality Date  . epidural abscess drainage  12/2010  . 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 EYE SURGERY     bil cataract surgery  . FILTERING PROCEDURE     filter placed in neck d/t blood  clot  . HERNIA REPAIR     25+yrs ago  . KNEE ARTHROSCOPY    . SKIN BIOPSY     melanoma on head   .  TOTAL KNEE ARTHROPLASTY     x 2 left knee 2003/2012  . TOTAL KNEE REVISION  05/23/2011   Procedure: TOTAL KNEE REVISION;  Surgeon: Meredith Pel;  Location: [place];  Service: Orthopedics;  Laterality: Left;  LEFT KNEE REMOVAL OF SPACER, POSSIBLE REIMPLANTATION OF REVISION TKA VERSES REPLACEMENT ANTIBIOTIC SPACER    There were no vitals filed for this visit.      Subjective Assessment - 04/13/16 1429    Subjective "Im doing fine" Pt reports no falls   Currently in Pain? No/denies   Multiple Pain Sites No                         [facility] Adult PT Treatment/Exercise - 04/13/16 0001      High Level Balance   High Level Balance Activities Side stepping;Backward walking;Tandem walking   High Level Balance Comments ball tossing, solid surface then dynamic surface with RLE only; Toe and heel walking     Knee/Hip Exercises: Aerobic   Nustep level 5 x 6 minutes, had to stop at  4 minutes to rest and catch breath     Knee/Hip Exercises: Machines for Strengthening   Cybex Knee Extension 5# 2x15   Cybex Knee Flexion 20# 3x10     Knee/Hip Exercises: Seated   Sit to Sand 5 reps;2 sets;with UE support                  PT Short Term Goals - 03/21/16 1331      PT SHORT TERM GOAL #1   Title Independent with initial HEP   Time 2   Period Weeks   Status New           PT Long Term Goals - 04/03/16 1612      PT LONG TERM GOAL #1   Title decrease TUG time to 20 seconds   Status On-going     PT LONG TERM GOAL #2   Title increase Berg balance test score to 46/56   Status On-going     PT LONG TERM GOAL #3   Title no falls over a 4 week period   Status On-going     PT LONG TERM GOAL #4   Title get in and out of car safely and without difficulty   Status On-going               Plan - 04/13/16 1513    Clinical Impression Statement No breaks required to complete 6 minutes on NuStep. Pt with increase difficulty standing on airex pad. Pt tends to  put most of his weight on heels when standing airex and unable to correct with verbal or tactile cues.  Difficulty with tandem walking multiple bouts of LOB   Rehab Potential Good   PT Frequency 2x / week   PT Treatment/Interventions ADLs/Self Care Home Management;Gait training;Stair training;Functional mobility training;Therapeutic activities;Therapeutic exercise;Balance training;Patient/family education;Manual techniques   PT Next Visit Plan continue with balance activities      Patient will benefit from skilled therapeutic intervention in order to improve the following deficits and impairments:  Abnormal gait, Cardiopulmonary status limiting activity, Decreased balance, Decreased mobility, Decreased endurance, Decreased safety awareness, Decreased strength, Difficulty walking  Visit Diagnosis: Repeated falls  Difficulty in walking, not elsewhere classified  Muscle weakness (generalized)     Problem List Patient Active Problem List   Diagnosis Date Noted  . Hypotension 02/20/2016  . Sepsis secondary to UTI ([place]) 02/20/2016  . Sepsis ([place]) 02/20/2016  . Urinary retention 02/20/2016  . Chronic cough 08/16/2015  . PCP NOTES >>>>>> 04/06/2015  . CTS (carpal tunnel syndrome) 11/16/2014  . Hyperlipidemia 08/17/2014  . Sleep apnea 08/17/2014  . Superficial bruising of chest wall 07/29/2014  . Urgency incontinence 01/23/2013  . Chronic kidney disease, stage III (moderate) 01/20/2013  . At high risk for falls 11/28/2012  . ALLERGIC RHINITIS 12/09/2009  . CARCINOMA IN SITU, SKIN 04/30/2008  . RESTLESS LEG SYNDROME 04/30/2008  . NEUROPATHY 04/30/2008  . GLAUCOMA, BORDERLINE 04/30/2008  . Meniere's disease 04/30/2008  . HEARING LOSS 04/30/2008  . COPD GOLD II  04/30/2008  . DJD (degenerative joint disease) 04/30/2008  . Osteoporosis 04/30/2008  . SLEEP APNEA 04/30/2008  . HISTORY OF ASBESTOS EXPOSURE 04/30/2008    Scot Jun 04/13/2016, 3:22 PM  [address] [place], [place], [address] Phone: [phone]   Fax:  [phone]  Name: Micheal Frank MRN: [id number] Date of Birth: 1925/11/20

## 2016-04-14 ENCOUNTER — Other Ambulatory Visit: Payer: Self-pay | Admitting: Internal Medicine

## 2016-04-14 NOTE — Addendum Note (Signed)
Addended by: Peggyann Shoals on: 04/14/2016 09:25 AM   Modules accepted: Orders

## 2016-04-15 LAB — C. DIFFICILE GDH AND TOXIN A/B
C. difficile GDH: DETECTED — AB
C. difficile Toxin A/B: NOT DETECTED

## 2016-04-15 LAB — CLOSTRIDIUM DIFFICILE BY PCR: CDIFFPCR: DETECTED — AB

## 2016-04-17 ENCOUNTER — Telehealth: Payer: Self-pay | Admitting: Internal Medicine

## 2016-04-17 ENCOUNTER — Ambulatory Visit: Payer: Medicare Other | Attending: Internal Medicine | Admitting: Physical Therapy

## 2016-04-17 ENCOUNTER — Encounter: Payer: Self-pay | Admitting: Physical Therapy

## 2016-04-17 DIAGNOSIS — R296 Repeated falls: Secondary | ICD-10-CM | POA: Insufficient documentation

## 2016-04-17 DIAGNOSIS — R262 Difficulty in walking, not elsewhere classified: Secondary | ICD-10-CM | POA: Diagnosis present

## 2016-04-17 DIAGNOSIS — M6281 Muscle weakness (generalized): Secondary | ICD-10-CM | POA: Insufficient documentation

## 2016-04-17 LAB — FECAL LACTOFERRIN, QUANT: LACTOFERRIN: NEGATIVE

## 2016-04-17 MED ORDER — VANCOMYCIN HCL 125 MG PO CAPS: 125.0000 mg | ORAL_CAPSULE | Freq: Four times a day (QID) | ORAL | 0 refills | Status: DC

## 2016-04-17 NOTE — Telephone Encounter (Signed)
Relation to [id number] Call back [place]: [id number], [place] [address] [id number] (Phone) [phone] (Fax)     Reason for call:  Daughter states vancomycin (VANCOCIN) 125 MG capsule is expenisive over a $100 and patient refuses to pay requesting another medication. Please advise daughter directly

## 2016-04-17 NOTE — Telephone Encounter (Signed)
Please advise 

## 2016-04-17 NOTE — Telephone Encounter (Signed)
Spoke with [name], discusse the situation, d she agreed to start the medication and bring him back in 2 weeks

## 2016-04-17 NOTE — Addendum Note (Signed)
Addended byDamita Dunnings D on: 04/17/2016 09:25 AM   Modules accepted: Orders

## 2016-04-17 NOTE — Therapy (Addendum)
[name], [place], [address] Phone: [phone]   Fax:  [phone]  Physical Therapy Treatment  Patient Details  Name: Micheal Frank MRN: [id number] Date of Birth: 03-13-1926 Referring Provider: Kathlene November  Encounter Date: 04/17/2016      PT End of Session - 04/17/16 1608    Visit Number 6   Date for PT Re-Evaluation 05/21/16   PT Start Time 1522   PT Stop Time 1608   PT Time Calculation (min) 46 min   Activity Tolerance Patient tolerated treatment well   Behavior During Therapy [facility] for tasks assessed/performed      Past Medical History:  Diagnosis Date  . Arthritis    left leg also has swelling  . Asthma   . Blood dyscrasia    pt CAN NOT have any blood thinners d/t hx brain bleed  . Chronic kidney disease, stage III (moderate) 01/20/2013  . Complication of anesthesia    confusion with anesthesia  . Constipation    miralax prn  . COPD (chronic obstructive pulmonary disease) (HCC)    emphysema  . Depression    takes wellbutrin bid  . Epidural abscess   . Gastric ulcer   . History of blood clots   . Hyperlipidemia   . Melanoma ([place])   . Peripheral neuropathy ([place])   . Pneumonia    as a child and in 1952;had a pneumonia vaccine couple of years ago  . Prosthetic joint infection ([place])   . S/P right heart catheterization    at least 108yr  . Shingles   . Short-term memory loss   . Shortness of breath    with exertion  . Sleep apnea    has CPAP but doesn't use it;sleep study done at least 89yrago  . Staphylococcus aureus bacteremia with sepsis ([facility]  . Subdural hematoma ([place]  . Urinary frequency    wears depends    Past Surgical History:  Procedure Laterality Date  . epidural abscess drainage  12/2010  . 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 EYE SURGERY     bil cataract surgery  . FILTERING PROCEDURE     filter placed in neck  d/t blood  clot  . HERNIA REPAIR     25+yrs ago  . KNEE ARTHROSCOPY    . SKIN BIOPSY     melanoma on head   . TOTAL KNEE ARTHROPLASTY     x 2 left knee 2003/2012  . TOTAL KNEE REVISION  05/23/2011   Procedure: TOTAL KNEE REVISION;  Surgeon: GrMeredith Pel [facility] Service: Orthopedics;  Laterality: Left;  LEFT KNEE REMOVAL OF SPACER, POSSIBLE REIMPLANTATION OF REVISION TKA VERSES REPLACEMENT ANTIBIOTIC SPACER    There were no vitals filed for this visit.      Subjective Assessment - 04/17/16 1532    Subjective No falls, reports that he had some knee pain after the last treatemnt and feels that he overdid it during the last treatment                         [facility] - 04/17/16 0001      Ambulation/Gait   Gait Comments walk with patient outside [facility] at time with education on curbs and uneven surfaces, needed multiple attempts and education on safety with curbs  High Level Balance   High Level Balance Activities Side stepping;Backward walking;Tandem walking   High Level Balance Comments ball tossing, solid surface then dynamic surface with RLE only; Toe and heel walking     Knee/Hip Exercises: Aerobic   Nustep level 4 x 4 minutes changed this to decrease stress on the knee                PT Education - 04/17/16 1608    Education provided Yes   Education Details balance activities went over with his daughter and him   Person(s) Educated Patient;Child(ren)   Methods Explanation;Demonstration;Handout   Comprehension Verbalized understanding          PT Short Term Goals - 03/21/16 1331      PT SHORT TERM GOAL #1   Title Independent with initial HEP   Time 2   Period Weeks   Status met           PT Long Term Goals - 04/17/16 1611      PT LONG TERM GOAL #1   Title decrease TUG time to 20 seconds   Status Partially Met     PT LONG TERM GOAL #2   Title increase Berg balance test  score to 46/56   Status Not met      PT LONG TERM GOAL #4   Title get in and out of car safely and without difficulty   Status Partially Met               Plan - 04/17/16 1609    Clinical Impression Statement Pateints daughter reports he was very frustrated after last visit, "he felt like he did poorly" on the balance activities, he needed encouragement to let him know that balance can be trained.  He did needed a lot of cues for walking outside on uneven surfaces and especially curbs   PT Next Visit Plan work on balance, may back off the knee strengthening exercises as he reported knee pain after the last visit   Consulted and Agree with Plan of Care Patient      Patient will benefit from skilled therapeutic intervention in order to improve the following deficits and impairments:  Abnormal gait, Cardiopulmonary status limiting activity, Decreased balance, Decreased mobility, Decreased endurance, Decreased safety awareness, Decreased strength, Difficulty walking  Visit Diagnosis: Repeated falls  Difficulty in walking, not elsewhere classified  Muscle weakness (generalized)     Problem List Patient Active Problem List   Diagnosis Date Noted  . Hypotension 02/20/2016  . Sepsis secondary to UTI ([place]) 02/20/2016  . Sepsis ([place]) 02/20/2016  . Urinary retention 02/20/2016  . Chronic cough 08/16/2015  . PCP NOTES >>>>>> 04/06/2015  . CTS (carpal tunnel syndrome) 11/16/2014  . Hyperlipidemia 08/17/2014  . Sleep apnea 08/17/2014  . Superficial bruising of chest wall 07/29/2014  . Urgency incontinence 01/23/2013  . Chronic kidney disease, stage III (moderate) 01/20/2013  . At high risk for falls 11/28/2012  . ALLERGIC RHINITIS 12/09/2009  . CARCINOMA IN SITU, SKIN 04/30/2008  . RESTLESS LEG SYNDROME 04/30/2008  . NEUROPATHY 04/30/2008  . GLAUCOMA, BORDERLINE 04/30/2008  . Meniere's disease 04/30/2008  . HEARING LOSS 04/30/2008  . COPD GOLD II  04/30/2008  . DJD  (degenerative joint disease) 04/30/2008  . Osteoporosis 04/30/2008  . SLEEP APNEA 04/30/2008  . HISTORY OF ASBESTOS EXPOSURE 04/30/2008   PHYSICAL THERAPY DISCHARGE SUMMARY   Plan: Patient agrees to discharge.  Patient goals were partially met. Patient is being discharged due to  the patient's request.  ?????        Sumner Boast., PT 04/17/2016, 4:12 PM  [address] [place], [place], [address] Phone: [phone]   Fax:  [phone]  Name: MARKCUS LAZENBY MRN: [id number] Date of Birth: [birth year]

## 2016-04-19 LAB — STOOL CULTURE

## 2016-04-20 ENCOUNTER — Ambulatory Visit: Payer: Medicare Other | Admitting: Physical Therapy

## 2016-04-20 NOTE — Telephone Encounter (Signed)
04/20/16 daughter called and said patient fell, he did not want to come in for PT

## 2016-04-25 ENCOUNTER — Ambulatory Visit: Payer: Medicare Other | Admitting: Physical Therapy

## 2016-04-27 ENCOUNTER — Ambulatory Visit: Payer: Medicare Other | Admitting: Physical Therapy

## 2016-05-01 ENCOUNTER — Ambulatory Visit: Payer: Medicare Other | Admitting: Physical Therapy

## 2016-05-01 ENCOUNTER — Telehealth: Payer: Self-pay

## 2016-05-01 NOTE — Telephone Encounter (Signed)
05/01/26 patient's daughter called and said her dad does not want to come back to PT, said okay to d/c him

## 2016-05-03 ENCOUNTER — Other Ambulatory Visit: Payer: Self-pay | Admitting: Family Medicine

## 2016-05-03 ENCOUNTER — Other Ambulatory Visit: Payer: Self-pay | Admitting: Internal Medicine

## 2016-05-03 ENCOUNTER — Encounter: Payer: Self-pay | Admitting: Internal Medicine

## 2016-05-03 ENCOUNTER — Ambulatory Visit (INDEPENDENT_AMBULATORY_CARE_PROVIDER_SITE_OTHER): Payer: Medicare Other | Admitting: Internal Medicine

## 2016-05-03 VITALS — BP 126/66 | HR 69 | Temp 98.1°F | Resp 14 | Ht 70.0 in | Wt 162.5 lb

## 2016-05-03 DIAGNOSIS — M810 Age-related osteoporosis without current pathological fracture: Secondary | ICD-10-CM | POA: Diagnosis not present

## 2016-05-03 DIAGNOSIS — A0472 Enterocolitis due to Clostridium difficile, not specified as recurrent: Secondary | ICD-10-CM

## 2016-05-03 MED ORDER — VANCOMYCIN HCL 125 MG PO CAPS: ORAL_CAPSULE | ORAL | 0 refills | Status: DC

## 2016-05-03 MED ORDER — DENOSUMAB 60 MG/ML ~~LOC~~ SOLN
60.0000 mg | Freq: Once | SUBCUTANEOUS | Status: AC
Start: 2016-05-03 — End: 2016-05-03
  Administered 2016-05-03: 60 mg via SUBCUTANEOUS

## 2016-05-03 NOTE — Progress Notes (Signed)
Pre visit review using our clinic review tool, if applicable. No additional management support is needed unless otherwise documented below in the visit note. 

## 2016-05-03 NOTE — Progress Notes (Signed)
Subjective:    Patient ID: Micheal Frank, male    DOB: 11-24-1925, 82 y.o.   MRN: [id number]  DOS:  05/03/2016 Type of visit - description : f/u here w/ his daughter  Interval history: Taking vancomycin as prescribed without apparent side effects. Feels better, stools are now more solid Also, daughter is concerned about falls due to imbalance.  Review of Systems Denies fever chills No nausea vomiting. No blood in the stools.  Past Medical History:  Diagnosis Date  . Arthritis    left leg also has swelling  . Asthma   . Blood dyscrasia    pt CAN NOT have any blood thinners d/t hx brain bleed  . Chronic kidney disease, stage III (moderate) 01/20/2013  . Complication of anesthesia    confusion with anesthesia  . Constipation    miralax prn  . COPD (chronic obstructive pulmonary disease) (HCC)    emphysema  . Depression    takes wellbutrin bid  . Epidural abscess   . Gastric ulcer   . History of blood clots   . Hyperlipidemia   . Melanoma ([place])   . Peripheral neuropathy ([place])   . Pneumonia    as a child and in 1952;had a pneumonia vaccine couple of years ago  . Prosthetic joint infection ([place])   . S/P right heart catheterization    at least 54yrs  . Shingles   . Short-term memory loss   . Shortness of breath    with exertion  . Sleep apnea    has CPAP but doesn't use it;sleep study done at least 76yrs ago  . Staphylococcus aureus bacteremia with sepsis ([place])   . Subdural hematoma ([place]) 03/30/11  . Urinary frequency    wears depends    Past Surgical History:  Procedure Laterality Date  . epidural abscess drainage  12/2010  . 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 EYE SURGERY     bil cataract surgery  . FILTERING PROCEDURE     filter placed in neck d/t blood  clot  . HERNIA REPAIR     25+yrs ago  . KNEE ARTHROSCOPY    . SKIN BIOPSY     melanoma on head   . TOTAL KNEE ARTHROPLASTY     x 2 left knee 2003/2012  . TOTAL KNEE  REVISION  05/23/2011   Procedure: TOTAL KNEE REVISION;  Surgeon: Meredith Pel;  Location: [place];  Service: Orthopedics;  Laterality: Left;  LEFT KNEE REMOVAL OF SPACER, POSSIBLE REIMPLANTATION OF REVISION TKA VERSES REPLACEMENT ANTIBIOTIC SPACER    Social History   Social History  . Marital status: Married    Spouse name: N/A  . Number of children: 3  . Years of education: N/A   Occupational History  . retired    Social History Main Topics  . Smoking status: Former Smoker    Packs/day: 2.00    Years: 40.00    Types: Cigarettes    Quit date: 07/17/1978  . Smokeless tobacco: Never Used  . Alcohol use No  . Drug use: No  . Sexual activity: Not on file   Other Topics Concern  . Not on file   Social History Narrative   Lost wife 06-2013   Lives in his house, 1 daughter and her 2 adult children live w/ him (1g-child w/ special needs)           Medication List  Accurate as of 05/03/16 11:35 AM. Always use your most recent med list.          acetaminophen 325 MG tablet Commonly known as:  TYLENOL Take 2 tablets (650 mg total) by mouth every 6 (six) hours as needed for mild pain (or Fever >/= 101).   albuterol 108 (90 Base) MCG/ACT inhaler Commonly known as:  PROAIR HFA Inhale 2 puffs into the lungs every 6 (six) hours as needed for wheezing.   AZO CRANBERRY URINARY TRACT 250-60 MG Caps Generic drug:  Cranberry-Vitamin C Take 1 tablet by mouth daily.   benzonatate 200 MG capsule Commonly known as:  TESSALON TAKE ONE CAPSULE 3-4 TIMES DAILY AS NEEDED FOR COUGH   budesonide-formoterol 160-4.5 MCG/ACT inhaler Commonly known as:  SYMBICORT Inhale 2 puffs into the lungs 2 (two) times daily.   buPROPion 200 MG 12 hr tablet Commonly known as:  WELLBUTRIN SR Take 1 tablet (200 mg total) by mouth 2 (two) times daily.   calcium carbonate 600 MG Tabs tablet Commonly known as:  OS-CAL Take 600 mg by mouth daily.   diazepam 5 MG tablet Commonly known as:   VALIUM Take 1 tablet (5 mg total) by mouth every 6 (six) hours as needed for anxiety.   doxazosin 4 MG tablet Commonly known as:  CARDURA Take 1 tablet (4 mg total) by mouth at bedtime.   fluticasone 50 MCG/ACT nasal spray Commonly known as:  FLONASE Place 2 sprays into the nose 2 (two) times daily.   hydrocortisone 2.5 % cream Apply topically 2 (two) times daily.   meclizine 25 MG tablet Commonly known as:  ANTIVERT Take 1 tablet (25 mg total) by mouth daily as needed (vertigo).   montelukast 10 MG tablet Commonly known as:  SINGULAIR Take 1 tablet (10 mg total) by mouth daily. Reported on 08/04/2015   multivitamin with minerals Tabs tablet Take 1 tablet by mouth daily.   rOPINIRole 1 MG tablet Commonly known as:  REQUIP Take 1 tablet (1 mg total) by mouth at bedtime.   saccharomyces boulardii 250 MG capsule Commonly known as:  FLORASTOR Take 1 capsule (250 mg total) by mouth 2 (two) times daily.   simvastatin 40 MG tablet Commonly known as:  ZOCOR Take 1 tablet (40 mg total) by mouth at bedtime.   tamsulosin 0.4 MG Caps capsule Commonly known as:  FLOMAX Take 1 capsule (0.4 mg total) by mouth daily.   triamterene-hydrochlorothiazide 37.5-25 MG tablet Commonly known as:  MAXZIDE-25 Take 0.5 tablets by mouth daily.   URELLE 81 MG Tabs tablet Take 1 tablet (81 mg total) by mouth every 6 (six) hours as needed for bladder spasms.   vancomycin 125 MG capsule Commonly known as:  VANCOCIN Take 1 capsule (125 mg total) by mouth 4 (four) times daily.          Objective:   Physical Exam BP 126/66 (BP Location: Right Arm, Patient Position: Sitting, Cuff Size: Small)   Pulse 69   Temp 98.1 F (36.7 C) (Oral)   Resp 14   Ht 5\' 10"  (1.778 m)   Wt 162 lb 8 oz (73.7 kg)   SpO2 96%   BMI 23.32 kg/m  General:   Well developed, well nourished . NAD.  HEENT:  Normocephalic . Face symmetric, atraumatic Abdomen: Not distended, soft, bowel sounds present. Skin: Not  pale. Not jaundice Neurologic:  alert & oriented X3.  Speech normal, gait  assisted by a cane, slow, mild to moderately unsteady Psych--  Cognition and judgment appear intact.  Cooperative with normal attention span and concentration.  Behavior appropriate. No anxious or depressed appearing.      Assessment & Plan:   Assessment > COPD- Dr Melvyn Novas 08-2015, stable, RTC PRN CKD -----Creatinine 1.67, GFR 32 Hyperlipidemia Depression  Decreased memory Neuropathy H/o Mnire's  -- on diuretics , valium, meclizine Osteoporosis -- h/o pelvic Fx, dexa April 2016: T score (-) 2.3;  rx fosamax 03-2015, dc after 3 months d/t dysphagia; prolia #1 05-03-16 DJD  -- hydrocodone rarely RLS OSA no CPAP GU: --H/o incontinence, saw Dr Karsten Ro, OV ~ 2013 --Urinary retention 02-2016: sepsis, admitted, had a foley temporarily  H/o melanoma H/o subdural hematoma, epidural abscess, staph aureus  sepsis H/o Prosthetic joint infection H/o Blood clots H/o gastric ulcer C diff dx 10-19-15, s/p flagyl x 2  Coordinate care with his daughter [name]  [phone]  PLAN: C. difficile diarrhea: Improved with vancomycin 4 times a day, will taper it down. See below. Continue probiotics. 125 mg orally 3 times a day  for 2 weeks  125 mg orally 2 tmes a day for  2 weeks   125 mg orally once daily for 2 weeks   Gait difficulty, falls: had PT already, has a walker at home but not using it. Risk of fall, hip fracture etc. d/w the patient, encouraged consistent use of a walker. Osteoporosis: Prolia #1 today RTC 2 months

## 2016-05-03 NOTE — Patient Instructions (Signed)
Continue vancomycin: 125 mg orally 3 times a day  for 2 weeks  125 mg orally 2 tmes a day for  2 weeks   125 mg orally once daily for 2 weeks  Continue your probiotic until you see me next  Next visit in 2 months  Call anytime if the diarrhea comes back

## 2016-05-04 ENCOUNTER — Ambulatory Visit: Payer: Medicare Other | Admitting: Physical Therapy

## 2016-05-04 DIAGNOSIS — A0472 Enterocolitis due to Clostridium difficile, not specified as recurrent: Secondary | ICD-10-CM | POA: Insufficient documentation

## 2016-05-04 NOTE — Assessment & Plan Note (Signed)
C. difficile diarrhea: Improved with vancomycin 4 times a day, will taper it down. See below. Continue probiotics. 125 mg orally 3 times a day  for 2 weeks  125 mg orally 2 tmes a day for  2 weeks   125 mg orally once daily for 2 weeks   Gait difficulty, falls: had PT already, has a walker at home but not using it. Risk of fall, hip fracture etc. d/w the patient, encouraged consistent use of a walker. Osteoporosis: Prolia #1 today RTC 2 months

## 2016-05-05 ENCOUNTER — Telehealth: Payer: Self-pay | Admitting: Internal Medicine

## 2016-05-08 MED ORDER — ROPINIROLE HCL 1 MG PO TABS: 1.0000 mg | ORAL_TABLET | Freq: Every day | ORAL | 6 refills | Status: DC

## 2016-05-08 NOTE — Telephone Encounter (Signed)
Transmission failed at [facility], will try to send Rx again.

## 2016-05-08 NOTE — Addendum Note (Signed)
Addended byDamita Dunnings D on: 05/08/2016 09:18 AM   Modules accepted: Orders

## 2016-05-08 NOTE — Addendum Note (Signed)
Addended byDamita Dunnings D on: 05/08/2016 09:21 AM   Modules accepted: Orders

## 2016-05-08 NOTE — Telephone Encounter (Signed)
Refill request received on 05/05/2016 for Requip was sent to Dr. Melvyn Novas?? However, Rx has been refilled to [facility].

## 2016-05-08 NOTE — Telephone Encounter (Addendum)
Caller name:Cutler,Lynn Relation to pt: daughter Call back number: [phone]  Pharmacy: [id number], [place] [id number] (Phone) [phone] (Fax)     Reason for call:  Daughter checking on the status of rOPINIRole (REQUIP) 1 MG tablet request.

## 2016-05-15 ENCOUNTER — Ambulatory Visit: Payer: Medicare Other | Admitting: Internal Medicine

## 2016-06-11 ENCOUNTER — Other Ambulatory Visit: Payer: Self-pay | Admitting: Internal Medicine

## 2016-06-19 ENCOUNTER — Other Ambulatory Visit: Payer: Self-pay | Admitting: Internal Medicine

## 2016-07-04 ENCOUNTER — Ambulatory Visit: Payer: Medicare Other | Admitting: Internal Medicine

## 2016-07-05 ENCOUNTER — Telehealth: Payer: Self-pay | Admitting: *Deleted

## 2016-07-05 NOTE — Telephone Encounter (Signed)
Pt's daughter declined AWV.

## 2016-07-07 ENCOUNTER — Encounter: Payer: Self-pay | Admitting: Internal Medicine

## 2016-07-07 ENCOUNTER — Ambulatory Visit (INDEPENDENT_AMBULATORY_CARE_PROVIDER_SITE_OTHER): Payer: Medicare Other | Admitting: Internal Medicine

## 2016-07-07 VITALS — BP 118/74 | HR 81 | Temp 97.6°F | Resp 14 | Ht 70.0 in | Wt 156.2 lb

## 2016-07-07 DIAGNOSIS — A0472 Enterocolitis due to Clostridium difficile, not specified as recurrent: Secondary | ICD-10-CM | POA: Diagnosis not present

## 2016-07-07 DIAGNOSIS — G2581 Restless legs syndrome: Secondary | ICD-10-CM | POA: Diagnosis not present

## 2016-07-07 DIAGNOSIS — H8109 Meniere's disease, unspecified ear: Secondary | ICD-10-CM | POA: Diagnosis not present

## 2016-07-07 DIAGNOSIS — F32A Depression, unspecified: Secondary | ICD-10-CM

## 2016-07-07 DIAGNOSIS — R1310 Dysphagia, unspecified: Secondary | ICD-10-CM

## 2016-07-07 DIAGNOSIS — F329 Major depressive disorder, single episode, unspecified: Secondary | ICD-10-CM

## 2016-07-07 LAB — CBC WITH DIFFERENTIAL/PLATELET
BASOS ABS: 0 10*3/uL (ref 0.0–0.1)
Basophils Relative: 0.3 % (ref 0.0–3.0)
EOS PCT: 1.2 % (ref 0.0–5.0)
Eosinophils Absolute: 0.1 10*3/uL (ref 0.0–0.7)
HCT: 43.1 % (ref 39.0–52.0)
Hemoglobin: 14.3 g/dL (ref 13.0–17.0)
LYMPHS ABS: 1 10*3/uL (ref 0.7–4.0)
Lymphocytes Relative: 9.5 % — ABNORMAL LOW (ref 12.0–46.0)
MCHC: 33.3 g/dL (ref 30.0–36.0)
MCV: 93.6 fl (ref 78.0–100.0)
MONO ABS: 0.9 10*3/uL (ref 0.1–1.0)
Monocytes Relative: 8.7 % (ref 3.0–12.0)
NEUTROS PCT: 80.3 % — AB (ref 43.0–77.0)
Neutro Abs: 8.3 10*3/uL — ABNORMAL HIGH (ref 1.4–7.7)
Platelets: 254 10*3/uL (ref 150.0–400.0)
RBC: 4.6 Mil/uL (ref 4.22–5.81)
RDW: 13.8 % (ref 11.5–15.5)
WBC: 10.3 10*3/uL (ref 4.0–10.5)

## 2016-07-07 LAB — BASIC METABOLIC PANEL
BUN: 21 mg/dL (ref 6–23)
CALCIUM: 8.7 mg/dL (ref 8.4–10.5)
CO2: 31 mEq/L (ref 19–32)
Chloride: 105 mEq/L (ref 96–112)
Creatinine, Ser: 1.57 mg/dL — ABNORMAL HIGH (ref 0.40–1.50)
GFR: 44.44 mL/min — AB (ref >60.00)
GLUCOSE: 78 mg/dL (ref 70–99)
Potassium: 3.6 mEq/L (ref 3.5–5.1)
Sodium: 143 mEq/L (ref 135–145)

## 2016-07-07 MED ORDER — BUPROPION HCL ER (SR) 200 MG PO TB12: 200.0000 mg | ORAL_TABLET | Freq: Two times a day (BID) | ORAL | 1 refills | Status: DC

## 2016-07-07 MED ORDER — ROPINIROLE HCL 1 MG PO TABS: 1.0000 mg | ORAL_TABLET | Freq: Every day | ORAL | 1 refills | Status: DC

## 2016-07-07 NOTE — Progress Notes (Signed)
Pre visit review using our clinic review tool, if applicable. No additional management support is needed unless otherwise documented below in the visit note. 

## 2016-07-07 NOTE — Progress Notes (Signed)
Subjective:    Patient ID: Micheal Frank, male    DOB: 16-Feb-1926, 82 y.o.   MRN: [id number]  DOS:  07/07/2016 Type of visit - description : Follow-up from previous visit Interval history: C. difficile: finished vancomycin, actually symptoms quickly resolved, currently with no diarrhea, he does have loose stools which is at his baseline. Complaining of "choking" with certain foods, sx started ~ a year ago, usually with soft foods like pudding. No problems with liquids. Depression: Well controlled, needs a refill.    Review of Systems Denies actual odynophagia or food getting stuck in the chest. Denies any recent headache, dizziness, diplopia or slurred speech.  Past Medical History:  Diagnosis Date  . Arthritis    left leg also has swelling  . Asthma   . Blood dyscrasia    pt CAN NOT have any blood thinners d/t hx brain bleed  . Chronic kidney disease, stage III (moderate) 01/20/2013  . Complication of anesthesia    confusion with anesthesia  . Constipation    miralax prn  . COPD (chronic obstructive pulmonary disease) (HCC)    emphysema  . Depression    takes wellbutrin bid  . Epidural abscess   . Gastric ulcer   . History of blood clots   . Hyperlipidemia   . Melanoma ([place])   . Peripheral neuropathy ([place])   . Pneumonia    as a child and in 1952;had a pneumonia vaccine couple of years ago  . Prosthetic joint infection ([place])   . S/P right heart catheterization    at least 70yrs  . Shingles   . Short-term memory loss   . Shortness of breath    with exertion  . Sleep apnea    has CPAP but doesn't use it;sleep study done at least 45yrs ago  . Staphylococcus aureus bacteremia with sepsis ([place])   . Subdural hematoma ([place]) 03/30/11  . Urinary frequency    wears depends    Past Surgical History:  Procedure Laterality Date  . epidural abscess drainage  12/2010  . 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 EYE SURGERY     bil cataract  surgery  . FILTERING PROCEDURE     filter placed in neck d/t blood  clot  . HERNIA REPAIR     25+yrs ago  . KNEE ARTHROSCOPY    . SKIN BIOPSY     melanoma on head   . TOTAL KNEE ARTHROPLASTY     x 2 left knee 2003/2012  . TOTAL KNEE REVISION  05/23/2011   Procedure: TOTAL KNEE REVISION;  Surgeon: Meredith Pel;  Location: [place];  Service: Orthopedics;  Laterality: Left;  LEFT KNEE REMOVAL OF SPACER, POSSIBLE REIMPLANTATION OF REVISION TKA VERSES REPLACEMENT ANTIBIOTIC SPACER    Social History   Social History  . Marital status: Married    Spouse name: N/A  . Number of children: 3  . Years of education: N/A   Occupational History  . retired    Social History Main Topics  . Smoking status: Former Smoker    Packs/day: 2.00    Years: 40.00    Types: Cigarettes    Quit date: 07/17/1978  . Smokeless tobacco: Never Used  . Alcohol use No  . Drug use: No  . Sexual activity: Not on file   Other Topics Concern  . Not on file   Social History Narrative   Lost wife [phone]  Lives in his house, 1 daughter and her 2 adult children live w/ him (1g-child w/ special needs)         Allergies as of 07/07/2016      Reactions   Anticoagulant Compound    History of bleeding on the brain per daughter   Protonix [pantoprazole Sodium] Diarrhea      Medication List       Accurate as of 07/07/16 11:59 PM. Always use your most recent med list.          acetaminophen 325 MG tablet Commonly known as:  TYLENOL Take 2 tablets (650 mg total) by mouth every 6 (six) hours as needed for mild pain (or Fever >/= 101).   albuterol 108 (90 Base) MCG/ACT inhaler Commonly known as:  PROAIR HFA Inhale 2 puffs into the lungs every 6 (six) hours as needed for wheezing.   AZO CRANBERRY URINARY TRACT 250-60 MG Caps Generic drug:  Cranberry-Vitamin C Take 1 tablet by mouth daily.   benzonatate 200 MG capsule Commonly known as:  TESSALON TAKE ONE CAPSULE 3-4 TIMES DAILY AS NEEDED FOR  COUGH   budesonide-formoterol 160-4.5 MCG/ACT inhaler Commonly known as:  SYMBICORT Inhale 2 puffs into the lungs 2 (two) times daily.   buPROPion 200 MG 12 hr tablet Commonly known as:  WELLBUTRIN SR Take 1 tablet (200 mg total) by mouth 2 (two) times daily.   calcium carbonate 600 MG Tabs tablet Commonly known as:  OS-CAL Take 600 mg by mouth daily.   diazepam 5 MG tablet Commonly known as:  VALIUM Take 1 tablet (5 mg total) by mouth every 6 (six) hours as needed for anxiety.   doxazosin 4 MG tablet Commonly known as:  CARDURA Take 1 tablet (4 mg total) by mouth at bedtime.   fluticasone 50 MCG/ACT nasal spray Commonly known as:  FLONASE Place 2 sprays into the nose 2 (two) times daily.   hydrocortisone 2.5 % cream Apply topically 2 (two) times daily.   meclizine 25 MG tablet Commonly known as:  ANTIVERT Take 1 tablet (25 mg total) by mouth daily as needed (vertigo).   montelukast 10 MG tablet Commonly known as:  SINGULAIR take 1 tablet by mouth once daily   multivitamin with minerals Tabs tablet Take 1 tablet by mouth daily.   rOPINIRole 1 MG tablet Commonly known as:  REQUIP Take 1 tablet (1 mg total) by mouth at bedtime.   saccharomyces boulardii 250 MG capsule Commonly known as:  FLORASTOR Take 1 capsule (250 mg total) by mouth 2 (two) times daily.   simvastatin 40 MG tablet Commonly known as:  ZOCOR Take 1 tablet (40 mg total) by mouth daily.   tamsulosin 0.4 MG Caps capsule Commonly known as:  FLOMAX Take 1 capsule (0.4 mg total) by mouth daily.   triamterene-hydrochlorothiazide 37.5-25 MG tablet Commonly known as:  MAXZIDE-25 Take 0.5 tablets by mouth daily.   URELLE 81 MG Tabs tablet Take 1 tablet (81 mg total) by mouth every 6 (six) hours as needed for bladder spasms.          Objective:   Physical Exam BP 118/74 (BP Location: Right Arm, Patient Position: Sitting, Cuff Size: Normal)   Pulse 81   Temp 97.6 F (36.4 C) (Oral)   Resp 14    Ht 5\' 10"  (1.778 m)   Wt 156 lb 4 oz (70.9 kg)   SpO2 98%   BMI 22.42 kg/m  General:   Well developed, well nourished . NAD.  HEENT:  Normocephalic . Face symmetric, atraumatic Neck: Symmetric, no mass or LAD Lungs: Clear to auscultation Heart: Systolic murmur. Abdomen: Not distended, soft  Skin: Not pale. Not jaundice Neurologic:  alert & oriented X3.  Speech normal, gait  assisted by a cane, slow, mild to moderately unsteady Psych--  Cognition and judgment appear intact.  Cooperative with normal attention span and concentration.  Behavior appropriate. No anxious or depressed appearing.     Assessment & Plan:   Assessment > COPD- Dr Melvyn Novas 08-2015, stable, RTC PRN CKD -----Creatinine 1.67, GFR 32 Hyperlipidemia Depression  NEURO: --Decreased memory --Anisocoria- L pupil larger ,s/p surgery --Neuropathy --H/o Mnire's  -- on diuretics , valium, meclizine Osteoporosis -- h/o pelvic Fx, dexa April 2016: T score (-) 2.3;  rx fosamax 03-2015, dc after 3 months d/t dysphagia; prolia #1 05-03-16 DJD  -- hydrocodone rarely RLS OSA no CPAP GU: --H/o incontinence, saw Dr Karsten Ro, OV ~ 2013 --Urinary retention 02-2016: sepsis, admitted, had a foley temporarily  H/o melanoma H/o subdural hematoma, epidural abscess, staph aureus  sepsis H/o Prosthetic joint infection H/o Blood clots H/o gastric ulcer C diff dx 10-19-15, s/p flagyl x 2 , diarrhea again 03-2016, better after vancomycin. Coordinate care with his daughter [name]  [phone]  PLAN: C. difficile: Sx resolved with vancomycin, recommend to continue with probiotics. Checking a CBC. History of Mnire's, on diuretics, also CRI. Check a BMP Oropharyngeal dysphagia: Was seen with similar symptoms 07-2015, at the time he was referred to GI for possible EGD. That did not happen to my knowledge. At this time the problem seems to be more oropharyngeal coordination. Plan: refer to  speech therapy. depression: Well-controlled, RF  Wellbutrin. RLS: Refill Requip. RTC 4 months.

## 2016-07-07 NOTE — Patient Instructions (Signed)
GO TO THE LAB : Get the blood work     GO TO THE FRONT DESK Schedule your next appointment for a   Routine checkup in 4-5 months   

## 2016-07-09 DIAGNOSIS — F329 Major depressive disorder, single episode, unspecified: Secondary | ICD-10-CM | POA: Insufficient documentation

## 2016-07-09 DIAGNOSIS — F32A Depression, unspecified: Secondary | ICD-10-CM | POA: Insufficient documentation

## 2016-07-09 DIAGNOSIS — F419 Anxiety disorder, unspecified: Secondary | ICD-10-CM | POA: Insufficient documentation

## 2016-07-09 NOTE — Assessment & Plan Note (Signed)
C. difficile: Sx resolved with vancomycin, recommend to continue with probiotics. Checking a CBC. History of Mnire's, on diuretics, also CRI. Check a BMP Oropharyngeal dysphagia: Was seen with similar symptoms 07-2015, at the time he was referred to GI for possible EGD. That did not happen to my knowledge. At this time the problem seems to be more oropharyngeal coordination. Plan: refer to  speech therapy. depression: Well-controlled, RF Wellbutrin. RLS: Refill Requip. RTC 4 months.

## 2016-07-13 ENCOUNTER — Other Ambulatory Visit: Payer: Self-pay

## 2016-07-13 DIAGNOSIS — R1310 Dysphagia, unspecified: Secondary | ICD-10-CM

## 2016-07-19 ENCOUNTER — Other Ambulatory Visit (HOSPITAL_COMMUNITY): Payer: Self-pay | Admitting: Internal Medicine

## 2016-07-19 DIAGNOSIS — R1319 Other dysphagia: Secondary | ICD-10-CM

## 2016-07-21 ENCOUNTER — Other Ambulatory Visit: Payer: Self-pay | Admitting: Internal Medicine

## 2016-07-24 ENCOUNTER — Ambulatory Visit (INDEPENDENT_AMBULATORY_CARE_PROVIDER_SITE_OTHER): Payer: Medicare Other

## 2016-07-24 ENCOUNTER — Ambulatory Visit (INDEPENDENT_AMBULATORY_CARE_PROVIDER_SITE_OTHER): Payer: Medicare Other | Admitting: Orthopedic Surgery

## 2016-07-24 ENCOUNTER — Encounter (INDEPENDENT_AMBULATORY_CARE_PROVIDER_SITE_OTHER): Payer: Self-pay | Admitting: Orthopedic Surgery

## 2016-07-24 ENCOUNTER — Telehealth (INDEPENDENT_AMBULATORY_CARE_PROVIDER_SITE_OTHER): Payer: Self-pay | Admitting: Orthopedic Surgery

## 2016-07-24 ENCOUNTER — Ambulatory Visit (INDEPENDENT_AMBULATORY_CARE_PROVIDER_SITE_OTHER): Payer: Self-pay

## 2016-07-24 DIAGNOSIS — M545 Low back pain, unspecified: Secondary | ICD-10-CM

## 2016-07-24 DIAGNOSIS — G8929 Other chronic pain: Secondary | ICD-10-CM

## 2016-07-24 DIAGNOSIS — M25562 Pain in left knee: Secondary | ICD-10-CM

## 2016-07-24 DIAGNOSIS — Z96652 Presence of left artificial knee joint: Secondary | ICD-10-CM

## 2016-07-24 NOTE — Telephone Encounter (Signed)
Cb#: [phone]  Dr.[name] told me and patient he was writing  an  rx for pain medication (he said he would repeat the last medication he filled ) at 5:10 this evening, at 5:30 I called to check status, dr.dean was gone and no assistant knew about rx. Dr.xu was going to write but couldn't find what the patient has taken previously (daughter says hydrocodone)  Please call and discuss.

## 2016-07-25 ENCOUNTER — Telehealth (INDEPENDENT_AMBULATORY_CARE_PROVIDER_SITE_OTHER): Payer: Self-pay | Admitting: *Deleted

## 2016-07-25 MED ORDER — HYDROCODONE-ACETAMINOPHEN 5-325 MG PO TABS: ORAL_TABLET | ORAL | 0 refills | Status: DC

## 2016-07-25 NOTE — Telephone Encounter (Signed)
Pt daughter called stating they left yesterday without RX that Dr. Marlou Sa was going to write. Pt daughter wanting to get this and pick it up

## 2016-07-25 NOTE — Telephone Encounter (Signed)
Rx done, will have Xu sign then call patient to advise ready to pickup.

## 2016-07-25 NOTE — Telephone Encounter (Signed)
IC pt and advised Rx ready for pickup now

## 2016-07-25 NOTE — Progress Notes (Signed)
Office Visit Note   Patient: Micheal Frank           Date of Birth: [birth year]           MRN: [id number] Visit Date: 07/24/2016 Requested by: Colon Branch, MD [address] [place], [place] [address] PCP: Kathlene November, MD  Subjective: Chief Complaint  Patient presents with  . Left Knee - Pain, Weakness  . Lower Back - Pain    HPI Micheal Frank is an 83 year old patient with left leg pain and low back pain.  Been going on for a few months.  Describes midline low back pain without radiculopathy.  However at times he does describe some numbness and tingling in his legs and that his legs "feel funny".  In the mornings the left knee doesn't want to work.  His daughter is with him he states that he's been having some dizziness issues.  Takes Aleve and oxycodone.  States he needs a refill of that.  I reviewed his records from about 12 months ago when he gets some occasional pain medicine but it doesn't look like he is abusing it.  He does have a history of having decompressive surgery in his back with a subsequent infection in 2012.  He does describe fairly insidious onset of low back pain over the past 2-3 months.  He has had an extensive left knee revision.  He is doing well with that.              Review of Systems All systems reviewed are negative as they relate to the chief complaint within the history of present illness.  Patient denies  fevers or chills.    Assessment & Plan: Visit Diagnoses:  1. Presence of left artificial knee joint   2. Chronic pain of left knee   3. Chronic midline low back pain without sciatica     Plan: Impression is low back pain possible recurrent infection.  His incision looks pretty reasonable without redness he's not had any fevers but he does have this pain.  The left knee looks good.  Needs MRI scan of that back to evaluate for infection.  The real question is whether or not he is a candidate for any type of injection in his back.  We'll see what  the scan shows and make a are best clinical judgment about that can help him.  No intervention required for the left knee I did refill his pain medicine 1 Radiographs were obtained and their report is in the chart Follow-Up Instructions: Return for after MRI.   Orders:  Orders Placed This Encounter  Procedures  . XR Knee 1-2 Views Left  . XR Lumbar Spine 2-3 Views  . MR Lumbar Spine w/o contrast   No orders of the defined types were placed in this encounter.     Procedures: No procedures performed   Clinical Data: No additional findings.  Objective: Vital Signs: There were no vitals taken for this visit.  Physical Exam   Constitutional: Patient appears well-developed HEENT:  Head: Normocephalic Eyes:EOM are normal Neck: Normal range of motion Cardiovascular: Normal rate Pulmonary/chest: Effort normal Neurologic: Patient is alert Skin: Skin is warm Psychiatric: Patient has normal mood and affect    Ortho Exam examination the left knee demonstrates full extension flexion past 90 collaterals are stable there is no effusion or warmth to the left knee pedal pulses palpable no groin pain with internal/external rotation of the leg has good ankle  dorsi flexion plantar flexion quite hamstring strength.  Patient has present symmetric and good hip flexion strength.  No muscle atrophy is noted.  Back incision nontender intact no redness no erythema  Specialty Comments:  No specialty comments available.  Imaging: No results found.   PMFS History: Patient Active Problem List   Diagnosis Date Noted  . Presence of left artificial knee joint 07/24/2016  . Chronic pain of left knee 07/24/2016  . Chronic midline low back pain without sciatica 07/24/2016  . Depression 07/09/2016  . C. difficile diarrhea 05/04/2016  . Chronic cough 08/16/2015  . PCP NOTES >>>>>> 04/06/2015  . CTS (carpal tunnel syndrome) 11/16/2014  . Hyperlipidemia 08/17/2014  . Sleep apnea 08/17/2014  .  Superficial bruising of chest wall 07/29/2014  . Urgency incontinence 01/23/2013  . Chronic kidney disease, stage III (moderate) 01/20/2013  . At high risk for falls 11/28/2012  . ALLERGIC RHINITIS 12/09/2009  . CARCINOMA IN SITU, SKIN 04/30/2008  . RESTLESS LEG SYNDROME 04/30/2008  . NEUROPATHY 04/30/2008  . GLAUCOMA, BORDERLINE 04/30/2008  . Meniere's disease 04/30/2008  . HEARING LOSS 04/30/2008  . COPD GOLD II  04/30/2008  . DJD (degenerative joint disease) 04/30/2008  . Osteoporosis 04/30/2008  . SLEEP APNEA 04/30/2008  . HISTORY OF ASBESTOS EXPOSURE 04/30/2008   Past Medical History:  Diagnosis Date  . Arthritis    left leg also has swelling  . Asthma   . Blood dyscrasia    pt CAN NOT have any blood thinners d/t hx brain bleed  . Chronic kidney disease, stage III (moderate) 01/20/2013  . Complication of anesthesia    confusion with anesthesia  . Constipation    miralax prn  . COPD (chronic obstructive pulmonary disease) (HCC)    emphysema  . Depression    takes wellbutrin bid  . Epidural abscess   . Gastric ulcer   . History of blood clots   . Hyperlipidemia   . Melanoma ([place])   . Peripheral neuropathy ([place])   . Pneumonia    as a child and in 1952;had a pneumonia vaccine couple of years ago  . Prosthetic joint infection ([place])   . S/P right heart catheterization    at least 61yrs  . Shingles   . Short-term memory loss   . Shortness of breath    with exertion  . Sleep apnea    has CPAP but doesn't use it;sleep study done at least 58yrs ago  . Staphylococcus aureus bacteremia with sepsis ([place])   . Subdural hematoma ([place]) 03/30/11  . Urinary frequency    wears depends    Family History  Problem Relation Age of Onset  . Melanoma Brother   . CAD Neg Hx   . Diabetes Neg Hx     Past Surgical History:  Procedure Laterality Date  . epidural abscess drainage  12/2010  . 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 EYE SURGERY     bil  cataract surgery  . FILTERING PROCEDURE     filter placed in neck d/t blood  clot  . HERNIA REPAIR     25+yrs ago  . KNEE ARTHROSCOPY    . SKIN BIOPSY     melanoma on head   . TOTAL KNEE ARTHROPLASTY     x 2 left knee 2003/2012  . TOTAL KNEE REVISION  05/23/2011   Procedure: TOTAL KNEE REVISION;  Surgeon: Meredith Pel;  Location: [facility];  Service: Orthopedics;  Laterality: Left;  LEFT KNEE REMOVAL OF SPACER, POSSIBLE REIMPLANTATION OF REVISION TKA VERSES REPLACEMENT ANTIBIOTIC SPACER   Social History   Occupational History  . retired    Social History Main Topics  . Smoking status: Former Smoker    Packs/day: 2.00    Years: 40.00    Types: Cigarettes    Quit date: 07/17/1978  . Smokeless tobacco: Never Used  . Alcohol use No  . Drug use: No  . Sexual activity: Not on file

## 2016-07-25 NOTE — Telephone Encounter (Signed)
yes

## 2016-07-25 NOTE — Telephone Encounter (Signed)
See message.  Dr Marlou Sa confirmed he meant to write Rx hydrocodone 5/325 #60 1 po q 12 hrs.  Can I print Rx under your name and you sign?

## 2016-08-02 ENCOUNTER — Ambulatory Visit (HOSPITAL_COMMUNITY): Payer: Medicare Other

## 2016-08-02 ENCOUNTER — Other Ambulatory Visit (HOSPITAL_COMMUNITY): Payer: Medicare Other

## 2016-08-06 ENCOUNTER — Other Ambulatory Visit: Payer: Self-pay | Admitting: Internal Medicine

## 2016-08-10 ENCOUNTER — Ambulatory Visit (HOSPITAL_COMMUNITY)
Admission: RE | Admit: 2016-08-10 | Discharge: 2016-08-10 | Disposition: A | Discharge status: Home / Self Care | Payer: Medicare Other | Source: Ambulatory Visit | Attending: Internal Medicine | Admitting: Internal Medicine

## 2016-08-10 DIAGNOSIS — R1310 Dysphagia, unspecified: Secondary | ICD-10-CM | POA: Insufficient documentation

## 2016-08-10 DIAGNOSIS — R1319 Other dysphagia: Secondary | ICD-10-CM | POA: Insufficient documentation

## 2016-08-10 DIAGNOSIS — K449 Diaphragmatic hernia without obstruction or gangrene: Secondary | ICD-10-CM | POA: Diagnosis not present

## 2016-08-14 ENCOUNTER — Ambulatory Visit
Admission: RE | Admit: 2016-08-14 | Discharge: 2016-08-14 | Disposition: A | Discharge status: Home / Self Care | Payer: Medicare Other | Source: Ambulatory Visit | Attending: Orthopedic Surgery | Admitting: Orthopedic Surgery

## 2016-08-14 DIAGNOSIS — M545 Low back pain: Principal | ICD-10-CM

## 2016-08-14 DIAGNOSIS — G8929 Other chronic pain: Secondary | ICD-10-CM

## 2016-09-20 ENCOUNTER — Telehealth: Payer: Self-pay | Admitting: Internal Medicine

## 2016-09-20 MED ORDER — ROPINIROLE HCL 1 MG PO TABS: 1.0000 mg | ORAL_TABLET | Freq: Every day | ORAL | 3 refills | Status: DC

## 2016-09-20 MED ORDER — TAMSULOSIN HCL 0.4 MG PO CAPS: 0.4000 mg | ORAL_CAPSULE | Freq: Every day | ORAL | 3 refills | Status: DC

## 2016-09-20 MED ORDER — BUPROPION HCL ER (SR) 200 MG PO TB12: 200.0000 mg | ORAL_TABLET | Freq: Two times a day (BID) | ORAL | 3 refills | Status: DC

## 2016-09-20 MED ORDER — MONTELUKAST SODIUM 10 MG PO TABS: 10.0000 mg | ORAL_TABLET | Freq: Every day | ORAL | 3 refills | Status: DC

## 2016-09-20 MED ORDER — SIMVASTATIN 40 MG PO TABS: 40.0000 mg | ORAL_TABLET | Freq: Every day | ORAL | 3 refills | Status: DC

## 2016-09-20 NOTE — Telephone Encounter (Signed)
Rxs sent

## 2016-09-20 NOTE — Telephone Encounter (Signed)
Caller name: [name] Relationship to patient: Daughter Can be reached: [phone] Pharmacy:  [facility], [place] [phone] (Phone) [phone] (Fax)     Reason for call: Changed pharmacy. Need 90 day Rx for simvastatin (ZOCOR) 40 MG tablet  buPROPion (WELLBUTRIN SR) 200 MG 12 hr tablet [[id number] tamsulosin (FLOMAX) 0.4 MG CAPS capsule [[id number] montelukast (SINGULAIR) 10 MG tablet  rOPINIRole (REQUIP) 1 MG tablet   \

## 2016-10-02 ENCOUNTER — Telehealth: Payer: Self-pay | Admitting: Internal Medicine

## 2016-10-02 NOTE — Telephone Encounter (Signed)
Pt has f/u appt 11/08/2016 w/ PCP.

## 2016-10-02 NOTE — Telephone Encounter (Signed)
Prolia benefits verified No PA required Prolia and admin subject to 20% co-insurance  Patient may owe approximately $210 out of pocket  Due after 10/30/16

## 2016-10-06 ENCOUNTER — Encounter: Payer: Self-pay | Admitting: Internal Medicine

## 2016-10-23 ENCOUNTER — Telehealth: Payer: Self-pay | Admitting: Internal Medicine

## 2016-11-06 MED ORDER — DOXAZOSIN MESYLATE 4 MG PO TABS: 4.0000 mg | ORAL_TABLET | Freq: Every day | ORAL | 1 refills | Status: DC

## 2016-11-06 NOTE — Addendum Note (Signed)
Addended byDamita Dunnings D on: 11/06/2016 09:54 AM   Modules accepted: Orders

## 2016-11-06 NOTE — Telephone Encounter (Addendum)
Rx resent.

## 2016-11-06 NOTE — Telephone Encounter (Signed)
Pt's daughter called in to follow up on refill request. She says that pt receives the 90 day supply.    Pharmacy: [facility]

## 2016-11-08 ENCOUNTER — Ambulatory Visit (INDEPENDENT_AMBULATORY_CARE_PROVIDER_SITE_OTHER): Payer: Medicare Other | Admitting: Internal Medicine

## 2016-11-08 ENCOUNTER — Encounter: Payer: Self-pay | Admitting: Internal Medicine

## 2016-11-08 VITALS — BP 126/68 | HR 71 | Temp 97.5°F | Resp 14 | Ht 70.0 in | Wt 165.4 lb

## 2016-11-08 DIAGNOSIS — Z09 Encounter for follow-up examination after completed treatment for conditions other than malignant neoplasm: Secondary | ICD-10-CM

## 2016-11-08 DIAGNOSIS — N183 Chronic kidney disease, stage 3 unspecified: Secondary | ICD-10-CM

## 2016-11-08 DIAGNOSIS — G4730 Sleep apnea, unspecified: Secondary | ICD-10-CM | POA: Diagnosis not present

## 2016-11-08 DIAGNOSIS — F32A Depression, unspecified: Secondary | ICD-10-CM

## 2016-11-08 DIAGNOSIS — G2581 Restless legs syndrome: Secondary | ICD-10-CM

## 2016-11-08 DIAGNOSIS — D049 Carcinoma in situ of skin, unspecified: Secondary | ICD-10-CM

## 2016-11-08 DIAGNOSIS — J449 Chronic obstructive pulmonary disease, unspecified: Secondary | ICD-10-CM

## 2016-11-08 DIAGNOSIS — E785 Hyperlipidemia, unspecified: Secondary | ICD-10-CM

## 2016-11-08 DIAGNOSIS — F329 Major depressive disorder, single episode, unspecified: Secondary | ICD-10-CM | POA: Diagnosis not present

## 2016-11-08 LAB — COMPREHENSIVE METABOLIC PANEL
ALK PHOS: 56 U/L (ref 39–117)
ALT: 7 U/L (ref 0–53)
AST: 20 U/L (ref 0–37)
Albumin: 3.8 g/dL (ref 3.5–5.2)
BILIRUBIN TOTAL: 0.5 mg/dL (ref 0.2–1.2)
BUN: 21 mg/dL (ref 6–23)
CALCIUM: 9.5 mg/dL (ref 8.4–10.5)
CO2: 28 mEq/L (ref 19–32)
Chloride: 106 mEq/L (ref 96–112)
Creatinine, Ser: 1.56 mg/dL — ABNORMAL HIGH (ref 0.40–1.50)
GFR: 44.73 mL/min — AB (ref >60.00)
GLUCOSE: 71 mg/dL (ref 70–99)
POTASSIUM: 3.9 meq/L (ref 3.5–5.1)
Sodium: 141 mEq/L (ref 135–145)
Total Protein: 6.4 g/dL (ref 6.0–8.3)

## 2016-11-08 LAB — LIPID PANEL
CHOLESTEROL: 140 mg/dL (ref 0–200)
HDL: 49.3 mg/dL (ref >39.00)
LDL Cholesterol: 72 mg/dL (ref 0–99)
NONHDL: 90.72
Total CHOL/HDL Ratio: 3
Triglycerides: 94 mg/dL (ref 0.0–149.0)
VLDL: 18.8 mg/dL (ref 0.0–40.0)

## 2016-11-08 LAB — TSH: TSH: 5.1 u[IU]/mL — AB (ref 0.35–4.50)

## 2016-11-08 MED ORDER — ROPINIROLE HCL 1 MG PO TABS: 1.0000 mg | ORAL_TABLET | Freq: Two times a day (BID) | ORAL | 1 refills | Status: DC

## 2016-11-08 NOTE — Progress Notes (Addendum)
Subjective:    Patient ID: Micheal Frank, male    DOB: 09-28-1925, 83 y.o.   MRN: [id number]  DOS:  11/08/2016 Type of visit - description : rov, multiple issues  Interval history: 2nd prolia  injection is pending. Has a skin cancer, prescribed fluorouracil but is having problems with it. Continue taking florastore? Continue taking Wellbutrin? RLS symptoms have increase, has noted involuntary jerking movements of the lower extremities during the daytime, used to be only at night and well controlled with  bedtime Requip. increase Requip dose?. Reports he is extremely sleepy, he fall asleep as soon as he gets inactive during the daytime. Denies feeling tired per se.  Review of Systems No chest pain or difficulty breathing. No lower extremity edema No nausea, vomiting, diarrhea. No blood in the stools. He has occasional cough and sputum production. No anxiety or depression at this time.  Past Medical History:  Diagnosis Date  . Arthritis    left leg also has swelling  . Asthma   . Blood dyscrasia    pt CAN NOT have any blood thinners d/t hx brain bleed  . Chronic kidney disease, stage III (moderate) 01/20/2013  . Complication of anesthesia    confusion with anesthesia  . Constipation    miralax prn  . COPD (chronic obstructive pulmonary disease) (HCC)    emphysema  . Depression    takes wellbutrin bid  . Epidural abscess   . Gastric ulcer   . History of blood clots   . Hyperlipidemia   . Melanoma ([place])   . Peripheral neuropathy   . Pneumonia    as a child and in 1952;had a pneumonia vaccine couple of years ago  . Prosthetic joint infection ([place])   . S/P right heart catheterization    at least 28yrs  . Shingles   . Short-term memory loss   . Shortness of breath    with exertion  . Sleep apnea    has CPAP but doesn't use it;sleep study done at least 37yrs ago  . Staphylococcus aureus bacteremia with sepsis ([place])   . Subdural hematoma ([place]) 03/30/11  . Urinary  frequency    wears depends    Past Surgical History:  Procedure Laterality Date  . epidural abscess drainage  12/2010  . 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 EYE SURGERY     bil cataract surgery  . FILTERING PROCEDURE     filter placed in neck d/t blood  clot  . HERNIA REPAIR     25+yrs ago  . KNEE ARTHROSCOPY    . SKIN BIOPSY     melanoma on head   . TOTAL KNEE ARTHROPLASTY     x 2 left knee 2003/2012  . TOTAL KNEE REVISION  05/23/2011   Procedure: TOTAL KNEE REVISION;  Surgeon: Meredith Pel;  Location: [facility];  Service: Orthopedics;  Laterality: Left;  LEFT KNEE REMOVAL OF SPACER, POSSIBLE REIMPLANTATION OF REVISION TKA VERSES REPLACEMENT ANTIBIOTIC SPACER    Social History   Social History  . Marital status: Married    Spouse name: N/A  . Number of children: 3  . Years of education: N/A   Occupational History  . retired    Social History Main Topics  . Smoking status: Former Smoker    Packs/day: 2.00    Years: 40.00    Types: Cigarettes    Quit date: 07/17/1978  . Smokeless tobacco: Never Used  .  Alcohol use No  . Drug use: No  . Sexual activity: Not on file   Other Topics Concern  . Not on file   Social History Narrative   Lost wife 06-2013   Lives in his house, 1 daughter and her 2 adult children live w/ him (1g-child w/ special needs)         Allergies as of 11/08/2016      Reactions   Anticoagulant Compound    History of bleeding on the brain per daughter   Protonix [pantoprazole Sodium] Diarrhea      Medication List       Accurate as of 11/08/16  9:00 PM. Always use your most recent med list.          acetaminophen 325 MG tablet Commonly known as:  TYLENOL Take 2 tablets (650 mg total) by mouth every 6 (six) hours as needed for mild pain (or Fever >/= 101).   albuterol 108 (90 Base) MCG/ACT inhaler Commonly known as:  PROAIR HFA Inhale 2 puffs into the lungs every 6 (six) hours as needed for  wheezing.   AZO CRANBERRY URINARY TRACT 250-60 MG Caps Generic drug:  Cranberry-Vitamin C Take 1 tablet by mouth daily.   benzonatate 200 MG capsule Commonly known as:  TESSALON TAKE ONE CAPSULE 3-4 TIMES DAILY AS NEEDED FOR COUGH   budesonide-formoterol 160-4.5 MCG/ACT inhaler Commonly known as:  SYMBICORT Inhale 2 puffs into the lungs 2 (two) times daily.   buPROPion 200 MG 12 hr tablet Commonly known as:  WELLBUTRIN SR Take 1 tablet (200 mg total) by mouth daily.   calcium carbonate 600 MG Tabs tablet Commonly known as:  OS-CAL Take 600 mg by mouth daily.   diazepam 5 MG tablet Commonly known as:  VALIUM Take 1 tablet (5 mg total) by mouth every 6 (six) hours as needed for anxiety.   doxazosin 4 MG tablet Commonly known as:  CARDURA Take 1 tablet (4 mg total) by mouth at bedtime.   fluticasone 50 MCG/ACT nasal spray Commonly known as:  FLONASE Place 2 sprays into the nose 2 (two) times daily.   HYDROcodone-acetaminophen 5-325 MG tablet Commonly known as:  NORCO/VICODIN Take 1 tablet every 12 hours as needed for pain.   hydrocortisone 2.5 % cream Apply topically 2 (two) times daily.   meclizine 25 MG tablet Commonly known as:  ANTIVERT Take 1 tablet (25 mg total) by mouth daily as needed (vertigo).   montelukast 10 MG tablet Commonly known as:  SINGULAIR Take 1 tablet (10 mg total) by mouth daily.   multivitamin with minerals Tabs tablet Take 1 tablet by mouth daily.   rOPINIRole 1 MG tablet Commonly known as:  REQUIP Take 1 tablet (1 mg total) by mouth 2 (two) times daily.   saccharomyces boulardii 250 MG capsule Commonly known as:  FLORASTOR Take 1 capsule (250 mg total) by mouth 2 (two) times daily.   simvastatin 40 MG tablet Commonly known as:  ZOCOR Take 1 tablet (40 mg total) by mouth daily.   tamsulosin 0.4 MG Caps capsule Commonly known as:  FLOMAX Take 1 capsule (0.4 mg total) by mouth daily.   triamterene-hydrochlorothiazide 37.5-25 MG  tablet Commonly known as:  MAXZIDE-25 Take 0.5 tablets by mouth daily.   URELLE 81 MG Tabs tablet Take 1 tablet (81 mg total) by mouth every 6 (six) hours as needed for bladder spasms.          Objective:   Physical Exam BP 126/68 (BP Location:  Left Arm, Patient Position: Sitting, Cuff Size: Small)   Pulse 71   Temp 97.5 F (36.4 C) (Oral)   Resp 14   Ht 5\' 10"  (1.778 m)   Wt 165 lb 6 oz (75 kg)   SpO2 97%   BMI 23.73 kg/m  General:   Well developed, well nourished . NAD.  HEENT:  Normocephalic . Face symmetric, atraumatic Lungs:  Decreased breath sounds Normal respiratory effort, no intercostal retractions, no accessory muscle use. Heart: RRR,  + systolic murmur.  No pretibial edema bilaterally  Skin: Not pale. Not jaundice Neurologic:  alert & oriented X3.  Speech normal, gait assisted by a cane. Appropriate for age. Psych--  Cognition and judgment appear intact.  Cooperative with normal attention span and concentration.  Behavior appropriate. No anxious or depressed appearing.      Assessment & Plan:   Assessment   COPD- Dr Melvyn Novas 08-2015, stable, RTC PRN CKD -----Creatinine 1.67, GFR 32 Hyperlipidemia Depression  NEURO: --Decreased memory --Anisocoria- L pupil larger ,s/p surgery --Neuropathy --H/o Mnire's  -- on diuretics , valium, meclizine Osteoporosis -- h/o pelvic Fx, dexa April 2016: T score (-) 2.3;  rx fosamax 03-2015, dc after 3 months d/t dysphagia; prolia #1 05-03-16 DJD  -- hydrocodone rarely RLS OSA dx remotely, used a CPAP, wt loss and cpap d/c ~ 2012 (Dr Maxwell Caul) GU: --H/o incontinence, saw Dr Karsten Ro, OV ~ 2013 --Urinary retention 02-2016: sepsis, admitted, had a foley temporarily  H/o melanoma H/o subdural hematoma, epidural abscess, staph aureus  sepsis H/o Prosthetic joint infection H/o Blood clots H/o gastric ulcer C diff dx 10-19-15, s/p flagyl x 2 , diarrhea again 03-2016, better after vancomycin. Coordinate care with his  daughter [name]  [phone]  PLAN: COPD: Without excessive sxs at this point, takes Tessalon Perles from time to time. No change CKD: Checking labs Hyperlipidemia: On simvastatin, check a CMP and FLP. anxiety and Depression? The patient is on Wellbutrin and would like to stop, he recalls that he was rx more for stopping tobacco than depression perse. We agreed to gradually decrease Wellbutrin SR 200 from bid to 1 po qd, further decrease on RTC. He has Valium for anxiety but has not use in a while. RLS: Sxs increased, increase Requip from 1 a day to one tablet twice a day. Last CBC satisfactory. Check a TSH.  OSA: Reports feeling profoundly sleepy throughout the day. He has a history of a sleep apnea, CPAP was d/c few years ago after he lost weight. He is not tired. We talk about possibly going back on a CPAP but he states he will not  do that thus at this point rec to keep his good sleep habits and observation. Florastor: Taking it long-term after a bout of C. difficile, we agreed to continue taking it. RTC 3 months  Today, I spent more than   45 min with the patient: >50% of the time counseling regards pros and cons of stop Wellbutrin, discussing possibly recheck a sleep study to get a CPAP, he eventually declined. He also has a number of questions and concerns about skin cancer, he will discuss with dermatology. Multiple questions answered to the best of my ability.

## 2016-11-08 NOTE — Assessment & Plan Note (Addendum)
COPD: Without excessive sxs at this point, takes [employer] from time to time. No change CKD: Checking labs Hyperlipidemia: On simvastatin, check a CMP and FLP. anxiety and Depression? The patient is on Wellbutrin and would like to stop, he recalls that he was rx more for stopping tobacco than depression perse. We agreed to gradually decrease Wellbutrin SR 200 from bid to 1 po qd, further decrease on RTC. He has Valium for anxiety but has not use in a while. RLS: Sxs increased, increase Requip from 1 a day to one tablet twice a day. Last CBC satisfactory. Check a TSH.  OSA: Reports feeling profoundly sleepy throughout the day. He has a history of a sleep apnea, CPAP was d/c few years ago after he lost weight. He is not tired. We talk about possibly going back on a CPAP but he states he will not  do that thus at this point rec to keep his good sleep habits and observation. Florastor: Taking it long-term after a bout of C. difficile, we agreed to continue taking it. RTC 3 months

## 2016-11-08 NOTE — Telephone Encounter (Signed)
Tricia-can you order a PROLIA for this Pt? Thank you.

## 2016-11-08 NOTE — Telephone Encounter (Signed)
Prolia is in fridge for pt. 

## 2016-11-08 NOTE — Telephone Encounter (Signed)
Shiquita-will you call Pt's daughter, [name] [phone] to schedule PROLIA injection for Pt at their convenience (nurse visit only). Thank you.

## 2016-11-08 NOTE — Patient Instructions (Signed)
GO TO THE LAB : Get the blood work     GO TO THE FRONT DESK Schedule your next appointment for a  checkup in 3 months  Increase Requip to one tablet twice a day  Decrease Wellbutrin to only one tablet in the morning.  We'll call you with your next PROLIA injection

## 2016-11-08 NOTE — Progress Notes (Signed)
Pre visit review using our clinic review tool, if applicable. No additional management support is needed unless otherwise documented below in the visit note. 

## 2016-11-10 ENCOUNTER — Other Ambulatory Visit: Payer: Self-pay

## 2016-11-10 MED ORDER — TRIAMTERENE-HCTZ 37.5-25 MG PO TABS: 0.5000 | ORAL_TABLET | Freq: Every day | ORAL | 1 refills | Status: DC

## 2016-11-29 ENCOUNTER — Ambulatory Visit (INDEPENDENT_AMBULATORY_CARE_PROVIDER_SITE_OTHER): Payer: Medicare Other

## 2016-11-29 DIAGNOSIS — M818 Other osteoporosis without current pathological fracture: Secondary | ICD-10-CM | POA: Diagnosis not present

## 2016-11-29 MED ORDER — DENOSUMAB 60 MG/ML ~~LOC~~ SOLN
60.0000 mg | Freq: Once | SUBCUTANEOUS | Status: AC
  Administered 2016-11-29: 60 mg via SUBCUTANEOUS

## 2016-11-29 NOTE — Progress Notes (Addendum)
Patient in for Prolia Injection per order from Dr. French Ana. Due to patient having Osteoperosis.  No complaints voiced this visit.   Given 60 mg SQ left arm per patient request. Patient tolerated well.  Patient advised next injection will be scheduled for 6 months. Will receive call from office regarding date. Patient voiced understanding.   Kathlene November, MD

## 2017-01-28 ENCOUNTER — Emergency Department (HOSPITAL_BASED_OUTPATIENT_CLINIC_OR_DEPARTMENT_OTHER): Payer: Medicare Other

## 2017-01-28 ENCOUNTER — Emergency Department (HOSPITAL_BASED_OUTPATIENT_CLINIC_OR_DEPARTMENT_OTHER)
Admission: EM | Admit: 2017-01-28 | Discharge: 2017-01-28 | Disposition: A | Discharge status: Home / Self Care | Payer: Medicare Other | Attending: Emergency Medicine | Admitting: Emergency Medicine

## 2017-01-28 ENCOUNTER — Encounter (HOSPITAL_BASED_OUTPATIENT_CLINIC_OR_DEPARTMENT_OTHER): Payer: Self-pay | Admitting: Emergency Medicine

## 2017-01-28 DIAGNOSIS — J45909 Unspecified asthma, uncomplicated: Secondary | ICD-10-CM | POA: Insufficient documentation

## 2017-01-28 DIAGNOSIS — Z79899 Other long term (current) drug therapy: Secondary | ICD-10-CM | POA: Diagnosis not present

## 2017-01-28 DIAGNOSIS — L03115 Cellulitis of right lower limb: Secondary | ICD-10-CM | POA: Insufficient documentation

## 2017-01-28 DIAGNOSIS — J449 Chronic obstructive pulmonary disease, unspecified: Secondary | ICD-10-CM | POA: Diagnosis not present

## 2017-01-28 DIAGNOSIS — C439 Malignant melanoma of skin, unspecified: Secondary | ICD-10-CM | POA: Insufficient documentation

## 2017-01-28 DIAGNOSIS — Z96652 Presence of left artificial knee joint: Secondary | ICD-10-CM | POA: Insufficient documentation

## 2017-01-28 DIAGNOSIS — R2241 Localized swelling, mass and lump, right lower limb: Secondary | ICD-10-CM | POA: Diagnosis present

## 2017-01-28 DIAGNOSIS — Z87891 Personal history of nicotine dependence: Secondary | ICD-10-CM | POA: Diagnosis not present

## 2017-01-28 LAB — CBC WITH DIFFERENTIAL/PLATELET
BASOS ABS: 0 10*3/uL (ref 0.0–0.1)
BASOS PCT: 0 %
EOS ABS: 0.1 10*3/uL (ref 0.0–0.7)
EOS PCT: 2 %
HCT: 42.5 % (ref 39.0–52.0)
Hemoglobin: 14.1 g/dL (ref 13.0–17.0)
Lymphocytes Relative: 14 %
Lymphs Abs: 1.1 10*3/uL (ref 0.7–4.0)
MCH: 31.5 pg (ref 26.0–34.0)
MCHC: 33.2 g/dL (ref 30.0–36.0)
MCV: 94.9 fL (ref 78.0–100.0)
MONO ABS: 1 10*3/uL (ref 0.1–1.0)
Monocytes Relative: 14 %
NEUTROS ABS: 5.1 10*3/uL (ref 1.7–7.7)
Neutrophils Relative %: 70 %
PLATELETS: 225 10*3/uL (ref 150–400)
RBC: 4.48 MIL/uL (ref 4.22–5.81)
RDW: 14 % (ref 11.5–15.5)
WBC: 7.3 10*3/uL (ref 4.0–10.5)

## 2017-01-28 LAB — BASIC METABOLIC PANEL
ANION GAP: 9 (ref 5–15)
BUN: 18 mg/dL (ref 6–20)
CALCIUM: 8.5 mg/dL — AB (ref 8.9–10.3)
CO2: 25 mmol/L (ref 22–32)
Chloride: 105 mmol/L (ref 101–111)
Creatinine, Ser: 1.57 mg/dL — ABNORMAL HIGH (ref 0.61–1.24)
GFR calc Af Amer: 43 mL/min — ABNORMAL LOW (ref >60)
GFR, EST NON AFRICAN AMERICAN: 37 mL/min — AB (ref >60)
GLUCOSE: 79 mg/dL (ref 65–99)
Potassium: 3.7 mmol/L (ref 3.5–5.1)
Sodium: 139 mmol/L (ref 135–145)

## 2017-01-28 MED ORDER — CLINDAMYCIN PHOSPHATE 600 MG/50ML IV SOLN
600.0000 mg | Freq: Once | INTRAVENOUS | Status: AC
  Administered 2017-01-28: 600 mg via INTRAVENOUS
  Filled 2017-01-28: qty 50

## 2017-01-28 MED ORDER — CLINDAMYCIN HCL 300 MG PO CAPS: 300.0000 mg | ORAL_CAPSULE | Freq: Four times a day (QID) | ORAL | 0 refills | Status: DC

## 2017-01-28 NOTE — ED Triage Notes (Addendum)
Pt presents with redness and swelling to right lower leg and foot that he noticed today. Pt reports his great toe on right foot has been sore for 2 days but foot started getting red and swollen about 3 hours ago.  Skin tear to right lateral knee noted also. Pt states he had a sewage leak and was walking in it last week with tennis shoes on and now thinks the redness in rt foot could be from that.

## 2017-01-28 NOTE — ED Notes (Signed)
ED Provider at bedside. 

## 2017-01-28 NOTE — ED Provider Notes (Signed)
[facility] DEPT [facility] Provider Note   CSN: [id number] Arrival date & time: 01/28/17  1310     History   Chief Complaint Chief Complaint  Patient presents with  . Leg Swelling    HPI Micheal Frank is a 83 y.o. male.  Patient is an 83 year old male with a history of COPD, prior subdural hematoma and C. difficile who presents with right leg redness and swelling. He states that 2 days ago he noticed some pain in his right big toe. Today he noticed that his foot was swollen with swelling extending to his ankle. About a week ago he had a skin tear to his right upper leg which has been healing. There is no drainage from the site. He denies any fevers. No nausea or vomiting. No cough or chest congestion other than his baseline cough related to his COPD. He denies any shortness of breath or chest pain. No history of gout or other joint issues      Past Medical History:  Diagnosis Date  . Arthritis    left leg also has swelling  . Asthma   . Blood dyscrasia    pt CAN NOT have any blood thinners d/t hx brain bleed  . Chronic kidney disease, stage III (moderate) 01/20/2013  . Complication of anesthesia    confusion with anesthesia  . Constipation    miralax prn  . COPD (chronic obstructive pulmonary disease) (HCC)    emphysema  . Depression    takes wellbutrin bid  . Epidural abscess   . Gastric ulcer   . History of blood clots   . Hyperlipidemia   . Melanoma ([place])   . Peripheral neuropathy   . Pneumonia    as a child and in 1952;had a pneumonia vaccine couple of years ago  . Prosthetic joint infection ([place])   . S/P right heart catheterization    at least 65yrs  . Shingles   . Short-term memory loss   . Shortness of breath    with exertion  . Sleep apnea    has CPAP but doesn't use it;sleep study done at least 26yrs ago  . Staphylococcus aureus bacteremia with sepsis ([place])   . Subdural hematoma ([place]) 03/30/11  . Urinary frequency    wears depends    Patient Active  Problem List   Diagnosis Date Noted  . Presence of left artificial knee joint 07/24/2016  . Chronic pain of left knee 07/24/2016  . Chronic midline low back pain without sciatica 07/24/2016  . Depression 07/09/2016  . C. difficile diarrhea 05/04/2016  . Chronic cough 08/16/2015  . PCP NOTES >>>>>> 04/06/2015  . CTS (carpal tunnel syndrome) 11/16/2014  . Hyperlipidemia 08/17/2014  . Superficial bruising of chest wall 07/29/2014  . Urgency incontinence 01/23/2013  . Chronic kidney disease, stage III (moderate) 01/20/2013  . At high risk for falls 11/28/2012  . ALLERGIC RHINITIS 12/09/2009  . Carcinoma in situ of skin 04/30/2008  . RESTLESS LEG SYNDROME 04/30/2008  . NEUROPATHY 04/30/2008  . GLAUCOMA, BORDERLINE 04/30/2008  . Meniere's disease 04/30/2008  . HEARING LOSS 04/30/2008  . COPD GOLD II  04/30/2008  . DJD (degenerative joint disease) 04/30/2008  . Osteoporosis 04/30/2008  . Sleep apnea 04/30/2008  . HISTORY OF ASBESTOS EXPOSURE 04/30/2008    Past Surgical History:  Procedure Laterality Date  . epidural abscess drainage  12/2010  . 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 EYE SURGERY  bil cataract surgery  . FILTERING PROCEDURE     filter placed in neck d/t blood  clot  . HERNIA REPAIR     25+yrs ago  . KNEE ARTHROSCOPY    . SKIN BIOPSY     melanoma on head   . TOTAL KNEE ARTHROPLASTY     x 2 left knee 2003/2012  . TOTAL KNEE REVISION  05/23/2011   Procedure: TOTAL KNEE REVISION;  Surgeon: Meredith Pel;  Location: [place];  Service: Orthopedics;  Laterality: Left;  LEFT KNEE REMOVAL OF SPACER, POSSIBLE REIMPLANTATION OF REVISION TKA VERSES REPLACEMENT ANTIBIOTIC SPACER       Home Medications    Prior to Admission medications   Medication Sig Start Date End Date Taking? Authorizing Provider  acetaminophen (TYLENOL) 325 MG tablet Take 2 tablets (650 mg total) by mouth every 6 (six) hours as needed for mild pain (or Fever >/=  101). 02/22/16   Regalado, Belkys A, MD  albuterol (PROAIR HFA) 108 (90 BASE) MCG/ACT inhaler Inhale 2 puffs into the lungs every 6 (six) hours as needed for wheezing. 07/31/14   Elsie Stain, MD  benzonatate (TESSALON) 200 MG capsule TAKE ONE CAPSULE 3-4 TIMES DAILY AS NEEDED FOR COUGH Patient taking differently: Take 200 mg by mouth 2 (two) times daily as needed for cough. TAKE ONE CAPSULE 3-4 TIMES DAILY AS NEEDED FOR COUGH 08/16/15   Tanda Rockers, MD  budesonide-formoterol [facility]) 160-4.5 MCG/ACT inhaler Inhale 2 puffs into the lungs 2 (two) times daily.    [provider]  buPROPion (WELLBUTRIN SR) 200 MG 12 hr tablet Take 1 tablet (200 mg total) by mouth daily. 11/08/16   Colon Branch, MD  calcium carbonate (OS-CAL) 600 MG TABS Take 600 mg by mouth daily.     [provider]  clindamycin (CLEOCIN) 300 MG capsule Take 1 capsule (300 mg total) by mouth 4 (four) times daily. X 7 days 01/28/17   Malvin Johns, MD  Cranberry-Vitamin C (AZO CRANBERRY URINARY TRACT) 250-60 MG CAPS Take 1 tablet by mouth daily.    [provider]  diazepam (VALIUM) 5 MG tablet Take 1 tablet (5 mg total) by mouth every 6 (six) hours as needed for anxiety. 03/01/16   Colon Branch, MD  doxazosin (CARDURA) 4 MG tablet Take 1 tablet (4 mg total) by mouth at bedtime. 11/06/16   Colon Branch, MD  fluticasone [facility]) 50 MCG/ACT nasal spray Place 2 sprays into the nose 2 (two) times daily. 08/08/12   Elsie Stain, MD  HYDROcodone-acetaminophen (NORCO/VICODIN) 5-325 MG tablet Take 1 tablet every 12 hours as needed for pain. 07/25/16   Leandrew Koyanagi, MD  hydrocortisone 2.5 % cream Apply topically 2 (two) times daily. 03/01/16 03/01/17  Colon Branch, MD  meclizine (ANTIVERT) 25 MG tablet Take 1 tablet (25 mg total) by mouth daily as needed (vertigo). 03/01/16   Colon Branch, MD  montelukast (SINGULAIR) 10 MG tablet Take 1 tablet (10 mg total) by mouth daily. 09/20/16   Colon Branch, MD  Multiple Vitamin  (MULTIVITAMIN WITH MINERALS) TABS Take 1 tablet by mouth daily.    [provider]  rOPINIRole (REQUIP) 1 MG tablet Take 1 tablet (1 mg total) by mouth 2 (two) times daily. 11/08/16   Colon Branch, MD  saccharomyces boulardii (FLORASTOR) 250 MG capsule Take 1 capsule (250 mg total) by mouth 2 (two) times daily. 03/09/16   Colon Branch, MD  simvastatin (ZOCOR) 40 MG  tablet Take 1 tablet (40 mg total) by mouth daily. 09/20/16   Colon Branch, MD  tamsulosin (FLOMAX) 0.4 MG CAPS capsule Take 1 capsule (0.4 mg total) by mouth daily. 09/20/16   Colon Branch, MD  triamterene-hydrochlorothiazide (MAXZIDE-25) 37.5-25 MG tablet Take 0.5 tablets by mouth daily. 11/10/16   Colon Branch, MD  URELLE (URELLE/URISED) 81 MG TABS tablet Take 1 tablet (81 mg total) by mouth every 6 (six) hours as needed for bladder spasms. Patient not taking: Reported on 11/08/2016 02/22/16   Elmarie Shiley, MD    Family History Family History  Problem Relation Age of Onset  . Melanoma Brother   . CAD Neg Hx   . Diabetes Neg Hx     Social History Social History  Substance Use Topics  . Smoking status: Former Smoker    Packs/day: 2.00    Years: 40.00    Types: Cigarettes    Quit date: 07/17/1978  . Smokeless tobacco: Never Used  . Alcohol use No     Allergies   Anticoagulant compound and Protonix [pantoprazole sodium]   Review of Systems Review of Systems  Constitutional: Negative for chills, diaphoresis, fatigue and fever.  HENT: Negative for congestion, rhinorrhea and sneezing.   Eyes: Negative.   Respiratory: Negative for cough, chest tightness and shortness of breath.   Cardiovascular: Positive for leg swelling. Negative for chest pain.  Gastrointestinal: Negative for abdominal pain, blood in stool, diarrhea, nausea and vomiting.  Genitourinary: Negative for difficulty urinating, flank pain, frequency and hematuria.  Musculoskeletal: Negative for arthralgias and back pain.  Skin: Positive for wound. Negative  for rash.  Neurological: Negative for dizziness, speech difficulty, weakness, numbness and headaches.     Physical Exam Updated Vital Signs BP (!) 145/75 (BP Location: Left Arm)   Pulse 73   Temp 97.9 F (36.6 C) (Oral)   Resp 20   Ht 5\' 9"  (1.753 m)   Wt 69.4 kg (153 lb)   SpO2 95%   BMI 22.59 kg/m   Physical Exam  Constitutional: He is oriented to person, place, and time. He appears well-developed and well-nourished.  HENT:  Head: Normocephalic and atraumatic.  Eyes: Pupils are equal, round, and reactive to light.  Neck: Normal range of motion. Neck supple.  Cardiovascular: Normal rate, regular rhythm and normal heart sounds.   Pulmonary/Chest: Effort normal and breath sounds normal. No respiratory distress. He has no wheezes. He has no rales. He exhibits no tenderness.  Abdominal: Soft. Bowel sounds are normal. There is no tenderness. There is no rebound and no guarding.  Musculoskeletal: Normal range of motion. He exhibits edema.  Patient has swelling of the dorsum of the right foot and ankle. There is erythema to the dorsum of the foot. There is no wounds noted. He does have evidence of onychomycosis. There some mild swelling of the right calf as well. There is a healing skin tear overlying the right proximal fibula but no surrounding erythema is noted. No drainage is noted.  Lymphadenopathy:    He has no cervical adenopathy.  Neurological: He is alert and oriented to person, place, and time.  Skin: Skin is warm and dry. No rash noted.  Psychiatric: He has a normal mood and affect.     ED Treatments / Results  Labs (all labs ordered are listed, but only abnormal results are displayed) Labs Reviewed  BASIC METABOLIC PANEL - Abnormal; Notable for the following:       Result Value  Creatinine, Ser 1.57 (*)    Calcium 8.5 (*)    GFR calc non Af Amer 37 (*)    GFR calc Af Amer 43 (*)    All other components within normal limits  CBC WITH DIFFERENTIAL/PLATELET     EKG  EKG Interpretation None       Radiology Dg Ankle Complete Right  Result Date: 01/28/2017 CLINICAL DATA:  Right foot swelling for 1 day. The patient cut his leg on a lawnmower 1 week ago. EXAM: RIGHT ANKLE - COMPLETE 3+ VIEW COMPARISON:  Right foot radiographs obtained at the same time. FINDINGS: Diffuse soft tissue swelling, most pronounced laterally. Small calcaneal spurs and adjacent soft tissue calcifications. No fracture, dislocation, effusion, bone destruction, periosteal reaction or soft tissue gas. IMPRESSION: Soft tissue swelling without acute underlying bony abnormality. Electronically Signed   By: Claudie Revering M.D.   On: 01/28/2017 14:35   US Venous Img Lower Unilateral Right  Result Date: 01/28/2017 CLINICAL DATA:  Right foot and ankle pain, redness and swelling acutely today EXAM: RIGHT LOWER EXTREMITY VENOUS DOPPLER ULTRASOUND TECHNIQUE: Gray-scale sonography with graded compression, as well as color Doppler and duplex ultrasound were performed to evaluate the lower extremity deep venous systems from the level of the common femoral vein and including the common femoral, femoral, profunda femoral, popliteal and calf veins including the posterior tibial, peroneal and gastrocnemius veins when visible. The superficial great saphenous vein was also interrogated. Spectral Doppler was utilized to evaluate flow at rest and with distal augmentation maneuvers in the common femoral, femoral and popliteal veins. COMPARISON:  None. FINDINGS: Contralateral Common Femoral Vein: Respiratory phasicity is normal and symmetric with the symptomatic side. No evidence of thrombus. Normal compressibility. Common Femoral Vein: No evidence of thrombus. Normal compressibility, respiratory phasicity and response to augmentation. Saphenofemoral Junction: No evidence of thrombus. Normal compressibility and flow on color Doppler imaging. Profunda Femoral Vein: No evidence of thrombus. Normal compressibility  and flow on color Doppler imaging. Femoral Vein: No evidence of thrombus. Normal compressibility, respiratory phasicity and response to augmentation. Popliteal Vein: No evidence of thrombus. Normal compressibility, respiratory phasicity and response to augmentation. Calf Veins: No evidence of thrombus. Normal compressibility and flow on color Doppler imaging. Superficial Great Saphenous Vein: No evidence of thrombus. Normal compressibility and flow on color Doppler imaging. Venous Reflux:  None. Other Findings:  None. IMPRESSION: No evidence of DVT within the right lower extremity. Electronically Signed   By: Jerilynn Mages.  Shick M.D.   On: 01/28/2017 14:39   Dg Foot Complete Right  Result Date: 01/28/2017 CLINICAL DATA:  Right foot swelling for 1 day. The patient cut his leg on a lawnmower 1 week ago. EXAM: RIGHT FOOT COMPLETE - 3+ VIEW COMPARISON:  Right ankle obtained today. FINDINGS: Mild distal soft tissue swelling. Small calcaneal spurs. No fracture, dislocation, bone destruction, periosteal reaction or soft tissue gas. IMPRESSION: Distal soft tissue swelling without underlying bony abnormality. Electronically Signed   By: Claudie Revering M.D.   On: 01/28/2017 14:33    Procedures Procedures (including critical care time)  Medications Ordered in ED Medications  clindamycin (CLEOCIN) IVPB 600 mg (600 mg Intravenous New Bag/Given 01/28/17 1348)     Initial Impression / Assessment and Plan / ED Course  I have reviewed the triage vital signs and the nursing notes.  Pertinent labs & imaging results that were available during my care of the patient were reviewed by me and considered in my medical decision making (see chart for details).  Patient has redness to the right foot was swelling of the foot and ankle. The redness does not extend past the mid foot. There is no fever or other systemic signs of illness. His white count is normal. There is no evidence of bony injury. No evidence of DVT. He was given  an IV dose of clindamycin in the ED and I will give him prescription for clindamycin. I feel that he can be treated as an outpatient at this point. I did encourage him and his daughter to make a follow-up appointment with Dr. Larose Kells tomorrow for a recheck. The red edges were marked. Return precautions were given. He was advised to return if he has any redness extending past the marked edges or any fever or worsening symptoms.  Final Clinical Impressions(s) / ED Diagnoses   Final diagnoses:  Cellulitis of right lower extremity    New Prescriptions New Prescriptions   CLINDAMYCIN (CLEOCIN) 300 MG CAPSULE    Take 1 capsule (300 mg total) by mouth 4 (four) times daily. X 7 days     Malvin Johns, MD 01/28/17 1450

## 2017-01-28 NOTE — ED Notes (Signed)
Patient transported to X-ray 

## 2017-01-28 NOTE — ED Notes (Signed)
Delay in xray/ [place]. RN wanted pt to finish antibiotics

## 2017-01-28 NOTE — ED Notes (Signed)
Pt discharged to home with family. NAD.  

## 2017-01-29 ENCOUNTER — Ambulatory Visit (INDEPENDENT_AMBULATORY_CARE_PROVIDER_SITE_OTHER): Payer: Medicare Other | Admitting: Internal Medicine

## 2017-01-29 ENCOUNTER — Encounter: Payer: Self-pay | Admitting: Internal Medicine

## 2017-01-29 VITALS — BP 126/78 | HR 76 | Temp 97.9°F | Resp 14 | Ht 70.0 in | Wt 155.4 lb

## 2017-01-29 DIAGNOSIS — L03119 Cellulitis of unspecified part of limb: Secondary | ICD-10-CM

## 2017-01-29 DIAGNOSIS — F329 Major depressive disorder, single episode, unspecified: Secondary | ICD-10-CM

## 2017-01-29 DIAGNOSIS — F32A Depression, unspecified: Secondary | ICD-10-CM

## 2017-01-29 NOTE — Progress Notes (Signed)
Pre visit review using our clinic review tool, if applicable. No additional management support is needed unless otherwise documented below in the visit note. 

## 2017-01-29 NOTE — Patient Instructions (Signed)
Continue the antibiotics  Keep the leg elevated  Call if you are not continue improving or if you have fever, chills, diarrhea.

## 2017-01-29 NOTE — Progress Notes (Signed)
Subjective:    Patient ID: Micheal Frank, male    DOB: Apr 14, 1926, 83 y.o.   MRN: [id number]  DOS:  01/29/2017 Type of visit - description : ER follow-up Interval history: Went to the ER yesterday with R foot swelling and redness. Exam was confirmatory, no wounds. White count not elevated, BMP at baseline, ultrasound no DVT. Was Rx clindamycin IV 1 and given a prescription.  Review of Systems Since he left the ER yesterday he is taking the medications correctly. Food looks better. No fever chills. No nausea, vomiting or diarrhea.   Past Medical History:  Diagnosis Date  . Arthritis    left leg also has swelling  . Asthma   . Blood dyscrasia    pt CAN NOT have any blood thinners d/t hx brain bleed  . Chronic kidney disease, stage III (moderate) 01/20/2013  . Complication of anesthesia    confusion with anesthesia  . Constipation    miralax prn  . COPD (chronic obstructive pulmonary disease) (HCC)    emphysema  . Depression    takes wellbutrin bid  . Epidural abscess   . Gastric ulcer   . History of blood clots   . Hyperlipidemia   . Melanoma ([place])   . Peripheral neuropathy   . Pneumonia    as a child and in 1952;had a pneumonia vaccine couple of years ago  . Prosthetic joint infection ([place])   . S/P right heart catheterization    at least 71yrs  . Shingles   . Short-term memory loss   . Shortness of breath    with exertion  . Sleep apnea    has CPAP but doesn't use it;sleep study done at least 4yrs ago  . Staphylococcus aureus bacteremia with sepsis ([place])   . Subdural hematoma ([place]) 03/30/11  . Urinary frequency    wears depends    Past Surgical History:  Procedure Laterality Date  . epidural abscess drainage  12/2010  . 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 EYE SURGERY     bil cataract surgery  . FILTERING PROCEDURE     filter placed in neck d/t blood  clot  . HERNIA REPAIR     25+yrs ago  . KNEE ARTHROSCOPY    . SKIN  BIOPSY     melanoma on head   . TOTAL KNEE ARTHROPLASTY     x 2 left knee 2003/2012  . TOTAL KNEE REVISION  05/23/2011   Procedure: TOTAL KNEE REVISION;  Surgeon: Meredith Pel;  Location: [place];  Service: Orthopedics;  Laterality: Left;  LEFT KNEE REMOVAL OF SPACER, POSSIBLE REIMPLANTATION OF REVISION TKA VERSES REPLACEMENT ANTIBIOTIC SPACER    Social History   Social History  . Marital status: Widowed    Spouse name: N/A  . Number of children: 3  . Years of education: N/A   Occupational History  . retired    Social History Main Topics  . Smoking status: Former Smoker    Packs/day: 2.00    Years: 40.00    Types: Cigarettes    Quit date: 07/17/1978  . Smokeless tobacco: Never Used  . Alcohol use No  . Drug use: No  . Sexual activity: Not on file   Other Topics Concern  . Not on file   Social History Narrative   Lost wife 06-2013   Lives in his house, 1 daughter and her 2 adult children live w/ him (1g-child  w/ special needs)         Allergies as of 01/29/2017      Reactions   Anticoagulant Compound    History of bleeding on the brain per daughter   Protonix [pantoprazole Sodium] Diarrhea      Medication List       Accurate as of 01/29/17 11:59 PM. Always use your most recent med list.          acetaminophen 325 MG tablet Commonly known as:  TYLENOL Take 2 tablets (650 mg total) by mouth every 6 (six) hours as needed for mild pain (or Fever >/= 101).   albuterol 108 (90 Base) MCG/ACT inhaler Commonly known as:  PROAIR HFA Inhale 2 puffs into the lungs every 6 (six) hours as needed for wheezing.   AZO CRANBERRY URINARY TRACT 250-60 MG Caps Generic drug:  Cranberry-Vitamin C Take 1 tablet by mouth daily.   benzonatate 200 MG capsule Commonly known as:  TESSALON TAKE ONE CAPSULE 3-4 TIMES DAILY AS NEEDED FOR COUGH   budesonide-formoterol 160-4.5 MCG/ACT inhaler Commonly known as:  SYMBICORT Inhale 2 puffs into the lungs 2 (two) times daily.     buPROPion 200 MG 12 hr tablet Commonly known as:  WELLBUTRIN SR Take 200 mg by mouth 2 (two) times daily.   calcium carbonate 600 MG Tabs tablet Commonly known as:  OS-CAL Take 600 mg by mouth daily.   clindamycin 300 MG capsule Commonly known as:  CLEOCIN Take 1 capsule (300 mg total) by mouth 4 (four) times daily. X 7 days   diazepam 5 MG tablet Commonly known as:  VALIUM Take 1 tablet (5 mg total) by mouth every 6 (six) hours as needed for anxiety.   doxazosin 4 MG tablet Commonly known as:  CARDURA Take 1 tablet (4 mg total) by mouth at bedtime.   fluticasone 50 MCG/ACT nasal spray Commonly known as:  FLONASE Place 2 sprays into the nose 2 (two) times daily.   HYDROcodone-acetaminophen 5-325 MG tablet Commonly known as:  NORCO/VICODIN Take 1 tablet every 12 hours as needed for pain.   hydrocortisone 2.5 % cream Apply topically 2 (two) times daily.   meclizine 25 MG tablet Commonly known as:  ANTIVERT Take 1 tablet (25 mg total) by mouth daily as needed (vertigo).   montelukast 10 MG tablet Commonly known as:  SINGULAIR Take 1 tablet (10 mg total) by mouth daily.   multivitamin with minerals Tabs tablet Take 1 tablet by mouth daily.   rOPINIRole 1 MG tablet Commonly known as:  REQUIP Take 1 tablet (1 mg total) by mouth 2 (two) times daily.   saccharomyces boulardii 250 MG capsule Commonly known as:  FLORASTOR Take 1 capsule (250 mg total) by mouth 2 (two) times daily.   simvastatin 40 MG tablet Commonly known as:  ZOCOR Take 1 tablet (40 mg total) by mouth daily.   tamsulosin 0.4 MG Caps capsule Commonly known as:  FLOMAX Take 1 capsule (0.4 mg total) by mouth daily.   triamterene-hydrochlorothiazide 37.5-25 MG tablet Commonly known as:  MAXZIDE-25 Take 0.5 tablets by mouth daily.   URELLE 81 MG Tabs tablet Take 1 tablet (81 mg total) by mouth every 6 (six) hours as needed for bladder spasms.          Objective:   Physical Exam BP 126/78 (BP  Location: Left Arm, Patient Position: Sitting, Cuff Size: Small)   Pulse 76   Temp 97.9 F (36.6 C) (Oral)   Resp 14  Ht 5\' 10"  (1.778 m)   Wt 155 lb 6 oz (70.5 kg)   SpO2 95%   BMI 22.29 kg/m  General:   Well developed, well nourished . NAD.  HEENT:  Normocephalic . Face symmetric, atraumatic Right lower extremity: Good pedal pulse, swelling has definitely decrease  (the patient's daughter showed me a picture of the foot from yesterday); has mild persistent redness but no tenderness to palpation.  Neurologic:  alert & oriented X3.  Speech normal, gaitassisted by a cane and at baseline  Psych--  Cognition and judgment appear intact.  Cooperative with normal attention span and concentration.  Behavior appropriate. No anxious or depressed appearing.      Assessment & Plan:  Assessment   COPD- Dr Melvyn Novas 08-2015, stable, RTC PRN CKD -----Creatinine 1.67, GFR 32 Hyperlipidemia Depression  NEURO: --Decreased memory --Anisocoria- L pupil larger ,s/p surgery --Neuropathy --H/o Mnire's  -- on diuretics , valium, meclizine Osteoporosis -- h/o pelvic Fx, dexa April 2016: T score (-) 2.3;  rx fosamax 03-2015, dc after 3 months d/t dysphagia; prolia #1 05-03-16 DJD  -- hydrocodone rarely RLS OSA dx remotely, used a CPAP, wt loss and cpap d/c ~ 2012 (Dr Maxwell Caul) GU: --H/o incontinence, saw Dr Karsten Ro, OV ~ 2013 --Urinary retention 02-2016: sepsis, admitted, had a foley temporarily  H/o melanoma H/o subdural hematoma, epidural abscess, staph aureus  sepsis H/o Prosthetic joint infection H/o Blood clots H/o gastric ulcer C diff dx 10-19-15, s/p flagyl x 2 , diarrhea again 03-2016, better after vancomycin. Coordinate care with his daughter [name]  [phone]  PLAN: Cellulitis: Improved, continue abx po. he will come back in few days, will recheck the cellulitis then. Anxiety depression? After a long conversation the last time he was here he eventually decided to stay on Wellbutrin SR  200 mg twice a day. Med list edited. Pt will keep his follow-up in few days

## 2017-01-30 NOTE — Assessment & Plan Note (Signed)
Cellulitis: Improved, continue abx po. he will come back in few days, will recheck the cellulitis then. Anxiety depression? After a long conversation the last time he was here he eventually decided to stay on Wellbutrin SR 200 mg twice a day. Med list edited. Pt will keep his follow-up in few days

## 2017-02-05 ENCOUNTER — Encounter: Payer: Self-pay | Admitting: Internal Medicine

## 2017-02-05 ENCOUNTER — Ambulatory Visit (INDEPENDENT_AMBULATORY_CARE_PROVIDER_SITE_OTHER): Payer: Medicare Other | Admitting: Internal Medicine

## 2017-02-05 VITALS — BP 142/61 | HR 73 | Temp 97.6°F | Ht 70.0 in | Wt 155.2 lb

## 2017-02-05 DIAGNOSIS — L03119 Cellulitis of unspecified part of limb: Secondary | ICD-10-CM | POA: Diagnosis not present

## 2017-02-05 DIAGNOSIS — G2581 Restless legs syndrome: Secondary | ICD-10-CM

## 2017-02-05 NOTE — Progress Notes (Signed)
Pre visit review using our clinic review tool, if applicable. No additional management support is needed unless otherwise documented below in the visit note. 

## 2017-02-05 NOTE — Progress Notes (Signed)
Subjective:    Patient ID: Micheal Frank, male    DOB: December 26, 1925, 83 y.o.   MRN: [id number]  DOS:  02/05/2017 Type of visit - description : f/u  Interval history: Cellulitis: He finished antibiotics and feels better. Redness decrease, no pain. RLS: Good compliance of medication, no apparent side effects.   Review of Systems  denies nausea, vomiting, diarrhea.  No fever or chills  Past Medical History:  Diagnosis Date  . Arthritis    left leg also has swelling  . Asthma   . Blood dyscrasia    pt CAN NOT have any blood thinners d/t hx brain bleed  . Chronic kidney disease, stage III (moderate) 01/20/2013  . Complication of anesthesia    confusion with anesthesia  . Constipation    miralax prn  . COPD (chronic obstructive pulmonary disease) (HCC)    emphysema  . Depression    takes wellbutrin bid  . Epidural abscess   . Gastric ulcer   . History of blood clots   . Hyperlipidemia   . Melanoma ([place])   . Peripheral neuropathy   . Pneumonia    as a child and in 1952;had a pneumonia vaccine couple of years ago  . Prosthetic joint infection ([place])   . S/P right heart catheterization    at least 24yrs  . Shingles   . Short-term memory loss   . Shortness of breath    with exertion  . Sleep apnea    has CPAP but doesn't use it;sleep study done at least 58yrs ago  . Staphylococcus aureus bacteremia with sepsis ([place])   . Subdural hematoma ([place]) 03/30/11  . Urinary frequency    wears depends    Past Surgical History:  Procedure Laterality Date  . epidural abscess drainage  12/2010  . 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 EYE SURGERY     bil cataract surgery  . FILTERING PROCEDURE     filter placed in neck d/t blood  clot  . HERNIA REPAIR     25+yrs ago  . KNEE ARTHROSCOPY    . SKIN BIOPSY     melanoma on head   . TOTAL KNEE ARTHROPLASTY     x 2 left knee 2003/2012  . TOTAL KNEE REVISION  05/23/2011   Procedure: TOTAL KNEE REVISION;   Surgeon: Meredith Pel;  Location: [facility];  Service: Orthopedics;  Laterality: Left;  LEFT KNEE REMOVAL OF SPACER, POSSIBLE REIMPLANTATION OF REVISION TKA VERSES REPLACEMENT ANTIBIOTIC SPACER    Social History   Social History  . Marital status: Widowed    Spouse name: N/A  . Number of children: 3  . Years of education: N/A   Occupational History  . retired    Social History Main Topics  . Smoking status: Former Smoker    Packs/day: 2.00    Years: 40.00    Types: Cigarettes    Quit date: 07/17/1978  . Smokeless tobacco: Never Used  . Alcohol use No  . Drug use: No  . Sexual activity: Not on file   Other Topics Concern  . Not on file   Social History Narrative   Lost wife 06-2013   Lives in his house, 1 daughter and her 2 adult children live w/ him (1g-child w/ special needs)         Allergies as of 02/05/2017      Reactions   Anticoagulant Compound  History of bleeding on the brain per daughter   Protonix [pantoprazole Sodium] Diarrhea      Medication List       Accurate as of 02/05/17  5:16 PM. Always use your most recent med list.          acetaminophen 325 MG tablet Commonly known as:  TYLENOL Take 2 tablets (650 mg total) by mouth every 6 (six) hours as needed for mild pain (or Fever >/= 101).   albuterol 108 (90 Base) MCG/ACT inhaler Commonly known as:  PROAIR HFA Inhale 2 puffs into the lungs every 6 (six) hours as needed for wheezing.   AZO CRANBERRY URINARY TRACT 250-60 MG Caps Generic drug:  Cranberry-Vitamin C Take 1 tablet by mouth daily.   benzonatate 200 MG capsule Commonly known as:  TESSALON TAKE ONE CAPSULE 3-4 TIMES DAILY AS NEEDED FOR COUGH   budesonide-formoterol 160-4.5 MCG/ACT inhaler Commonly known as:  SYMBICORT Inhale 2 puffs into the lungs 2 (two) times daily.   buPROPion 200 MG 12 hr tablet Commonly known as:  WELLBUTRIN SR Take 200 mg by mouth 2 (two) times daily.   calcium carbonate 600 MG Tabs tablet Commonly  known as:  OS-CAL Take 600 mg by mouth daily.   clindamycin 300 MG capsule Commonly known as:  CLEOCIN Take 1 capsule (300 mg total) by mouth 4 (four) times daily. X 7 days   diazepam 5 MG tablet Commonly known as:  VALIUM Take 1 tablet (5 mg total) by mouth every 6 (six) hours as needed for anxiety.   doxazosin 4 MG tablet Commonly known as:  CARDURA Take 1 tablet (4 mg total) by mouth at bedtime.   fluticasone 50 MCG/ACT nasal spray Commonly known as:  FLONASE Place 2 sprays into the nose 2 (two) times daily.   HYDROcodone-acetaminophen 5-325 MG tablet Commonly known as:  NORCO/VICODIN Take 1 tablet every 12 hours as needed for pain.   hydrocortisone 2.5 % cream Apply topically 2 (two) times daily.   meclizine 25 MG tablet Commonly known as:  ANTIVERT Take 1 tablet (25 mg total) by mouth daily as needed (vertigo).   montelukast 10 MG tablet Commonly known as:  SINGULAIR Take 1 tablet (10 mg total) by mouth daily.   multivitamin with minerals Tabs tablet Take 1 tablet by mouth daily.   rOPINIRole 1 MG tablet Commonly known as:  REQUIP Take 1 tablet (1 mg total) by mouth 2 (two) times daily.   saccharomyces boulardii 250 MG capsule Commonly known as:  FLORASTOR Take 1 capsule (250 mg total) by mouth 2 (two) times daily.   simvastatin 40 MG tablet Commonly known as:  ZOCOR Take 1 tablet (40 mg total) by mouth daily.   tamsulosin 0.4 MG Caps capsule Commonly known as:  FLOMAX Take 1 capsule (0.4 mg total) by mouth daily.   triamterene-hydrochlorothiazide 37.5-25 MG tablet Commonly known as:  MAXZIDE-25 Take 0.5 tablets by mouth daily.   URELLE 81 MG Tabs tablet Take 1 tablet (81 mg total) by mouth every 6 (six) hours as needed for bladder spasms.          Objective:   Physical Exam BP (!) 142/61 (BP Location: Left Arm, Patient Position: Sitting, Cuff Size: Small)   Pulse 73   Temp 97.6 F (36.4 C) (Oral)   Ht 5\' 10"  (1.778 m)   Wt 155 lb 4 oz (70.4  kg)   SpO2 96%   BMI 22.28 kg/m  General:   Well developed, well nourished .  NAD.  HEENT:  Normocephalic . Face symmetric, atraumatic Lungs:  CTA B Normal respiratory effort, no intercostal retractions, no accessory muscle use. Heart: RRR,  + systolic murmur.  No pretibial edema bilaterally. Redness, right foot essentially gone, no TTP. Good pedal pulses bilaterally.  Skin: Not pale. Not jaundice Neurologic:  alert & oriented X3.  Speech normal Psych--  Cognition and judgment appear intact.  Cooperative with normal attention span and concentration.  Behavior appropriate. No anxious or depressed appearing.      Assessment & Plan:   Assessment   COPD- Dr Melvyn Novas 08-2015, stable, RTC PRN CKD -----Creatinine 1.67, GFR 32 Hyperlipidemia Depression  NEURO: --Decreased memory --Anisocoria- L pupil larger ,s/p surgery --Neuropathy --H/o Mnire's  -- on diuretics , valium, meclizine Osteoporosis -- h/o pelvic Fx, dexa April 2016: T score (-) 2.3;  rx fosamax 03-2015, dc after 3 months d/t dysphagia; prolia #1 05-03-16 DJD  -- hydrocodone rarely RLS OSA dx remotely, used a CPAP, wt loss and cpap d/c ~ 2012 (Dr Maxwell Caul) GU: --H/o incontinence, saw Dr Karsten Ro, OV ~ 2013 --Urinary retention 02-2016: sepsis, admitted, had a foley temporarily  H/o melanoma H/o subdural hematoma, epidural abscess, staph aureus  sepsis H/o Prosthetic joint infection H/o Blood clots H/o gastric ulcer C diff dx 10-19-15, s/p flagyl x 2 , diarrhea again 03-2016, better after vancomycin. Coordinate care with his daughter [name]  [phone]  PLAN: Cellulitis: Resolved, s/p clindamycin.   Was seen 11/08/2016 with multiple medical problems: At this point COPD, hyperlipidemia, anxiety depression, RLS are all stable. Continue with the same medications. Increased TSH: We'll recheck full TFTs when he comes back in 4 months RTC 4 months

## 2017-02-05 NOTE — Assessment & Plan Note (Signed)
Cellulitis: Resolved, s/p clindamycin.   Was seen 11/08/2016 with multiple medical problems: At this point COPD, hyperlipidemia, anxiety depression, RLS are all stable. Continue with the same medications. Increased TSH: We'll recheck full TFTs when he comes back in 4 months RTC 4 months

## 2017-02-05 NOTE — Patient Instructions (Signed)
See you in 4 months

## 2017-02-08 ENCOUNTER — Telehealth: Payer: Self-pay | Admitting: Internal Medicine

## 2017-02-08 MED ORDER — CLINDAMYCIN HCL 300 MG PO CAPS: 300.0000 mg | ORAL_CAPSULE | Freq: Four times a day (QID) | ORAL | 0 refills | Status: DC

## 2017-02-08 NOTE — Telephone Encounter (Signed)
Spoke w/ [name], informed of recommendations, Rx faxed to [facility]. Informed for pain they could try OTC Tylenol, they will let us know if doesn't help.

## 2017-02-08 NOTE — Telephone Encounter (Signed)
Caller name: Meriel Pica Relationship to patient: daughter Can be reached: [phone] Pharmacy: [facility], [place]  Reason for call: Pt was seen for cellulitis of foot 02/05/17. Daughter called stating foot is getting pink again and pt having excruciating pain in big toe. Requesting either another dose of meds or please call to discuss. Willing to come in again if needed.

## 2017-02-08 NOTE — Telephone Encounter (Signed)
Okay, tell them I  sent a second round of clindamycin. Definitely call or go to the ER if severe symptoms, fever, chills, increased redness.

## 2017-04-25 ENCOUNTER — Ambulatory Visit (INDEPENDENT_AMBULATORY_CARE_PROVIDER_SITE_OTHER): Payer: Medicare Other

## 2017-04-25 DIAGNOSIS — Z23 Encounter for immunization: Secondary | ICD-10-CM | POA: Diagnosis not present

## 2017-04-27 ENCOUNTER — Telehealth: Payer: Self-pay | Admitting: Internal Medicine

## 2017-04-27 NOTE — Telephone Encounter (Signed)
Micheal Frank- can you order Prolia for Pt? Thank you.

## 2017-04-27 NOTE — Telephone Encounter (Signed)
Prolia benefits verified NO PA required 20% co-insurance from Prolia  Patient may owe approximately $210 OOP  Due after: 11.12.18

## 2017-04-30 NOTE — Telephone Encounter (Signed)
Prolia has been ordered

## 2017-05-01 NOTE — Telephone Encounter (Signed)
Pt has appt 06/11/2017, Prolia placed in appt notes for reminder.

## 2017-05-01 NOTE — Telephone Encounter (Signed)
Prolia is here.

## 2017-06-05 ENCOUNTER — Encounter: Payer: Self-pay | Admitting: Internal Medicine

## 2017-06-05 ENCOUNTER — Ambulatory Visit: Payer: Medicare Other | Admitting: Internal Medicine

## 2017-06-05 ENCOUNTER — Ambulatory Visit (HOSPITAL_BASED_OUTPATIENT_CLINIC_OR_DEPARTMENT_OTHER)
Admission: RE | Admit: 2017-06-05 | Discharge: 2017-06-05 | Disposition: A | Discharge status: Home / Self Care | Payer: Medicare Other | Source: Ambulatory Visit | Attending: Internal Medicine | Admitting: Internal Medicine

## 2017-06-05 VITALS — BP 118/68 | HR 71 | Temp 98.1°F | Resp 14 | Ht 70.0 in | Wt 154.1 lb

## 2017-06-05 DIAGNOSIS — M5136 Other intervertebral disc degeneration, lumbar region: Secondary | ICD-10-CM | POA: Diagnosis not present

## 2017-06-05 DIAGNOSIS — W19XXXA Unspecified fall, initial encounter: Secondary | ICD-10-CM | POA: Insufficient documentation

## 2017-06-05 DIAGNOSIS — Z9889 Other specified postprocedural states: Secondary | ICD-10-CM | POA: Diagnosis not present

## 2017-06-05 DIAGNOSIS — N183 Chronic kidney disease, stage 3 unspecified: Secondary | ICD-10-CM

## 2017-06-05 DIAGNOSIS — J449 Chronic obstructive pulmonary disease, unspecified: Secondary | ICD-10-CM | POA: Diagnosis not present

## 2017-06-05 DIAGNOSIS — M549 Dorsalgia, unspecified: Secondary | ICD-10-CM

## 2017-06-05 DIAGNOSIS — M810 Age-related osteoporosis without current pathological fracture: Secondary | ICD-10-CM | POA: Diagnosis not present

## 2017-06-05 DIAGNOSIS — R7989 Other specified abnormal findings of blood chemistry: Secondary | ICD-10-CM

## 2017-06-05 DIAGNOSIS — M159 Polyosteoarthritis, unspecified: Secondary | ICD-10-CM

## 2017-06-05 DIAGNOSIS — M150 Primary generalized (osteo)arthritis: Secondary | ICD-10-CM | POA: Diagnosis not present

## 2017-06-05 DIAGNOSIS — M8949 Other hypertrophic osteoarthropathy, multiple sites: Secondary | ICD-10-CM

## 2017-06-05 LAB — BASIC METABOLIC PANEL
BUN: 26 mg/dL — AB (ref 6–23)
CALCIUM: 9.5 mg/dL (ref 8.4–10.5)
CO2: 29 mEq/L (ref 19–32)
CREATININE: 1.74 mg/dL — AB (ref 0.40–1.50)
Chloride: 105 mEq/L (ref 96–112)
GFR: 39.39 mL/min — AB (ref >60.00)
Glucose, Bld: 71 mg/dL (ref 70–99)
Potassium: 4.1 mEq/L (ref 3.5–5.1)
Sodium: 140 mEq/L (ref 135–145)

## 2017-06-05 LAB — T3, FREE: T3 FREE: 2.1 pg/mL — AB (ref 2.3–4.2)

## 2017-06-05 LAB — TSH: TSH: 5.55 u[IU]/mL — AB (ref 0.35–4.50)

## 2017-06-05 LAB — T4, FREE: Free T4: 0.91 ng/dL (ref 0.60–1.60)

## 2017-06-05 MED ORDER — HYDROCODONE-ACETAMINOPHEN 5-325 MG PO TABS: ORAL_TABLET | ORAL | 0 refills | Status: DC

## 2017-06-05 NOTE — Patient Instructions (Signed)
GO TO THE LAB : Get the blood work     GO TO THE FRONT DESK Come back tomorrow for your Prolia injection.  Please make an appointment  Schedule your next appointment for a check up in 6 months  STOP BY THE FIRST FLOOR:  get the XR     For pain:  Tylenol 500 mg   2 tabs  every 8 hours as needed  If the pain continue, okay to take on hydrocodone now and then  Okay to take Aleve once or twice a week with food.

## 2017-06-05 NOTE — Progress Notes (Signed)
Subjective:    Patient ID: Micheal Frank, male    DOB: 01-19-1926, 83 y.o.   MRN: [id number]  DOS:  06/05/2017 Type of visit - description : acute , here w/ daughter  Interval history: Had a fall 6 days ago, it was an accident, tripped with his dog in a rainy day.  He went backwards, landed on his back and hit the head on the wall. One  of his daughters  witness the incident, there was no loss of consciousness, confusion.  Has not been checked after the incident. Currently complaining of pain at the low back, right side, no radiation. No neck pain, headache, nausea or lower extremity paresthesias.  Pain management: Occasionally takes Aleve, needs a refill on hydrocodone Last TSH is slightly elevated, need to check TFTs. Osteoporosis: Due for Prolia. CKD: Last BMD 4 months ago, labs today.  Review of Systems Denies fever chills No chest pain, lower extremity edema or palpitations.   Past Medical History:  Diagnosis Date  . Arthritis    left leg also has swelling  . Asthma   . Blood dyscrasia    pt CAN NOT have any blood thinners d/t hx brain bleed  . Chronic kidney disease, stage III (moderate) ([place]) 01/20/2013  . Complication of anesthesia    confusion with anesthesia  . Constipation    miralax prn  . COPD (chronic obstructive pulmonary disease) (HCC)    emphysema  . Depression    takes wellbutrin bid  . Epidural abscess   . Gastric ulcer   . History of blood clots   . Hyperlipidemia   . Melanoma ([place])   . Peripheral neuropathy   . Pneumonia    as a child and in 1952;had a pneumonia vaccine couple of years ago  . Prosthetic joint infection ([place])   . S/P right heart catheterization    at least 70yrs  . Shingles   . Short-term memory loss   . Shortness of breath    with exertion  . Sleep apnea    has CPAP but doesn't use it;sleep study done at least 23yrs ago  . Staphylococcus aureus bacteremia with sepsis ([place])   . Subdural hematoma ([place]) 03/30/11  . Urinary  frequency    wears depends    Past Surgical History:  Procedure Laterality Date  . epidural abscess drainage  12/2010  . 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 EYE SURGERY     bil cataract surgery  . FILTERING PROCEDURE     filter placed in neck d/t blood  clot  . HERNIA REPAIR     25+yrs ago  . KNEE ARTHROSCOPY    . SKIN BIOPSY     melanoma on head   . TOTAL KNEE ARTHROPLASTY     x 2 left knee 2003/2012  . TOTAL KNEE REVISION  05/23/2011   Procedure: TOTAL KNEE REVISION;  Surgeon: Meredith Pel;  Location: [facility];  Service: Orthopedics;  Laterality: Left;  LEFT KNEE REMOVAL OF SPACER, POSSIBLE REIMPLANTATION OF REVISION TKA VERSES REPLACEMENT ANTIBIOTIC SPACER    Social History   Socioeconomic History  . Marital status: Widowed    Spouse name: Not on file  . Number of children: 3  . Years of education: Not on file  . Highest education level: Not on file  Social Needs  . Financial resource strain: Not on file  . Food insecurity - worry: Not on file  .  Food insecurity - inability: Not on file  . Transportation needs - medical: Not on file  . Transportation needs - non-medical: Not on file  Occupational History  . Occupation: retired  Tobacco Use  . Smoking status: Former Smoker    Packs/day: 2.00    Years: 40.00    Pack years: 80.00    Types: Cigarettes    Last attempt to quit: 07/17/1978    Years since quitting: 38.9  . Smokeless tobacco: Never Used  Substance and Sexual Activity  . Alcohol use: No  . Drug use: No  . Sexual activity: Not on file  Other Topics Concern  . Not on file  Social History Narrative   Lost wife 06-2013   Lives in his house, 1 daughter and her 2 adult children live w/ him (1g-child w/ special needs)      Allergies as of 06/05/2017      Reactions   Anticoagulant Compound    History of bleeding on the brain per daughter   Protonix [pantoprazole Sodium] Diarrhea      Medication List         Accurate as of 06/05/17  1:44 PM. Always use your most recent med list.          acetaminophen 325 MG tablet Commonly known as:  TYLENOL Take 2 tablets (650 mg total) by mouth every 6 (six) hours as needed for mild pain (or Fever >/= 101).   albuterol 108 (90 Base) MCG/ACT inhaler Commonly known as:  PROAIR HFA Inhale 2 puffs into the lungs every 6 (six) hours as needed for wheezing.   AZO CRANBERRY URINARY TRACT 250-60 MG Caps Generic drug:  Cranberry-Vitamin C Take 1 tablet by mouth daily.   benzonatate 200 MG capsule Commonly known as:  TESSALON TAKE ONE CAPSULE 3-4 TIMES DAILY AS NEEDED FOR COUGH   budesonide-formoterol 160-4.5 MCG/ACT inhaler Commonly known as:  SYMBICORT Inhale 2 puffs into the lungs 2 (two) times daily.   buPROPion 200 MG 12 hr tablet Commonly known as:  WELLBUTRIN SR Take 200 mg by mouth 2 (two) times daily.   calcium carbonate 600 MG Tabs tablet Commonly known as:  OS-CAL Take 600 mg by mouth daily.   clindamycin 300 MG capsule Commonly known as:  CLEOCIN Take 1 capsule (300 mg total) by mouth 4 (four) times daily. X 7 days   diazepam 5 MG tablet Commonly known as:  VALIUM Take 1 tablet (5 mg total) by mouth every 6 (six) hours as needed for anxiety.   doxazosin 4 MG tablet Commonly known as:  CARDURA Take 1 tablet (4 mg total) by mouth at bedtime.   fluticasone 50 MCG/ACT nasal spray Commonly known as:  FLONASE Place 2 sprays into the nose 2 (two) times daily.   HYDROcodone-acetaminophen 5-325 MG tablet Commonly known as:  NORCO/VICODIN Take 1 tablet every 12 hours as needed for pain.   meclizine 25 MG tablet Commonly known as:  ANTIVERT Take 1 tablet (25 mg total) by mouth daily as needed (vertigo).   montelukast 10 MG tablet Commonly known as:  SINGULAIR Take 1 tablet (10 mg total) by mouth daily.   multivitamin with minerals Tabs tablet Take 1 tablet by mouth daily.   rOPINIRole 1 MG tablet Commonly known as:  REQUIP Take  1 tablet (1 mg total) by mouth 2 (two) times daily.   saccharomyces boulardii 250 MG capsule Commonly known as:  FLORASTOR Take 1 capsule (250 mg total) by mouth 2 (two) times daily.  simvastatin 40 MG tablet Commonly known as:  ZOCOR Take 1 tablet (40 mg total) by mouth daily.   tamsulosin 0.4 MG Caps capsule Commonly known as:  FLOMAX Take 1 capsule (0.4 mg total) by mouth daily.   triamterene-hydrochlorothiazide 37.5-25 MG tablet Commonly known as:  MAXZIDE-25 Take 0.5 tablets by mouth daily.   URELLE 81 MG Tabs tablet Take 1 tablet (81 mg total) by mouth every 6 (six) hours as needed for bladder spasms.          Objective:   Physical Exam BP 118/68 (BP Location: Left Arm, Patient Position: Sitting, Cuff Size: Small)   Pulse 71   Temp 98.1 F (36.7 C) (Oral)   Resp 14   Ht 5\' 10"  (1.778 m)   Wt 154 lb 2 oz (69.9 kg) Comment: w/ jacket  SpO2 93%   BMI 22.11 kg/m  General:   Well developed, well nourished . NAD.  HEENT:  Normocephalic . Face symmetric, atraumatic Lungs:  CTA B Normal respiratory effort, no intercostal retractions, no accessory muscle use. Heart: RRR,  no murmur.  No pretibial edema bilaterally  Skin: Not pale. Not jaundice Skin: Slightly TTP right from the lumbar spine Neurologic:  alert & oriented X3.  Speech normal, gait appropriate, assisted by a cane and his daughter.  At baseline Psych--  Cognition and judgment appear intact.  Cooperative with normal attention span and concentration.  Behavior appropriate. No anxious or depressed appearing.      Assessment & Plan:  Assessment   COPD- Dr Melvyn Novas 08-2015, stable, RTC PRN CKD -----Creatinine 1.67, GFR 32 Hyperlipidemia Depression  NEURO: --Decreased memory --Anisocoria- L pupil larger ,s/p surgery --Neuropathy --H/o Mnire's  -- on diuretics , valium, meclizine Osteoporosis -- h/o pelvic Fx, dexa April 2016: T score (-) 2.3;  rx fosamax 03-2015, dc after 3 months d/t dysphagia;  prolia #1 05-03-16 DJD  -- hydrocodone rarely RLS OSA dx remotely, used a CPAP, wt loss and cpap d/c ~ 2012 (Dr Maxwell Caul) GU: --H/o incontinence, saw Dr Karsten Ro, OV ~ 2013 --Urinary retention 02-2016: sepsis, admitted, had a foley temporarily  H/o melanoma H/o subdural hematoma, epidural abscess, staph aureus  sepsis H/o Prosthetic joint infection H/o Blood clots H/o gastric ulcer C diff dx 10-19-15, s/p flagyl x 2 , diarrhea again 03-2016, better after vancomycin. Coordinate care with his daughter [name]  [phone]  PLAN:  Fall: Precautions discussed, mild pain at the low back, get an x-ray. CKD: Check a BMP Osteoporosis: to get Prolia shot in the morning once the x-ray comes back. DJD: Recommend strategy for pain control to be Tylenol first, then hydrocodone.  The patient states that Aleve helps a lot, recommend to be very cautious, maybe 1 or 2 doses a week if needed. COPD: Minimal cough, symptoms controlled Elevated TSH: See last labs, check TFTs RTC 6 months

## 2017-06-05 NOTE — Progress Notes (Signed)
Pre visit review using our clinic review tool, if applicable. No additional management support is needed unless otherwise documented below in the visit note. 

## 2017-06-06 ENCOUNTER — Ambulatory Visit (INDEPENDENT_AMBULATORY_CARE_PROVIDER_SITE_OTHER): Payer: Medicare Other | Admitting: Orthopedic Surgery

## 2017-06-06 ENCOUNTER — Ambulatory Visit: Payer: Medicare Other

## 2017-06-06 ENCOUNTER — Telehealth: Payer: Self-pay | Admitting: Internal Medicine

## 2017-06-06 NOTE — Telephone Encounter (Signed)
Need to know if Norco is being subscribe for a acute or chronic illness. Can only prescribe a 5 day supply for acute issues. [facility] [phone]

## 2017-06-06 NOTE — Assessment & Plan Note (Signed)
Fall: Precautions discussed, mild pain at the low back, get an x-ray. CKD: Check a BMP Osteoporosis: to get Prolia shot in the morning once the x-ray comes back. DJD: Recommend strategy for pain control to be Tylenol first, then hydrocodone.  The patient states that Aleve helps a lot, recommend to be very cautious, maybe 1 or 2 doses a week if needed. COPD: Minimal cough, symptoms controlled Elevated TSH: See last labs, check TFTs RTC 6 months

## 2017-06-06 NOTE — Telephone Encounter (Signed)
Need clarification on this med refill.

## 2017-06-06 NOTE — Telephone Encounter (Signed)
Copied from [place] [id number]. Topic: Quick Communication - See Telephone Encounter >> Jun 06, 2017  8:32 AM Synthia Innocent wrote: CRM for notification. See Telephone encounter for:  Medication question 06/06/17.

## 2017-06-11 ENCOUNTER — Ambulatory Visit (HOSPITAL_BASED_OUTPATIENT_CLINIC_OR_DEPARTMENT_OTHER)
Admission: RE | Admit: 2017-06-11 | Discharge: 2017-06-11 | Disposition: A | Discharge status: Home / Self Care | Payer: Medicare Other | Source: Ambulatory Visit | Attending: Internal Medicine | Admitting: Internal Medicine

## 2017-06-11 ENCOUNTER — Ambulatory Visit: Payer: Medicare Other | Admitting: Internal Medicine

## 2017-06-11 ENCOUNTER — Encounter: Payer: Self-pay | Admitting: Internal Medicine

## 2017-06-11 VITALS — BP 122/68 | HR 78 | Temp 98.0°F | Resp 14 | Ht 70.0 in | Wt 154.1 lb

## 2017-06-11 DIAGNOSIS — K5900 Constipation, unspecified: Secondary | ICD-10-CM | POA: Diagnosis not present

## 2017-06-11 DIAGNOSIS — M549 Dorsalgia, unspecified: Secondary | ICD-10-CM | POA: Diagnosis not present

## 2017-06-11 DIAGNOSIS — M4854XA Collapsed vertebra, not elsewhere classified, thoracic region, initial encounter for fracture: Secondary | ICD-10-CM | POA: Insufficient documentation

## 2017-06-11 LAB — CBC WITH DIFFERENTIAL/PLATELET
BASOS PCT: 0.4 % (ref 0.0–3.0)
Basophils Absolute: 0 10*3/uL (ref 0.0–0.1)
EOS PCT: 3.8 % (ref 0.0–5.0)
Eosinophils Absolute: 0.4 10*3/uL (ref 0.0–0.7)
HCT: 44.6 % (ref 39.0–52.0)
HEMOGLOBIN: 14.7 g/dL (ref 13.0–17.0)
LYMPHS ABS: 1.6 10*3/uL (ref 0.7–4.0)
Lymphocytes Relative: 16.6 % (ref 12.0–46.0)
MCHC: 32.9 g/dL (ref 30.0–36.0)
MCV: 95.5 fl (ref 78.0–100.0)
Monocytes Absolute: 1.2 10*3/uL — ABNORMAL HIGH (ref 0.1–1.0)
Monocytes Relative: 12.6 % — ABNORMAL HIGH (ref 3.0–12.0)
NEUTROS PCT: 66.6 % (ref 43.0–77.0)
Neutro Abs: 6.5 10*3/uL (ref 1.4–7.7)
Platelets: 287 10*3/uL (ref 150.0–400.0)
RBC: 4.67 Mil/uL (ref 4.22–5.81)
RDW: 13.1 % (ref 11.5–15.5)
WBC: 9.7 10*3/uL (ref 4.0–10.5)

## 2017-06-11 NOTE — Progress Notes (Signed)
Subjective:    Patient ID: Micheal Frank, male    DOB: 19-Mar-1926, 83 y.o.   MRN: [id number]  DOS:  06/11/2017 Type of visit - description : Acute, here w/ [name] Interval history: Was seen few days ago after a fall, had f back pain, x-rays were negative. Is here because the pain is worse. Still at the low back, more on the right than on the left, no pain at rest, increased pain with any movement like getting up from a chair or transferring. Less mobile  due to pain No radiation down to the buttocks, some radiation upward. Daughter is concerned b/c pt had a infection in the back  few years ago that started w/ back pain. He is taking hydrocodone, got slightly constipated despite taking a stool softener.  Yesterday he got suppository and he had a big bowel movement.  Wt Readings from Last 3 Encounters:  06/11/17 154 lb 2 oz (69.9 kg)  06/05/17 154 lb 2 oz (69.9 kg)  02/05/17 155 lb 4 oz (70.4 kg)    Review of Systems No fever chills Occasional nausea when he belches , no vomiting, no blood in the stools or abdominal pain. Appetite is somewhat decreased  Past Medical History:  Diagnosis Date  . Arthritis    left leg also has swelling  . Asthma   . Blood dyscrasia    pt CAN NOT have any blood thinners d/t hx brain bleed  . Chronic kidney disease, stage III (moderate) ([facility]) 01/20/2013  . Complication of anesthesia    confusion with anesthesia  . Constipation    miralax prn  . COPD (chronic obstructive pulmonary disease) ([facility])    emphysema  . Depression    takes wellbutrin bid  . Epidural abscess   . Gastric ulcer   . History of blood clots   . Hyperlipidemia   . Melanoma ([facility])   . Peripheral neuropathy   . Pneumonia    as a child and in 1952;had a pneumonia vaccine couple of years ago  . Prosthetic joint infection ([place])   . S/P right heart catheterization    at least 88yrs  . Shingles   . Short-term memory loss   . Shortness of breath    with exertion  . Sleep  apnea    has CPAP but doesn't use it;sleep study done at least 47yrs ago  . Staphylococcus aureus bacteremia with sepsis ([facility])   . Subdural hematoma ([facility]) 03/30/11  . Urinary frequency    wears depends    Past Surgical History:  Procedure Laterality Date  . epidural abscess drainage  12/2010  . 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 EYE SURGERY     bil cataract surgery  . FILTERING PROCEDURE     filter placed in neck d/t blood  clot  . HERNIA REPAIR     25+yrs ago  . KNEE ARTHROSCOPY    . SKIN BIOPSY     melanoma on head   . TOTAL KNEE ARTHROPLASTY     x 2 left knee 2003/2012  . TOTAL KNEE REVISION  05/23/2011   Procedure: TOTAL KNEE REVISION;  Surgeon: Meredith Pel;  Location: [facility];  Service: Orthopedics;  Laterality: Left;  LEFT KNEE REMOVAL OF SPACER, POSSIBLE REIMPLANTATION OF REVISION TKA VERSES REPLACEMENT ANTIBIOTIC SPACER    Social History   Socioeconomic History  . Marital status: Widowed    Spouse name: Not on file  .  Number of children: 3  . Years of education: Not on file  . Highest education level: Not on file  Social Needs  . Financial resource strain: Not on file  . Food insecurity - worry: Not on file  . Food insecurity - inability: Not on file  . Transportation needs - medical: Not on file  . Transportation needs - non-medical: Not on file  Occupational History  . Occupation: retired  Tobacco Use  . Smoking status: Former Smoker    Packs/day: 2.00    Years: 40.00    Pack years: 80.00    Types: Cigarettes    Last attempt to quit: 07/17/1978    Years since quitting: 38.9  . Smokeless tobacco: Never Used  Substance and Sexual Activity  . Alcohol use: No  . Drug use: No  . Sexual activity: Not on file  Other Topics Concern  . Not on file  Social History Narrative   Lost wife 06-2013   Lives in his house, 1 daughter and her 2 adult children live w/ him (1g-child w/ special needs)      Allergies as of  06/11/2017      Reactions   Anticoagulant Compound    History of bleeding on the brain per daughter   Protonix [pantoprazole Sodium] Diarrhea      Medication List        Accurate as of 06/11/17  6:08 PM. Always use your most recent med list.          acetaminophen 325 MG tablet Commonly known as:  TYLENOL Take 2 tablets (650 mg total) by mouth every 6 (six) hours as needed for mild pain (or Fever >/= 101).   albuterol 108 (90 Base) MCG/ACT inhaler Commonly known as:  PROAIR HFA Inhale 2 puffs into the lungs every 6 (six) hours as needed for wheezing.   AZO CRANBERRY URINARY TRACT 250-60 MG Caps Generic drug:  Cranberry-Vitamin C Take 1 tablet by mouth daily.   benzonatate 200 MG capsule Commonly known as:  TESSALON TAKE ONE CAPSULE 3-4 TIMES DAILY AS NEEDED FOR COUGH   budesonide-formoterol 160-4.5 MCG/ACT inhaler Commonly known as:  SYMBICORT Inhale 2 puffs into the lungs 2 (two) times daily.   buPROPion 200 MG 12 hr tablet Commonly known as:  WELLBUTRIN SR Take 200 mg by mouth 2 (two) times daily.   calcium carbonate 600 MG Tabs tablet Commonly known as:  OS-CAL Take 600 mg by mouth daily.   diazepam 5 MG tablet Commonly known as:  VALIUM Take 1 tablet (5 mg total) by mouth every 6 (six) hours as needed for anxiety.   doxazosin 4 MG tablet Commonly known as:  CARDURA Take 1 tablet (4 mg total) by mouth at bedtime.   fluticasone 50 MCG/ACT nasal spray Commonly known as:  FLONASE Place 2 sprays into the nose 2 (two) times daily.   HYDROcodone-acetaminophen 5-325 MG tablet Commonly known as:  NORCO/VICODIN Take 1 tablet every 12 hours as needed for pain.   meclizine 25 MG tablet Commonly known as:  ANTIVERT Take 1 tablet (25 mg total) by mouth daily as needed (vertigo).   montelukast 10 MG tablet Commonly known as:  SINGULAIR Take 1 tablet (10 mg total) by mouth daily.   multivitamin with minerals Tabs tablet Take 1 tablet by mouth daily.     rOPINIRole 1 MG tablet Commonly known as:  REQUIP Take 1 tablet (1 mg total) by mouth 2 (two) times daily.   saccharomyces boulardii 250 MG capsule  Commonly known as:  FLORASTOR Take 1 capsule (250 mg total) by mouth 2 (two) times daily.   simvastatin 40 MG tablet Commonly known as:  ZOCOR Take 1 tablet (40 mg total) by mouth daily.   tamsulosin 0.4 MG Caps capsule Commonly known as:  FLOMAX Take 1 capsule (0.4 mg total) by mouth daily.   triamterene-hydrochlorothiazide 37.5-25 MG tablet Commonly known as:  MAXZIDE-25 Take 0.5 tablets by mouth daily.   URELLE 81 MG Tabs tablet Take 1 tablet (81 mg total) by mouth every 6 (six) hours as needed for bladder spasms.          Objective:   Physical Exam BP 122/68 (BP Location: Left Arm, Patient Position: Sitting, Cuff Size: Small)   Pulse 78   Temp 98 F (36.7 C) (Oral)   Resp 14   Ht 5\' 10"  (1.778 m)   Wt 154 lb 2 oz (69.9 kg)   SpO2 98%   BMI 22.11 kg/m  General:   Well developed, sitting in a wheelchair in no distress at rest. HEENT:  Normocephalic . Face symmetric, atraumatic Lungs:  CTA B Normal respiratory effort, no intercostal retractions, no accessory muscle use. Heart: RRR,  no murmur.  No pretibial edema bilaterally  MSK: Mildly to moderately TTP at the distal T-spine lower back more so on the right. Skin: Not pale. Not jaundice Neurologic:  alert & oriented X3.  Speech normal, gait not tested but moves all extremities without problems while sitting in a wheelchair Psych--  Cognition and judgment appear intact.  Cooperative with normal attention span and concentration.  Behavior appropriate. No anxious or depressed appearing.      Assessment & Plan:    Assessment   COPD- Dr Melvyn Novas 08-2015, stable, RTC PRN CKD -----Creatinine 1.67, GFR 32 Hyperlipidemia Depression  NEURO: --Decreased memory --Anisocoria- L pupil larger ,s/p surgery --Neuropathy --H/o Mnire's  -- on diuretics , valium,  meclizine Osteoporosis -- h/o pelvic Fx, dexa April 2016: T score (-) 2.3;  rx fosamax 03-2015, dc after 3 months d/t dysphagia; prolia #1 05-03-16 DJD  -- hydrocodone rarely RLS OSA dx remotely, used a CPAP, wt loss and cpap d/c ~ 2012 (Dr Maxwell Caul) GU: --H/o incontinence, saw Dr Karsten Ro, OV ~ 2013 --Urinary retention 02-2016: sepsis, admitted, had a foley temporarily  H/o melanoma H/o subdural hematoma, epidural abscess, staph aureus  sepsis H/o Prosthetic joint infection H/o Blood clots H/o gastric ulcer C diff dx 10-19-15, s/p flagyl x 2 , diarrhea again 03-2016, better after vancomycin. Coordinate care with his daughter [name]  [phone]  PLAN:  Here with his daughter Linward Natal, back injury: See last visit, had a fall, L-S  x-ray were negative.  He is here because the back pain has increased compared to last week, this is limiting his mobility.  Also taking hydrocodone on and off and that has created some constipation. Daughter is concerned about pain due to a  "spine infection" as he had before and requests a CBC (will get one). Pain is a still likely due to back contusion, to be sure we are going to get a thoracic spine x-ray to r/o a higher vertebral  Fx ; will try to manage his pain with Tylenol 1000 mg TID,  hydrocodone only if pain not controlled w/ Tylenol, encourage mobility to prevent stiffness (and worsening pain). Constipation prevention with senna, MiraLAX, see instructions. Subclinical hypothyroidism: Labs discussed  Osteoporosis: To get Prolia in a couple of days . Next visit with me to 3 months  Today, I spent more than  28  min with the patient and his daughter : >50% of the time counseling regards back pain management including mobilization, use of Tylenol and decrease the use of hydrocodone.  Also talking about constipation prevention.  Multiple questionanswered to the best of my ability

## 2017-06-11 NOTE — Telephone Encounter (Signed)
Daughter checking on the status of HYDROcodone-acetaminophen (NORCO/VICODIN) 5-325 MG tablet, stating the pharmacy in need of clarification, please advise   HYDROcodone-acetaminophen (NORCO/VICODIN) 5-325 MG tablet

## 2017-06-11 NOTE — Assessment & Plan Note (Signed)
Here with his daughter Linward Natal, back injury: See last visit, had a fall, L-S  x-ray were negative.  He is here because the back pain has increased compared to last week, this is limiting his mobility.  Also taking hydrocodone on and off and that has created some constipation. Daughter is concerned about pain due to a  "spine infection" as he had before and requests a CBC (will get one). Pain is a still likely due to back contusion, to be sure we are going to get a thoracic spine x-ray to r/o a higher vertebral  Fx ; will try to manage his pain with Tylenol 1000 mg TID,  hydrocodone only if pain not controlled w/ Tylenol, encourage mobility to prevent stiffness (and worsening pain). Constipation prevention with senna, MiraLAX, see instructions. Subclinical hypothyroidism: Labs discussed  Osteoporosis: To get Prolia in a couple of days . Next visit with me to 3 months

## 2017-06-11 NOTE — Progress Notes (Signed)
Pre visit review using our clinic review tool, if applicable. No additional management support is needed unless otherwise documented below in the visit note. 

## 2017-06-11 NOTE — Telephone Encounter (Signed)
[facility] (currently closed), [facility] informing pharmacist that Hydrocodone for Pt is chronic. Instructed to fill ASAP as Pt has been waiting over weekend for it. Instructed them to call if questions/concerns.

## 2017-06-11 NOTE — Patient Instructions (Addendum)
GO TO THE LAB : Get the blood work     STOP BY THE FIRST FLOOR:  get the XR   For pain: --Tylenol  500 mg OTC 2 tabs a day every 8 hours as needed for pain --If the pain is not well controlled, instead of taking Tylenol take hydrocodone --Warm compresses  Constipation prevention: --Senna OTC daily --MiraLAX 17 g daily or every other day --OTC glycerin suppository as needed

## 2017-06-13 ENCOUNTER — Ambulatory Visit (INDEPENDENT_AMBULATORY_CARE_PROVIDER_SITE_OTHER): Payer: Medicare Other

## 2017-06-13 DIAGNOSIS — M810 Age-related osteoporosis without current pathological fracture: Secondary | ICD-10-CM | POA: Diagnosis not present

## 2017-06-13 MED ORDER — DENOSUMAB 60 MG/ML ~~LOC~~ SOLN
60.0000 mg | Freq: Once | SUBCUTANEOUS | Status: AC
  Administered 2017-06-13: 60 mg via SUBCUTANEOUS

## 2017-06-13 NOTE — Progress Notes (Signed)
Pre visit review using our clinic review tool, if applicable. No additional management support is needed unless otherwise documented below in the visit note.  Pt here today for 6 month Prolia injection. Injected in L arm. Pt tolerated injection well.

## 2017-06-21 ENCOUNTER — Ambulatory Visit: Payer: Medicare Other

## 2017-06-23 ENCOUNTER — Encounter (HOSPITAL_BASED_OUTPATIENT_CLINIC_OR_DEPARTMENT_OTHER): Payer: Self-pay | Admitting: Emergency Medicine

## 2017-06-23 ENCOUNTER — Other Ambulatory Visit: Payer: Self-pay

## 2017-06-23 ENCOUNTER — Emergency Department (HOSPITAL_BASED_OUTPATIENT_CLINIC_OR_DEPARTMENT_OTHER): Payer: Medicare Other

## 2017-06-23 ENCOUNTER — Inpatient Hospital Stay (HOSPITAL_BASED_OUTPATIENT_CLINIC_OR_DEPARTMENT_OTHER)
Admission: EM | Admit: 2017-06-23 | Discharge: 2017-06-27 | DRG: 193 | Disposition: A | Discharge status: Home Health | Payer: Medicare Other | Attending: Family Medicine | Admitting: Family Medicine

## 2017-06-23 DIAGNOSIS — Z77090 Contact with and (suspected) exposure to asbestos: Secondary | ICD-10-CM | POA: Diagnosis present

## 2017-06-23 DIAGNOSIS — B965 Pseudomonas (aeruginosa) (mallei) (pseudomallei) as the cause of diseases classified elsewhere: Secondary | ICD-10-CM | POA: Diagnosis present

## 2017-06-23 DIAGNOSIS — Z7951 Long term (current) use of inhaled steroids: Secondary | ICD-10-CM

## 2017-06-23 DIAGNOSIS — R531 Weakness: Secondary | ICD-10-CM

## 2017-06-23 DIAGNOSIS — Z808 Family history of malignant neoplasm of other organs or systems: Secondary | ICD-10-CM

## 2017-06-23 DIAGNOSIS — W1830XD Fall on same level, unspecified, subsequent encounter: Secondary | ICD-10-CM

## 2017-06-23 DIAGNOSIS — Z85820 Personal history of malignant melanoma of skin: Secondary | ICD-10-CM

## 2017-06-23 DIAGNOSIS — K5900 Constipation, unspecified: Secondary | ICD-10-CM | POA: Diagnosis present

## 2017-06-23 DIAGNOSIS — N390 Urinary tract infection, site not specified: Secondary | ICD-10-CM | POA: Diagnosis present

## 2017-06-23 DIAGNOSIS — F329 Major depressive disorder, single episode, unspecified: Secondary | ICD-10-CM | POA: Diagnosis present

## 2017-06-23 DIAGNOSIS — J309 Allergic rhinitis, unspecified: Secondary | ICD-10-CM | POA: Diagnosis present

## 2017-06-23 DIAGNOSIS — M549 Dorsalgia, unspecified: Secondary | ICD-10-CM

## 2017-06-23 DIAGNOSIS — Z86718 Personal history of other venous thrombosis and embolism: Secondary | ICD-10-CM

## 2017-06-23 DIAGNOSIS — Z87891 Personal history of nicotine dependence: Secondary | ICD-10-CM

## 2017-06-23 DIAGNOSIS — E785 Hyperlipidemia, unspecified: Secondary | ICD-10-CM | POA: Diagnosis present

## 2017-06-23 DIAGNOSIS — M4854XD Collapsed vertebra, not elsewhere classified, thoracic region, subsequent encounter for fracture with routine healing: Secondary | ICD-10-CM | POA: Diagnosis present

## 2017-06-23 DIAGNOSIS — G2581 Restless legs syndrome: Secondary | ICD-10-CM | POA: Diagnosis present

## 2017-06-23 DIAGNOSIS — J440 Chronic obstructive pulmonary disease with acute lower respiratory infection: Secondary | ICD-10-CM | POA: Diagnosis present

## 2017-06-23 DIAGNOSIS — J449 Chronic obstructive pulmonary disease, unspecified: Secondary | ICD-10-CM | POA: Diagnosis present

## 2017-06-23 DIAGNOSIS — Z7952 Long term (current) use of systemic steroids: Secondary | ICD-10-CM

## 2017-06-23 DIAGNOSIS — N183 Chronic kidney disease, stage 3 unspecified: Secondary | ICD-10-CM | POA: Diagnosis present

## 2017-06-23 DIAGNOSIS — G629 Polyneuropathy, unspecified: Secondary | ICD-10-CM | POA: Diagnosis present

## 2017-06-23 DIAGNOSIS — E43 Unspecified severe protein-calorie malnutrition: Secondary | ICD-10-CM | POA: Diagnosis present

## 2017-06-23 DIAGNOSIS — J181 Lobar pneumonia, unspecified organism: Principal | ICD-10-CM | POA: Diagnosis present

## 2017-06-23 DIAGNOSIS — G4730 Sleep apnea, unspecified: Secondary | ICD-10-CM | POA: Diagnosis present

## 2017-06-23 DIAGNOSIS — Z79899 Other long term (current) drug therapy: Secondary | ICD-10-CM

## 2017-06-23 DIAGNOSIS — N400 Enlarged prostate without lower urinary tract symptoms: Secondary | ICD-10-CM | POA: Diagnosis present

## 2017-06-23 DIAGNOSIS — I129 Hypertensive chronic kidney disease with stage 1 through stage 4 chronic kidney disease, or unspecified chronic kidney disease: Secondary | ICD-10-CM | POA: Diagnosis present

## 2017-06-23 DIAGNOSIS — Z6820 Body mass index (BMI) 20.0-20.9, adult: Secondary | ICD-10-CM

## 2017-06-23 DIAGNOSIS — H9190 Unspecified hearing loss, unspecified ear: Secondary | ICD-10-CM | POA: Diagnosis present

## 2017-06-23 DIAGNOSIS — Z888 Allergy status to other drugs, medicaments and biological substances status: Secondary | ICD-10-CM

## 2017-06-23 DIAGNOSIS — S22080A Wedge compression fracture of T11-T12 vertebra, initial encounter for closed fracture: Secondary | ICD-10-CM | POA: Diagnosis present

## 2017-06-23 DIAGNOSIS — R350 Frequency of micturition: Secondary | ICD-10-CM | POA: Diagnosis present

## 2017-06-23 DIAGNOSIS — Z7983 Long term (current) use of bisphosphonates: Secondary | ICD-10-CM

## 2017-06-23 DIAGNOSIS — Z96652 Presence of left artificial knee joint: Secondary | ICD-10-CM | POA: Diagnosis present

## 2017-06-23 DIAGNOSIS — R52 Pain, unspecified: Secondary | ICD-10-CM

## 2017-06-23 DIAGNOSIS — M810 Age-related osteoporosis without current pathological fracture: Secondary | ICD-10-CM | POA: Diagnosis present

## 2017-06-23 DIAGNOSIS — M1711 Unilateral primary osteoarthritis, right knee: Secondary | ICD-10-CM | POA: Diagnosis present

## 2017-06-23 LAB — BASIC METABOLIC PANEL
ANION GAP: 8 (ref 5–15)
BUN: 28 mg/dL — ABNORMAL HIGH (ref 6–20)
CALCIUM: 8.4 mg/dL — AB (ref 8.9–10.3)
CO2: 24 mmol/L (ref 22–32)
CREATININE: 1.33 mg/dL — AB (ref 0.61–1.24)
Chloride: 107 mmol/L (ref 101–111)
GFR, EST AFRICAN AMERICAN: 53 mL/min — AB (ref >60)
GFR, EST NON AFRICAN AMERICAN: 46 mL/min — AB (ref >60)
GLUCOSE: 88 mg/dL (ref 65–99)
Potassium: 3.4 mmol/L — ABNORMAL LOW (ref 3.5–5.1)
Sodium: 139 mmol/L (ref 135–145)

## 2017-06-23 LAB — URINALYSIS, ROUTINE W REFLEX MICROSCOPIC
BILIRUBIN URINE: NEGATIVE
Glucose, UA: NEGATIVE mg/dL
HGB URINE DIPSTICK: NEGATIVE
Ketones, ur: NEGATIVE mg/dL
NITRITE: POSITIVE — AB
PROTEIN: NEGATIVE mg/dL
SPECIFIC GRAVITY, URINE: 1.02 (ref 1.005–1.030)
pH: 7.5 (ref 5.0–8.0)

## 2017-06-23 LAB — CBC
HCT: 39.9 % (ref 39.0–52.0)
Hemoglobin: 13.1 g/dL (ref 13.0–17.0)
MCH: 31 pg (ref 26.0–34.0)
MCHC: 32.8 g/dL (ref 30.0–36.0)
MCV: 94.5 fL (ref 78.0–100.0)
PLATELETS: 271 10*3/uL (ref 150–400)
RBC: 4.22 MIL/uL (ref 4.22–5.81)
RDW: 13.9 % (ref 11.5–15.5)
WBC: 14.5 10*3/uL — ABNORMAL HIGH (ref 4.0–10.5)

## 2017-06-23 LAB — CBG MONITORING, ED: GLUCOSE-CAPILLARY: 90 mg/dL (ref 65–99)

## 2017-06-23 LAB — URINALYSIS, MICROSCOPIC (REFLEX)

## 2017-06-23 LAB — I-STAT CG4 LACTIC ACID, ED: LACTIC ACID, VENOUS: 1.17 mmol/L (ref 0.5–1.9)

## 2017-06-23 MED ORDER — SODIUM CHLORIDE 0.9 % IV BOLUS (SEPSIS)
500.0000 mL | Freq: Once | INTRAVENOUS | Status: AC
  Administered 2017-06-23: 500 mL via INTRAVENOUS

## 2017-06-23 MED ORDER — CEFTRIAXONE SODIUM 1 G IJ SOLR
1.0000 g | Freq: Once | INTRAMUSCULAR | Status: AC
  Administered 2017-06-23: 1 g via INTRAVENOUS
  Filled 2017-06-23: qty 10

## 2017-06-23 MED ORDER — AZITHROMYCIN 250 MG PO TABS
500.0000 mg | ORAL_TABLET | Freq: Once | ORAL | Status: AC
  Administered 2017-06-24: 500 mg via ORAL
  Filled 2017-06-23: qty 2

## 2017-06-23 MED ORDER — HYDROCODONE-ACETAMINOPHEN 5-325 MG PO TABS
1.0000 | ORAL_TABLET | Freq: Once | ORAL | Status: AC
  Administered 2017-06-23: 1 via ORAL
  Filled 2017-06-23: qty 1

## 2017-06-23 NOTE — ED Triage Notes (Signed)
Pt has T12 fx. Last dose of hydrocodone was yesterday. Pt reports pain is returning. Daughter states pt is weak and has not been eating.

## 2017-06-23 NOTE — ED Provider Notes (Signed)
[facility] EMERGENCY DEPARTMENT Provider Note   CSN: [id number] Arrival date & time: 06/23/17  1901     History   Chief Complaint Chief Complaint  Patient presents with  . Back Pain  . Weakness    HPI Micheal Frank is a 83 y.o. male.  The history is provided by the patient and a relative. No language interpreter was used.  Back Pain   Associated symptoms include weakness.  Weakness     Micheal Frank is a 83 y.o. male who presents to the Emergency Department complaining of weakness.  He presents a company by his daughter for evaluation of weakness that is worsened over the last 3-4 days.  A couple weeks ago he had a T12 fracture and was treated with hydrocodone.  Overall his pain from the fracture is improved but due to the hydrocodone he had less activity and was eating and drinking less and over the last 3 days he has had progressive generalized weakness.  He is sleeping more and has decreased oral intake.  No constipation.  He is unsure if he is having dysuria but he is making urine well.  He is having more difficulty walking due to feeling weak and shaky.  No reports of fevers, chest pain, shortness of breath.  Symptoms are moderate, constant, worsening.  Past Medical History:  Diagnosis Date  . Arthritis    left leg also has swelling  . Asthma   . Blood dyscrasia    pt CAN NOT have any blood thinners d/t hx brain bleed  . Chronic kidney disease, stage III (moderate) ([place]) 01/20/2013  . Complication of anesthesia    confusion with anesthesia  . Constipation    miralax prn  . COPD (chronic obstructive pulmonary disease) ([facility])    emphysema  . Depression    takes wellbutrin bid  . Epidural abscess   . Gastric ulcer   . History of blood clots   . Hyperlipidemia   . Melanoma ([place])   . Peripheral neuropathy   . Pneumonia    as a child and in 1952;had a pneumonia vaccine couple of years ago  . Prosthetic joint infection ([place])   . S/P right heart  catheterization    at least 70yrs  . Shingles   . Short-term memory loss   . Shortness of breath    with exertion  . Sleep apnea    has CPAP but doesn't use it;sleep study done at least 59yrs ago  . Staphylococcus aureus bacteremia with sepsis ([place])   . Subdural hematoma ([place]) 03/30/11  . Urinary frequency    wears depends    Patient Active Problem List   Diagnosis Date Noted  . UTI (urinary tract infection) 06/24/2017  . Presence of left artificial knee joint 07/24/2016  . Chronic pain of left knee 07/24/2016  . Chronic midline low back pain without sciatica 07/24/2016  . Depression 07/09/2016  . C. difficile diarrhea 05/04/2016  . Chronic cough 08/16/2015  . PCP NOTES >>>>>> 04/06/2015  . CTS (carpal tunnel syndrome) 11/16/2014  . Hyperlipidemia 08/17/2014  . Superficial bruising of chest wall 07/29/2014  . Urgency incontinence 01/23/2013  . Chronic kidney disease, stage III (moderate) ([place]) 01/20/2013  . At high risk for falls 11/28/2012  . ALLERGIC RHINITIS 12/09/2009  . Carcinoma in situ of skin 04/30/2008  . RESTLESS LEG SYNDROME 04/30/2008  . NEUROPATHY 04/30/2008  . GLAUCOMA, BORDERLINE 04/30/2008  . Meniere's disease 04/30/2008  . HEARING LOSS 04/30/2008  .  COPD GOLD II  04/30/2008  . DJD (degenerative joint disease) 04/30/2008  . Osteoporosis 04/30/2008  . Sleep apnea 04/30/2008  . HISTORY OF ASBESTOS EXPOSURE 04/30/2008    Past Surgical History:  Procedure Laterality Date  . epidural abscess drainage  12/2010  . 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 EYE SURGERY     bil cataract surgery  . FILTERING PROCEDURE     filter placed in neck d/t blood  clot  . HERNIA REPAIR     25+yrs ago  . KNEE ARTHROSCOPY    . SKIN BIOPSY     melanoma on head   . TOTAL KNEE ARTHROPLASTY     x 2 left knee 2003/2012  . TOTAL KNEE REVISION  05/23/2011   Procedure: TOTAL KNEE REVISION;  Surgeon: Meredith Pel;  Location: [facility];  Service:  Orthopedics;  Laterality: Left;  LEFT KNEE REMOVAL OF SPACER, POSSIBLE REIMPLANTATION OF REVISION TKA VERSES REPLACEMENT ANTIBIOTIC SPACER       Home Medications    Prior to Admission medications   Medication Sig Start Date End Date Taking? Authorizing Provider  acetaminophen (TYLENOL) 325 MG tablet Take 2 tablets (650 mg total) by mouth every 6 (six) hours as needed for mild pain (or Fever >/= 101). 02/22/16   Regalado, Belkys A, MD  albuterol (PROAIR HFA) 108 (90 BASE) MCG/ACT inhaler Inhale 2 puffs into the lungs every 6 (six) hours as needed for wheezing. 07/31/14   Elsie Stain, MD  benzonatate (TESSALON) 200 MG capsule TAKE ONE CAPSULE 3-4 TIMES DAILY AS NEEDED FOR COUGH Patient taking differently: Take 200 mg by mouth 2 (two) times daily as needed for cough. TAKE ONE CAPSULE 3-4 TIMES DAILY AS NEEDED FOR COUGH 08/16/15   Tanda Rockers, MD  budesonide-formoterol [facility]) 160-4.5 MCG/ACT inhaler Inhale 2 puffs into the lungs 2 (two) times daily.    [provider]  buPROPion (WELLBUTRIN SR) 200 MG 12 hr tablet Take 200 mg by mouth 2 (two) times daily.    [provider]  calcium carbonate (OS-CAL) 600 MG TABS Take 600 mg by mouth daily.     [provider]  Cranberry-Vitamin C (AZO CRANBERRY URINARY TRACT) 250-60 MG CAPS Take 1 tablet by mouth daily.    [provider]  diazepam (VALIUM) 5 MG tablet Take 1 tablet (5 mg total) by mouth every 6 (six) hours as needed for anxiety. 03/01/16   Colon Branch, MD  doxazosin (CARDURA) 4 MG tablet Take 1 tablet (4 mg total) by mouth at bedtime. 11/06/16   Colon Branch, MD  fluticasone [facility]) 50 MCG/ACT nasal spray Place 2 sprays into the nose 2 (two) times daily. 08/08/12   Elsie Stain, MD  HYDROcodone-acetaminophen (NORCO/VICODIN) 5-325 MG tablet Take 1 tablet every 12 hours as needed for pain. 06/05/17   Colon Branch, MD  meclizine (ANTIVERT) 25 MG tablet Take 1 tablet (25 mg total) by mouth daily as  needed (vertigo). 03/01/16   Colon Branch, MD  montelukast (SINGULAIR) 10 MG tablet Take 1 tablet (10 mg total) by mouth daily. 09/20/16   Colon Branch, MD  Multiple Vitamin (MULTIVITAMIN WITH MINERALS) TABS Take 1 tablet by mouth daily.    [provider]  rOPINIRole (REQUIP) 1 MG tablet Take 1 tablet (1 mg total) by mouth 2 (two) times daily. 11/08/16   Colon Branch, MD  saccharomyces boulardii (FLORASTOR) 250 MG capsule  Take 1 capsule (250 mg total) by mouth 2 (two) times daily. 03/09/16   Colon Branch, MD  simvastatin (ZOCOR) 40 MG tablet Take 1 tablet (40 mg total) by mouth daily. 09/20/16   Colon Branch, MD  tamsulosin (FLOMAX) 0.4 MG CAPS capsule Take 1 capsule (0.4 mg total) by mouth daily. 09/20/16   Colon Branch, MD  triamterene-hydrochlorothiazide (MAXZIDE-25) 37.5-25 MG tablet Take 0.5 tablets by mouth daily. 11/10/16   Colon Branch, MD  URELLE (URELLE/URISED) 81 MG TABS tablet Take 1 tablet (81 mg total) by mouth every 6 (six) hours as needed for bladder spasms. 02/22/16   Regalado, Cassie Freer, MD    Family History Family History  Problem Relation Age of Onset  . Melanoma Brother   . CAD Neg Hx   . Diabetes Neg Hx     Social History Social History   Tobacco Use  . Smoking status: Former Smoker    Packs/day: 2.00    Years: 40.00    Pack years: 80.00    Types: Cigarettes    Last attempt to quit: 07/17/1978    Years since quitting: 38.9  . Smokeless tobacco: Never Used  Substance Use Topics  . Alcohol use: No  . Drug use: No     Allergies   Anticoagulant compound and Protonix [pantoprazole sodium]   Review of Systems Review of Systems  Musculoskeletal: Positive for back pain.  Neurological: Positive for weakness.  All other systems reviewed and are negative.    Physical Exam Updated Vital Signs BP 135/82   Pulse 68   Temp 98.7 F (37.1 C) (Oral)   Resp 16   Ht 5\' 10"  (1.778 m)   Wt 65.8 kg (145 lb)   SpO2 94%   BMI 20.81 kg/m   Physical Exam  Constitutional:  He is oriented to person, place, and time. He appears well-developed and well-nourished.  HENT:  Head: Normocephalic and atraumatic.  Eyes:  anisocoria  Cardiovascular: Normal rate and regular rhythm.  No murmur heard. Pulmonary/Chest: Effort normal and breath sounds normal. No respiratory distress.  Abdominal: Soft. There is no tenderness. There is no rebound and no guarding.  Musculoskeletal: He exhibits no edema or tenderness.  Neurological: He is alert and oriented to person, place, and time.  5 out of 5 strength in all 4 extremities with sensation to light touch intact in all 4 extremities.  Very hard of hearing.  Skin: Skin is warm and dry.  Psychiatric: He has a normal mood and affect. His behavior is normal.  Nursing note and vitals reviewed.    ED Treatments / Results  Labs (all labs ordered are listed, but only abnormal results are displayed) Labs Reviewed  BASIC METABOLIC PANEL - Abnormal; Notable for the following components:      Result Value   Potassium 3.4 (*)    BUN 28 (*)    Creatinine, Ser 1.33 (*)    Calcium 8.4 (*)    GFR calc non Af Amer 46 (*)    GFR calc Af Amer 53 (*)    All other components within normal limits  CBC - Abnormal; Notable for the following components:   WBC 14.5 (*)    All other components within normal limits  URINALYSIS, ROUTINE W REFLEX MICROSCOPIC - Abnormal; Notable for the following components:   APPearance CLOUDY (*)    Nitrite POSITIVE (*)    Leukocytes, UA SMALL (*)    All other components within normal limits  URINALYSIS,  MICROSCOPIC (REFLEX) - Abnormal; Notable for the following components:   Bacteria, UA MANY (*)    Squamous Epithelial / LPF 0-5 (*)    All other components within normal limits  URINE CULTURE  CULTURE, BLOOD (ROUTINE X 2)  CULTURE, BLOOD (ROUTINE X 2)  CBG MONITORING, ED  I-STAT CG4 LACTIC ACID, ED  I-STAT CG4 LACTIC ACID, ED    EKG  EKG Interpretation  Date/Time:  Saturday June 23 2017  19:52:09 EST Ventricular Rate:  68 PR Interval:    QRS Duration: 172 QT Interval:  425 QTC Calculation: 452 R Axis:   21 Text Interpretation:  Sinus rhythm Right bundle branch block No significant change since last tracing Confirmed by Quintella Reichert [phone]) on 06/23/2017 8:12:15 PM       Radiology Dg Chest 2 View  Result Date: 06/23/2017 CLINICAL DATA:  Cough and weakness EXAM: CHEST  2 VIEW COMPARISON:  02/20/16 FINDINGS: Cardiac shadow is within normal limits. Aortic calcifications are again noted and stable. Chronic scarring is noted in the bases posteriorly stable from prior exam. No new focal infiltrate is seen. Degenerative changes of thoracic spine are noted. IMPRESSION: Chronic basilar scarring.  No acute abnormality seen. Electronically Signed   By: Inez Catalina M.D.   On: 06/23/2017 20:52   Ct Renal Stone Study  Result Date: 06/23/2017 CLINICAL DATA:  Flank pain. EXAM: CT ABDOMEN AND PELVIS WITHOUT CONTRAST TECHNIQUE: Multidetector CT imaging of the abdomen and pelvis was performed following the standard protocol without IV contrast. COMPARISON:  December 19, 2010 FINDINGS: Lower chest: There is focal infiltrate in the posterior right lower lobe. There is a small hiatal hernia. No other abnormalities seen in the lung bases. Hepatobiliary: Cholelithiasis is identified without wall thickening or or adjacent fat stranding. No other acute abnormalities are identified in the liver. Pancreas: Unremarkable. No pancreatic ductal dilatation or surrounding inflammatory changes. Spleen: Normal in size without focal abnormality. Adrenals/Urinary Tract: Adrenal glands are normal. A large parapelvic cysts on the right was better appreciated on the contrast-enhanced study from 2012. No hydronephrosis seen bilaterally. The ureters are normal in caliber with no stones. There is a cyst off the posterior left kidney. No suspicious masses. No stones either kidney. Mild symmetric perinephric stranding is chronic.  The bladder is unremarkable. Stomach/Bowel: The stomach and small bowel are normal. There is a rectocele to the left. Colonic diverticulosis is seen without diverticulitis. There is moderate to severe fecal loading throughout the colon. The visualized appendix is normal. Vascular/Lymphatic: Atherosclerotic changes seen in the nonaneurysmal aorta, iliac vessels common femoral vessels. No adenopathy. Reproductive: The prostate is enlarged, exerting mass effect on the posterior bladder. Other: No abdominal wall hernia or abnormality. No abdominopelvic ascites. Musculoskeletal: There is an acute compression fracture T12, extending to the posterior aspect of the vertebral body. No extension identified into the pedicles. Minimal bulging along the posterior aspect of the vertebral body. Approximately 40% loss of body height identified. IMPRESSION: 1. Acute compression fracture of T12 with approximately 40% loss of height. There is minimal posterior bulging along the posterior aspect of the vertebral body. No extension into the pedicles identified. 2. Focal infiltrate in the right lung base may represent pneumonia. Recommend follow-up to resolution. 3. Cholelithiasis. 4. No renal stones or obstruction. 5. Small left rectocele. Colonic diverticulosis. Moderate fecal loading throughout the colon. 6. Atherosclerotic changes in the abdominal aorta. 7. Enlarged prostate. Electronically Signed   By: Dorise Bullion III M.D   On: 06/23/2017 23:05  Procedures Procedures (including critical care time)  Medications Ordered in ED Medications  sodium chloride 0.9 % bolus 500 mL (0 mLs Intravenous Stopped 06/23/17 2205)  HYDROcodone-acetaminophen (NORCO/VICODIN) 5-325 MG per tablet 1 tablet (1 tablet Oral Given 06/23/17 2200)  cefTRIAXone (ROCEPHIN) 1 g in dextrose 5 % 50 mL IVPB (0 g Intravenous Stopped 06/23/17 2306)  azithromycin (ZITHROMAX) tablet 500 mg (500 mg Oral Given 06/24/17 0033)     Initial Impression /  Assessment and Plan / ED Course  I have reviewed the triage vital signs and the nursing notes.  Pertinent labs & imaging results that were available during my care of the patient were reviewed by me and considered in my medical decision making (see chart for details).     Patient with recent T12 fracture here with progressive generalized weakness and poor oral intake.  He is nontoxic appearing on examination.  CBC with mild leukocytosis and UA is consistent with UTI.  CT renal stone obtained due to patient's complaint of right flank pain.  CT is negative for obstructing stone but does demonstrate right lower lobe pneumonia.  He was treated for both UTI and community acquired pneumonia with antibiotics and IV fluids.  Hospitalist consulted for admission for UTI and pneumonia oral intake.  Final Clinical Impressions(s) / ED Diagnoses   Final diagnoses:  None    ED Discharge Orders    None       Quintella Reichert, MD 06/24/17 [phone]

## 2017-06-24 ENCOUNTER — Other Ambulatory Visit: Payer: Self-pay

## 2017-06-24 ENCOUNTER — Encounter (HOSPITAL_COMMUNITY): Payer: Self-pay

## 2017-06-24 DIAGNOSIS — R531 Weakness: Secondary | ICD-10-CM

## 2017-06-24 DIAGNOSIS — K5900 Constipation, unspecified: Secondary | ICD-10-CM | POA: Diagnosis present

## 2017-06-24 DIAGNOSIS — Z96652 Presence of left artificial knee joint: Secondary | ICD-10-CM | POA: Diagnosis present

## 2017-06-24 DIAGNOSIS — M549 Dorsalgia, unspecified: Secondary | ICD-10-CM | POA: Diagnosis not present

## 2017-06-24 DIAGNOSIS — J449 Chronic obstructive pulmonary disease, unspecified: Secondary | ICD-10-CM | POA: Diagnosis not present

## 2017-06-24 DIAGNOSIS — E43 Unspecified severe protein-calorie malnutrition: Secondary | ICD-10-CM | POA: Diagnosis present

## 2017-06-24 DIAGNOSIS — B965 Pseudomonas (aeruginosa) (mallei) (pseudomallei) as the cause of diseases classified elsewhere: Secondary | ICD-10-CM | POA: Diagnosis present

## 2017-06-24 DIAGNOSIS — Z85820 Personal history of malignant melanoma of skin: Secondary | ICD-10-CM | POA: Diagnosis not present

## 2017-06-24 DIAGNOSIS — N400 Enlarged prostate without lower urinary tract symptoms: Secondary | ICD-10-CM | POA: Diagnosis present

## 2017-06-24 DIAGNOSIS — J189 Pneumonia, unspecified organism: Secondary | ICD-10-CM | POA: Diagnosis not present

## 2017-06-24 DIAGNOSIS — S22080A Wedge compression fracture of T11-T12 vertebra, initial encounter for closed fracture: Secondary | ICD-10-CM | POA: Diagnosis present

## 2017-06-24 DIAGNOSIS — Z77090 Contact with and (suspected) exposure to asbestos: Secondary | ICD-10-CM | POA: Diagnosis present

## 2017-06-24 DIAGNOSIS — J440 Chronic obstructive pulmonary disease with acute lower respiratory infection: Secondary | ICD-10-CM | POA: Diagnosis present

## 2017-06-24 DIAGNOSIS — W1830XD Fall on same level, unspecified, subsequent encounter: Secondary | ICD-10-CM | POA: Diagnosis not present

## 2017-06-24 DIAGNOSIS — F329 Major depressive disorder, single episode, unspecified: Secondary | ICD-10-CM | POA: Diagnosis present

## 2017-06-24 DIAGNOSIS — A419 Sepsis, unspecified organism: Secondary | ICD-10-CM | POA: Diagnosis not present

## 2017-06-24 DIAGNOSIS — H9190 Unspecified hearing loss, unspecified ear: Secondary | ICD-10-CM | POA: Diagnosis present

## 2017-06-24 DIAGNOSIS — I129 Hypertensive chronic kidney disease with stage 1 through stage 4 chronic kidney disease, or unspecified chronic kidney disease: Secondary | ICD-10-CM | POA: Diagnosis present

## 2017-06-24 DIAGNOSIS — M4854XD Collapsed vertebra, not elsewhere classified, thoracic region, subsequent encounter for fracture with routine healing: Secondary | ICD-10-CM | POA: Diagnosis present

## 2017-06-24 DIAGNOSIS — J181 Lobar pneumonia, unspecified organism: Secondary | ICD-10-CM | POA: Diagnosis present

## 2017-06-24 DIAGNOSIS — N390 Urinary tract infection, site not specified: Secondary | ICD-10-CM | POA: Diagnosis present

## 2017-06-24 DIAGNOSIS — N3000 Acute cystitis without hematuria: Secondary | ICD-10-CM | POA: Diagnosis not present

## 2017-06-24 DIAGNOSIS — G629 Polyneuropathy, unspecified: Secondary | ICD-10-CM | POA: Diagnosis present

## 2017-06-24 DIAGNOSIS — M1711 Unilateral primary osteoarthritis, right knee: Secondary | ICD-10-CM | POA: Diagnosis present

## 2017-06-24 DIAGNOSIS — R350 Frequency of micturition: Secondary | ICD-10-CM | POA: Diagnosis present

## 2017-06-24 DIAGNOSIS — N183 Chronic kidney disease, stage 3 (moderate): Secondary | ICD-10-CM | POA: Diagnosis present

## 2017-06-24 DIAGNOSIS — J309 Allergic rhinitis, unspecified: Secondary | ICD-10-CM | POA: Diagnosis present

## 2017-06-24 DIAGNOSIS — E785 Hyperlipidemia, unspecified: Secondary | ICD-10-CM | POA: Diagnosis present

## 2017-06-24 DIAGNOSIS — G4730 Sleep apnea, unspecified: Secondary | ICD-10-CM | POA: Diagnosis present

## 2017-06-24 DIAGNOSIS — Z6820 Body mass index (BMI) 20.0-20.9, adult: Secondary | ICD-10-CM | POA: Diagnosis not present

## 2017-06-24 DIAGNOSIS — M810 Age-related osteoporosis without current pathological fracture: Secondary | ICD-10-CM | POA: Diagnosis present

## 2017-06-24 DIAGNOSIS — G2581 Restless legs syndrome: Secondary | ICD-10-CM | POA: Diagnosis present

## 2017-06-24 LAB — BASIC METABOLIC PANEL
Anion gap: 9 (ref 5–15)
BUN: 25 mg/dL — AB (ref 6–20)
CALCIUM: 8.1 mg/dL — AB (ref 8.9–10.3)
CO2: 24 mmol/L (ref 22–32)
CREATININE: 1.3 mg/dL — AB (ref 0.61–1.24)
Chloride: 106 mmol/L (ref 101–111)
GFR, EST AFRICAN AMERICAN: 54 mL/min — AB (ref >60)
GFR, EST NON AFRICAN AMERICAN: 47 mL/min — AB (ref >60)
Glucose, Bld: 74 mg/dL (ref 65–99)
Potassium: 3.6 mmol/L (ref 3.5–5.1)
SODIUM: 139 mmol/L (ref 135–145)

## 2017-06-24 LAB — CBC
HCT: 40.1 % (ref 39.0–52.0)
Hemoglobin: 13.4 g/dL (ref 13.0–17.0)
MCH: 31.6 pg (ref 26.0–34.0)
MCHC: 33.4 g/dL (ref 30.0–36.0)
MCV: 94.6 fL (ref 78.0–100.0)
PLATELETS: 271 10*3/uL (ref 150–400)
RBC: 4.24 MIL/uL (ref 4.22–5.81)
RDW: 13.9 % (ref 11.5–15.5)
WBC: 12.5 10*3/uL — ABNORMAL HIGH (ref 4.0–10.5)

## 2017-06-24 LAB — PROCALCITONIN

## 2017-06-24 MED ORDER — POLYETHYLENE GLYCOL 3350 17 G PO PACK
17.0000 g | PACK | ORAL | Status: DC
  Administered 2017-06-24 – 2017-06-26 (×2): 17 g via ORAL
  Filled 2017-06-24: qty 1

## 2017-06-24 MED ORDER — BUPROPION HCL ER (SR) 100 MG PO TB12
200.0000 mg | ORAL_TABLET | Freq: Two times a day (BID) | ORAL | Status: DC
  Administered 2017-06-24 – 2017-06-27 (×7): 200 mg via ORAL
  Filled 2017-06-24 (×7): qty 2

## 2017-06-24 MED ORDER — HYDROCODONE-ACETAMINOPHEN 5-325 MG PO TABS: 1.0000 | ORAL_TABLET | Freq: Two times a day (BID) | ORAL | Status: DC | PRN

## 2017-06-24 MED ORDER — ENSURE ENLIVE PO LIQD
237.0000 mL | Freq: Two times a day (BID) | ORAL | Status: DC
  Administered 2017-06-25: 237 mL via ORAL

## 2017-06-24 MED ORDER — FLUTICASONE PROPIONATE 50 MCG/ACT NA SUSP
2.0000 | Freq: Two times a day (BID) | NASAL | Status: DC | PRN
  Filled 2017-06-24: qty 16

## 2017-06-24 MED ORDER — BENZONATATE 100 MG PO CAPS: 200.0000 mg | ORAL_CAPSULE | Freq: Two times a day (BID) | ORAL | Status: DC | PRN

## 2017-06-24 MED ORDER — CEFTRIAXONE SODIUM 2 G IJ SOLR
2.0000 g | INTRAMUSCULAR | Status: DC
  Administered 2017-06-24 – 2017-06-25 (×2): 2 g via INTRAVENOUS
  Filled 2017-06-24 (×2): qty 2

## 2017-06-24 MED ORDER — MONTELUKAST SODIUM 10 MG PO TABS
10.0000 mg | ORAL_TABLET | Freq: Every day | ORAL | Status: DC
  Administered 2017-06-24 – 2017-06-27 (×4): 10 mg via ORAL
  Filled 2017-06-24 (×4): qty 1

## 2017-06-24 MED ORDER — ALBUTEROL SULFATE (2.5 MG/3ML) 0.083% IN NEBU: 2.5000 mg | INHALATION_SOLUTION | Freq: Four times a day (QID) | RESPIRATORY_TRACT | Status: DC | PRN

## 2017-06-24 MED ORDER — ROPINIROLE HCL 1 MG PO TABS
1.0000 mg | ORAL_TABLET | Freq: Two times a day (BID) | ORAL | Status: DC
  Administered 2017-06-24 – 2017-06-27 (×7): 1 mg via ORAL
  Filled 2017-06-24 (×7): qty 1

## 2017-06-24 MED ORDER — SODIUM CHLORIDE 0.9 % IV SOLN
Freq: Once | INTRAVENOUS | Status: AC
  Administered 2017-06-24: 75 mL/h via INTRAVENOUS

## 2017-06-24 MED ORDER — ALBUTEROL SULFATE HFA 108 (90 BASE) MCG/ACT IN AERS: 2.0000 | INHALATION_SPRAY | Freq: Four times a day (QID) | RESPIRATORY_TRACT | Status: DC | PRN

## 2017-06-24 MED ORDER — RISAQUAD PO CAPS
1.0000 | ORAL_CAPSULE | Freq: Every day | ORAL | Status: DC
  Administered 2017-06-24 – 2017-06-27 (×4): 1 via ORAL
  Filled 2017-06-24 (×4): qty 1

## 2017-06-24 MED ORDER — HYDROCODONE-ACETAMINOPHEN 5-325 MG PO TABS
0.5000 | ORAL_TABLET | Freq: Four times a day (QID) | ORAL | Status: DC | PRN
  Administered 2017-06-24 – 2017-06-26 (×5): 0.5 via ORAL
  Filled 2017-06-24 (×5): qty 1

## 2017-06-24 MED ORDER — HYDROCODONE-ACETAMINOPHEN 5-325 MG PO TABS
0.5000 | ORAL_TABLET | Freq: Two times a day (BID) | ORAL | Status: DC | PRN
  Administered 2017-06-24: 0.5 via ORAL
  Filled 2017-06-24: qty 1

## 2017-06-24 MED ORDER — MOMETASONE FURO-FORMOTEROL FUM 200-5 MCG/ACT IN AERO
2.0000 | INHALATION_SPRAY | Freq: Two times a day (BID) | RESPIRATORY_TRACT | Status: DC
  Administered 2017-06-24 – 2017-06-27 (×7): 2 via RESPIRATORY_TRACT
  Filled 2017-06-24: qty 8.8

## 2017-06-24 MED ORDER — GUAIFENESIN ER 600 MG PO TB12
600.0000 mg | ORAL_TABLET | Freq: Two times a day (BID) | ORAL | Status: DC
  Administered 2017-06-24 – 2017-06-27 (×8): 600 mg via ORAL
  Filled 2017-06-24 (×8): qty 1

## 2017-06-24 MED ORDER — TAMSULOSIN HCL 0.4 MG PO CAPS
0.4000 mg | ORAL_CAPSULE | Freq: Every day | ORAL | Status: DC
  Administered 2017-06-24 – 2017-06-27 (×4): 0.4 mg via ORAL
  Filled 2017-06-24 (×4): qty 1

## 2017-06-24 MED ORDER — ONDANSETRON HCL 4 MG/2ML IJ SOLN
4.0000 mg | Freq: Four times a day (QID) | INTRAMUSCULAR | Status: DC | PRN
  Administered 2017-06-24 (×2): 4 mg via INTRAVENOUS
  Filled 2017-06-24 (×2): qty 2

## 2017-06-24 MED ORDER — DEXTROSE 5 % IV SOLN
500.0000 mg | INTRAVENOUS | Status: DC
  Administered 2017-06-24: 500 mg via INTRAVENOUS
  Filled 2017-06-24 (×2): qty 500

## 2017-06-24 MED ORDER — SIMVASTATIN 40 MG PO TABS
40.0000 mg | ORAL_TABLET | Freq: Every day | ORAL | Status: DC
  Administered 2017-06-24 – 2017-06-27 (×4): 40 mg via ORAL
  Filled 2017-06-24 (×4): qty 1

## 2017-06-24 MED ORDER — POTASSIUM CHLORIDE CRYS ER 20 MEQ PO TBCR
20.0000 meq | EXTENDED_RELEASE_TABLET | Freq: Once | ORAL | Status: AC
  Administered 2017-06-24: 20 meq via ORAL
  Filled 2017-06-24: qty 1

## 2017-06-24 NOTE — Progress Notes (Signed)
PT Cancellation Note  Patient Details Name: Micheal Frank MRN: [id number] DOB: 1925-08-14   Cancelled Treatment:     Spoke to patient who requested not get up again right now due to having be on [facility] with nursing staff with difficulty with having a BM and urinating, so he is very fatigued and wishes to rest. Will attempt eval tomorrow.   Pt stated he lives at home with his daughter and grandson , and he is home alone during the day, uses his cane on the steps and his walker in the house. (unsure of accuracy of this, will check with daughter).    Clide Dales 06/24/2017, 1:20 PM  Clide Dales, PT Pager: [phone] 06/24/2017

## 2017-06-24 NOTE — H&P (Signed)
History and Physical    [name]:[id number] DOB: [birth year] DOA: 06/23/2017  Referring MD/NP/PA: Dr. Ralene Bathe PCP: Colon Branch, MD  Patient coming from: [facility] for  Chief Complaint: Weakness  I have personally briefly reviewed patient's old medical records in [place]   HPI: Micheal Frank is a 83 y.o. male with medical history significant of COPD, CKD stage III, SDH, depression, mild memory loss, and BPH; who presents with complaints of progressively generalized weakness and poor appetite over the last 3 days.  Patient is hard of hearing, and his daughter helps him provide history.  Patient reports that he is not felt well since having his fall.  Patient reports having a fall where he fractured T12 of his back  approximately 2 weeks ago, but daughter notes that it has been almost 1 month.  Since that time he has been on hydrocodone with only mild relief of symptoms.  Patient has been complaining of severe back pain daughter feels is been unchanged.  With the hydrocodone patient sleeps more, but over the last few days has been more lethargic.  Family had been alternating Tylenol and hydrocodone without much difference patient sleepiness.  He had not been eating or drinking much.  Daughter states that he had approximately 10 pound weight loss and have become very unsteady on his feet.  Denies any other falls, dysuria, frequency, chest pain, or fevers.  Associated symptoms include chronic productive sounding cough that patient reported was possibly worse, but family feels is about the same.  He normally is constipated, but alternates Metamucil and MiraLAX to keep him regular.  ED Course: Upon admission into the emergency department patient was seen to be afebrile with vital signs relatively within normal limits.  Labs revealed WBC 14.5, potassium 3.4, BUN 28, creatinine 1.33.  Urinalysis was positive for small leukocytes, nitrites, WBCs, and many bacteria.  A CT angiogram was performed  for complaints of flank pain.  CT scan revealed old T12 compression fracture, focal infiltrate in the right lung base concerning for possible pneumonia, cholelithiasis, and small left rectocele, diverticulosis, and enlarged prostate.  Patient was given empiric antibiotics of ceftriaxone and azithromycin.  Review of Systems  Constitutional: Positive for malaise/fatigue and weight loss. Negative for chills and fever.  HENT: Positive for ear pain (Right ear) and hearing loss.   Eyes: Negative for photophobia and pain.  Respiratory: Positive for cough. Negative for sputum production, shortness of breath and wheezing.   Cardiovascular: Negative for chest pain, claudication and leg swelling.  Gastrointestinal: Positive for constipation. Negative for abdominal pain, diarrhea, nausea and vomiting.  Genitourinary: Negative for dysuria and frequency.  Musculoskeletal: Positive for back pain. Negative for falls.  Skin: Negative for rash.  Neurological: Positive for weakness. Negative for focal weakness and loss of consciousness.       Positive for unsteady gait  Endo/Heme/Allergies: Bruises/bleeds easily.  Psychiatric/Behavioral: Positive for memory loss (Mild). Negative for substance abuse.    Past Medical History:  Diagnosis Date  . Arthritis    left leg also has swelling  . Asthma   . Blood dyscrasia    pt CAN NOT have any blood thinners d/t hx brain bleed  . Chronic kidney disease, stage III (moderate) ([place]) 01/20/2013  . Complication of anesthesia    confusion with anesthesia  . Constipation    miralax prn  . COPD (chronic obstructive pulmonary disease) ([facility])    emphysema  . Depression    takes wellbutrin bid  .  Epidural abscess   . Gastric ulcer   . History of blood clots   . Hyperlipidemia   . Melanoma ([place])   . Peripheral neuropathy   . Pneumonia    as a child and in 1952;had a pneumonia vaccine couple of years ago  . Prosthetic joint infection ([place])   . S/P right heart  catheterization    at least 71yrs  . Shingles   . Short-term memory loss   . Shortness of breath    with exertion  . Sleep apnea    has CPAP but doesn't use it;sleep study done at least 52yrs ago  . Staphylococcus aureus bacteremia with sepsis ([place])   . Subdural hematoma ([place]) 03/30/11  . Urinary frequency    wears depends    Past Surgical History:  Procedure Laterality Date  . epidural abscess drainage  12/2010  . 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 EYE SURGERY     bil cataract surgery  . FILTERING PROCEDURE     filter placed in neck d/t blood  clot  . HERNIA REPAIR     25+yrs ago  . KNEE ARTHROSCOPY    . SKIN BIOPSY     melanoma on head   . TOTAL KNEE ARTHROPLASTY     x 2 left knee 2003/2012  . TOTAL KNEE REVISION  05/23/2011   Procedure: TOTAL KNEE REVISION;  Surgeon: Meredith Pel;  Location: [facility];  Service: Orthopedics;  Laterality: Left;  LEFT KNEE REMOVAL OF SPACER, POSSIBLE REIMPLANTATION OF REVISION TKA VERSES REPLACEMENT ANTIBIOTIC SPACER     reports that he quit smoking about 38 years ago. His smoking use included cigarettes. He has a 80.00 pack-year smoking history. he has never used smokeless tobacco. He reports that he does not drink alcohol or use drugs.  Allergies  Allergen Reactions  . Anticoagulant Compound     History of bleeding on the brain per daughter  . Protonix [Pantoprazole Sodium] Diarrhea    Family History  Problem Relation Age of Onset  . Melanoma Brother   . CAD Neg Hx   . Diabetes Neg Hx     Prior to Admission medications   Medication Sig Start Date End Date Taking? Authorizing Provider  acetaminophen (TYLENOL) 325 MG tablet Take 2 tablets (650 mg total) by mouth every 6 (six) hours as needed for mild pain (or Fever >/= 101). 02/22/16   Regalado, Belkys A, MD  albuterol (PROAIR HFA) 108 (90 BASE) MCG/ACT inhaler Inhale 2 puffs into the lungs every 6 (six) hours as needed for wheezing. 07/31/14   Elsie Stain, MD  benzonatate (TESSALON) 200 MG capsule TAKE ONE CAPSULE 3-4 TIMES DAILY AS NEEDED FOR COUGH Patient taking differently: Take 200 mg by mouth 2 (two) times daily as needed for cough. TAKE ONE CAPSULE 3-4 TIMES DAILY AS NEEDED FOR COUGH 08/16/15   Tanda Rockers, MD  budesonide-formoterol [facility]) 160-4.5 MCG/ACT inhaler Inhale 2 puffs into the lungs 2 (two) times daily.    [provider]  buPROPion (WELLBUTRIN SR) 200 MG 12 hr tablet Take 200 mg by mouth 2 (two) times daily.    [provider]  calcium carbonate (OS-CAL) 600 MG TABS Take 600 mg by mouth daily.     [provider]  Cranberry-Vitamin C (AZO CRANBERRY URINARY TRACT) 250-60 MG CAPS Take 1 tablet by mouth daily.    [provider]  diazepam (VALIUM) 5 MG tablet  Take 1 tablet (5 mg total) by mouth every 6 (six) hours as needed for anxiety. 03/01/16   Colon Branch, MD  doxazosin (CARDURA) 4 MG tablet Take 1 tablet (4 mg total) by mouth at bedtime. 11/06/16   Colon Branch, MD  fluticasone [facility]) 50 MCG/ACT nasal spray Place 2 sprays into the nose 2 (two) times daily. 08/08/12   Elsie Stain, MD  HYDROcodone-acetaminophen (NORCO/VICODIN) 5-325 MG tablet Take 1 tablet every 12 hours as needed for pain. 06/05/17   Colon Branch, MD  meclizine (ANTIVERT) 25 MG tablet Take 1 tablet (25 mg total) by mouth daily as needed (vertigo). 03/01/16   Colon Branch, MD  montelukast (SINGULAIR) 10 MG tablet Take 1 tablet (10 mg total) by mouth daily. 09/20/16   Colon Branch, MD  Multiple Vitamin (MULTIVITAMIN WITH MINERALS) TABS Take 1 tablet by mouth daily.    [provider]  rOPINIRole (REQUIP) 1 MG tablet Take 1 tablet (1 mg total) by mouth 2 (two) times daily. 11/08/16   Colon Branch, MD  saccharomyces boulardii (FLORASTOR) 250 MG capsule Take 1 capsule (250 mg total) by mouth 2 (two) times daily. 03/09/16   Colon Branch, MD  simvastatin (ZOCOR) 40 MG tablet Take 1 tablet (40 mg total) by mouth  daily. 09/20/16   Colon Branch, MD  tamsulosin (FLOMAX) 0.4 MG CAPS capsule Take 1 capsule (0.4 mg total) by mouth daily. 09/20/16   Colon Branch, MD  triamterene-hydrochlorothiazide (MAXZIDE-25) 37.5-25 MG tablet Take 0.5 tablets by mouth daily. 11/10/16   Colon Branch, MD  URELLE (URELLE/URISED) 81 MG TABS tablet Take 1 tablet (81 mg total) by mouth every 6 (six) hours as needed for bladder spasms. 02/22/16   Regalado, Cassie Freer, MD    Physical Exam:  Constitutional: Elderly male in NAD, calm, comfortable Vitals:   06/23/17 1908 06/23/17 1909 06/24/17 0026  BP:  (!) 144/85 123/69  Pulse:  77 69  Resp:  (!) 24 20  Temp:  98.1 F (36.7 C) 98.7 F (37.1 C)  TempSrc:  Oral Oral  SpO2:  97% 98%  Weight: 65.8 kg (145 lb)    Height: 5\' 10"  (1.778 m)     Eyes: Left pupil significantly bigger than right(patient notes that this is not new), lids and conjunctivae normal ENMT: Mucous membranes are moist. Posterior pharynx clear of any exudate or lesions. Patient hard of hearing and left ear is better than the right Neck: normal, supple, no masses, no thyromegaly Respiratory: Decreased overall aeration, but no signs of wheezes or rhonchi appreciated. cardiovascular: Regular rate and rhythm, no murmurs / rubs / gallops. No extremity edema. 2+ pedal pulses. No carotid bruits.  Abdomen: no tenderness, no masses palpated. No hepatosplenomegaly. Bowel sounds positive.  Musculoskeletal: no clubbing / cyanosis.  Tenderness to palpation lower spine. Skin: no rashes, lesions, ulcers. No induration Neurologic: CN 2-12 grossly intact. Sensation intact, DTR normal. Strength 4-5 /5 in all 4.  Psychiatric: Normal judgment and insight. Alert and oriented x 3. Normal mood.     Labs on Admission: I have personally reviewed following labs and imaging studies  CBC: Recent Labs  Lab 06/23/17 1945  WBC 14.5*  HGB 13.1  HCT 39.9  MCV 94.5  PLT 761   Basic Metabolic Panel: Recent Labs  Lab 06/23/17 1945  NA  139  K 3.4*  CL 107  CO2 24  GLUCOSE 88  BUN 28*  CREATININE 1.33*  CALCIUM 8.4*  GFR: Estimated Creatinine Clearance: 35 mL/min (A) (by C-G formula based on SCr of 1.33 mg/dL (H)). Liver Function Tests: No results for input(s): AST, ALT, ALKPHOS, BILITOT, PROT, ALBUMIN in the last 168 hours. No results for input(s): LIPASE, AMYLASE in the last 168 hours. No results for input(s): AMMONIA in the last 168 hours. Coagulation Profile: No results for input(s): INR, PROTIME in the last 168 hours. Cardiac Enzymes: No results for input(s): CKTOTAL, CKMB, CKMBINDEX, TROPONINI in the last 168 hours. BNP (last 3 results) No results for input(s): PROBNP in the last 8760 hours. HbA1C: No results for input(s): HGBA1C in the last 72 hours. CBG: Recent Labs  Lab 06/23/17 1959  GLUCAP 90   Lipid Profile: No results for input(s): CHOL, HDL, LDLCALC, TRIG, CHOLHDL, LDLDIRECT in the last 72 hours. Thyroid Function Tests: No results for input(s): TSH, T4TOTAL, FREET4, T3FREE, THYROIDAB in the last 72 hours. Anemia Panel: No results for input(s): VITAMINB12, FOLATE, FERRITIN, TIBC, IRON, RETICCTPCT in the last 72 hours. Urine analysis:    Component Value Date/Time   COLORURINE YELLOW 06/23/2017 2110   APPEARANCEUR CLOUDY (A) 06/23/2017 2110   LABSPEC 1.020 06/23/2017 2110   PHURINE 7.5 06/23/2017 2110   GLUCOSEU NEGATIVE 06/23/2017 2110   HGBUR NEGATIVE 06/23/2017 2110   BILIRUBINUR NEGATIVE 06/23/2017 2110   KETONESUR NEGATIVE 06/23/2017 2110   PROTEINUR NEGATIVE 06/23/2017 2110   UROBILINOGEN 1.0 01/23/2013 0915   NITRITE POSITIVE (A) 06/23/2017 2110   LEUKOCYTESUR SMALL (A) 06/23/2017 2110   Sepsis Labs: No results found for this or any previous visit (from the past 240 hour(s)).   Radiological Exams on Admission: Dg Chest 2 View  Result Date: 06/23/2017 CLINICAL DATA:  Cough and weakness EXAM: CHEST  2 VIEW COMPARISON:  02/20/16 FINDINGS: Cardiac shadow is within normal limits.  Aortic calcifications are again noted and stable. Chronic scarring is noted in the bases posteriorly stable from prior exam. No new focal infiltrate is seen. Degenerative changes of thoracic spine are noted. IMPRESSION: Chronic basilar scarring.  No acute abnormality seen. Electronically Signed   By: Inez Catalina M.D.   On: 06/23/2017 20:52   Ct Renal Stone Study  Result Date: 06/23/2017 CLINICAL DATA:  Flank pain. EXAM: CT ABDOMEN AND PELVIS WITHOUT CONTRAST TECHNIQUE: Multidetector CT imaging of the abdomen and pelvis was performed following the standard protocol without IV contrast. COMPARISON:  December 19, 2010 FINDINGS: Lower chest: There is focal infiltrate in the posterior right lower lobe. There is a small hiatal hernia. No other abnormalities seen in the lung bases. Hepatobiliary: Cholelithiasis is identified without wall thickening or or adjacent fat stranding. No other acute abnormalities are identified in the liver. Pancreas: Unremarkable. No pancreatic ductal dilatation or surrounding inflammatory changes. Spleen: Normal in size without focal abnormality. Adrenals/Urinary Tract: Adrenal glands are normal. A large parapelvic cysts on the right was better appreciated on the contrast-enhanced study from 2012. No hydronephrosis seen bilaterally. The ureters are normal in caliber with no stones. There is a cyst off the posterior left kidney. No suspicious masses. No stones either kidney. Mild symmetric perinephric stranding is chronic. The bladder is unremarkable. Stomach/Bowel: The stomach and small bowel are normal. There is a rectocele to the left. Colonic diverticulosis is seen without diverticulitis. There is moderate to severe fecal loading throughout the colon. The visualized appendix is normal. Vascular/Lymphatic: Atherosclerotic changes seen in the nonaneurysmal aorta, iliac vessels common femoral vessels. No adenopathy. Reproductive: The prostate is enlarged, exerting mass effect on the posterior  bladder. Other:  No abdominal wall hernia or abnormality. No abdominopelvic ascites. Musculoskeletal: There is an acute compression fracture T12, extending to the posterior aspect of the vertebral body. No extension identified into the pedicles. Minimal bulging along the posterior aspect of the vertebral body. Approximately 40% loss of body height identified. IMPRESSION: 1. Acute compression fracture of T12 with approximately 40% loss of height. There is minimal posterior bulging along the posterior aspect of the vertebral body. No extension into the pedicles identified. 2. Focal infiltrate in the right lung base may represent pneumonia. Recommend follow-up to resolution. 3. Cholelithiasis. 4. No renal stones or obstruction. 5. Small left rectocele. Colonic diverticulosis. Moderate fecal loading throughout the colon. 6. Atherosclerotic changes in the abdominal aorta. 7. Enlarged prostate. Electronically Signed   By: Dorise Bullion III M.D   On: 06/23/2017 23:05    EKG: Independently reviewed.  Sinus rhythm with RBBB unchanged from previous EKG tracings.  Assessment/Plan Possible SIRS/sepsis 2/2 urinary tract infection, possible pneumonia: Acute.  Patient presents with complaints of generalized weakness and poor appetite.  Labs revealed WBC 14.5 with some tachypnea, but lactic acid appears reassuring at 1.17.  The patient's urinalysis was suggestive of a UTI.  CT of the abdomen and pelvis revealed possible signs of possible pneumonia. - Admit to a MedSurg bed - Follow-up urine, blood, and sputum studies - Continue empiric antibiotics of Rocephin and azithromycin - repeat CBC in a.m.  Generalized weakness: Patient with decreased oral intake likely contributing.  Suspect secondary to the above.   - Physical therapy to eval and treat    Compression fracture T12, osteoporosis: Patient reportedly fell a couple weeks ago and was found to have a T12 fracture that is being treated with hydrocodone.  Question  and hydrocodone was making the patient significantly lethargic.  Patient on Prolia. - Decreased hydrocodone dose in half  COPD, without acute exacerbation: Family notes chronic cough that they feel is unchanged. - Continue pharmacy substitution Dulera for Symbicort , and albuterol nebs prn, and Singulair - Mucinex - Tessalon Perles prn cough  Chronic kidney disease stage III: Patient presents with a creatinine of 1.33 and BUN 28.  This appears improved from baseline creatinine which appears to be around 1.5-1.6. - Continue to monitor  Essential hypertension:  - Follow-up why was not taking Maxide and Cardura last few days  Depression - Continue Wellbutrin  History of SDH: Family notes that he cannot be on any kind of blood thinners.  BPH - Continue Flomax  DVT prophylaxis: SCD    Code Status: Full  Family Communication: Discussed plan of care with the patient family present Disposition Plan: TBD Consults called: None Admission status: Patient  Norval Morton MD [facility] Pager [phone]   If 7PM-7AM, please contact night-coverage [email or web address] Password [email or web address]  06/24/2017, 12:46 AM

## 2017-06-24 NOTE — Progress Notes (Signed)
PHARMACY NOTE -  ANTIBIOTIC RENAL DOSE ADJUSTMENT   Request received for Pharmacy to assist with antibiotic renal dose adjustment.  Patient has been initiated on Azithromycin 500mg  iv q24hr Ceftriaxone 2gm iv q24hr  for CAP. SCr 1.33, estimated CrCl 25ml/min Current dosage is appropriate and need for further dosage adjustment appears unlikely at present. Will sign off at this time.  Please reconsult if a change in clinical status warrants re-evaluation of dosage.

## 2017-06-24 NOTE — Progress Notes (Signed)
Micheal Frank is a 83 y.o. male with medical history significant of COPD, CKD stage III, SDH, depression, mild memory loss, and BPH; who presents with complaints of progressively generalized weakness and poor appetite over the last 3 days.  He was admitted for possible sepsis from UTI, pneumonia.    Plan:  IV antibiotics.  Pain control for the T12 fracture and  Physical therapy.    Hosie Poisson, MD [phone]

## 2017-06-24 NOTE — ED Notes (Signed)
Attempted report x1. 

## 2017-06-24 NOTE — ED Notes (Signed)
Pt given ice water per RN. 

## 2017-06-24 NOTE — ED Notes (Signed)
Pt assisted to bedside commode

## 2017-06-25 LAB — STREP PNEUMONIAE URINARY ANTIGEN: Strep Pneumo Urinary Antigen: NEGATIVE

## 2017-06-25 MED ORDER — LEVOFLOXACIN IN D5W 750 MG/150ML IV SOLN
750.0000 mg | INTRAVENOUS | Status: DC
  Administered 2017-06-25: 750 mg via INTRAVENOUS
  Filled 2017-06-25: qty 150

## 2017-06-25 MED ORDER — ENSURE ENLIVE PO LIQD
237.0000 mL | Freq: Three times a day (TID) | ORAL | Status: DC
  Administered 2017-06-25 – 2017-06-27 (×5): 237 mL via ORAL

## 2017-06-25 MED ORDER — HEPARIN SODIUM (PORCINE) 5000 UNIT/ML IJ SOLN: 5000.0000 [IU] | Freq: Three times a day (TID) | INTRAMUSCULAR | Status: DC

## 2017-06-25 NOTE — Progress Notes (Signed)
PROGRESS NOTE    Micheal Frank  UXL:[id number] DOB: 1925/12/30 DOA: 06/23/2017 PCP: Colon Branch, MD    Brief Narrative:Micheal C Smithis a 83 y.o.malewith medical history significant ofCOPD, CKDstage III, SDH, depression,mild memory loss,andBPH;who presents with complaints of progressively generalized weakness and poor appetite over the last 3 days.  He was admitted for possible sepsis from UTI, pneumonia and t 12 fracture. .     Assessment & Plan:    Active Problems:   RESTLESS LEG SYNDROME   COPD GOLD II    Chronic kidney disease, stage III (moderate) (HCC)   UTI (urinary tract infection)   Compression fracture of T12 vertebra (HCC)   Generalized weakness   Compression fracture of T12 vertebra: Pain control. IR consult for evaluation for kyphoplasty.  PT eval.    Right lower lobe pneumonia:  Admitted for IV antibiotics.  Blood cultures ordered and are negative.  [place] oxygen to keep sats greater than 90% No sputum culture yet.  Pro calcitonin and lactic acid negative.  Started on IV rocephin and zithromax and narrowed to levaquin to treat both pneumonia and UTI.  Strep pneumo urinary antigen is negative. Sepsis ruled out.  Afebrile and leukocytosis is improving.    Pseudomonas UTI:  Sensitivities are pending.  Changed antibiotic to levaquin to complete the course.    Copd; No wheezing , stable. Resume home meds.    Stage 3 CKD: Stable.    H/o SDH:  No signs of SDH.   Hyperlipidemia:  Resume zocor.    BPH: Resume flomax.   DVT prophylaxis:scd's. / heparin sq.  Code Status: (full code.  Family Communication: DISCUSSED with 3 family members at bedside.  Disposition Plan: pending evaluation by IR.   Consultants:   IR for evaluation of kyphoplasty.    Procedures: none   Antimicrobials: zithromax and rocephin since admission, later on transitioned to levaquin from 12/10.   Subjective: Reports having persistent back pain and is  not comfortable.   Objective: Vitals:   06/24/17 2056 06/25/17 0618 06/25/17 0834 06/25/17 1410  BP: 137/62 127/79  105/67  Pulse: 71 70  77  Resp: 20 19  18   Temp: 98.6 F (37 C) 98 F (36.7 C)  97.8 F (36.6 C)  TempSrc: Oral Oral  Oral  SpO2: 93% 96% 94% 97%  Weight:      Height:        Intake/Output Summary (Last 24 hours) at 06/25/2017 1713 Last data filed at 06/25/2017 1410 Gross per 24 hour  Intake 720 ml  Output 650 ml  Net 70 ml   Filed Weights   06/23/17 1908 06/24/17 0222  Weight: 65.8 kg (145 lb) 66.2 kg (145 lb 15.1 oz)    Examination:  General exam: Appears uncomfortable, in pain.  Respiratory system: scattered rhonchi on the rigth, no wheezing heard. Air entry fair.  Cardiovascular system: S1 & S2 heard, RRR. No JVD, murmurs,No pedal edema. Gastrointestinal system: abd is soft non tender non distended bowel sounds are good.  Central nervous system: confused. Non focal.  Extremities: Symmetric 5 x 5 power. No cyanosis or clubbing.  Skin: No rashes, lesions or ulcers Psychiatry:  Mood & affect appropriate.     Data Reviewed: I have personally reviewed following labs and imaging studies  CBC: Recent Labs  Lab 06/23/17 1945 06/24/17 0606  WBC 14.5* 12.5*  HGB 13.1 13.4  HCT 39.9 40.1  MCV 94.5 94.6  PLT 271 536   Basic Metabolic Panel: Recent Labs  Lab 06/23/17 1945 06/24/17 0606  NA 139 139  K 3.4* 3.6  CL 107 106  CO2 24 24  GLUCOSE 88 74  BUN 28* 25*  CREATININE 1.33* 1.30*  CALCIUM 8.4* 8.1*   GFR: Estimated Creatinine Clearance: 36.1 mL/min (A) (by C-G formula based on SCr of 1.3 mg/dL (H)). Liver Function Tests: No results for input(s): AST, ALT, ALKPHOS, BILITOT, PROT, ALBUMIN in the last 168 hours. No results for input(s): LIPASE, AMYLASE in the last 168 hours. No results for input(s): AMMONIA in the last 168 hours. Coagulation Profile: No results for input(s): INR, PROTIME in the last 168 hours. Cardiac Enzymes: No  results for input(s): CKTOTAL, CKMB, CKMBINDEX, TROPONINI in the last 168 hours. BNP (last 3 results) No results for input(s): PROBNP in the last 8760 hours. HbA1C: No results for input(s): HGBA1C in the last 72 hours. CBG: Recent Labs  Lab 06/23/17 1959  GLUCAP 90   Lipid Profile: No results for input(s): CHOL, HDL, LDLCALC, TRIG, CHOLHDL, LDLDIRECT in the last 72 hours. Thyroid Function Tests: No results for input(s): TSH, T4TOTAL, FREET4, T3FREE, THYROIDAB in the last 72 hours. Anemia Panel: No results for input(s): VITAMINB12, FOLATE, FERRITIN, TIBC, IRON, RETICCTPCT in the last 72 hours. Sepsis Labs: Recent Labs  Lab 06/23/17 2222 06/24/17 0606  PROCALCITON  --  <0.10  LATICACIDVEN 1.17  --     Recent Results (from the past 240 hour(s))  Urine culture     Status: Abnormal (Preliminary result)   Collection Time: 06/23/17  9:10 PM  Result Value Ref Range Status   Specimen Description URINE, RANDOM  Final   Special Requests NONE  Final   Culture (A)  Final    >=100,000 COLONIES/mL PSEUDOMONAS AERUGINOSA SUSCEPTIBILITIES TO FOLLOW Performed at [facility], [address], [place], [place] [address]    Report Status PENDING  Incomplete  Culture, blood (routine x 2)     Status: None (Preliminary result)   Collection Time: 06/23/17 10:10 PM  Result Value Ref Range Status   Specimen Description BLOOD LEFT FOREARM  Final   Special Requests   Final    BOTTLES DRAWN AEROBIC AND ANAEROBIC Blood Culture adequate volume   Culture   Final    NO GROWTH 1 DAY Performed at [facility], [place] [address], [place], [place] [address]    Report Status PENDING  Incomplete  Culture, blood (routine x 2)     Status: None (Preliminary result)   Collection Time: 06/23/17 10:15 PM  Result Value Ref Range Status   Specimen Description BLOOD RIGHT ANTECUBITAL  Final   Special Requests   Final    BOTTLES DRAWN AEROBIC AND ANAEROBIC Blood Culture adequate volume   Culture    Final    NO GROWTH 1 DAY Performed at [facility], [place] [address], [place], [place] [address]    Report Status PENDING  Incomplete         Radiology Studies: Dg Chest 2 View  Result Date: 06/23/2017 CLINICAL DATA:  Cough and weakness EXAM: CHEST  2 VIEW COMPARISON:  02/20/16 FINDINGS: Cardiac shadow is within normal limits. Aortic calcifications are again noted and stable. Chronic scarring is noted in the bases posteriorly stable from prior exam. No new focal infiltrate is seen. Degenerative changes of thoracic spine are noted. IMPRESSION: Chronic basilar scarring.  No acute abnormality seen. Electronically Signed   By: Inez Catalina M.D.   On: 06/23/2017 20:52   Ct Renal Stone Study  Result Date: 06/23/2017 CLINICAL DATA:  Flank pain. EXAM: CT ABDOMEN AND PELVIS WITHOUT CONTRAST TECHNIQUE: Multidetector CT imaging of the abdomen and pelvis was performed following the standard protocol without IV contrast. COMPARISON:  December 19, 2010 FINDINGS: Lower chest: There is focal infiltrate in the posterior right lower lobe. There is a small hiatal hernia. No other abnormalities seen in the lung bases. Hepatobiliary: Cholelithiasis is identified without wall thickening or or adjacent fat stranding. No other acute abnormalities are identified in the liver. Pancreas: Unremarkable. No pancreatic ductal dilatation or surrounding inflammatory changes. Spleen: Normal in size without focal abnormality. Adrenals/Urinary Tract: Adrenal glands are normal. A large parapelvic cysts on the right was better appreciated on the contrast-enhanced study from 2012. No hydronephrosis seen bilaterally. The ureters are normal in caliber with no stones. There is a cyst off the posterior left kidney. No suspicious masses. No stones either kidney. Mild symmetric perinephric stranding is chronic. The bladder is unremarkable. Stomach/Bowel: The stomach and small bowel are normal. There is a rectocele to the left. Colonic  diverticulosis is seen without diverticulitis. There is moderate to severe fecal loading throughout the colon. The visualized appendix is normal. Vascular/Lymphatic: Atherosclerotic changes seen in the nonaneurysmal aorta, iliac vessels common femoral vessels. No adenopathy. Reproductive: The prostate is enlarged, exerting mass effect on the posterior bladder. Other: No abdominal wall hernia or abnormality. No abdominopelvic ascites. Musculoskeletal: There is an acute compression fracture T12, extending to the posterior aspect of the vertebral body. No extension identified into the pedicles. Minimal bulging along the posterior aspect of the vertebral body. Approximately 40% loss of body height identified. IMPRESSION: 1. Acute compression fracture of T12 with approximately 40% loss of height. There is minimal posterior bulging along the posterior aspect of the vertebral body. No extension into the pedicles identified. 2. Focal infiltrate in the right lung base may represent pneumonia. Recommend follow-up to resolution. 3. Cholelithiasis. 4. No renal stones or obstruction. 5. Small left rectocele. Colonic diverticulosis. Moderate fecal loading throughout the colon. 6. Atherosclerotic changes in the abdominal aorta. 7. Enlarged prostate. Electronically Signed   By: Dorise Bullion III M.D   On: 06/23/2017 23:05        Scheduled Meds: . acidophilus  1 capsule Oral Daily  . buPROPion  200 mg Oral BID  . feeding supplement (ENSURE ENLIVE)  237 mL Oral BID BM  . guaiFENesin  600 mg Oral BID  . mometasone-formoterol  2 puff Inhalation BID  . montelukast  10 mg Oral Daily  . polyethylene glycol  17 g Oral QODAY  . rOPINIRole  1 mg Oral BID  . simvastatin  40 mg Oral Daily  . tamsulosin  0.4 mg Oral Daily   Continuous Infusions:   LOS: 1 day    Time spent: 35 minutes.     Hosie Poisson, MD [facility] Pager [phone] If 7PM-7AM, please contact night-coverage [email or web address] Password  [email or web address] 06/25/2017, 5:13 PM

## 2017-06-25 NOTE — Evaluation (Signed)
Clinical/Bedside Swallow Evaluation Patient Details  Name: Micheal Frank MRN: [id number] Date of Birth: 1925-12-28  Today's Date: 06/25/2017 Time: SLP Start Time (ACUTE ONLY): 0930 SLP Stop Time (ACUTE ONLY): 1000 SLP Time Calculation (min) (ACUTE ONLY): 30 min  Past Medical History:  Past Medical History:  Diagnosis Date  . Arthritis    left leg also has swelling  . Asthma   . Blood dyscrasia    pt CAN NOT have any blood thinners d/t hx brain bleed  . Chronic kidney disease, stage III (moderate) (HCC) 01/20/2013  . Complication of anesthesia    confusion with anesthesia  . Constipation    miralax prn  . COPD (chronic obstructive pulmonary disease) (HCC)    emphysema  . Depression    takes wellbutrin bid  . Epidural abscess   . Gastric ulcer   . History of blood clots   . Hyperlipidemia   . Melanoma (HCC)   . Peripheral neuropathy   . Pneumonia    as a child and in 1952;had a pneumonia vaccine couple of years ago  . Prosthetic joint infection (HCC)   . S/P right heart catheterization    at least 54yrs  . Shingles   . Short-term memory loss   . Shortness of breath    with exertion  . Sleep apnea    has CPAP but doesn't use it;sleep study done at least 74yrs ago  . Staphylococcus aureus bacteremia with sepsis (HCC)   . Subdural hematoma (HCC) 03/30/11  . Urinary frequency    wears depends   Past Surgical History:  Past Surgical History:  Procedure Laterality Date  . epidural abscess drainage  12/2010  . 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 EYE SURGERY     bil cataract surgery  . FILTERING PROCEDURE     filter placed in neck d/t blood  clot  . HERNIA REPAIR     25+yrs ago  . KNEE ARTHROSCOPY    . SKIN BIOPSY     melanoma on head   . TOTAL KNEE ARTHROPLASTY     x 2 left knee 2003/2012  . TOTAL KNEE REVISION  05/23/2011   Procedure: TOTAL KNEE REVISION;  Surgeon: [name];  Location: [facility] OR;  Service: Orthopedics;   Laterality: Left;  LEFT KNEE REMOVAL OF SPACER, POSSIBLE REIMPLANTATION OF REVISION TKA VERSES REPLACEMENT ANTIBIOTIC SPACER   HPI:  83 y.o.malewith medical history significant ofCOPD, CKDstage III, SDH, depression,mild memory loss,andBPH;who presents with complaints of progressively generalized weakness and poor appetite over the last 3 days. He was admitted for possible sepsis from UTI, pneumonia. CT of the abdomen/pelvis noted focal infiltrate in the posterior right lower   Assessment / Plan / Recommendation Clinical Impression  Patient presents with evidence of a continued primary esophageal dysphagia with what appears to be normal oropharyngeal swallowing function. Intermittent coughing noted, at times independent of po intake. Cannot r/o esophageal reflux/retrograde flow of bolus due to noted CP bar on previous MBS as cause of episodic aspiration, however overall, current diet appears to remain appropriate. Reviewed aspiration precautions and compensatory strategies with patient and RN. SLP will f/u to reinforce strategies.  SLP Visit Diagnosis: Dysphagia, unspecified (R13.10)    Aspiration Risk  Mild aspiration risk    Diet Recommendation Regular;Thin liquid   Liquid Administration via: Cup;Straw Medication Administration: Whole meds with puree Supervision: Patient able to self feed;Full supervision/cueing for compensatory strategies Compensations: Slow rate;Small sips/bites;Follow  solids with liquid Postural Changes: Remain upright for at least 30 minutes after po intake;Seated upright at 90 degrees    Other  Recommendations Oral Care Recommendations: Oral care BID   Follow up Recommendations None      Frequency and Duration min 1 x/week  1 week           Swallow Study   General HPI: 83 y.o.malewith medical history significant ofCOPD, CKDstage III, SDH, depression,mild memory loss,andBPH;who presents with complaints of progressively generalized weakness and  poor appetite over the last 3 days. He was admitted for possible sepsis from UTI, pneumonia. CT of the abdomen/pelvis noted focal infiltrate in the posterior right lower Type of Study: Bedside Swallow Evaluation Previous Swallow Assessment: see HPI Diet Prior to this Study: Regular;Thin liquids Temperature Spikes Noted: No Respiratory Status: Room air History of Recent Intubation: No Behavior/Cognition: Alert;Cooperative;Pleasant mood(HOH) Oral Cavity Assessment: Within Functional Limits Oral Care Completed by SLP: Recent completion by staff Oral Cavity - Dentition: Dentures, top;Dentures, bottom Vision: Functional for self-feeding Self-Feeding Abilities: Able to feed self Patient Positioning: Upright in bed Baseline Vocal Quality: Normal Volitional Cough: Strong Volitional Swallow: Able to elicit    Oral/Motor/Sensory Function Overall Oral Motor/Sensory Function: Within functional limits   Ice Chips Ice chips: Not tested   Thin Liquid Thin Liquid: Within functional limits Presentation: Cup;Self Fed;Straw    Nectar Thick Nectar Thick Liquid: Not tested   Honey Thick Honey Thick Liquid: Not tested   Puree Puree: Within functional limits Presentation: Self Fed;Spoon   Solid   Micheal Frank, Micheal Frank [phone]    Solid: Within functional limits Presentation: Self Fed        Micheal Frank 06/25/2017,10:02 AM

## 2017-06-25 NOTE — Evaluation (Signed)
Physical Therapy Evaluation Patient Details Name: Micheal Frank MRN: [id number] DOB: 02-09-1926 Today's Date: 06/25/2017   History of Present Illness  83 yo male admitted with sepsis, recent T12 fx. Hx of COPD, CKD, hearing loss  Clinical Impression  On eval, pt was Min guard-Min assist for mobility. He walked ~200 feet with a RW. Pt reported some pain improvement since admission. No increase in pain with activity this session. Recommend HHPT f/u. Will follow and progress activity as tolerated.     Follow Up Recommendations Home health PT;Supervision/Assistance - 24 hour    Equipment Recommendations  None recommended by PT    Recommendations for Other Services       Precautions / Restrictions Precautions Precautions: Fall Restrictions Weight Bearing Restrictions: No      Mobility  Bed Mobility Overal bed mobility: Needs Assistance Bed Mobility: Supine to Sit     Supine to sit: HOB elevated;Supervision     General bed mobility comments: for safety. Pt relied no bedrail  Transfers Overall transfer level: Needs assistance Equipment used: Rolling walker (2 wheeled) Transfers: Sit to/from Stand Sit to Stand: Min guard         General transfer comment: close guard for safety. Increased time.   Ambulation/Gait Ambulation/Gait assistance: Min assist Ambulation Distance (Feet): 200 Feet Assistive device: Rolling walker (2 wheeled) Gait Pattern/deviations: Step-through pattern;Trunk flexed;Decreased stride length     General Gait Details: Assist to stabilize during changes in direction. Otherwise, pt was Min guard assist. Pt tolerated distance well. No increase in back pain with ambulation  Stairs            Wheelchair Mobility    Modified Rankin (Stroke Patients Only)       Balance Overall balance assessment: Needs assistance;History of Falls           Standing balance-Leahy Scale: Poor                               Pertinent  Vitals/Pain Pain Assessment: Faces Faces Pain Scale: Hurts little more Pain Location: back Pain Descriptors / Indicators: Sore;Discomfort Pain Intervention(s): Monitored during session;Repositioned    Home Living Family/patient expects to be discharged to:: Private residence Living Arrangements: Children;Other relatives Available Help at Discharge: Family;Available 24 hours/day Type of Home: House Home Access: Ramped entrance     Home Layout: One level Home Equipment: Cane - single point;Walker - 2 wheels;Walker - 4 wheels;Shower seat      Prior Function Level of Independence: Independent with assistive device(s)         Comments: uses RW initially in mornings until he gains his balance. Then transitions to cane use     Hand Dominance        Extremity/Trunk Assessment   Upper Extremity Assessment Upper Extremity Assessment: Generalized weakness    Lower Extremity Assessment Lower Extremity Assessment: Generalized weakness    Cervical / Trunk Assessment Cervical / Trunk Assessment: Kyphotic  Communication   Communication: HOH  Cognition Arousal/Alertness: Awake/alert Behavior During Therapy: WFL for tasks assessed/performed Overall Cognitive Status: Within Functional Limits for tasks assessed                                        General Comments      Exercises     Assessment/Plan    PT Assessment Patient needs continued PT services  PT Problem List Decreased strength;Decreased mobility;Decreased activity tolerance;Decreased balance;Pain       PT Treatment Interventions DME instruction;Gait training;Functional mobility training;Therapeutic activities;Balance training;Patient/family education;Therapeutic exercise    PT Goals (Current goals can be found in the Care Plan section)  Acute Rehab PT Goals Patient Stated Goal: home PT Goal Formulation: With patient Time For Goal Achievement: 07/09/17 Potential to Achieve Goals: Good     Frequency Min 3X/week   Barriers to discharge        Co-evaluation               AM-PAC PT "6 Clicks" Daily Activity  Outcome Measure Difficulty turning over in bed (including adjusting bedclothes, sheets and blankets)?: A Little Difficulty moving from lying on back to sitting on the side of the bed? : A Little Difficulty sitting down on and standing up from a chair with arms (e.g., wheelchair, bedside commode, etc,.)?: A Little Help needed moving to and from a bed to chair (including a wheelchair)?: A Little Help needed walking in hospital room?: A Little Help needed climbing 3-5 steps with a railing? : A Little 6 Click Score: 18    End of Session Equipment Utilized During Treatment: Gait belt Activity Tolerance: Patient tolerated treatment well Patient left: in chair;with call bell/phone within reach;with chair alarm set   PT Visit Diagnosis: Muscle weakness (generalized) (M62.81);Difficulty in walking, not elsewhere classified (R26.2);Pain Pain - part of body: (back)    Time: 1610-9604 PT Time Calculation (min) (ACUTE ONLY): 17 min   Charges:   PT Evaluation $PT Eval Moderate Complexity: 1 Mod     PT G Codes:          Weston Anna, MPT Pager: [phone]

## 2017-06-25 NOTE — Progress Notes (Signed)
Initial Nutrition Assessment  DOCUMENTATION CODES:   Severe malnutrition in context of chronic illness  INTERVENTION:   Ensure Enlive po TID, each supplement provides 350 kcal and 20 grams of protein  NUTRITION DIAGNOSIS:   Severe Malnutrition related to chronic illness(COPD, CKD III) as evidenced by 5.8% weight loss in one month, moderate fat depletion, severe muscle depletion.  GOAL:   Patient will meet greater than or equal to 90% of their needs  MONITOR:   PO intake, Supplement acceptance, Weight trends, Labs  REASON FOR ASSESSMENT:   Malnutrition Screening Tool    ASSESSMENT:   Pt with PMH significant for COPD, CKD III, SDH, depression, and BPH. Presents this admission with complaints of generalized weakness and increasingly worsening appetite over the last three days. Admitted for possible sepsis from UTI and pneumonia.    Spoke with pt at bedside.  Reports having decreased PO intake for the last two weeks related to loss in appetite. States he typically consumes one meal that consist of a meat and a grain.  Pt drinks one Boost per day when he "can remember."  Discussed the importance of protein intake for preservation of lean body mass. Suggested that pt increase his Boost intake to 2-3 x per day if poor appetite persist. Meal completions charted as 80% intake for his last meal. Amendable to supplementation.  Pt reports losing 10 lb in a three week time period.  Records indicate pt has lost 5.8% of body weight in one month. This is significant.  Nutrition-Focused physical exam completed.   Medications reviewed and include: IV abx Labs reviewed: K 3.4 (L) BUN 28 (H) Creatinine 1.33 (H)   NUTRITION - FOCUSED PHYSICAL EXAM:    Most Recent Value  Orbital Region  Moderate depletion  Upper Arm Region  Moderate depletion  Thoracic and Lumbar Region  Unable to assess  Buccal Region  Moderate depletion  Temple Region  Severe depletion  Clavicle Bone Region  Severe  depletion  Clavicle and Acromion Bone Region  Severe depletion  Scapular Bone Region  Unable to assess  Dorsal Hand  Moderate depletion  Patellar Region  Severe depletion  Anterior Thigh Region  Severe depletion  Posterior Calf Region  Severe depletion  Edema (RD Assessment)  None  Hair  Reviewed  Eyes  Reviewed  Mouth  Reviewed  Skin  Reviewed  Nails  Reviewed      Diet Order:  Diet Heart Room service appropriate? Yes; Fluid consistency: Thin  EDUCATION NEEDS:   Education needs have been addressed  Skin:  Skin Assessment: Reviewed RN Assessment  Last BM:  06/24/17  Height:   Ht Readings from Last 1 Encounters:  06/24/17 5\' 10"  (1.778 m)    Weight:   Wt Readings from Last 1 Encounters:  06/24/17 145 lb 15.1 oz (66.2 kg)    Ideal Body Weight:  75.5 kg  BMI:  Body mass index is 20.94 kg/m.  Estimated Nutritional Needs:   Kcal:  1800-2000 kcal/day  Protein:  85-95 g/day  Fluid:  1.8 L/day    Mariana Single RD, LDN Clinical Nutrition Pager # - [phone]

## 2017-06-26 ENCOUNTER — Inpatient Hospital Stay (HOSPITAL_COMMUNITY): Payer: Medicare Other

## 2017-06-26 LAB — LEGIONELLA PNEUMOPHILA SEROGP 1 UR AG: L. PNEUMOPHILA SEROGP 1 UR AG: NEGATIVE

## 2017-06-26 LAB — URINE CULTURE

## 2017-06-26 LAB — BASIC METABOLIC PANEL
ANION GAP: 7 (ref 5–15)
BUN: 25 mg/dL — ABNORMAL HIGH (ref 6–20)
CO2: 27 mmol/L (ref 22–32)
Calcium: 8.3 mg/dL — ABNORMAL LOW (ref 8.9–10.3)
Chloride: 107 mmol/L (ref 101–111)
Creatinine, Ser: 1.29 mg/dL — ABNORMAL HIGH (ref 0.61–1.24)
GFR calc Af Amer: 55 mL/min — ABNORMAL LOW (ref >60)
GFR, EST NON AFRICAN AMERICAN: 47 mL/min — AB (ref >60)
GLUCOSE: 107 mg/dL — AB (ref 65–99)
POTASSIUM: 3.8 mmol/L (ref 3.5–5.1)
Sodium: 141 mmol/L (ref 135–145)

## 2017-06-26 MED ORDER — COLCHICINE 0.6 MG PO TABS
0.6000 mg | ORAL_TABLET | Freq: Every day | ORAL | Status: DC
  Administered 2017-06-26 – 2017-06-27 (×2): 0.6 mg via ORAL
  Filled 2017-06-26 (×2): qty 1

## 2017-06-26 NOTE — Progress Notes (Signed)
Referring Physician(s): Akula,V  Supervising Physician: Arne Cleveland  Patient Status:  [facility] - In-pt  Chief Complaint:  Back pain, T12 fracture  Subjective: Patient familiar to IR service from prior IVC filter placement in 2012.  Past medical history also significant for prior left lower extremity DVT, COPD, chronic kidney disease, subdural hematoma, depression, mild memory loss, BPH and hard of hearing.  He was recently admitted with generalized weakness, diminished appetite, back pain, cough, and Pseudomonas UTI,?PNA.  Blood cultures are currently negative and he is afebrile.  He states he fell approximately 1 month ago and since that time has had persistent mid to low back pain despite narcotic use.  He denies radiation of pain to lower extremities or associated paresthesias.  He does have some bilateral knee soreness, left greater than right.   He has also suffered with weight loss and unsteady gait.  Recent CT revealed acute compression fracture of T12 with approximately 40% loss of height.  There was minimal posterior bulging along the posterior aspect of the vertebral body.  Request now received for possible T12 kyphoplasty.  He currently denies chest pain, worsening dyspnea, abdominal pain, nausea ,vomiting or bleeding.  Past Medical History:  Diagnosis Date  . Arthritis    left leg also has swelling  . Asthma   . Blood dyscrasia    pt CAN NOT have any blood thinners d/t hx brain bleed  . Chronic kidney disease, stage III (moderate) ([place]) 01/20/2013  . Complication of anesthesia    confusion with anesthesia  . Constipation    miralax prn  . COPD (chronic obstructive pulmonary disease) (HCC)    emphysema  . Depression    takes wellbutrin bid  . Epidural abscess   . Gastric ulcer   . History of blood clots   . Hyperlipidemia   . Melanoma ([place])   . Peripheral neuropathy   . Pneumonia    as a child and in 1952;had a pneumonia vaccine couple of years ago  . Prosthetic  joint infection ([place])   . S/P right heart catheterization    at least 58yrs  . Shingles   . Short-term memory loss   . Shortness of breath    with exertion  . Sleep apnea    has CPAP but doesn't use it;sleep study done at least 28yrs ago  . Staphylococcus aureus bacteremia with sepsis ([place])   . Subdural hematoma ([place]) 03/30/11  . Urinary frequency    wears depends   Past Surgical History:  Procedure Laterality Date  . epidural abscess drainage  12/2010  . 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 EYE SURGERY     bil cataract surgery  . FILTERING PROCEDURE     filter placed in neck d/t blood  clot  . HERNIA REPAIR     25+yrs ago  . KNEE ARTHROSCOPY    . SKIN BIOPSY     melanoma on head   . TOTAL KNEE ARTHROPLASTY     x 2 left knee 2003/2012  . TOTAL KNEE REVISION  05/23/2011   Procedure: TOTAL KNEE REVISION;  Surgeon: Meredith Pel;  Location: [facility];  Service: Orthopedics;  Laterality: Left;  LEFT KNEE REMOVAL OF SPACER, POSSIBLE REIMPLANTATION OF REVISION TKA VERSES REPLACEMENT ANTIBIOTIC SPACER    Allergies: Anticoagulant compound and Protonix [pantoprazole sodium]  Medications: Prior to Admission medications   Medication Sig Start Date End Date Taking? Authorizing Provider  acetaminophen (  TYLENOL) 325 MG tablet Take 2 tablets (650 mg total) by mouth every 6 (six) hours as needed for mild pain (or Fever >/= 101). 02/22/16  Yes Regalado, Belkys A, MD  albuterol (PROAIR HFA) 108 (90 BASE) MCG/ACT inhaler Inhale 2 puffs into the lungs every 6 (six) hours as needed for wheezing. 07/31/14  Yes Elsie Stain, MD  benzonatate (TESSALON) 200 MG capsule TAKE ONE CAPSULE 3-4 TIMES DAILY AS NEEDED FOR COUGH Patient taking differently: Take 200 mg by mouth 2 (two) times daily as needed for cough. TAKE ONE CAPSULE 3-4 TIMES DAILY AS NEEDED FOR COUGH 08/16/15  Yes Tanda Rockers, MD  budesonide-formoterol [facility]) 160-4.5 MCG/ACT inhaler Inhale 2 puffs  into the lungs 2 (two) times daily.   Yes [provider]  buPROPion (WELLBUTRIN SR) 200 MG 12 hr tablet Take 200 mg by mouth 2 (two) times daily.   Yes [provider]  calcium carbonate (OS-CAL) 600 MG TABS Take 600 mg by mouth daily.    Yes [provider]  Cranberry-Vitamin C (AZO CRANBERRY URINARY TRACT) 250-60 MG CAPS Take 1 tablet by mouth daily.   Yes [provider]  diazepam (VALIUM) 5 MG tablet Take 1 tablet (5 mg total) by mouth every 6 (six) hours as needed for anxiety. 03/01/16  Yes Paz, Alda Berthold, MD  doxazosin (CARDURA) 4 MG tablet Take 1 tablet (4 mg total) by mouth at bedtime. 11/06/16  Yes Paz, Alda Berthold, MD  fluticasone [facility]) 50 MCG/ACT nasal spray Place 2 sprays into the nose 2 (two) times daily. 08/08/12  Yes Elsie Stain, MD  HYDROcodone-acetaminophen (NORCO/VICODIN) 5-325 MG tablet Take 1 tablet every 12 hours as needed for pain. Patient taking differently: Take 1 tablet by mouth every 12 (twelve) hours as needed for moderate pain or severe pain.  06/05/17  Yes Paz, Alda Berthold, MD  meclizine (ANTIVERT) 25 MG tablet Take 1 tablet (25 mg total) by mouth daily as needed (vertigo). 03/01/16  Yes Paz, Alda Berthold, MD  montelukast (SINGULAIR) 10 MG tablet Take 1 tablet (10 mg total) by mouth daily. 09/20/16  Yes Paz, Alda Berthold, MD  Multiple Vitamin (MULTIVITAMIN WITH MINERALS) TABS Take 1 tablet by mouth daily.   Yes [provider]  Probiotic Product (PROBIOTIC PO) Take 1 capsule by mouth daily.   Yes [provider]  rOPINIRole (REQUIP) 1 MG tablet Take 1 tablet (1 mg total) by mouth 2 (two) times daily. 11/08/16  Yes Paz, Alda Berthold, MD  simvastatin (ZOCOR) 40 MG tablet Take 1 tablet (40 mg total) by mouth daily. 09/20/16  Yes Paz, Alda Berthold, MD  tamsulosin (FLOMAX) 0.4 MG CAPS capsule Take 1 capsule (0.4 mg total) by mouth daily. 09/20/16  Yes Paz, Alda Berthold, MD  triamterene-hydrochlorothiazide (MAXZIDE-25) 37.5-25 MG tablet Take 0.5 tablets by mouth  daily. 11/10/16  Yes Paz, Alda Berthold, MD  saccharomyces boulardii (FLORASTOR) 250 MG capsule Take 1 capsule (250 mg total) by mouth 2 (two) times daily. Patient not taking: Reported on 06/24/2017 03/09/16   Colon Branch, MD  URELLE (URELLE/URISED) 81 MG TABS tablet Take 1 tablet (81 mg total) by mouth every 6 (six) hours as needed for bladder spasms. Patient not taking: Reported on 06/24/2017 02/22/16   Niel Hummer A, MD     Vital Signs: BP (!) 175/85 (BP Location: Right Arm)   Pulse 77   Temp 98.4 F (36.9 C) (Oral)   Resp 18   Ht 5\' 10"  (1.778 m)  Wt 145 lb 15.1 oz (66.2 kg)   SpO2 97%   BMI 20.94 kg/m   Physical Exam patient awake, hard of hearing, answers  questions appropriately when spoken very close to pt.  Chest with few scattered coarse breath sounds, no wheezing.  Heart with regular rate and rhythm.  Abdomen soft, positive bowel sounds, nontender.No LE edema.  Moderate paravertebral tenderness noted at T12 region. Imaging: Dg Chest 2 View  Result Date: 06/23/2017 CLINICAL DATA:  Cough and weakness EXAM: CHEST  2 VIEW COMPARISON:  02/20/16 FINDINGS: Cardiac shadow is within normal limits. Aortic calcifications are again noted and stable. Chronic scarring is noted in the bases posteriorly stable from prior exam. No new focal infiltrate is seen. Degenerative changes of thoracic spine are noted. IMPRESSION: Chronic basilar scarring.  No acute abnormality seen. Electronically Signed   By: Inez Catalina M.D.   On: 06/23/2017 20:52   Ct Renal Stone Study  Result Date: 06/23/2017 CLINICAL DATA:  Flank pain. EXAM: CT ABDOMEN AND PELVIS WITHOUT CONTRAST TECHNIQUE: Multidetector CT imaging of the abdomen and pelvis was performed following the standard protocol without IV contrast. COMPARISON:  December 19, 2010 FINDINGS: Lower chest: There is focal infiltrate in the posterior right lower lobe. There is a small hiatal hernia. No other abnormalities seen in the lung bases. Hepatobiliary: Cholelithiasis  is identified without wall thickening or or adjacent fat stranding. No other acute abnormalities are identified in the liver. Pancreas: Unremarkable. No pancreatic ductal dilatation or surrounding inflammatory changes. Spleen: Normal in size without focal abnormality. Adrenals/Urinary Tract: Adrenal glands are normal. A large parapelvic cysts on the right was better appreciated on the contrast-enhanced study from 2012. No hydronephrosis seen bilaterally. The ureters are normal in caliber with no stones. There is a cyst off the posterior left kidney. No suspicious masses. No stones either kidney. Mild symmetric perinephric stranding is chronic. The bladder is unremarkable. Stomach/Bowel: The stomach and small bowel are normal. There is a rectocele to the left. Colonic diverticulosis is seen without diverticulitis. There is moderate to severe fecal loading throughout the colon. The visualized appendix is normal. Vascular/Lymphatic: Atherosclerotic changes seen in the nonaneurysmal aorta, iliac vessels common femoral vessels. No adenopathy. Reproductive: The prostate is enlarged, exerting mass effect on the posterior bladder. Other: No abdominal wall hernia or abnormality. No abdominopelvic ascites. Musculoskeletal: There is an acute compression fracture T12, extending to the posterior aspect of the vertebral body. No extension identified into the pedicles. Minimal bulging along the posterior aspect of the vertebral body. Approximately 40% loss of body height identified. IMPRESSION: 1. Acute compression fracture of T12 with approximately 40% loss of height. There is minimal posterior bulging along the posterior aspect of the vertebral body. No extension into the pedicles identified. 2. Focal infiltrate in the right lung base may represent pneumonia. Recommend follow-up to resolution. 3. Cholelithiasis. 4. No renal stones or obstruction. 5. Small left rectocele. Colonic diverticulosis. Moderate fecal loading throughout  the colon. 6. Atherosclerotic changes in the abdominal aorta. 7. Enlarged prostate. Electronically Signed   By: Dorise Bullion III M.D   On: 06/23/2017 23:05    Labs:  CBC: Recent Labs    01/28/17 1340 06/11/17 1412 06/23/17 1945 06/24/17 0606  WBC 7.3 9.7 14.5* 12.5*  HGB 14.1 14.7 13.1 13.4  HCT 42.5 44.6 39.9 40.1  PLT 225 287.0 271 271    COAGS: No results for input(s): INR, APTT in the last 8760 hours.  BMP: Recent Labs    01/28/17 1340 06/05/17  1405 06/23/17 1945 06/24/17 0606  NA 139 140 139 139  K 3.7 4.1 3.4* 3.6  CL 105 105 107 106  CO2 25 29 24 24   GLUCOSE 79 71 88 74  BUN 18 26* 28* 25*  CALCIUM 8.5* 9.5 8.4* 8.1*  CREATININE 1.57* 1.74* 1.33* 1.30*  GFRNONAA 37*  --  46* 47*  GFRAA 43*  --  53* 54*    LIVER FUNCTION TESTS: Recent Labs    11/08/16 1200  BILITOT 0.5  AST 20  ALT 7  ALKPHOS 56  PROT 6.4  ALBUMIN 3.8    Assessment and Plan: Pt with past medical history significant for prior left lower extremity DVT, IVC filter placement 2012, COPD, chronic kidney disease, subdural hematoma, depression, mild memory loss, BPH and hard of hearing.  He was recently admitted with generalized weakness, diminished appetite, back pain, cough, and Pseudomonas UTI,?PNA.  Blood cultures are currently negative and he is afebrile.  He states he fell approximately 1 month ago and since that time has had persistent mid to low back pain despite narcotic use.  He has also suffered with weight loss and unsteady gait.  Recent CT revealed acute compression fracture of T12 with approximately 40% loss of height.  There was minimal posterior bulging along the posterior aspect of the vertebral body.  Request now received for possible T12 kyphoplasty.  Latest imaging studies have been reviewed by Dr. Vernard Gambles and T12 fracture appears amenable to kyphoplasty.  Will initiate insurance preauthorization.  Details/risks of procedure, including but not limited to, internal bleeding,  infection, injury to adjacent structures, inability to eradicate back pain, discussed with patient and daughter with their understanding and consent.  Once word received for approval from insurance we will proceed with scheduling patient for procedure.    Electronically Signed: D. Rowe Robert, PA-C 06/26/2017, 1:26 PM   I spent a total of 40 minutes at the the patient's bedside AND on the patient's hospital floor or unit, greater than 50% of which was counseling/coordinating care for possible T12 kyphoplasty    Patient ID: Micheal Frank, male   DOB: 1925/11/28, 83 y.o.   MRN: [id number]

## 2017-06-26 NOTE — Plan of Care (Signed)
Dr. Ree Kida of patient's RT knee pain (possibly injured in fall at home).  Also to please contact Daughter/Lynn to discuss POC (per her request).

## 2017-06-26 NOTE — Progress Notes (Signed)
PROGRESS NOTE    Micheal Frank  OHY:[id number] DOB: [birth year] DOA: 06/23/2017 PCP: Colon Branch, MD    Brief Narrative:Micheal C Smithis a 83 y.o.malewith medical history significant ofCOPD, CKDstage III, SDH, depression,mild memory loss,andBPH;who presents with complaints of progressively generalized weakness and poor appetite over the last 3 days.  He was admitted for possible sepsis from UTI, pneumonia and t 12 fracture. .     Assessment & Plan:    Active Problems:   RESTLESS LEG SYNDROME   COPD GOLD II    Chronic kidney disease, stage III (moderate) (HCC)   UTI (urinary tract infection)   Compression fracture of T12 vertebra (HCC)   Generalized weakness   Compression fracture of T12 vertebra: Pain control. IR consult for evaluation for kyphoplasty.  PT eval.    Right lower lobe pneumonia:  Admitted for IV antibiotics.  Blood cultures ordered and are negative.  [place] oxygen to keep sats greater than 90% No sputum culture yet.  Pro calcitonin and lactic acid negative.  Started on IV rocephin and zithromax and narrowed to levaquin to treat both pneumonia and UTI.  Strep pneumo urinary antigen is negative. Sepsis ruled out.  Afebrile and leukocytosis is improving.    Pseudomonas UTI:  Sensitive to flouroquinolones. Changed antibiotic to levaquin to complete the course.    Copd; No wheezing , stable. Resume home meds.    Stage 3 CKD: Stable.    H/o SDH:  No signs of SDH.   Hyperlipidemia:  Resume zocor.    BPH: Resume flomax.    Knee pain: mostly in the right , some in the left: Get X RAYS  Of the knee . Pt reports unable to participate in therapy due to that.   DVT prophylaxis:scd's. / heparin sq.  Code Status: (full code.  Family Communication: called daughter and left message.  Disposition Plan: pending evaluation by IR.   Consultants:   IR for evaluation of kyphoplasty.    Procedures: none   Antimicrobials: zithromax and  rocephin since admission, later on transitioned to levaquin from 12/10.   Subjective: Severe knee pain.   Objective: Vitals:   06/25/17 2016 06/25/17 2110 06/26/17 0515 06/26/17 0916  BP: 125/72  (!) 175/85   Pulse: 71  77   Resp: 18  18   Temp: 98.2 F (36.8 C)  98.4 F (36.9 C)   TempSrc: Oral  Oral   SpO2: 97% 96% 98% 97%  Weight:      Height:        Intake/Output Summary (Last 24 hours) at 06/26/2017 1252 Last data filed at 06/26/2017 0723 Gross per 24 hour  Intake 330 ml  Output 1400 ml  Net -1070 ml   Filed Weights   06/23/17 1908 06/24/17 0222  Weight: 65.8 kg (145 lb) 66.2 kg (145 lb 15.1 oz)    Examination:  General exam: Appears uncomfortable, in pain fro knees, mostly the right knee.  Respiratory system: coarse breath sounds heard. No wheezing or rhonchi. Air entry fair.  Cardiovascular system: S1 & S2 heard, RRR. No JVD, murmurs,No pedal edema. Gastrointestinal system: abdomen is soft non tender non distended bowel sounds heard.  Central nervous system: confused. Non focal.  Extremities: Symmetric 5 x 5 power. No cyanosis or clubbing.  Skin: No rashes, lesions or ulcers Psychiatry:  Mood & affect appropriate.     Data Reviewed: I have personally reviewed following labs and imaging studies  CBC: Recent Labs  Lab 06/23/17 1945 06/24/17 0606  WBC  14.5* 12.5*  HGB 13.1 13.4  HCT 39.9 40.1  MCV 94.5 94.6  PLT 271 038   Basic Metabolic Panel: Recent Labs  Lab 06/23/17 1945 06/24/17 0606  NA 139 139  K 3.4* 3.6  CL 107 106  CO2 24 24  GLUCOSE 88 74  BUN 28* 25*  CREATININE 1.33* 1.30*  CALCIUM 8.4* 8.1*   GFR: Estimated Creatinine Clearance: 36.1 mL/min (A) (by C-G formula based on SCr of 1.3 mg/dL (H)). Liver Function Tests: No results for input(s): AST, ALT, ALKPHOS, BILITOT, PROT, ALBUMIN in the last 168 hours. No results for input(s): LIPASE, AMYLASE in the last 168 hours. No results for input(s): AMMONIA in the last 168  hours. Coagulation Profile: No results for input(s): INR, PROTIME in the last 168 hours. Cardiac Enzymes: No results for input(s): CKTOTAL, CKMB, CKMBINDEX, TROPONINI in the last 168 hours. BNP (last 3 results) No results for input(s): PROBNP in the last 8760 hours. HbA1C: No results for input(s): HGBA1C in the last 72 hours. CBG: Recent Labs  Lab 06/23/17 1959  GLUCAP 90   Lipid Profile: No results for input(s): CHOL, HDL, LDLCALC, TRIG, CHOLHDL, LDLDIRECT in the last 72 hours. Thyroid Function Tests: No results for input(s): TSH, T4TOTAL, FREET4, T3FREE, THYROIDAB in the last 72 hours. Anemia Panel: No results for input(s): VITAMINB12, FOLATE, FERRITIN, TIBC, IRON, RETICCTPCT in the last 72 hours. Sepsis Labs: Recent Labs  Lab 06/23/17 2222 06/24/17 0606  PROCALCITON  --  <0.10  LATICACIDVEN 1.17  --     Recent Results (from the past 240 hour(s))  Urine culture     Status: Abnormal   Collection Time: 06/23/17  9:10 PM  Result Value Ref Range Status   Specimen Description URINE, RANDOM  Final   Special Requests NONE  Final   Culture >=100,000 COLONIES/mL PSEUDOMONAS AERUGINOSA (A)  Final   Report Status 06/26/2017 FINAL  Final   Organism ID, Bacteria PSEUDOMONAS AERUGINOSA (A)  Final      Susceptibility   Pseudomonas aeruginosa - MIC*    CEFTAZIDIME 4 SENSITIVE Sensitive     CIPROFLOXACIN 1 SENSITIVE Sensitive     GENTAMICIN 8 INTERMEDIATE Intermediate     IMIPENEM 2 SENSITIVE Sensitive     PIP/TAZO 16 SENSITIVE Sensitive     CEFEPIME 8 SENSITIVE Sensitive     * >=100,000 COLONIES/mL PSEUDOMONAS AERUGINOSA  Culture, blood (routine x 2)     Status: None (Preliminary result)   Collection Time: 06/23/17 10:10 PM  Result Value Ref Range Status   Specimen Description BLOOD LEFT FOREARM  Final   Special Requests   Final    BOTTLES DRAWN AEROBIC AND ANAEROBIC Blood Culture adequate volume   Culture   Final    NO GROWTH 2 DAYS Performed at [facility],  [address], [place], [place] [address]    Report Status PENDING  Incomplete  Culture, blood (routine x 2)     Status: None (Preliminary result)   Collection Time: 06/23/17 10:15 PM  Result Value Ref Range Status   Specimen Description BLOOD RIGHT ANTECUBITAL  Final   Special Requests   Final    BOTTLES DRAWN AEROBIC AND ANAEROBIC Blood Culture adequate volume   Culture   Final    NO GROWTH 2 DAYS Performed at [facility], [place] [address] [place], [place] [address]    Report Status PENDING  Incomplete         Radiology Studies: No results found.  Scheduled Meds: . acidophilus  1 capsule Oral Daily  . buPROPion  200 mg Oral BID  . feeding supplement (ENSURE ENLIVE)  237 mL Oral TID BM  . guaiFENesin  600 mg Oral BID  . mometasone-formoterol  2 puff Inhalation BID  . montelukast  10 mg Oral Daily  . polyethylene glycol  17 g Oral QODAY  . rOPINIRole  1 mg Oral BID  . simvastatin  40 mg Oral Daily  . tamsulosin  0.4 mg Oral Daily   Continuous Infusions: . levofloxacin (LEVAQUIN) IV Stopped (06/25/17 2232)     LOS: 2 days    Time spent: 35 minutes.     Hosie Poisson, MD [facility] Pager [phone] If 7PM-7AM, please contact night-coverage [email or web address] Password [email or web address] 06/26/2017, 12:52 PM

## 2017-06-26 NOTE — Progress Notes (Signed)
Spoke with pt's daughter concerning delivery of hospital bed. Medicare now requires DME to be delivered on the day of discharge/[facility] rep. Daughter selected [facility] for [facility] needs. Referral given to [facility] in house rep.  Will continue to follow pt.

## 2017-06-26 NOTE — Care Management Note (Signed)
Case Management Note  Patient Details  Name: TRAYSHAWN DURKIN MRN: [id number] Date of Birth: Sep 17, 1925  Subjective/Objective: Pt admitted with Sepsis, UTI, PNA, CT revealed acute compression fracture of T12, request for possible T12 Kyphoplasty.                     Action/Plan: Plan to discharge home with daughter and [facility].   Expected Discharge Date:  06/28/17               Expected Discharge Plan:  [place]  In-House Referral:  Clinical Social Work  Discharge planning Services  CM Consult  Post Acute Care Choice:    Choice offered to:  Adult Children  DME Arranged:  Hospital bed DME Agency:  [place]:  RN [facility] Agency:  [facility] (now [facility])  Status of Service:  Completed, signed off  If discussed at [place] of Stay Meetings, dates discussed:    Additional CommentsPurcell Mouton, RN 06/26/2017, 3:20 PM

## 2017-06-26 NOTE — Progress Notes (Signed)
  Speech Language Pathology Treatment: Dysphagia  Patient Details Name: Micheal Frank MRN: [id number] DOB: 1926/02/23 Today's Date: 06/26/2017 Time: 1535-1600 SLP Time Calculation (min) (ACUTE ONLY): 25 min  Assessment / Plan / Recommendation Clinical Impression  SLP visit to assure pt tolerating po intake well.  Pt with good intake per chart review and appearance of adequate tolerance.  No indication of aspiration/dysphagia with liquid intake.    Xerostomia reported by pt therefore advised he start meals with liquids.  Advised pt to keep cough and expectoration strong for airway protection given chronicity of dysphagia and plan for kyphoplasaty.    Recommend continue diet as tolerated - medicine with puree.  SLP to sign off as all education completed using teach back, written strategies.     HPI HPI: 83 y.o.malewith medical history significant ofCOPD, CKDstage III, SDH, depression,mild memory loss,andBPH;who presents with complaints of progressively generalized weakness and poor appetite over the last 3 days. He was admitted for possible sepsis from UTI, pneumonia. CT of the abdomen/pelvis noted focal infiltrate in the posterior right lower.  Pt today seen to assess tolerance of po diet.  Per notes, pt for possible kyphoplasty tomorrow.  Pt states he has had 2 prior swallow evaluations done and he avoids grits/macaroni and cheese.  He admits to xerostomia and occasional problems with food/pills lodging in pharynx.           SLP Plan   NO SLP follow up       Recommendations  Diet recommendations: Regular;Thin liquid Liquids provided via: Cup;Straw Medication Administration: Whole meds with puree Supervision: Patient able to self feed Compensations: Slow rate;Small sips/bites;Follow solids with liquid Postural Changes and/or Swallow Maneuvers: Seated upright 90 degrees;Upright 30-60 min after meal                Oral Care Recommendations: Oral care BID Follow up  Recommendations: None SLP Visit Diagnosis: Dysphagia, unspecified (R13.10)       GO              Luanna Salk, MS [facility] SLP [phone]   Macario Golds 06/26/2017, 4:18 PM

## 2017-06-27 DIAGNOSIS — J189 Pneumonia, unspecified organism: Secondary | ICD-10-CM

## 2017-06-27 DIAGNOSIS — N390 Urinary tract infection, site not specified: Secondary | ICD-10-CM

## 2017-06-27 DIAGNOSIS — R531 Weakness: Secondary | ICD-10-CM

## 2017-06-27 DIAGNOSIS — M549 Dorsalgia, unspecified: Secondary | ICD-10-CM

## 2017-06-27 DIAGNOSIS — G2581 Restless legs syndrome: Secondary | ICD-10-CM

## 2017-06-27 DIAGNOSIS — A419 Sepsis, unspecified organism: Secondary | ICD-10-CM

## 2017-06-27 DIAGNOSIS — J449 Chronic obstructive pulmonary disease, unspecified: Secondary | ICD-10-CM

## 2017-06-27 DIAGNOSIS — N3000 Acute cystitis without hematuria: Secondary | ICD-10-CM

## 2017-06-27 DIAGNOSIS — N183 Chronic kidney disease, stage 3 (moderate): Secondary | ICD-10-CM

## 2017-06-27 DIAGNOSIS — S22080A Wedge compression fracture of T11-T12 vertebra, initial encounter for closed fracture: Secondary | ICD-10-CM

## 2017-06-27 LAB — CBC
HCT: 42.6 % (ref 39.0–52.0)
HEMOGLOBIN: 14.1 g/dL (ref 13.0–17.0)
MCH: 31.7 pg (ref 26.0–34.0)
MCHC: 33.1 g/dL (ref 30.0–36.0)
MCV: 95.7 fL (ref 78.0–100.0)
PLATELETS: 323 10*3/uL (ref 150–400)
RBC: 4.45 MIL/uL (ref 4.22–5.81)
RDW: 14 % (ref 11.5–15.5)
WBC: 16 10*3/uL — ABNORMAL HIGH (ref 4.0–10.5)

## 2017-06-27 MED ORDER — LEVOFLOXACIN 750 MG PO TABS: 750.0000 mg | ORAL_TABLET | ORAL | 0 refills | Status: DC

## 2017-06-27 MED ORDER — LEVOFLOXACIN 750 MG PO TABS
750.0000 mg | ORAL_TABLET | ORAL | Status: DC
Start: 2017-06-27 — End: 2017-06-27

## 2017-06-27 NOTE — Discharge Summary (Addendum)
Physician Discharge Summary  Micheal Frank [id number]:[id number] DOB: [birth year] DOA: 06/23/2017  PCP: Colon Branch, MD  Admit date: 06/23/2017 Discharge date: 06/27/2017  Admitted From: Home Disposition: Home   Recommendations for Outpatient Follow-up:  1. Follow up with PCP in 1-2 weeks. Consider orthopedic referral for right knee severe OA.  2. Consider referral for elective kyphoplasty if back pain returns.   Home Health: PT, RN Equipment/Devices: Hospital bed Discharge Condition: Stable CODE STATUS: Full Diet recommendation: As tolerated  Brief/Interim Summary: Larenz Frasier Smithis a 83 y.o.malewith medical history significant ofCOPD, CKDstage III, SDH, depression,mild memory loss,andBPH;who presents with complaints of progressively generalized weakness and poor appetite over the last 3 days.  He was admitted for possible sepsis from UTI, pneumonia and t12 fracture sustained weeks prior due to mechanical fall. His condition improved with antibiotics which were later directed by culture data. While he initially endorsed back pain, this subsided. Plan was for kyphoplasty, though it was felt that with his mobility unaffected and no pain he could be discharged and consider elective intervention if pain returned.  Discharge Diagnoses:  Principal Problem:   Sepsis ([place]) Active Problems:   RESTLESS LEG SYNDROME   COPD GOLD II    Chronic kidney disease, stage III (moderate) (HCC)   UTI (urinary tract infection)   Compression fracture of T12 vertebra (HCC)   Generalized weakness   CAP (community acquired pneumonia)  Compression fracture of T12 vertebra: Following mechanical fall at home several weeks PTA. Pain has improved significantly. Initially requested insurance authorization for kyphoplasty per IR and scheduled for 12/13, however due to resolution of pain, this will be deferred to an elective procedure as an outpatient.    Falls:  - Home health PT recommended and continue  using rolling walker.   Right lower lobe pneumonia: PCT undetectable. No hypoxia. Strep pneumo urinary antigen is negative. Sepsis ruled out. Afebrile and leukocytosis is improving.  - Blood cultures ordered and are negative.  - Started on IV rocephin and zithromax and narrowed to levaquin to treat both pneumonia and UTI.   Pseudomonas UTI:  - Sensitive to flouroquinolones, started levaquin 12/10. Will continue renally dosed.   COPD: No wheezing, stable.  - Resume home meds.   Stage 3 CKD: Stable.   H/o SDH: Stable  Hyperlipidemia:  - Resume zocor.   BPH: - Resume flomax.   Severe right knee osteoarthritis: With h/o multiple back surgeries and left TKA, suspect pain due to tricompartmental joint space narrowing seen on XR. Chondrocalcinosis also noted on XR. No significant effusion on exam. Full ROM without erythema. This is not gout or septic joint. Pain is currently NOT limiting mobility.  - Follow up with PCP, consider intraarticular steroid for OA and/or CPPD, and/or referral to orthopedics.   Severe malnutrition: in context of chronic illness as evidenced by 5.8% weight loss in one month, moderate fat depletion, severe muscle depletion".  - Nutrition Consulted - Recommendations: Ensure Enlive po TID   Discharge Instructions Discharge Instructions    Diet - low sodium heart healthy   Complete by:  As directed    Discharge instructions   Complete by:  As directed    - Continue taking levaquin every other evening starting tonight to treat UTI and pneumonia.  - Continue taking norco as needed for pain as you were prior to admission.  - Follow up with your primary care provider, Dr. Larose Kells, in the next 1 - 2 weeks to discuss recovery from UTI/pneumonia, knee pain and back  pain.  - If your pain worsens or you fall or have fever, seek medical attention right away.   Increase activity slowly   Complete by:  As directed      Allergies as of 06/27/2017      Reactions    Anticoagulant Compound    History of bleeding on the brain per daughter   Protonix [pantoprazole Sodium] Diarrhea      Medication List    TAKE these medications   acetaminophen 325 MG tablet Commonly known as:  TYLENOL Take 2 tablets (650 mg total) by mouth every 6 (six) hours as needed for mild pain (or Fever >/= 101).   albuterol 108 (90 Base) MCG/ACT inhaler Commonly known as:  PROAIR HFA Inhale 2 puffs into the lungs every 6 (six) hours as needed for wheezing.   AZO CRANBERRY URINARY TRACT 250-60 MG Caps Generic drug:  Cranberry-Vitamin C Take 1 tablet by mouth daily.   benzonatate 200 MG capsule Commonly known as:  TESSALON TAKE ONE CAPSULE 3-4 TIMES DAILY AS NEEDED FOR COUGH What changed:    how much to take  how to take this  when to take this  reasons to take this  additional instructions   budesonide-formoterol 160-4.5 MCG/ACT inhaler Commonly known as:  SYMBICORT Inhale 2 puffs into the lungs 2 (two) times daily.   buPROPion 200 MG 12 hr tablet Commonly known as:  WELLBUTRIN SR Take 200 mg by mouth 2 (two) times daily.   calcium carbonate 600 MG Tabs tablet Commonly known as:  OS-CAL Take 600 mg by mouth daily.   diazepam 5 MG tablet Commonly known as:  VALIUM Take 1 tablet (5 mg total) by mouth every 6 (six) hours as needed for anxiety.   doxazosin 4 MG tablet Commonly known as:  CARDURA Take 1 tablet (4 mg total) by mouth at bedtime.   fluticasone 50 MCG/ACT nasal spray Commonly known as:  FLONASE Place 2 sprays into the nose 2 (two) times daily.   HYDROcodone-acetaminophen 5-325 MG tablet Commonly known as:  NORCO/VICODIN Take 1 tablet every 12 hours as needed for pain. What changed:    how much to take  how to take this  when to take this  reasons to take this  additional instructions   levofloxacin 750 MG tablet Commonly known as:  LEVAQUIN Take 1 tablet (750 mg total) by mouth every other day.   meclizine 25 MG  tablet Commonly known as:  ANTIVERT Take 1 tablet (25 mg total) by mouth daily as needed (vertigo).   montelukast 10 MG tablet Commonly known as:  SINGULAIR Take 1 tablet (10 mg total) by mouth daily.   multivitamin with minerals Tabs tablet Take 1 tablet by mouth daily.   PROBIOTIC PO Take 1 capsule by mouth daily.   rOPINIRole 1 MG tablet Commonly known as:  REQUIP Take 1 tablet (1 mg total) by mouth 2 (two) times daily.   saccharomyces boulardii 250 MG capsule Commonly known as:  FLORASTOR Take 1 capsule (250 mg total) by mouth 2 (two) times daily.   simvastatin 40 MG tablet Commonly known as:  ZOCOR Take 1 tablet (40 mg total) by mouth daily.   tamsulosin 0.4 MG Caps capsule Commonly known as:  FLOMAX Take 1 capsule (0.4 mg total) by mouth daily.   triamterene-hydrochlorothiazide 37.5-25 MG tablet Commonly known as:  MAXZIDE-25 Take 0.5 tablets by mouth daily.   URELLE 81 MG Tabs tablet Take 1 tablet (81 mg total) by mouth every 6 (  six) hours as needed for bladder spasms.      Follow-up Information    [facility] Follow up.   Specialty:  Home Health Services Why:  [facility] nursing/physical therapy Contact information: [address] [place] [address] [place] Follow up.   Why:  hospital bed Contact information: [place] [address] [phone]          Allergies  Allergen Reactions  . Anticoagulant Compound     History of bleeding on the brain per daughter  . Protonix [Pantoprazole Sodium] Diarrhea    Consultations:  IR  Procedures/Studies: Dg Chest 2 View  Result Date: 06/23/2017 CLINICAL DATA:  Cough and weakness EXAM: CHEST  2 VIEW COMPARISON:  02/20/16 FINDINGS: Cardiac shadow is within normal limits. Aortic calcifications are again noted and stable. Chronic scarring is noted in the bases posteriorly stable from prior exam. No new focal infiltrate is seen.  Degenerative changes of thoracic spine are noted. IMPRESSION: Chronic basilar scarring.  No acute abnormality seen. Electronically Signed   By: Inez Catalina M.D.   On: 06/23/2017 20:52   Dg Thoracic Spine W/swimmers  Result Date: 06/12/2017 CLINICAL DATA:  Fall. EXAM: THORACIC SPINE - 3 VIEWS COMPARISON:  02/20/2016. FINDINGS: Thoracic spine scoliosis and degenerative change. Lower thoracic spine compression fracture noted. This is most likely T12. This is new from prior chest x-ray of 02/20/2016 . No other acute abnormality identified . IMPRESSION: Lower thoracic spine compression fracture noted. This is most likely T12. This is new from prior chest x-ray of 02/20/2016. No other acute abnormality identified . Electronically Signed   By: Marcello Moores  Register   On: 06/12/2017 07:50   Dg Lumbar Spine Complete  Result Date: 06/06/2017 CLINICAL DATA:  Fall, back pain EXAM: LUMBAR SPINE - COMPLETE 4+ VIEW COMPARISON:  MRI 08/14/2016 FINDINGS: Postoperative changes in the lower lumbar spine. Degenerative disc and facet disease throughout the lumbar spine, most pronounced in the lower lumbar spine. Grade 1 anterolisthesis of L5 on S1, stable. No fracture. SI joints are symmetric and unremarkable. Aorta is heavily calcified. No visible aneurysm. IVC filter in place. IMPRESSION: Advanced degenerative disc disease. Postoperative changes in the lower lumbar spine. No acute findings. Electronically Signed   By: Rolm Baptise M.D.   On: 06/06/2017 08:16   Dg Sacrum/coccyx  Result Date: 06/06/2017 CLINICAL DATA:  Sacrococcygeal pain after fall last week. EXAM: SACRUM AND COCCYX - 2+ VIEW COMPARISON:  CT scan of December 19, 2010. FINDINGS: There is no evidence of fracture or other focal bone lesions. Sacroiliac joints are unremarkable. IMPRESSION: No significant abnormality seen in the sacrum or coccyx. Electronically Signed   By: Marijo Conception, M.D.   On: 06/06/2017 08:19   Ct Renal Stone Study  Result Date:  06/23/2017 CLINICAL DATA:  Flank pain. EXAM: CT ABDOMEN AND PELVIS WITHOUT CONTRAST TECHNIQUE: Multidetector CT imaging of the abdomen and pelvis was performed following the standard protocol without IV contrast. COMPARISON:  December 19, 2010 FINDINGS: Lower chest: There is focal infiltrate in the posterior right lower lobe. There is a small hiatal hernia. No other abnormalities seen in the lung bases. Hepatobiliary: Cholelithiasis is identified without wall thickening or or adjacent fat stranding. No other acute abnormalities are identified in the liver. Pancreas: Unremarkable. No pancreatic ductal dilatation or surrounding inflammatory changes. Spleen: Normal in size without focal abnormality. Adrenals/Urinary Tract: Adrenal glands are normal. A  large parapelvic cysts on the right was better appreciated on the contrast-enhanced study from 2012. No hydronephrosis seen bilaterally. The ureters are normal in caliber with no stones. There is a cyst off the posterior left kidney. No suspicious masses. No stones either kidney. Mild symmetric perinephric stranding is chronic. The bladder is unremarkable. Stomach/Bowel: The stomach and small bowel are normal. There is a rectocele to the left. Colonic diverticulosis is seen without diverticulitis. There is moderate to severe fecal loading throughout the colon. The visualized appendix is normal. Vascular/Lymphatic: Atherosclerotic changes seen in the nonaneurysmal aorta, iliac vessels common femoral vessels. No adenopathy. Reproductive: The prostate is enlarged, exerting mass effect on the posterior bladder. Other: No abdominal wall hernia or abnormality. No abdominopelvic ascites. Musculoskeletal: There is an acute compression fracture T12, extending to the posterior aspect of the vertebral body. No extension identified into the pedicles. Minimal bulging along the posterior aspect of the vertebral body. Approximately 40% loss of body height identified. IMPRESSION: 1. Acute  compression fracture of T12 with approximately 40% loss of height. There is minimal posterior bulging along the posterior aspect of the vertebral body. No extension into the pedicles identified. 2. Focal infiltrate in the right lung base may represent pneumonia. Recommend follow-up to resolution. 3. Cholelithiasis. 4. No renal stones or obstruction. 5. Small left rectocele. Colonic diverticulosis. Moderate fecal loading throughout the colon. 6. Atherosclerotic changes in the abdominal aorta. 7. Enlarged prostate. Electronically Signed   By: Dorise Bullion III M.D   On: 06/23/2017 23:05   Dg Knee 3 Views Left  Result Date: 06/26/2017 CLINICAL DATA:  Diffuse left knee pain, twisted last night EXAM: LEFT KNEE - 3 VIEW COMPARISON:  Left knee films of 03/03/2012 FINDINGS: The femoral and tibial components of the left knee replacement are unchanged in position and alignment. No acute fracture is seen. The only questionable abnormality is a band of sclerosis across the distal left femur just above the condyles. This is not seen in retrospect and could represent stress fracture with healing. IMPRESSION: 1. No definite acute abnormality. Left total knee replacement components are unchanged. 2. Band of sclerosis across the distal left femur may indicate the presence of a stress fracture with healing. Electronically Signed   By: Ivar Drape M.D.   On: 06/26/2017 14:57   Dg Knee 3 View Right  Result Date: 06/26/2017 CLINICAL DATA:  Right knee pain, twisted last night EXAM: RIGHT KNEE - 3 VIEW COMPARISON:  None. FINDINGS: There is tricompartmental degenerative joint disease of the right knee primarily involving the medial compartment where there is almost complete loss of joint space. No acute fracture is seen. There does appear to be a small amount of joint fluid present. There also is evidence of chondrocalcinosis which may indicate CPPD arthropathy. Diffuse arterial calcifications are noted. IMPRESSION: 1. No acute  fracture. 2. Tricompartmental degenerative joint disease primarily involving the medial compartment. 3. Chondrocalcinosis may indicate CPPD arthropathy. 4. Tiny amount of fluid in the suprapatellar joint space. Electronically Signed   By: Ivar Drape M.D.   On: 06/26/2017 15:00    Subjective: Feels well, wants to go home. Walked with me through hallway with minimal right knee pain and no back pain. Breathing at baseline. No dysuria, fever.   Discharge Exam: Vitals:   06/27/17 0617 06/27/17 0806  BP: 140/78   Pulse: 85   Resp: 18   Temp: 98 F (36.7 C)   SpO2: 98% 98%   General: Pt is alert, awake, not in acute  distress Cardiovascular: RRR, S1/S2 +, no rubs, no gallops Respiratory: CTA bilaterally, no wheezing, no rhonchi Abdominal: Soft, NT, ND, bowel sounds + MSK: No edema, no cyanosis. Bilateral knees with bony hypertrophy and no effusion, erythema, or tenderness to palpation. Midline vertebrae without significant tenderness to palpation.   Labs: BNP (last 3 results) No results for input(s): BNP in the last 8760 hours. Basic Metabolic Panel: Recent Labs  Lab 06/23/17 1945 06/24/17 0606 06/26/17 1944  NA 139 139 141  K 3.4* 3.6 3.8  CL 107 106 107  CO2 24 24 27   GLUCOSE 88 74 107*  BUN 28* 25* 25*  CREATININE 1.33* 1.30* 1.29*  CALCIUM 8.4* 8.1* 8.3*   Liver Function Tests: No results for input(s): AST, ALT, ALKPHOS, BILITOT, PROT, ALBUMIN in the last 168 hours. No results for input(s): LIPASE, AMYLASE in the last 168 hours. No results for input(s): AMMONIA in the last 168 hours. CBC: Recent Labs  Lab 06/23/17 1945 06/24/17 0606 06/27/17 0458  WBC 14.5* 12.5* 16.0*  HGB 13.1 13.4 14.1  HCT 39.9 40.1 42.6  MCV 94.5 94.6 95.7  PLT 271 271 323   Cardiac Enzymes: No results for input(s): CKTOTAL, CKMB, CKMBINDEX, TROPONINI in the last 168 hours. BNP: Invalid input(s): POCBNP CBG: Recent Labs  Lab 06/23/17 1959  GLUCAP 90   D-Dimer No results for  input(s): DDIMER in the last 72 hours. Hgb A1c No results for input(s): HGBA1C in the last 72 hours. Lipid Profile No results for input(s): CHOL, HDL, LDLCALC, TRIG, CHOLHDL, LDLDIRECT in the last 72 hours. Thyroid function studies No results for input(s): TSH, T4TOTAL, T3FREE, THYROIDAB in the last 72 hours.  Invalid input(s): FREET3 Anemia work up No results for input(s): VITAMINB12, FOLATE, FERRITIN, TIBC, IRON, RETICCTPCT in the last 72 hours. Urinalysis    Component Value Date/Time   COLORURINE YELLOW 06/23/2017 2110   APPEARANCEUR CLOUDY (A) 06/23/2017 2110   LABSPEC 1.020 06/23/2017 2110   PHURINE 7.5 06/23/2017 2110   GLUCOSEU NEGATIVE 06/23/2017 2110   HGBUR NEGATIVE 06/23/2017 2110   BILIRUBINUR NEGATIVE 06/23/2017 2110   KETONESUR NEGATIVE 06/23/2017 2110   PROTEINUR NEGATIVE 06/23/2017 2110   UROBILINOGEN 1.0 01/23/2013 0915   NITRITE POSITIVE (A) 06/23/2017 2110   LEUKOCYTESUR SMALL (A) 06/23/2017 2110    Microbiology Recent Results (from the past 240 hour(s))  Urine culture     Status: Abnormal   Collection Time: 06/23/17  9:10 PM  Result Value Ref Range Status   Specimen Description URINE, RANDOM  Final   Special Requests NONE  Final   Culture >=100,000 COLONIES/mL PSEUDOMONAS AERUGINOSA (A)  Final   Report Status 06/26/2017 FINAL  Final   Organism ID, Bacteria PSEUDOMONAS AERUGINOSA (A)  Final      Susceptibility   Pseudomonas aeruginosa - MIC*    CEFTAZIDIME 4 SENSITIVE Sensitive     CIPROFLOXACIN 1 SENSITIVE Sensitive     GENTAMICIN 8 INTERMEDIATE Intermediate     IMIPENEM 2 SENSITIVE Sensitive     PIP/TAZO 16 SENSITIVE Sensitive     CEFEPIME 8 SENSITIVE Sensitive     * >=100,000 COLONIES/mL PSEUDOMONAS AERUGINOSA  Culture, blood (routine x 2)     Status: None   Collection Time: 06/23/17 10:10 PM  Result Value Ref Range Status   Specimen Description BLOOD LEFT FOREARM  Final   Special Requests   Final    BOTTLES DRAWN AEROBIC AND ANAEROBIC Blood  Culture adequate volume   Culture   Final    NO GROWTH 5  DAYS Performed at [facility], [place] [address], [place], [place] [address]    Report Status 06/29/2017 FINAL  Final  Culture, blood (routine x 2)     Status: None   Collection Time: 06/23/17 10:15 PM  Result Value Ref Range Status   Specimen Description BLOOD RIGHT ANTECUBITAL  Final   Special Requests   Final    BOTTLES DRAWN AEROBIC AND ANAEROBIC Blood Culture adequate volume   Culture   Final    NO GROWTH 5 DAYS Performed at [facility], [place] [address], [place], [place] [address]    Report Status 06/29/2017 FINAL  Final    Time coordinating discharge: Approximately 40 minutes  Vance Gather, MD  [facility] 06/30/2017, 4:16 PM Pager [phone]

## 2017-06-27 NOTE — Discharge Planning (Signed)
Patients IV's X2 removed. RN assessment and VS revealed stability for DC to home with [facility]. Discharge papers, printed, explained and educated.  Informed of FU appt needed and appt made.  Levequin script e-scribed to [facility] in [place].  [facility] will be calling patient to set up 1st visit for PT and [facility] is delivering hospital bed to patient's home today.  Once ready will be wheeled to front and family transporting home via car.

## 2017-06-27 NOTE — Care Management Important Message (Signed)
Important Message  Patient Details  Name: Micheal Frank MRN: [id number] Date of Birth: [birth year]   Medicare Important Message Given:  Yes    Kerin Salen 06/27/2017, 11:38 AMImportant Message  Patient Details  Name: Micheal Frank MRN: [id number] Date of Birth: Jun 25, 1926   Medicare Important Message Given:  Yes    Kerin Salen 06/27/2017, 11:38 AM

## 2017-06-28 ENCOUNTER — Telehealth: Payer: Self-pay

## 2017-06-28 NOTE — Telephone Encounter (Signed)
thx , will see him in few days

## 2017-06-28 NOTE — Telephone Encounter (Signed)
06/28/17   TCM Hospital follow Up  Transition Care Management Follow-up Telephone Call  ADMISSION DATE: 06/18/17  DISCHARGE DATE: 06/21/17   How have you been since you were released from the hospital? Stiffness in R leg   Do you understand why you were in the hospital?Yes    Do you understand the discharge instrcutions? Yes    Items Reviewed:  Medications reviewed: Yes   Allergies reviewed: Yes   Dietary changes reviewed: Low to No  Sodium per patient   Referrals reviewed: Appointment scheduled during discharge    Functional Questionnaire:   Activities of Daily Living (ADLs): Can perform all independently.  Any patient concerns? Stiffnes in Right leg   Confirmed importance and date/time of follow-up visits scheduled: Yes   Confirmed with patient if condition begins to worsen call PCP or go to the ER. Yes    Patient was given the office number and encouragred to call back with questions or concerns. Yes

## 2017-06-29 ENCOUNTER — Telehealth: Payer: Self-pay | Admitting: Internal Medicine

## 2017-06-29 LAB — CULTURE, BLOOD (ROUTINE X 2)
CULTURE: NO GROWTH
Culture: NO GROWTH
Special Requests: ADEQUATE
Special Requests: ADEQUATE

## 2017-06-29 NOTE — Telephone Encounter (Signed)
Spoke w/ Elta [place], verbal orders given.

## 2017-06-29 NOTE — Telephone Encounter (Signed)
Copied from [place]. Topic: General - Other >> Jun 29, 2017  8:27 AM Darl Householder, RMA wrote: Reason for CRM: Marijean Niemann from [facility] is calling requesting verbal orders for PT 1 time a week x1 week, then 3 times a week x6 weeks starting Jun 28, 2017, please return call to [place] at [phone]

## 2017-07-05 ENCOUNTER — Ambulatory Visit: Payer: Medicare Other | Admitting: Internal Medicine

## 2017-07-05 ENCOUNTER — Telehealth: Payer: Self-pay

## 2017-07-05 ENCOUNTER — Encounter: Payer: Self-pay | Admitting: Internal Medicine

## 2017-07-05 VITALS — BP 124/68 | HR 70 | Temp 98.0°F | Resp 14 | Ht 70.0 in | Wt 145.1 lb

## 2017-07-05 DIAGNOSIS — R634 Abnormal weight loss: Secondary | ICD-10-CM | POA: Diagnosis not present

## 2017-07-05 DIAGNOSIS — R1013 Epigastric pain: Secondary | ICD-10-CM

## 2017-07-05 DIAGNOSIS — N390 Urinary tract infection, site not specified: Secondary | ICD-10-CM | POA: Diagnosis not present

## 2017-07-05 DIAGNOSIS — M8008XS Age-related osteoporosis with current pathological fracture, vertebra(e), sequela: Secondary | ICD-10-CM

## 2017-07-05 DIAGNOSIS — M8088XS Other osteoporosis with current pathological fracture, vertebra(e), sequela: Secondary | ICD-10-CM | POA: Diagnosis not present

## 2017-07-05 MED ORDER — MEGESTROL ACETATE 400 MG/10ML PO SUSP: 400.0000 mg | Freq: Two times a day (BID) | ORAL | 1 refills | Status: DC

## 2017-07-05 MED ORDER — PANTOPRAZOLE SODIUM 40 MG PO TBEC: 40.0000 mg | DELAYED_RELEASE_TABLET | Freq: Every day | ORAL | 6 refills | Status: DC

## 2017-07-05 MED ORDER — FLUTICASONE PROPIONATE 50 MCG/ACT NA SUSP: 2.0000 | Freq: Two times a day (BID) | NASAL | 5 refills | Status: AC

## 2017-07-05 MED ORDER — BUDESONIDE-FORMOTEROL FUMARATE 160-4.5 MCG/ACT IN AERO: 2.0000 | INHALATION_SPRAY | Freq: Two times a day (BID) | RESPIRATORY_TRACT | 5 refills | Status: AC

## 2017-07-05 MED ORDER — RANITIDINE HCL 300 MG PO TABS: 300.0000 mg | ORAL_TABLET | Freq: Every day | ORAL | 6 refills | Status: DC

## 2017-07-05 NOTE — Telephone Encounter (Signed)
Notify pt's daughter, pay cash with the "good Rx card"?

## 2017-07-05 NOTE — Assessment & Plan Note (Signed)
Here with his daughter [name] Pneumonia, UTI: s/p admission, finished Levaquin, doing better, no specific symptoms Vertebral fracture: S/p admission, pain was poorly controlled but is better now with hydrocodone as needed.  Doing PT. Weight loss, decreased appetite: Last hemoglobin normal, last TSH was actually slightly elevated.  Rec to continue booster, trial with megestrol Dyspepsia, belching: We will recommend ranitidine 300 mg daily.  Previously Protonix caused diarrhea, also history of C. difficile. RTC 3 months

## 2017-07-05 NOTE — Telephone Encounter (Signed)
PA initiated via Covermymeds; KEY: [id number]. Awaiting determination.

## 2017-07-05 NOTE — Progress Notes (Signed)
Pre visit review using our clinic review tool, if applicable. No additional management support is needed unless otherwise documented below in the visit note. 

## 2017-07-05 NOTE — Progress Notes (Signed)
Subjective:    Patient ID: Micheal Frank, male    DOB: 09/13/1925, 83 y.o.   MRN: [id number]  DOS:  07/05/2017 Type of visit - description : TCM 14 Interval history: Was admitted to hospital and discharged 06/27/2017 Admitted with progressive weakness, UTI, pneumonia and a T12 fracture with poor pain control. Pain was eventually controlled and he did not require kyphoplasty Pneumonia, UTI: Sepsis was ruled out, CXR no acute, urine culture shows Pseudomonas, was prescribed Levaquin to cover both conditions. The daughter reports that his swallow was tested and he was told it was okay.  Wt Readings from Last 3 Encounters:  07/05/17 145 lb 2 oz (65.8 kg)  06/24/17 145 lb 15.1 oz (66.2 kg)  06/11/17 154 lb 2 oz (69.9 kg)     Review of Systems Since he left the hospital he finished Levaquin and feels better. No fever, chills.  No cough or L UTS. They have noted weight loss, his appetite is not the best, he is however taking to 3 cans of booster qd He is having belching, this is a chronic issue.  Taking OTC PPIs is not helping enough. Denies nausea, vomiting, diarrhea. He is having PT, has a hospital bed.  Well taking care at home. Pain well controlled with pain medication as needed  Past Medical History:  Diagnosis Date  . Arthritis    left leg also has swelling  . Asthma   . Blood dyscrasia    pt CAN NOT have any blood thinners d/t hx brain bleed  . Chronic kidney disease, stage III (moderate) ([place]) 01/20/2013  . Complication of anesthesia    confusion with anesthesia  . Constipation    miralax prn  . COPD (chronic obstructive pulmonary disease) ([facility])    emphysema  . Depression    takes wellbutrin bid  . Epidural abscess   . Gastric ulcer   . History of blood clots   . Hyperlipidemia   . Melanoma ([place])   . Peripheral neuropathy   . Pneumonia    as a child and in 1952;had a pneumonia vaccine couple of years ago  . Prosthetic joint infection ([place])   . S/P right  heart catheterization    at least 55yrs  . Shingles   . Short-term memory loss   . Shortness of breath    with exertion  . Sleep apnea    has CPAP but doesn't use it;sleep study done at least 53yrs ago  . Staphylococcus aureus bacteremia with sepsis ([place])   . Subdural hematoma ([place]) 03/30/11  . Urinary frequency    wears depends    Past Surgical History:  Procedure Laterality Date  . epidural abscess drainage  12/2010  . 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 EYE SURGERY     bil cataract surgery  . FILTERING PROCEDURE     filter placed in neck d/t blood  clot  . HERNIA REPAIR     25+yrs ago  . KNEE ARTHROSCOPY    . SKIN BIOPSY     melanoma on head   . TOTAL KNEE ARTHROPLASTY     x 2 left knee 2003/2012  . TOTAL KNEE REVISION  05/23/2011   Procedure: TOTAL KNEE REVISION;  Surgeon: Meredith Pel;  Location: [facility];  Service: Orthopedics;  Laterality: Left;  LEFT KNEE REMOVAL OF SPACER, POSSIBLE REIMPLANTATION OF REVISION TKA VERSES REPLACEMENT ANTIBIOTIC SPACER    Social History  Socioeconomic History  . Marital status: Widowed    Spouse name: Not on file  . Number of children: 3  . Years of education: Not on file  . Highest education level: Not on file  Social Needs  . Financial resource strain: Not on file  . Food insecurity - worry: Not on file  . Food insecurity - inability: Not on file  . Transportation needs - medical: Not on file  . Transportation needs - non-medical: Not on file  Occupational History  . Occupation: retired  Tobacco Use  . Smoking status: Former Smoker    Packs/day: 2.00    Years: 40.00    Pack years: 80.00    Types: Cigarettes    Last attempt to quit: 07/17/1978    Years since quitting: 38.9  . Smokeless tobacco: Never Used  Substance and Sexual Activity  . Alcohol use: No  . Drug use: No  . Sexual activity: Not on file  Other Topics Concern  . Not on file  Social History Narrative   Lost wife 06-2013    Lives in his house, 1 daughter and her 2 adult children live w/ him (1g-child w/ special needs)      Allergies as of 07/05/2017      Reactions   Anticoagulant Compound    History of bleeding on the brain per daughter   Protonix [pantoprazole Sodium] Diarrhea      Medication List        Accurate as of 07/05/17  6:15 PM. Always use your most recent med list.          acetaminophen 325 MG tablet Commonly known as:  TYLENOL Take 2 tablets (650 mg total) by mouth every 6 (six) hours as needed for mild pain (or Fever >/= 101).   albuterol 108 (90 Base) MCG/ACT inhaler Commonly known as:  PROAIR HFA Inhale 2 puffs into the lungs every 6 (six) hours as needed for wheezing.   AZO CRANBERRY URINARY TRACT 250-60 MG Caps Generic drug:  Cranberry-Vitamin C Take 1 tablet by mouth daily.   benzonatate 200 MG capsule Commonly known as:  TESSALON TAKE ONE CAPSULE 3-4 TIMES DAILY AS NEEDED FOR COUGH   budesonide-formoterol 160-4.5 MCG/ACT inhaler Commonly known as:  SYMBICORT Inhale 2 puffs into the lungs 2 (two) times daily.   buPROPion 200 MG 12 hr tablet Commonly known as:  WELLBUTRIN SR Take 200 mg by mouth 2 (two) times daily.   calcium carbonate 600 MG Tabs tablet Commonly known as:  OS-CAL Take 600 mg by mouth daily.   diazepam 5 MG tablet Commonly known as:  VALIUM Take 1 tablet (5 mg total) by mouth every 6 (six) hours as needed for anxiety.   doxazosin 4 MG tablet Commonly known as:  CARDURA Take 1 tablet (4 mg total) by mouth at bedtime.   fluticasone 50 MCG/ACT nasal spray Commonly known as:  FLONASE Place 2 sprays into both nostrils 2 (two) times daily.   HYDROcodone-acetaminophen 5-325 MG tablet Commonly known as:  NORCO/VICODIN Take 1 tablet every 12 hours as needed for pain.   meclizine 25 MG tablet Commonly known as:  ANTIVERT Take 1 tablet (25 mg total) by mouth daily as needed (vertigo).   megestrol 400 MG/10ML suspension Commonly known as:   MEGACE Take 10 mLs (400 mg total) by mouth 2 (two) times daily.   montelukast 10 MG tablet Commonly known as:  SINGULAIR Take 1 tablet (10 mg total) by mouth daily.   multivitamin with  minerals Tabs tablet Take 1 tablet by mouth daily.   PROBIOTIC PO Take 1 capsule by mouth daily.   ranitidine 300 MG tablet Commonly known as:  ZANTAC Take 1 tablet (300 mg total) by mouth at bedtime.   rOPINIRole 1 MG tablet Commonly known as:  REQUIP Take 1 tablet (1 mg total) by mouth 2 (two) times daily.   saccharomyces boulardii 250 MG capsule Commonly known as:  FLORASTOR Take 1 capsule (250 mg total) by mouth 2 (two) times daily.   simvastatin 40 MG tablet Commonly known as:  ZOCOR Take 1 tablet (40 mg total) by mouth daily.   tamsulosin 0.4 MG Caps capsule Commonly known as:  FLOMAX Take 1 capsule (0.4 mg total) by mouth daily.   triamterene-hydrochlorothiazide 37.5-25 MG tablet Commonly known as:  MAXZIDE-25 Take 0.5 tablets by mouth daily.   URELLE 81 MG Tabs tablet Take 1 tablet (81 mg total) by mouth every 6 (six) hours as needed for bladder spasms.          Objective:   Physical Exam BP 124/68 (BP Location: Left Arm, Patient Position: Sitting, Cuff Size: Small)   Pulse 70   Temp 98 F (36.7 C) (Oral)   Resp 14   Ht 5\' 10"  (1.778 m)   Wt 145 lb 2 oz (65.8 kg)   SpO2 94%   BMI 20.82 kg/m  General:   Well developed, elderly gentleman, in no distress  HEENT:  Normocephalic . Face symmetric, atraumatic Lungs:  CTA B Normal respiratory effort, no intercostal retractions, no accessory muscle use. Heart: RRR, question of soft systolic murmur   no pretibial edema bilaterally  Abdomen:  Not distended, soft, non-tender.  Skin: Not pale. Not jaundice Neurologic:  alert & oriented X3.  Speech normal, gait appropriate for age assisted by a walker Psych--  Cognition and judgment appear intact.  Cooperative with normal attention span and concentration.  Behavior  appropriate. No anxious or depressed appearing.     Assessment & Plan:  Assessment   COPD- Dr Melvyn Novas 08-2015, stable, RTC PRN CKD -----Creatinine 1.67, GFR 32 Hyperlipidemia Depression Dyspepsia: Chronic, declined a GI referral in 2017 after sxs decreased with PPIs empirically NEURO: --Decreased memory --Anisocoria- L pupil larger ,s/p surgery --Neuropathy --H/o Mnire's  -- on diuretics , valium, meclizine Osteoporosis -- h/o pelvic Fx, dexa April 2016: T score (-) 2.3;  rx fosamax 03-2015, dc after 3 months d/t dysphagia; prolia #1 05-03-16 DJD  -- hydrocodone rarely RLS OSA dx remotely, used a CPAP, wt loss and cpap d/c ~ 2012 (Dr Maxwell Caul) GU: --H/o incontinence, saw Dr Karsten Ro, OV ~ 2013 --Urinary retention 02-2016: sepsis, admitted, had a foley temporarily  H/o melanoma H/o subdural hematoma, epidural abscess, staph aureus  sepsis H/o Prosthetic joint infection H/o Blood clots H/o gastric ulcer C diff dx 10-19-15, s/p flagyl x 2 , diarrhea again 03-2016, better after vancomycin. Coordinate care with his daughter Micheal Frank  [phone]  PLAN:  Here with his daughter Micheal Frank Pneumonia, UTI: s/p admission, finished Levaquin, doing better, no specific symptoms Vertebral fracture: S/p admission, pain was poorly controlled but is better now with hydrocodone as needed.  Doing PT. Weight loss, decreased appetite: Last hemoglobin normal, last TSH was actually slightly elevated.  Rec to continue booster, trial with megestrol Dyspepsia, belching: We will recommend ranitidine 300 mg daily.  Previously Protonix caused diarrhea, also history of C. difficile. RTC 3 months

## 2017-07-05 NOTE — Patient Instructions (Addendum)
  GO TO THE FRONT DESK Schedule your next appointment for a checkup in 3 months  Megestrol: Twice a day as needed to increase appetite  For belching: Ranitidine 300 mg 1 tablet at bedtime

## 2017-07-05 NOTE — Telephone Encounter (Signed)
PA denied. Denied because medication is not being prescribed for a FDA labeled or medically accepted use. Dx: Anorexia (decreased appetite). Please advise.

## 2017-07-06 NOTE — Telephone Encounter (Signed)
Spoke w/ [name], informed her of PA denial. Megace w/o insurance is approximately $300, Pt can't use coupon cards because he is Medicare. Pt is already using Boost OTC, [name] didn't know if you could recommend anything else?

## 2017-07-09 ENCOUNTER — Telehealth: Payer: Self-pay | Admitting: *Deleted

## 2017-07-09 NOTE — Telephone Encounter (Signed)
Received Home [employer] Plan of Care; forwarded to provider/SLS 12/24  

## 2017-07-09 NOTE — Telephone Encounter (Signed)
Maybe try another pharmacy? [facility] in [place]?

## 2017-07-12 ENCOUNTER — Other Ambulatory Visit: Payer: Self-pay | Admitting: Internal Medicine

## 2017-07-12 NOTE — Telephone Encounter (Signed)
thx

## 2017-07-12 NOTE — Telephone Encounter (Signed)
Spoke w/ [facility]- cash price is ~ $53 vs $300 at [facility]. Spoke w/ [name], for now she will continue w/ Boost several times daily and will let us know if she would like [place] to send downstairs.

## 2017-07-13 NOTE — Telephone Encounter (Signed)
Plan of care signed and faxed back to [facility] at [phone]. Form sent for scanning.  

## 2017-07-23 ENCOUNTER — Ambulatory Visit: Payer: Self-pay | Admitting: *Deleted

## 2017-07-23 ENCOUNTER — Telehealth: Payer: Self-pay

## 2017-07-23 ENCOUNTER — Emergency Department (HOSPITAL_BASED_OUTPATIENT_CLINIC_OR_DEPARTMENT_OTHER)
Admission: EM | Admit: 2017-07-23 | Discharge: 2017-07-23 | Disposition: A | Discharge status: Home / Self Care | Payer: Medicare Other | Attending: Emergency Medicine | Admitting: Emergency Medicine

## 2017-07-23 ENCOUNTER — Ambulatory Visit: Payer: Medicare Other | Admitting: Internal Medicine

## 2017-07-23 ENCOUNTER — Encounter (HOSPITAL_BASED_OUTPATIENT_CLINIC_OR_DEPARTMENT_OTHER): Payer: Self-pay | Admitting: *Deleted

## 2017-07-23 ENCOUNTER — Other Ambulatory Visit: Payer: Self-pay

## 2017-07-23 DIAGNOSIS — Z96652 Presence of left artificial knee joint: Secondary | ICD-10-CM | POA: Insufficient documentation

## 2017-07-23 DIAGNOSIS — R1030 Lower abdominal pain, unspecified: Secondary | ICD-10-CM | POA: Diagnosis present

## 2017-07-23 DIAGNOSIS — Z79899 Other long term (current) drug therapy: Secondary | ICD-10-CM | POA: Insufficient documentation

## 2017-07-23 DIAGNOSIS — J45909 Unspecified asthma, uncomplicated: Secondary | ICD-10-CM | POA: Diagnosis not present

## 2017-07-23 DIAGNOSIS — Z87891 Personal history of nicotine dependence: Secondary | ICD-10-CM | POA: Diagnosis not present

## 2017-07-23 DIAGNOSIS — N183 Chronic kidney disease, stage 3 (moderate): Secondary | ICD-10-CM | POA: Diagnosis not present

## 2017-07-23 DIAGNOSIS — R300 Dysuria: Secondary | ICD-10-CM | POA: Diagnosis not present

## 2017-07-23 DIAGNOSIS — R339 Retention of urine, unspecified: Secondary | ICD-10-CM | POA: Insufficient documentation

## 2017-07-23 DIAGNOSIS — J449 Chronic obstructive pulmonary disease, unspecified: Secondary | ICD-10-CM | POA: Diagnosis not present

## 2017-07-23 LAB — URINALYSIS, ROUTINE W REFLEX MICROSCOPIC
BILIRUBIN URINE: NEGATIVE
Glucose, UA: NEGATIVE mg/dL
Hgb urine dipstick: NEGATIVE
Ketones, ur: NEGATIVE mg/dL
Leukocytes, UA: NEGATIVE
NITRITE: NEGATIVE
PROTEIN: NEGATIVE mg/dL
SPECIFIC GRAVITY, URINE: 1.02 (ref 1.005–1.030)
pH: 7 (ref 5.0–8.0)

## 2017-07-23 LAB — COMPREHENSIVE METABOLIC PANEL
ALBUMIN: 3.5 g/dL (ref 3.5–5.0)
ALK PHOS: 77 U/L (ref 38–126)
ALT: 8 U/L — ABNORMAL LOW (ref 17–63)
ANION GAP: 8 (ref 5–15)
AST: 25 U/L (ref 15–41)
BILIRUBIN TOTAL: 0.6 mg/dL (ref 0.3–1.2)
BUN: 27 mg/dL — AB (ref 6–20)
CALCIUM: 8.9 mg/dL (ref 8.9–10.3)
CO2: 26 mmol/L (ref 22–32)
Chloride: 105 mmol/L (ref 101–111)
Creatinine, Ser: 1.45 mg/dL — ABNORMAL HIGH (ref 0.61–1.24)
GFR calc Af Amer: 48 mL/min — ABNORMAL LOW (ref >60)
GFR calc non Af Amer: 41 mL/min — ABNORMAL LOW (ref >60)
GLUCOSE: 88 mg/dL (ref 65–99)
POTASSIUM: 3.2 mmol/L — AB (ref 3.5–5.1)
SODIUM: 139 mmol/L (ref 135–145)
Total Protein: 6.5 g/dL (ref 6.5–8.1)

## 2017-07-23 LAB — CBC WITH DIFFERENTIAL/PLATELET
BASOS ABS: 0 10*3/uL (ref 0.0–0.1)
BASOS PCT: 0 %
Eosinophils Absolute: 0.2 10*3/uL (ref 0.0–0.7)
Eosinophils Relative: 2 %
HEMATOCRIT: 42.8 % (ref 39.0–52.0)
HEMOGLOBIN: 14.2 g/dL (ref 13.0–17.0)
Lymphocytes Relative: 14 %
Lymphs Abs: 1.3 10*3/uL (ref 0.7–4.0)
MCH: 31.2 pg (ref 26.0–34.0)
MCHC: 33.2 g/dL (ref 30.0–36.0)
MCV: 94.1 fL (ref 78.0–100.0)
MONOS PCT: 13 %
Monocytes Absolute: 1.1 10*3/uL — ABNORMAL HIGH (ref 0.1–1.0)
NEUTROS ABS: 6.4 10*3/uL (ref 1.7–7.7)
NEUTROS PCT: 71 %
Platelets: 221 10*3/uL (ref 150–400)
RBC: 4.55 MIL/uL (ref 4.22–5.81)
RDW: 14.2 % (ref 11.5–15.5)
WBC: 9 10*3/uL (ref 4.0–10.5)

## 2017-07-23 LAB — LIPASE, BLOOD: Lipase: 20 U/L (ref 11–51)

## 2017-07-23 NOTE — ED Triage Notes (Signed)
Pt has not voided since yesterday afternoon. Was here in December for the same thing. Pt was transferred to [facility] for UTI per patient.

## 2017-07-23 NOTE — ED Provider Notes (Signed)
[facility] EMERGENCY DEPARTMENT Provider Note   CSN: [id number] Arrival date & time: 07/23/17  1039     History   Chief Complaint Chief Complaint  Patient presents with  . Urinary Retention    HPI Micheal Frank is a 84 y.o. male.  The history is provided by the patient and medical records. No language interpreter was used.  Abdominal Pain   This is a new problem. The current episode started yesterday. The problem occurs constantly. The problem has not changed since onset.The pain is associated with an unknown factor. The pain is located in the suprapubic region. The quality of the pain is aching. The pain is mild. Associated symptoms include dysuria. Pertinent negatives include fever, melena, nausea, vomiting, constipation, hematuria and headaches. Nothing aggravates the symptoms. Nothing relieves the symptoms.    Past Medical History:  Diagnosis Date  . Arthritis    left leg also has swelling  . Asthma   . Blood dyscrasia    pt CAN NOT have any blood thinners d/t hx brain bleed  . Chronic kidney disease, stage III (moderate) ([place]) 01/20/2013  . Complication of anesthesia    confusion with anesthesia  . Constipation    miralax prn  . COPD (chronic obstructive pulmonary disease) (HCC)    emphysema  . Depression    takes wellbutrin bid  . Epidural abscess   . Gastric ulcer   . History of blood clots   . Hyperlipidemia   . Melanoma ([place])   . Peripheral neuropathy   . Pneumonia    as a child and in 1952;had a pneumonia vaccine couple of years ago  . Prosthetic joint infection ([place])   . S/P right heart catheterization    at least 81yrs  . Shingles   . Short-term memory loss   . Shortness of breath    with exertion  . Sleep apnea    has CPAP but doesn't use it;sleep study done at least 54yrs ago  . Staphylococcus aureus bacteremia with sepsis ([place])   . Subdural hematoma ([place]) 03/30/11  . Urinary frequency    wears depends    Patient Active Problem  List   Diagnosis Date Noted  . CAP (community acquired pneumonia) 06/25/2017  . UTI (urinary tract infection) 06/24/2017  . Sepsis ([place]) 06/24/2017  . Compression fracture of T12 vertebra ([place]) 06/24/2017  . Generalized weakness 06/24/2017  . Presence of left artificial knee joint 07/24/2016  . Chronic pain of left knee 07/24/2016  . Chronic midline low back pain without sciatica 07/24/2016  . Depression 07/09/2016  . C. difficile diarrhea 05/04/2016  . Chronic cough 08/16/2015  . PCP NOTES >>>>>> 04/06/2015  . CTS (carpal tunnel syndrome) 11/16/2014  . Hyperlipidemia 08/17/2014  . Superficial bruising of chest wall 07/29/2014  . Urgency incontinence 01/23/2013  . Chronic kidney disease, stage III (moderate) ([place]) 01/20/2013  . At high risk for falls 11/28/2012  . ALLERGIC RHINITIS 12/09/2009  . Carcinoma in situ of skin 04/30/2008  . RESTLESS LEG SYNDROME 04/30/2008  . NEUROPATHY 04/30/2008  . GLAUCOMA, BORDERLINE 04/30/2008  . Meniere's disease 04/30/2008  . HEARING LOSS 04/30/2008  . COPD GOLD II  04/30/2008  . DJD (degenerative joint disease) 04/30/2008  . Osteoporosis 04/30/2008  . Sleep apnea 04/30/2008  . HISTORY OF ASBESTOS EXPOSURE 04/30/2008    Past Surgical History:  Procedure Laterality Date  . epidural abscess drainage  12/2010  . EXTERNAL EAR SURGERY     shunt placed in right ear d/t  mennie[place]   . EYE SURGERY     bil cataract surgery  . FILTERING PROCEDURE     filter placed in neck d/t blood  clot  . HERNIA REPAIR     25+yrs ago  . KNEE ARTHROSCOPY    . SKIN BIOPSY     melanoma on head   . TOTAL KNEE ARTHROPLASTY     x 2 left knee 2003/2012  . TOTAL KNEE REVISION  05/23/2011   Procedure: TOTAL KNEE REVISION;  Surgeon: Meredith Pel;  Location: [facility];  Service: Orthopedics;  Laterality: Left;  LEFT KNEE REMOVAL OF SPACER, POSSIBLE REIMPLANTATION OF REVISION TKA VERSES REPLACEMENT ANTIBIOTIC SPACER       Home Medications    Prior to Admission  medications   Medication Sig Start Date End Date Taking? Authorizing Provider  acetaminophen (TYLENOL) 325 MG tablet Take 2 tablets (650 mg total) by mouth every 6 (six) hours as needed for mild pain (or Fever >/= 101). 02/22/16   Regalado, Belkys A, MD  albuterol (PROAIR HFA) 108 (90 BASE) MCG/ACT inhaler Inhale 2 puffs into the lungs every 6 (six) hours as needed for wheezing. 07/31/14   Elsie Stain, MD  benzonatate (TESSALON) 200 MG capsule TAKE ONE CAPSULE 3-4 TIMES DAILY AS NEEDED FOR COUGH Patient taking differently: Take 200 mg by mouth 2 (two) times daily as needed for cough. TAKE ONE CAPSULE 3-4 TIMES DAILY AS NEEDED FOR COUGH 08/16/15   Tanda Rockers, MD  budesonide-formoterol [facility]) 160-4.5 MCG/ACT inhaler Inhale 2 puffs into the lungs 2 (two) times daily. 07/05/17   Colon Branch, MD  buPROPion [facility] SR) 200 MG 12 hr tablet Take 200 mg by mouth 2 (two) times daily.    [provider]  calcium carbonate (OS-CAL) 600 MG TABS Take 600 mg by mouth daily.     [provider]  Cranberry-Vitamin C (AZO CRANBERRY URINARY TRACT) 250-60 MG CAPS Take 1 tablet by mouth daily.    [provider]  diazepam (VALIUM) 5 MG tablet Take 1 tablet (5 mg total) by mouth every 6 (six) hours as needed for anxiety. 03/01/16   Colon Branch, MD  doxazosin (CARDURA) 4 MG tablet Take 1 tablet (4 mg total) by mouth at bedtime. 11/06/16   Colon Branch, MD  fluticasone [facility]) 50 MCG/ACT nasal spray Place 2 sprays into both nostrils 2 (two) times daily. 07/05/17   Colon Branch, MD  HYDROcodone-acetaminophen (NORCO/VICODIN) 5-325 MG tablet Take 1 tablet every 12 hours as needed for pain. Patient taking differently: Take 1 tablet by mouth every 12 (twelve) hours as needed for moderate pain or severe pain.  06/05/17   Colon Branch, MD  meclizine (ANTIVERT) 25 MG tablet Take 1 tablet (25 mg total) by mouth daily as needed (vertigo). 03/01/16   Colon Branch, MD  megestrol (MEGACE) 400  MG/10ML suspension Take 10 mLs (400 mg total) by mouth 2 (two) times daily. 07/05/17   Colon Branch, MD  montelukast (SINGULAIR) 10 MG tablet Take 1 tablet (10 mg total) by mouth daily. 09/20/16   Colon Branch, MD  Multiple Vitamin (MULTIVITAMIN WITH MINERALS) TABS Take 1 tablet by mouth daily.    [provider]  Probiotic Product (PROBIOTIC PO) Take 1 capsule by mouth daily.    [provider]  ranitidine (ZANTAC) 300 MG tablet Take 1 tablet (300 mg total) by mouth at bedtime. 07/05/17   Colon Branch, MD  rOPINIRole (REQUIP) 1 MG  tablet Take 1 tablet (1 mg total) by mouth 2 (two) times daily. 11/08/16   Colon Branch, MD  saccharomyces boulardii (FLORASTOR) 250 MG capsule Take 1 capsule (250 mg total) by mouth 2 (two) times daily. 03/09/16   Colon Branch, MD  simvastatin (ZOCOR) 40 MG tablet Take 1 tablet (40 mg total) by mouth daily. 09/20/16   Colon Branch, MD  tamsulosin (FLOMAX) 0.4 MG CAPS capsule Take 1 capsule (0.4 mg total) by mouth daily. 09/20/16   Colon Branch, MD  triamterene-hydrochlorothiazide [facility]) 37.5-25 MG tablet Take 1/2 tablet by mouth daily 07/13/17   Colon Branch, MD  URELLE (URELLE/URISED) 81 MG TABS tablet Take 1 tablet (81 mg total) by mouth every 6 (six) hours as needed for bladder spasms. 02/22/16   Regalado, Cassie Freer, MD    Family History Family History  Problem Relation Age of Onset  . Melanoma Brother   . CAD Neg Hx   . Diabetes Neg Hx     Social History Social History   Tobacco Use  . Smoking status: Former Smoker    Packs/day: 2.00    Years: 40.00    Pack years: 80.00    Types: Cigarettes    Last attempt to quit: 07/17/1978    Years since quitting: 39.0  . Smokeless tobacco: Never Used  Substance Use Topics  . Alcohol use: No  . Drug use: No     Allergies   Anticoagulant compound and Protonix [pantoprazole sodium]   Review of Systems Review of Systems  Constitutional: Negative for chills, diaphoresis, fatigue and fever.  HENT:  Negative for congestion.   Respiratory: Negative for cough, chest tightness, shortness of breath, wheezing and stridor.   Cardiovascular: Negative for chest pain.  Gastrointestinal: Positive for abdominal pain. Negative for constipation, melena, nausea and vomiting.  Genitourinary: Positive for decreased urine volume, difficulty urinating and dysuria. Negative for flank pain and hematuria.  Musculoskeletal: Negative for back pain, neck pain and neck stiffness.  Neurological: Negative for light-headedness, numbness and headaches.  Psychiatric/Behavioral: Negative for agitation.  All other systems reviewed and are negative.    Physical Exam Updated Vital Signs BP 116/77 (BP Location: Right Arm)   Pulse (!) 101   Temp 98.1 F (36.7 C) (Oral)   Resp 18   Ht 5' 9.5" (1.765 m)   Wt 63 kg (139 lb)   SpO2 95%   BMI 20.23 kg/m   Physical Exam  Constitutional: He is oriented to person, place, and time. He appears well-developed and well-nourished. No distress.  HENT:  Head: Normocephalic and atraumatic.  Mouth/Throat: Oropharynx is clear and moist. No oropharyngeal exudate.  Eyes: Conjunctivae and EOM are normal. Pupils are equal, round, and reactive to light.  Neck: Normal range of motion.  Cardiovascular: Normal rate and intact distal pulses.  No murmur heard. Pulmonary/Chest: Effort normal and breath sounds normal. No respiratory distress. He has no wheezes. He has no rales. He exhibits no tenderness.  Abdominal: Soft. Bowel sounds are normal. He exhibits no distension. There is no tenderness. There is no rigidity, no rebound, no guarding and no CVA tenderness.  Musculoskeletal: He exhibits no tenderness.  Neurological: He is alert and oriented to person, place, and time. No sensory deficit. He exhibits normal muscle tone.  Skin: Capillary refill takes less than 2 seconds. He is not diaphoretic. No erythema. No pallor.  Nursing note and vitals reviewed.    ED Treatments / Results    Labs (all labs ordered  are listed, but only abnormal results are displayed) Labs Reviewed  CBC WITH DIFFERENTIAL/PLATELET - Abnormal; Notable for the following components:      Result Value   Monocytes Absolute 1.1 (*)    All other components within normal limits  COMPREHENSIVE METABOLIC PANEL - Abnormal; Notable for the following components:   Potassium 3.2 (*)    BUN 27 (*)    Creatinine, Ser 1.45 (*)    ALT 8 (*)    GFR calc non Af Amer 41 (*)    GFR calc Af Amer 48 (*)    All other components within normal limits  URINE CULTURE  LIPASE, BLOOD  URINALYSIS, ROUTINE W REFLEX MICROSCOPIC    EKG  EKG Interpretation None       Radiology No results found.  Procedures Procedures (including critical care time)  Medications Ordered in ED Medications - No data to display   Initial Impression / Assessment and Plan / ED Course  I have reviewed the triage vital signs and the nursing notes.  Pertinent labs & imaging results that were available during my care of the patient were reviewed by me and considered in my medical decision making (see chart for details).     Micheal Frank is a 84 y.o. male with a past medical history significant for prior urinary retention, COPD, asthma, prior UTI, prior pneumonias, subdural hematoma, and epidural abscesses who presents with urinary retention.  Patient reports that he has not urinated since yesterday and is feeling pressure in his lower abdomen.  He describes it feeling similar to when he could not urinate.  He reported some dysuria yesterday but has not been able to urinate today.  He denies fevers, chills, nausea, vomiting, chest pain, or upper abdominal pain.  He reports his pain is mild and low however previously he had to get admitted for retention and infection and wanted to get evaluated before progress to that point.  Patient is accompanied by family who brought him in for evaluation.  Patient is denying severe back pain, bowel  incontinence, or any recent injuries.  He denies any chest pain, shortness of breath, or other symptoms.    Patient denies any penile pain, scrotal pain, or any lesions.  On exam, abdomen is nontender.  Patient has no CVA tenderness.  No flank tenderness.  Exam overall unremarkable.  Bladder scan was performed showing greater than 500 the patient's retention, a Foley catheter was placed.  Laboratory testing was collected including a urine analysis and urine culture.    Urinalysis did not show evidence of infection but culture was still sent.  Other laboratory testing was overall reassuring.  Vital signs were also reassuring after Foley catheter was placed and his pain improved.  Patient had mild hypokalemia which patient was informed of.    Given resolution of retention with Foley catheter placement, patient was felt stable for discharge home given reassuring vital signs and labs.  Patient will follow up with urology whom the family is going to ask PCP for referral.  Patient's creatinine is slightly elevated but similar to prior values.  Patient and family all feel comfortable patient going home with his new Foley.  Patient will follow with urology.  They understood return precautions for any new or worsened symptoms.  Patient and family had no other questions or concerns and patient was discharged in good condition with resolution of urinary retention.   Final Clinical Impressions(s) / ED Diagnoses   Final diagnoses:  Urinary retention  ED Discharge Orders    None      Clinical Impression: 1. Urinary retention     Disposition: Discharge  Condition: Good  I have discussed the results, Dx and Tx plan with the pt(& family if present). He/she/they expressed understanding and agree(s) with the plan. Discharge instructions discussed at great length. Strict return precautions discussed and pt &/or family have verbalized understanding of the instructions. No further questions at time of  discharge.    This SmartLink is deprecated. Use AVSMEDLIST instead to display the medication list for a patient.  Follow Up: Colon Branch, [address] [place] [address] [phone]     [place] [address] [id number] [place] [facility] [place] [phone]       Mable Dara, Gwenyth Allegra, MD 07/23/17 Tresa Moore

## 2017-07-23 NOTE — Telephone Encounter (Signed)
Pt's daughter [name] called stating that her father han had burning with urination starting yesterday, and he has not urinated since yesterday; she states that he refuses to go to ED but as a 1320 appointment today with Dr Larose Kells; conference call initiated with Carolee Rota at [facility] she instructed pt and his daughter that per Dr Larose Kells, he needs to go to ED; they verbalize understanding and will follow the recommendation; they would also like to keep the 1320 appointment for follow up.       lReason for Disposition . [1] Unable to urinate (or only a few drops) > 4 hours AND     [2] bladder feels very full (e.g., palpable bladder or strong urge to urinate)  Answer Assessment - Initial Assessment Questions 1. SYMPTOM: "What's the main symptom you're concerned about?" (e.g., frequency, incontinence)     Burning with urination 2. ONSET: "When did the  ________  start?"     07/22/17 3. PAIN: "Is there any pain?" If so, ask: "How bad is it?" (Scale: 1-10; mild, moderate, severe)     no 4. CAUSE: "What do you think is causing the symptoms?"     ? uti 5. OTHER SYMPTOMS: "Do you have any other symptoms?" (e.g., fever, flank pain, blood in urine, pain with urination)    Retention; has not voided since yesterday 6. PREGNANCY: "Is there any chance you are pregnant?" "When was your last menstrual period?"     n/a  Protocols used: URINARY [facility]

## 2017-07-23 NOTE — ED Notes (Signed)
NAD at this time. Pt is stable and going home.  

## 2017-07-23 NOTE — Telephone Encounter (Signed)
Pt and daughter called in stating that the Pt can not void. Last urination was yesterday afternoon. Advised Pt to go to the ER per PCP's advice. Pt seemed reluctant however stated he would go. Pt asked if the appointment for 1330 today should be kept and I advised her (Pt s Daughter) to do what she deemed fit. Pt states she will call after the visit to either cancel or to come in as a follow up visit.

## 2017-07-23 NOTE — Discharge Instructions (Signed)
Please please speak with your PCP to discuss follow-up for your Foley catheter placement.  Either follow-up with your PCP or with the urologist they recommend.  If any symptoms change or worsen, please return to the nearest emergency department.

## 2017-07-24 ENCOUNTER — Telehealth: Payer: Self-pay

## 2017-07-24 ENCOUNTER — Telehealth: Payer: Self-pay | Admitting: Internal Medicine

## 2017-07-24 ENCOUNTER — Telehealth: Payer: Self-pay | Admitting: *Deleted

## 2017-07-24 DIAGNOSIS — R339 Retention of urine, unspecified: Secondary | ICD-10-CM

## 2017-07-24 DIAGNOSIS — Z960 Presence of urogenital implants: Secondary | ICD-10-CM

## 2017-07-24 LAB — URINE CULTURE: CULTURE: NO GROWTH

## 2017-07-24 NOTE — Telephone Encounter (Signed)
Copied from [place] [phone]. Topic: General - Other >> Jul 24, 2017  8:06 AM Lolita Rieger, RMA wrote: Reason for CRM: pt daughter called and stated that her father would like to go to [place] [facility] [phone] for his urology appointment

## 2017-07-24 NOTE — Telephone Encounter (Signed)
Referral placed.

## 2017-07-24 NOTE — Addendum Note (Signed)
Addended byDamita Dunnings D on: 07/24/2017 02:15 PM   Modules accepted: Orders

## 2017-07-24 NOTE — Telephone Encounter (Signed)
thx

## 2017-07-24 NOTE — Telephone Encounter (Signed)
FYI

## 2017-07-24 NOTE — Telephone Encounter (Signed)
Copied from [place] [phone]. Topic: Inquiry >> Jul 24, 2017 11:20 AM Conception Chancy, NT wrote: Reason for CRM: Elta Guadeloupe is a PT with [facility] he is calling stating the patient and family is requesting to hold physical therapy due to UTI and catheter placements.   Elta Guadeloupe [phone]

## 2017-07-24 NOTE — Telephone Encounter (Signed)
Agree 

## 2017-07-24 NOTE — Telephone Encounter (Signed)
Urology referral placed

## 2017-07-24 NOTE — Telephone Encounter (Signed)
Copied from [place] [phone]. Topic: Referral - Request >> Jul 24, 2017  1:48 PM Yvette Rack wrote: Reason for CRM: patient daughter Herbert Deaner [phone] is calling wanting someone a Nurse from [facility] to come in to help her father with his catheter

## 2017-07-24 NOTE — Telephone Encounter (Signed)
Please arrange the referral 

## 2017-07-24 NOTE — Telephone Encounter (Signed)
Please advise 

## 2017-07-27 ENCOUNTER — Telehealth: Payer: Self-pay | Admitting: *Deleted

## 2017-07-27 NOTE — Telephone Encounter (Signed)
Received Physician Orders from [place]; forwarded to provider/SLS 01/11

## 2017-07-31 ENCOUNTER — Telehealth: Payer: Self-pay | Admitting: *Deleted

## 2017-07-31 ENCOUNTER — Telehealth: Payer: Self-pay | Admitting: Internal Medicine

## 2017-07-31 NOTE — Telephone Encounter (Signed)
Pt went to the ER ~ 1 week ago, had urinary retention, urine culture was negative, creatinine 1.45 only slight above baseline, I spoke with Micheal Frank, the patient's daughter today, they are unable to see a urologist in [place].  Patient is very impatient about getting the catheter out. Spoke with the triage nurse @ [facility], they will see the patient for a voiding trial this Thursday   08-02-2017 @ 9 AM, provider will be Jiles Crocker.  Appreciate [facility] help!. Please let Micheal Frank know about the appointment.

## 2017-07-31 NOTE — Telephone Encounter (Signed)
Copied from [place] [id number]. Topic: Inquiry >> Jul 31, 2017  2:46 PM Scherrie Gerlach wrote: Reason for CRM: Micheal Frank with [facility] states the [place] [facility] cannot see pt until the first week in Feb. Would like you to call [facility] urology, Dr Lupita Leash, or get any urologist at that office for a sooner appt Pt wants that caterer out, and want to be seen asap. Micheal Frank states she saw him Sat, pt still had enlarged prostate.   Verbal orders Micheal Frank is wanting nursing orders for foley care, teaching and maintenance. Nursing assessment as well.  Ok to leave message

## 2017-07-31 NOTE — Telephone Encounter (Signed)
Spoke w/ [name], informed of appt w/ [facility] on 08/02/2017 at 0900.

## 2017-07-31 NOTE — Telephone Encounter (Signed)
[facility] unable to see Pt until Feb, Pt wanting catheter out. Pt's daughter requesting you call to get him in sooner?

## 2017-07-31 NOTE — Telephone Encounter (Signed)
Received Physician Orders from [place]; forwarded to provider/SLS 01/15

## 2017-07-31 NOTE — Telephone Encounter (Signed)
[place] of urology appt- verbal orders given for nursing assessment.

## 2017-08-02 NOTE — Telephone Encounter (Signed)
Form signed and faxed to [facility]- [phone]. Form sent for scanning.

## 2017-08-13 ENCOUNTER — Ambulatory Visit: Payer: Medicare Other | Admitting: Internal Medicine

## 2017-09-15 ENCOUNTER — Other Ambulatory Visit: Payer: Self-pay | Admitting: Internal Medicine

## 2017-09-26 ENCOUNTER — Encounter: Payer: Self-pay | Admitting: Internal Medicine

## 2017-09-26 ENCOUNTER — Ambulatory Visit: Payer: Medicare Other | Admitting: Internal Medicine

## 2017-09-26 VITALS — BP 126/68 | HR 63 | Temp 98.0°F | Resp 16 | Ht 70.0 in | Wt 151.1 lb

## 2017-09-26 DIAGNOSIS — R339 Retention of urine, unspecified: Secondary | ICD-10-CM

## 2017-09-26 DIAGNOSIS — N183 Chronic kidney disease, stage 3 unspecified: Secondary | ICD-10-CM

## 2017-09-26 DIAGNOSIS — R1013 Epigastric pain: Secondary | ICD-10-CM

## 2017-09-26 DIAGNOSIS — R634 Abnormal weight loss: Secondary | ICD-10-CM | POA: Diagnosis not present

## 2017-09-26 DIAGNOSIS — R7989 Other specified abnormal findings of blood chemistry: Secondary | ICD-10-CM | POA: Diagnosis not present

## 2017-09-26 DIAGNOSIS — J449 Chronic obstructive pulmonary disease, unspecified: Secondary | ICD-10-CM

## 2017-09-26 LAB — BASIC METABOLIC PANEL
BUN: 24 mg/dL — AB (ref 6–23)
CHLORIDE: 104 meq/L (ref 96–112)
CO2: 30 meq/L (ref 19–32)
CREATININE: 1.53 mg/dL — AB (ref 0.40–1.50)
Calcium: 9.6 mg/dL (ref 8.4–10.5)
GFR: 45.66 mL/min — ABNORMAL LOW (ref >60.00)
Glucose, Bld: 80 mg/dL (ref 70–99)
Potassium: 4.5 mEq/L (ref 3.5–5.1)
Sodium: 142 mEq/L (ref 135–145)

## 2017-09-26 NOTE — Patient Instructions (Signed)
GO TO THE LAB : Get the blood work     GO TO THE FRONT DESK Schedule your next appointment for a  Check up in 4-6 months    

## 2017-09-26 NOTE — Progress Notes (Signed)
Pre visit review using our clinic review tool, if applicable. No additional management support is needed unless otherwise documented below in the visit note. 

## 2017-09-26 NOTE — Progress Notes (Signed)
Subjective:    Patient ID: Micheal Frank, male    DOB: 05/28/1926, 84 y.o.   MRN: [id number]  DOS:  09/26/2017 Type of visit - description : f/u Interval history: In general feeling well. Good compliance with medications. Appetite definitely improved. No weight loss Went to the ER with urinary retention, ER note reviewed.  Subsequently saw urology Patient reports right hand swelling, right toes swelling.  Patient's daughter not convinced that is the case  Wt Readings from Last 3 Encounters:  09/26/17 151 lb 2 oz (68.5 kg)  07/23/17 139 lb (63 kg)  07/05/17 145 lb 2 oz (65.8 kg)      Review of Systems Denies fever chills No dysuria, gross hematuria difficulty urinating.   Past Medical History:  Diagnosis Date  . Arthritis    left leg also has swelling  . Asthma   . Blood dyscrasia    pt CAN NOT have any blood thinners d/t hx brain bleed  . Chronic kidney disease, stage III (moderate) ([facility]) 01/20/2013  . Complication of anesthesia    confusion with anesthesia  . Constipation    miralax prn  . COPD (chronic obstructive pulmonary disease) ([facility])    emphysema  . Depression    takes wellbutrin bid  . Epidural abscess   . Gastric ulcer   . History of blood clots   . Hyperlipidemia   . Melanoma ([place])   . Peripheral neuropathy   . Pneumonia    as a child and in 1952;had a pneumonia vaccine couple of years ago  . Prosthetic joint infection ([place])   . S/P right heart catheterization    at least 21yrs  . Shingles   . Short-term memory loss   . Shortness of breath    with exertion  . Sleep apnea    has CPAP but doesn't use it;sleep study done at least 63yrs ago  . Staphylococcus aureus bacteremia with sepsis ([place])   . Subdural hematoma ([place]) 03/30/11  . Urinary frequency    wears depends    Past Surgical History:  Procedure Laterality Date  . epidural abscess drainage  12/2010  . 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 EYE SURGERY       bil cataract surgery  . FILTERING PROCEDURE     filter placed in neck d/t blood  clot  . HERNIA REPAIR     25+yrs ago  . KNEE ARTHROSCOPY    . SKIN BIOPSY     melanoma on head   . TOTAL KNEE ARTHROPLASTY     x 2 left knee 2003/2012  . TOTAL KNEE REVISION  05/23/2011   Procedure: TOTAL KNEE REVISION;  Surgeon: Meredith Pel;  Location: [facility];  Service: Orthopedics;  Laterality: Left;  LEFT KNEE REMOVAL OF SPACER, POSSIBLE REIMPLANTATION OF REVISION TKA VERSES REPLACEMENT ANTIBIOTIC SPACER    Social History   Socioeconomic History  . Marital status: Widowed    Spouse name: Not on file  . Number of children: 3  . Years of education: Not on file  . Highest education level: Not on file  Social Needs  . Financial resource strain: Not on file  . Food insecurity - worry: Not on file  . Food insecurity - inability: Not on file  . Transportation needs - medical: Not on file  . Transportation needs - non-medical: Not on file  Occupational History  . Occupation: retired  Tobacco Use  .  Smoking status: Former Smoker    Packs/day: 2.00    Years: 40.00    Pack years: 80.00    Types: Cigarettes    Last attempt to quit: 07/17/1978    Years since quitting: 39.2  . Smokeless tobacco: Never Used  Substance and Sexual Activity  . Alcohol use: No  . Drug use: No  . Sexual activity: Not on file  Other Topics Concern  . Not on file  Social History Narrative   Lost wife 06-2013   Lives in his house, 1 daughter and her 2 adult children live w/ him (1g-child w/ special needs)      Allergies as of 09/26/2017      Reactions   Anticoagulant Compound    History of bleeding on the brain per daughter   Protonix [pantoprazole Sodium] Diarrhea      Medication List        Accurate as of 09/26/17  1:19 PM. Always use your most recent med list.          acetaminophen 325 MG tablet Commonly known as:  TYLENOL Take 2 tablets (650 mg total) by mouth every 6 (six) hours as needed for  mild pain (or Fever >/= 101).   albuterol 108 (90 Base) MCG/ACT inhaler Commonly known as:  PROAIR HFA Inhale 2 puffs into the lungs every 6 (six) hours as needed for wheezing.   AZO CRANBERRY URINARY TRACT 250-60 MG Caps Generic drug:  Cranberry-Vitamin C Take 1 tablet by mouth daily.   benzonatate 200 MG capsule Commonly known as:  TESSALON TAKE ONE CAPSULE 3-4 TIMES DAILY AS NEEDED FOR COUGH   budesonide-formoterol 160-4.5 MCG/ACT inhaler Commonly known as:  SYMBICORT Inhale 2 puffs into the lungs 2 (two) times daily.   buPROPion 200 MG 12 hr tablet Commonly known as:  WELLBUTRIN SR Take 200 mg by mouth 2 (two) times daily.   calcium carbonate 600 MG Tabs tablet Commonly known as:  OS-CAL Take 600 mg by mouth daily.   diazepam 5 MG tablet Commonly known as:  VALIUM Take 1 tablet (5 mg total) by mouth every 6 (six) hours as needed for anxiety.   doxazosin 4 MG tablet Commonly known as:  CARDURA Take 1 tablet (4 mg total) by mouth at bedtime.   fluticasone 50 MCG/ACT nasal spray Commonly known as:  FLONASE Place 2 sprays into both nostrils 2 (two) times daily.   HYDROcodone-acetaminophen 5-325 MG tablet Commonly known as:  NORCO/VICODIN Take 1 tablet every 12 hours as needed for pain.   meclizine 25 MG tablet Commonly known as:  ANTIVERT Take 1 tablet (25 mg total) by mouth daily as needed (vertigo).   megestrol 400 MG/10ML suspension Commonly known as:  MEGACE Take 10 mLs (400 mg total) by mouth 2 (two) times daily.   montelukast 10 MG tablet Commonly known as:  SINGULAIR Take 1 tablet (10 mg total) by mouth daily.   multivitamin with minerals Tabs tablet Take 1 tablet by mouth daily.   PROBIOTIC PO Take 1 capsule by mouth daily.   ranitidine 300 MG tablet Commonly known as:  ZANTAC Take 1 tablet (300 mg total) by mouth at bedtime.   rOPINIRole 1 MG tablet Commonly known as:  REQUIP Take 1 tablet (1 mg total) by mouth 2 (two) times daily.    saccharomyces boulardii 250 MG capsule Commonly known as:  FLORASTOR Take 1 capsule (250 mg total) by mouth 2 (two) times daily.   simvastatin 40 MG tablet Commonly known as:  ZOCOR Take 1 tablet (40 mg total) by mouth daily.   tamsulosin 0.4 MG Caps capsule Commonly known as:  FLOMAX Take 1 capsule (0.4 mg total) by mouth daily.   triamterene-hydrochlorothiazide 37.5-25 MG tablet Commonly known as:  MAXZIDE-25 Take 1/2 tablet by mouth daily          Objective:   Physical Exam BP 126/68 (BP Location: Left Arm, Patient Position: Sitting, Cuff Size: Small)   Pulse 63   Temp 98 F (36.7 C) (Oral)   Resp 16   Ht 5\' 10"  (1.778 m)   Wt 151 lb 2 oz (68.5 kg)   SpO2 93%   BMI 21.68 kg/m   General:   Well developed, well nourished . NAD.  HEENT:  Normocephalic . Face symmetric, atraumatic Lungs:  Decreased breath sounds but clear Normal respiratory effort, no intercostal retractions, no accessory muscle use. Heart: RRR,  no murmur.  +/+++ pretibial edema bilaterally MSK: Right toes without swelling Right hand and wrist: No synovitis, median finger may be slightly puffy but no red, warm or tender. DJD changes throughout Skin: Not pale. Not jaundice Neurologic:  alert & oriented X3.  Speech normal, gait appropriate assisted by a cane. Psych--  Cognition and judgment appear intact.  Cooperative with normal attention span and concentration.  Behavior appropriate. No anxious or depressed appearing.      Assessment & Plan:    Assessment   COPD- Dr Melvyn Novas 08-2015, stable, RTC PRN CKD -----Creatinine 1.67, GFR 32 Hyperlipidemia Depression Dyspepsia: Chronic, declined a GI referral in 2017 after sxs decreased with PPIs empirically NEURO: --Decreased memory --Anisocoria- L pupil larger ,s/p surgery --Neuropathy --H/o Mnire's  -- on diuretics , valium, meclizine Osteoporosis -- h/o pelvic Fx, dexa April 2016: T score (-) 2.3;  rx fosamax 03-2015, dc after 3 months  d/t dysphagia; prolia #1 05-03-16 DJD  -- hydrocodone rarely RLS OSA dx remotely, used a CPAP, wt loss and cpap d/c ~ 2012 (Dr Maxwell Caul) GU: --H/o incontinence, saw Dr Karsten Ro, OV ~ 2013 --Urinary retention 02-2016: sepsis, admitted, had a foley temporarily  H/o melanoma H/o subdural hematoma, epidural abscess, staph aureus  sepsis H/o Prosthetic joint infection H/o Blood clots H/o gastric ulcer C diff dx 10-19-15, s/p flagyl x 2 , diarrhea again 03-2016, better after vancomycin. Coordinate care with his daughter [name]  [phone]  PLAN:  Vertebral fracture: Currently doing well, has not needed hydrocodone, will leave it at his medication list for now. Denies any other falls, uses either a cane or a walker. Weight loss, decreased appetite: Was rx megestrol, never took d/t cost, appetite is now normal, has gained several pounds. Dyspepsia, belching: Started ranitidine, sx  definitely decreased. COPD: On Symbicort, very rarely needs a rescue inhaler. Hand, toe swelling?  Exam is benign, question of puffiness of the right middle finger, rx observation Slightly elevated TSH: Check labs Urinary retention, went to the ER, discharged home with a Foley, subsequently went to urology, Foley removed, no further problems. CKD: Last potassium in the ER slightly low, will recheck today. RTC 4-6 months

## 2017-09-27 NOTE — Assessment & Plan Note (Signed)
Vertebral fracture: Currently doing well, has not needed hydrocodone, will leave it at his medication list for now. Denies any other falls, uses either a cane or a walker. Weight loss, decreased appetite: Was rx megestrol, never took d/t cost, appetite is now normal, has gained several pounds. Dyspepsia, belching: Started ranitidine, sx  definitely decreased. COPD: On Symbicort, very rarely needs a rescue inhaler. Hand, toe swelling?  Exam is benign, question of puffiness of the right middle finger, rx observation Slightly elevated TSH: Check labs Urinary retention, went to the ER, discharged home with a Foley, subsequently went to urology, Foley removed, no further problems. CKD: Last potassium in the ER slightly low, will recheck today. RTC 4-6 months

## 2017-09-28 LAB — T4, FREE: Free T4: 0.79 ng/dL (ref 0.60–1.60)

## 2017-09-28 LAB — TSH: TSH: 5.07 u[IU]/mL — ABNORMAL HIGH (ref 0.35–4.50)

## 2017-09-28 LAB — T3, FREE: T3 FREE: 2 pg/mL — AB (ref 2.3–4.2)

## 2017-12-01 ENCOUNTER — Other Ambulatory Visit: Payer: Self-pay | Admitting: Internal Medicine

## 2017-12-03 ENCOUNTER — Ambulatory Visit: Payer: Medicare Other | Admitting: Internal Medicine

## 2017-12-12 ENCOUNTER — Other Ambulatory Visit: Payer: Self-pay | Admitting: Internal Medicine

## 2017-12-17 ENCOUNTER — Telehealth: Payer: Self-pay | Admitting: Internal Medicine

## 2017-12-17 NOTE — Telephone Encounter (Signed)
Prolia benefits received PA not required 20% Prolia $25 admin fee  Patient may owe approximately $44 OOP  Patient due after 12/10/17  Letter mailed to inform patient of benefits and to schedule

## 2017-12-31 NOTE — Telephone Encounter (Signed)
Nurse visit appt 01/09/2018- Micheal Frank can you order Prolia please?

## 2017-12-31 NOTE — Telephone Encounter (Signed)
Medication has been ordered.

## 2018-01-07 ENCOUNTER — Other Ambulatory Visit: Payer: Self-pay | Admitting: Internal Medicine

## 2018-01-07 NOTE — Telephone Encounter (Signed)
Medication received for pt. 

## 2018-01-09 ENCOUNTER — Ambulatory Visit (INDEPENDENT_AMBULATORY_CARE_PROVIDER_SITE_OTHER): Payer: Medicare Other

## 2018-01-09 DIAGNOSIS — M810 Age-related osteoporosis without current pathological fracture: Secondary | ICD-10-CM

## 2018-01-09 MED ORDER — DENOSUMAB 60 MG/ML ~~LOC~~ SOSY
60.0000 mg | PREFILLED_SYRINGE | Freq: Once | SUBCUTANEOUS | Status: AC
  Administered 2018-01-09: 60 mg via SUBCUTANEOUS

## 2018-01-15 ENCOUNTER — Emergency Department (HOSPITAL_BASED_OUTPATIENT_CLINIC_OR_DEPARTMENT_OTHER)
Admission: EM | Admit: 2018-01-15 | Discharge: 2018-01-15 | Disposition: A | Discharge status: Home / Self Care | Payer: Medicare Other | Source: Home / Self Care | Attending: Emergency Medicine | Admitting: Emergency Medicine

## 2018-01-15 ENCOUNTER — Encounter (HOSPITAL_BASED_OUTPATIENT_CLINIC_OR_DEPARTMENT_OTHER): Payer: Self-pay | Admitting: Emergency Medicine

## 2018-01-15 ENCOUNTER — Other Ambulatory Visit: Payer: Self-pay

## 2018-01-15 ENCOUNTER — Emergency Department (HOSPITAL_BASED_OUTPATIENT_CLINIC_OR_DEPARTMENT_OTHER): Payer: Medicare Other

## 2018-01-15 DIAGNOSIS — Z96652 Presence of left artificial knee joint: Secondary | ICD-10-CM | POA: Diagnosis present

## 2018-01-15 DIAGNOSIS — Z79899 Other long term (current) drug therapy: Secondary | ICD-10-CM

## 2018-01-15 DIAGNOSIS — K5909 Other constipation: Secondary | ICD-10-CM | POA: Diagnosis present

## 2018-01-15 DIAGNOSIS — S2242XA Multiple fractures of ribs, left side, initial encounter for closed fracture: Secondary | ICD-10-CM | POA: Diagnosis not present

## 2018-01-15 DIAGNOSIS — W01198A Fall on same level from slipping, tripping and stumbling with subsequent striking against other object, initial encounter: Secondary | ICD-10-CM

## 2018-01-15 DIAGNOSIS — N183 Chronic kidney disease, stage 3 (moderate): Secondary | ICD-10-CM | POA: Insufficient documentation

## 2018-01-15 DIAGNOSIS — Z7951 Long term (current) use of inhaled steroids: Secondary | ICD-10-CM

## 2018-01-15 DIAGNOSIS — Y9389 Activity, other specified: Secondary | ICD-10-CM | POA: Insufficient documentation

## 2018-01-15 DIAGNOSIS — E785 Hyperlipidemia, unspecified: Secondary | ICD-10-CM | POA: Diagnosis present

## 2018-01-15 DIAGNOSIS — T402X5A Adverse effect of other opioids, initial encounter: Secondary | ICD-10-CM | POA: Diagnosis not present

## 2018-01-15 DIAGNOSIS — J449 Chronic obstructive pulmonary disease, unspecified: Secondary | ICD-10-CM | POA: Insufficient documentation

## 2018-01-15 DIAGNOSIS — D72829 Elevated white blood cell count, unspecified: Secondary | ICD-10-CM | POA: Diagnosis present

## 2018-01-15 DIAGNOSIS — G4733 Obstructive sleep apnea (adult) (pediatric): Secondary | ICD-10-CM | POA: Diagnosis present

## 2018-01-15 DIAGNOSIS — Y998 Other external cause status: Secondary | ICD-10-CM

## 2018-01-15 DIAGNOSIS — W1830XA Fall on same level, unspecified, initial encounter: Secondary | ICD-10-CM | POA: Diagnosis present

## 2018-01-15 DIAGNOSIS — Y929 Unspecified place or not applicable: Secondary | ICD-10-CM | POA: Insufficient documentation

## 2018-01-15 DIAGNOSIS — Z87891 Personal history of nicotine dependence: Secondary | ICD-10-CM

## 2018-01-15 DIAGNOSIS — Z86718 Personal history of other venous thrombosis and embolism: Secondary | ICD-10-CM

## 2018-01-15 DIAGNOSIS — G629 Polyneuropathy, unspecified: Secondary | ICD-10-CM | POA: Diagnosis present

## 2018-01-15 DIAGNOSIS — I129 Hypertensive chronic kidney disease with stage 1 through stage 4 chronic kidney disease, or unspecified chronic kidney disease: Secondary | ICD-10-CM | POA: Diagnosis present

## 2018-01-15 DIAGNOSIS — Z85820 Personal history of malignant melanoma of skin: Secondary | ICD-10-CM

## 2018-01-15 DIAGNOSIS — F329 Major depressive disorder, single episode, unspecified: Secondary | ICD-10-CM | POA: Diagnosis present

## 2018-01-15 DIAGNOSIS — E860 Dehydration: Secondary | ICD-10-CM | POA: Diagnosis present

## 2018-01-15 DIAGNOSIS — N400 Enlarged prostate without lower urinary tract symptoms: Secondary | ICD-10-CM | POA: Diagnosis present

## 2018-01-15 DIAGNOSIS — Z888 Allergy status to other drugs, medicaments and biological substances status: Secondary | ICD-10-CM

## 2018-01-15 DIAGNOSIS — R410 Disorientation, unspecified: Secondary | ICD-10-CM | POA: Diagnosis not present

## 2018-01-15 DIAGNOSIS — F0390 Unspecified dementia without behavioral disturbance: Secondary | ICD-10-CM | POA: Diagnosis present

## 2018-01-15 DIAGNOSIS — R946 Abnormal results of thyroid function studies: Secondary | ICD-10-CM | POA: Diagnosis present

## 2018-01-15 MED ORDER — MORPHINE SULFATE 15 MG PO TABS: 15.0000 mg | ORAL_TABLET | ORAL | 0 refills | Status: DC | PRN

## 2018-01-15 MED ORDER — OXYCODONE HCL 5 MG PO TABS
2.5000 mg | ORAL_TABLET | Freq: Once | ORAL | Status: AC
  Administered 2018-01-15: 2.5 mg via ORAL
  Filled 2018-01-15: qty 1

## 2018-01-15 MED ORDER — ACETAMINOPHEN 500 MG PO TABS
1000.0000 mg | ORAL_TABLET | Freq: Once | ORAL | Status: AC
  Administered 2018-01-15: 1000 mg via ORAL
  Filled 2018-01-15: qty 2

## 2018-01-15 NOTE — ED Notes (Signed)
Patient transported to X-ray 

## 2018-01-15 NOTE — Discharge Instructions (Signed)
Take tylenol 1000mg(2 extra strength) four times a day.  ° °Then take the pain medicine if you feel like you need it. Narcotics do not help with the pain, they only make you care about it less.  You can become addicted to this, people may break into your house to steal it.  It will constipate you.  If you drive under the influence of this medicine you can get a DUI.   ° °

## 2018-01-15 NOTE — ED Notes (Signed)
Pt at xray at the moment

## 2018-01-15 NOTE — ED Provider Notes (Signed)
[facility] EMERGENCY DEPARTMENT Provider Note   CSN: [id number] Arrival date & time: 01/15/18  1743     History   Chief Complaint Chief Complaint  Patient presents with  . Fall    HPI Micheal Frank is a 84 y.o. male.  84 yo M with a chief complaint of a fall from standing.  The patient was holding a door open for someone in a wheelchair in the door which has a spring mechanism caused him to lose his balance and he fell onto his left side.  Thinks he fell against a counter.  Struck his head, denies loss of consciousness denies headache or neck pain.  Complaining mostly of pain to the left posterior ribs.  Worse with coughing deep breathing.  Also has some pain to the left lateral aspect of the knee.  Has a knee replacement there that he is worried about.  Had some pain with ambulation but was able to walk with some assistance.  Denies midline spinal tenderness denies shortness of breath denies abdominal pain.  The history is provided by the patient.  Injury  This is a new problem. The current episode started 1 to 2 hours ago. The problem occurs constantly. The problem has not changed since onset.Associated symptoms include chest pain. Pertinent negatives include no abdominal pain, no headaches and no shortness of breath. The symptoms are aggravated by twisting, bending and coughing. Nothing relieves the symptoms. He has tried nothing for the symptoms. The treatment provided no relief.    Past Medical History:  Diagnosis Date  . Arthritis    left leg also has swelling  . Asthma   . Blood dyscrasia    pt CAN NOT have any blood thinners d/t hx brain bleed  . Chronic kidney disease, stage III (moderate) ([place]) 01/20/2013  . Complication of anesthesia    confusion with anesthesia  . Constipation    miralax prn  . COPD (chronic obstructive pulmonary disease) ([facility])    emphysema  . Depression    takes wellbutrin bid  . Epidural abscess   . Gastric ulcer   . History of  blood clots   . Hyperlipidemia   . Melanoma ([place])   . Peripheral neuropathy   . Pneumonia    as a child and in 1952;had a pneumonia vaccine couple of years ago  . Prosthetic joint infection ([place])   . S/P right heart catheterization    at least 17yrs  . Shingles   . Short-term memory loss   . Shortness of breath    with exertion  . Sleep apnea    has CPAP but doesn't use it;sleep study done at least 61yrs ago  . Staphylococcus aureus bacteremia with sepsis ([place])   . Subdural hematoma ([place]) 03/30/11  . Urinary frequency    wears depends    Patient Active Problem List   Diagnosis Date Noted  . CAP (community acquired pneumonia) 06/25/2017  . UTI (urinary tract infection) 06/24/2017  . Sepsis ([place]) 06/24/2017  . Compression fracture of T12 vertebra ([place]) 06/24/2017  . Generalized weakness 06/24/2017  . Presence of left artificial knee joint 07/24/2016  . Chronic pain of left knee 07/24/2016  . Chronic midline low back pain without sciatica 07/24/2016  . Depression 07/09/2016  . C. difficile diarrhea 05/04/2016  . Chronic cough 08/16/2015  . PCP NOTES >>>>>> 04/06/2015  . CTS (carpal tunnel syndrome) 11/16/2014  . Hyperlipidemia 08/17/2014  . Superficial bruising of chest wall 07/29/2014  . Urgency incontinence  01/23/2013  . Chronic kidney disease, stage III (moderate) ([place]) 01/20/2013  . At high risk for falls 11/28/2012  . ALLERGIC RHINITIS 12/09/2009  . Carcinoma in situ of skin 04/30/2008  . RESTLESS LEG SYNDROME 04/30/2008  . NEUROPATHY 04/30/2008  . GLAUCOMA, BORDERLINE 04/30/2008  . Meniere's disease 04/30/2008  . HEARING LOSS 04/30/2008  . COPD GOLD II  04/30/2008  . DJD (degenerative joint disease) 04/30/2008  . Osteoporosis 04/30/2008  . Sleep apnea 04/30/2008  . HISTORY OF ASBESTOS EXPOSURE 04/30/2008    Past Surgical History:  Procedure Laterality Date  . epidural abscess drainage  12/2010  . EXTERNAL EAR SURGERY     shunt placed in right ear d/t  mennie[place]   . EYE SURGERY     bil cataract surgery  . FILTERING PROCEDURE     filter placed in neck d/t blood  clot  . HERNIA REPAIR     25+yrs ago  . KNEE ARTHROSCOPY    . SKIN BIOPSY     melanoma on head   . TOTAL KNEE ARTHROPLASTY     x 2 left knee 2003/2012  . TOTAL KNEE REVISION  05/23/2011   Procedure: TOTAL KNEE REVISION;  Surgeon: Meredith Pel;  Location: [facility];  Service: Orthopedics;  Laterality: Left;  LEFT KNEE REMOVAL OF SPACER, POSSIBLE REIMPLANTATION OF REVISION TKA VERSES REPLACEMENT ANTIBIOTIC SPACER        Home Medications    Prior to Admission medications   Medication Sig Start Date End Date Taking? Authorizing Provider  acetaminophen (TYLENOL) 325 MG tablet Take 2 tablets (650 mg total) by mouth every 6 (six) hours as needed for mild pain (or Fever >/= 101). 02/22/16   Regalado, Belkys A, MD  albuterol (PROAIR HFA) 108 (90 BASE) MCG/ACT inhaler Inhale 2 puffs into the lungs every 6 (six) hours as needed for wheezing. 07/31/14   Elsie Stain, MD  benzonatate (TESSALON) 200 MG capsule TAKE ONE CAPSULE 3-4 TIMES DAILY AS NEEDED FOR COUGH Patient taking differently: Take 200 mg by mouth 2 (two) times daily as needed for cough. TAKE ONE CAPSULE 3-4 TIMES DAILY AS NEEDED FOR COUGH 08/16/15   Tanda Rockers, MD  budesonide-formoterol [facility]) 160-4.5 MCG/ACT inhaler Inhale 2 puffs into the lungs 2 (two) times daily. 07/05/17   Colon Branch, MD  buPROPion (WELLBUTRIN SR) 200 MG 12 hr tablet Take 1 tablet (200 mg total) by mouth 2 (two) times daily. 12/03/17   Colon Branch, MD  calcium carbonate (OS-CAL) 600 MG TABS Take 600 mg by mouth daily.     [provider]  Cranberry-Vitamin C (AZO CRANBERRY URINARY TRACT) 250-60 MG CAPS Take 1 tablet by mouth daily.    [provider]  diazepam (VALIUM) 5 MG tablet Take 1 tablet (5 mg total) by mouth every 6 (six) hours as needed for anxiety. 03/01/16   Colon Branch, MD  doxazosin (CARDURA) 4 MG tablet Take 1  tablet (4 mg total) by mouth at bedtime. 12/03/17   Colon Branch, MD  fluticasone [facility]) 50 MCG/ACT nasal spray Place 2 sprays into both nostrils 2 (two) times daily. 07/05/17   Colon Branch, MD  HYDROcodone-acetaminophen (NORCO/VICODIN) 5-325 MG tablet Take 1 tablet every 12 hours as needed for pain. Patient taking differently: Take 1 tablet by mouth every 12 (twelve) hours as needed for moderate pain or severe pain.  06/05/17   Colon Branch, MD  meclizine (ANTIVERT) 25 MG tablet Take 1 tablet (25 mg total)  by mouth daily as needed (vertigo). 03/01/16   Colon Branch, MD  montelukast (SINGULAIR) 10 MG tablet Take 1 tablet (10 mg total) by mouth daily. 01/07/18   Colon Branch, MD  morphine (MSIR) 15 MG tablet Take 1 tablet (15 mg total) by mouth every 4 (four) hours as needed for severe pain. 01/15/18   Deno Etienne, DO  Multiple Vitamin (MULTIVITAMIN WITH MINERALS) TABS Take 1 tablet by mouth daily.    [provider]  Probiotic Product (PROBIOTIC PO) Take 1 capsule by mouth daily.    [provider]  ranitidine (ZANTAC) 300 MG tablet Take 1 tablet (300 mg total) by mouth at bedtime. 07/05/17   Colon Branch, MD  rOPINIRole (REQUIP) 1 MG tablet Take 1 tablet (1 mg total) by mouth 2 (two) times daily. 09/17/17   Colon Branch, MD  saccharomyces boulardii (FLORASTOR) 250 MG capsule Take 1 capsule (250 mg total) by mouth 2 (two) times daily. 03/09/16   Colon Branch, MD  simvastatin (ZOCOR) 40 MG tablet Take 1 tablet (40 mg total) by mouth daily. 12/12/17   Colon Branch, MD  tamsulosin (FLOMAX) 0.4 MG CAPS capsule Take 1 capsule (0.4 mg total) by mouth daily. 12/03/17   Colon Branch, MD  triamterene-hydrochlorothiazide [facility]) 37.5-25 MG tablet Take 1/2 tablet by mouth daily 07/13/17   Colon Branch, MD    Family History Family History  Problem Relation Age of Onset  . Melanoma Brother   . CAD Neg Hx   . Diabetes Neg Hx     Social History Social History   Tobacco Use  . Smoking status:  Former Smoker    Packs/day: 2.00    Years: 40.00    Pack years: 80.00    Types: Cigarettes    Last attempt to quit: 07/17/1978    Years since quitting: 39.5  . Smokeless tobacco: Never Used  Substance Use Topics  . Alcohol use: No  . Drug use: No     Allergies   Anticoagulant compound and Protonix [pantoprazole sodium]   Review of Systems Review of Systems  Constitutional: Negative for chills and fever.  HENT: Negative for congestion and facial swelling.   Eyes: Negative for discharge and visual disturbance.  Respiratory: Negative for shortness of breath.   Cardiovascular: Positive for chest pain. Negative for palpitations.  Gastrointestinal: Negative for abdominal pain, diarrhea and vomiting.  Musculoskeletal: Positive for arthralgias and myalgias.  Skin: Negative for color change and rash.  Neurological: Negative for tremors, syncope and headaches.  Psychiatric/Behavioral: Negative for confusion and dysphoric mood.     Physical Exam Updated Vital Signs BP (!) 134/96 (BP Location: Right Arm)   Pulse 70   Temp 98.2 F (36.8 C) (Oral)   Resp 20   Wt 67.1 kg (148 lb)   SpO2 100%   BMI 21.24 kg/m   Physical Exam  Constitutional: He is oriented to person, place, and time. He appears well-developed and well-nourished.  HENT:  Head: Normocephalic and atraumatic.  Eyes: Pupils are equal, round, and reactive to light. EOM are normal.  Neck: Normal range of motion. Neck supple. No JVD present.  Cardiovascular: Normal rate and regular rhythm. Exam reveals no gallop and no friction rub.  No murmur heard. Pulmonary/Chest: No respiratory distress. He has no wheezes.  Abdominal: He exhibits no distension and no mass. There is no tenderness. There is no rebound and no guarding.  Musculoskeletal: Normal range of motion. He exhibits tenderness.  Bruising  to the left tibial tuberosity.  Full range of motion of the left knee.  No bony crepitus.  Pulse motor and sensation is intact  distally.  Pain to the posterior rib angles about ribs 8 through 10.  Skin tear to the left elbow.  No noted bony tenderness to the left upper extremity.  Palpated from head to toe without any other noted areas of bony tenderness.  Neurological: He is alert and oriented to person, place, and time.  Skin: No rash noted. No pallor.  Psychiatric: He has a normal mood and affect. His behavior is normal.  Nursing note and vitals reviewed.    ED Treatments / Results  Labs (all labs ordered are listed, but only abnormal results are displayed) Labs Reviewed - No data to display  EKG None  Radiology Dg Ribs Unilateral W/chest Left  Result Date: 01/15/2018 CLINICAL DATA:  LEFT chest and rib pain following fall. Initial encounter. EXAM: LEFT RIBS AND CHEST - 3+ VIEW COMPARISON:  None. FINDINGS: Acute fractures of the LEFT 6th, 7th and 8th ribs noted. No pleural effusion or pneumothorax. Cardiomediastinal silhouette is unchanged. No airspace disease or pleural effusion. No other acute abnormalities identified. IMPRESSION: Acute LEFT 6th, 7th and 8th rib fractures. No pleural effusion or pneumothorax. Electronically Signed   By: Margarette Canada M.D.   On: 01/15/2018 19:17   Ct Head Wo Contrast  Result Date: 01/15/2018 CLINICAL DATA:  84 year old male post fall. Denies loss of consciousness. Does not think he hit his head. Initial encounter. EXAM: CT HEAD WITHOUT CONTRAST TECHNIQUE: Contiguous axial images were obtained from the base of the skull through the vertex without intravenous contrast. COMPARISON:  01/16/2013 CT. FINDINGS: Brain: Remote small right frontal subdural collection slightly smaller than on prior exam now with maximal thickness of 3 mm versus prior 4 mm. Slightly asymmetric prominent extra-axial spaces larger on the right supratentorially and symmetric infratentorially may reflect result of atrophy versus subdural hygromas. Overall, no acute intracranial hemorrhage or CT evidence of large acute  infarct. Remote basal ganglia/corona radiata infarcts and chronic microvascular changes. Moderate to marked global atrophy. No intracranial mass lesion noted on this unenhanced exam. Vascular: Vascular calcifications Skull: No skull fracture. Sinuses/Orbits: No acute orbital abnormality. Visualized paranasal sinuses are clear. Other: Prior right mastoidectomy. Temporomandibular joint degenerative changes. IMPRESSION: No skull fracture or acute intracranial hemorrhage. Chronic changes as detailed above. Electronically Signed   By: Genia Del M.D.   On: 01/15/2018 19:06   Dg Knee Complete 4 Views Left  Result Date: 01/15/2018 CLINICAL DATA:  Knee pain after fall EXAM: LEFT KNEE - COMPLETE 4+ VIEW COMPARISON:  06/26/2017 FINDINGS: Status post left knee replacement with normal alignment. Suspected interval revision of the prosthesis. No definite acute displaced fracture is seen. Similar appearance of cortical bone thickening at the distal femur cortical bone thickening at the distal femur is not significantly changed. Suspected knee effusion. Dense vascular calcifications. IMPRESSION: Status post left knee replacement without definite acute osseous abnormality. Suspected knee effusion. Electronically Signed   By: Donavan Foil M.D.   On: 01/15/2018 19:21    Procedures Procedures (including critical care time)  Medications Ordered in ED Medications  acetaminophen (TYLENOL) tablet 1,000 mg (1,000 mg Oral Given 01/15/18 1825)  oxyCODONE (Oxy IR/ROXICODONE) immediate release tablet 2.5 mg (2.5 mg Oral Given 01/15/18 1825)     Initial Impression / Assessment and Plan / ED Course  I have reviewed the triage vital signs and the nursing notes.  Pertinent labs &  imaging results that were available during my care of the patient were reviewed by me and considered in my medical decision making (see chart for details).     84 yo M with a chief complaint of a fall from standing.  The patient struck his head on  the way down.  Complaining mostly of left rib pain.  Will obtain a rib series CT of the head plain film of the left knee.  CT of the head without intracranial bleeding is viewed by me.  Plain film the left knee without acute fracture is viewed by me.  Patient's rib series with 3 broken ribs no pneumothorax.  Patient continues to be able to breathe without difficulty.  His pain is somewhat improved after pain meds here.  I discussed inpatient versus outpatient therapy and is currently electing to be treated as an outpatient.  We will do incentive spirometry at home.  Pain meds.  Pillow splinting.  PCP follow-up.  7:45 PM:  I have discussed the diagnosis/risks/treatment options with the patient and family and believe the pt to be eligible for discharge home to follow-up with PCP. We also discussed returning to the ED immediately if new or worsening sx occur. We discussed the sx which are most concerning (e.g., sudden worsening pain, fever, sob ) that necessitate immediate return. Medications administered to the patient during their visit and any new prescriptions provided to the patient are listed below.  Medications given during this visit Medications  acetaminophen (TYLENOL) tablet 1,000 mg (1,000 mg Oral Given 01/15/18 1825)  oxyCODONE (Oxy IR/ROXICODONE) immediate release tablet 2.5 mg (2.5 mg Oral Given 01/15/18 1825)     The patient appears reasonably screen and/or stabilized for discharge and I doubt any other medical condition or other [facility] requiring further screening, evaluation, or treatment in the ED at this time prior to discharge.    Final Clinical Impressions(s) / ED Diagnoses   Final diagnoses:  Closed fracture of multiple ribs of left side, initial encounter    ED Discharge Orders        Ordered    morphine (MSIR) 15 MG tablet  Every 4 hours PRN     01/15/18 1938       Deno Etienne, DO 01/15/18 1945

## 2018-01-15 NOTE — ED Triage Notes (Signed)
Pt arrives via EMS from home with family. Pt c/o trip and fall on front porch. Pt landed on L side. C/o skin tear to L forearm and L rib pain. Denies LOC.

## 2018-01-16 ENCOUNTER — Telehealth: Payer: Self-pay | Admitting: Internal Medicine

## 2018-01-16 ENCOUNTER — Other Ambulatory Visit: Payer: Self-pay

## 2018-01-16 ENCOUNTER — Inpatient Hospital Stay (HOSPITAL_COMMUNITY)
Admission: EM | Admit: 2018-01-16 | Discharge: 2018-01-22 | DRG: 185 | Disposition: A | Discharge status: Home Health | Payer: Medicare Other | Attending: Internal Medicine | Admitting: Internal Medicine

## 2018-01-16 ENCOUNTER — Emergency Department (HOSPITAL_COMMUNITY): Payer: Medicare Other

## 2018-01-16 ENCOUNTER — Encounter (HOSPITAL_COMMUNITY): Payer: Self-pay | Admitting: *Deleted

## 2018-01-16 DIAGNOSIS — S2242XA Multiple fractures of ribs, left side, initial encounter for closed fracture: Secondary | ICD-10-CM | POA: Diagnosis not present

## 2018-01-16 DIAGNOSIS — N400 Enlarged prostate without lower urinary tract symptoms: Secondary | ICD-10-CM

## 2018-01-16 DIAGNOSIS — I10 Essential (primary) hypertension: Secondary | ICD-10-CM

## 2018-01-16 DIAGNOSIS — W19XXXA Unspecified fall, initial encounter: Secondary | ICD-10-CM | POA: Diagnosis not present

## 2018-01-16 DIAGNOSIS — S2249XA Multiple fractures of ribs, unspecified side, initial encounter for closed fracture: Secondary | ICD-10-CM | POA: Diagnosis present

## 2018-01-16 LAB — CBC WITH DIFFERENTIAL/PLATELET
Abs Immature Granulocytes: 0.1 10*3/uL (ref 0.0–0.1)
Basophils Absolute: 0 10*3/uL (ref 0.0–0.1)
Basophils Relative: 0 %
EOS ABS: 0.1 10*3/uL (ref 0.0–0.7)
Eosinophils Relative: 1 %
HEMATOCRIT: 48.3 % (ref 39.0–52.0)
Hemoglobin: 15.2 g/dL (ref 13.0–17.0)
IMMATURE GRANULOCYTES: 1 %
LYMPHS ABS: 1.1 10*3/uL (ref 0.7–4.0)
Lymphocytes Relative: 8 %
MCH: 30.7 pg (ref 26.0–34.0)
MCHC: 31.5 g/dL (ref 30.0–36.0)
MCV: 97.6 fL (ref 78.0–100.0)
MONOS PCT: 13 %
Monocytes Absolute: 1.7 10*3/uL — ABNORMAL HIGH (ref 0.1–1.0)
NEUTROS PCT: 77 %
Neutro Abs: 9.6 10*3/uL — ABNORMAL HIGH (ref 1.7–7.7)
Platelets: 251 10*3/uL (ref 150–400)
RBC: 4.95 MIL/uL (ref 4.22–5.81)
RDW: 13.6 % (ref 11.5–15.5)
WBC: 12.5 10*3/uL — ABNORMAL HIGH (ref 4.0–10.5)

## 2018-01-16 LAB — BASIC METABOLIC PANEL
Anion gap: 13 (ref 5–15)
BUN: 15 mg/dL (ref 8–23)
CHLORIDE: 104 mmol/L (ref 98–111)
CO2: 24 mmol/L (ref 22–32)
CREATININE: 1.46 mg/dL — AB (ref 0.61–1.24)
Calcium: 8.7 mg/dL — ABNORMAL LOW (ref 8.9–10.3)
GFR calc Af Amer: 47 mL/min — ABNORMAL LOW (ref >60)
GFR calc non Af Amer: 41 mL/min — ABNORMAL LOW (ref >60)
GLUCOSE: 75 mg/dL (ref 70–99)
Potassium: 3.9 mmol/L (ref 3.5–5.1)
Sodium: 141 mmol/L (ref 135–145)

## 2018-01-16 MED ORDER — SENNA 8.6 MG PO TABS
2.0000 | ORAL_TABLET | Freq: Every day | ORAL | Status: DC
  Administered 2018-01-17: 17.2 mg via ORAL
  Filled 2018-01-16: qty 2

## 2018-01-16 MED ORDER — HYDROCODONE-ACETAMINOPHEN 5-325 MG PO TABS
1.0000 | ORAL_TABLET | Freq: Two times a day (BID) | ORAL | Status: DC | PRN
  Filled 2018-01-16 (×2): qty 1

## 2018-01-16 MED ORDER — MOMETASONE FURO-FORMOTEROL FUM 200-5 MCG/ACT IN AERO
2.0000 | INHALATION_SPRAY | Freq: Two times a day (BID) | RESPIRATORY_TRACT | Status: DC
  Administered 2018-01-16 – 2018-01-22 (×12): 2 via RESPIRATORY_TRACT
  Filled 2018-01-16: qty 8.8

## 2018-01-16 MED ORDER — DOXAZOSIN MESYLATE 4 MG PO TABS
4.0000 mg | ORAL_TABLET | Freq: Every day | ORAL | Status: DC
  Administered 2018-01-17 – 2018-01-21 (×6): 4 mg via ORAL
  Filled 2018-01-16 (×6): qty 1

## 2018-01-16 MED ORDER — ALBUTEROL SULFATE (2.5 MG/3ML) 0.083% IN NEBU: 2.5000 mg | INHALATION_SOLUTION | Freq: Four times a day (QID) | RESPIRATORY_TRACT | Status: DC | PRN

## 2018-01-16 MED ORDER — BISACODYL 5 MG PO TBEC
5.0000 mg | DELAYED_RELEASE_TABLET | Freq: Every day | ORAL | Status: DC
  Administered 2018-01-17 – 2018-01-22 (×6): 5 mg via ORAL
  Filled 2018-01-16 (×6): qty 1

## 2018-01-16 MED ORDER — HYDROMORPHONE HCL 1 MG/ML IJ SOLN
0.5000 mg | INTRAMUSCULAR | Status: DC | PRN
Start: 2018-01-16 — End: 2018-01-22
  Administered 2018-01-17 (×3): 0.5 mg via INTRAVENOUS
  Filled 2018-01-16 (×3): qty 1

## 2018-01-16 MED ORDER — MONTELUKAST SODIUM 10 MG PO TABS
10.0000 mg | ORAL_TABLET | Freq: Every day | ORAL | Status: DC
  Administered 2018-01-17 – 2018-01-22 (×7): 10 mg via ORAL
  Filled 2018-01-16 (×7): qty 1

## 2018-01-16 MED ORDER — BENZONATATE 100 MG PO CAPS: 200.0000 mg | ORAL_CAPSULE | Freq: Two times a day (BID) | ORAL | Status: DC | PRN

## 2018-01-16 MED ORDER — FLUTICASONE PROPIONATE 50 MCG/ACT NA SUSP
2.0000 | Freq: Two times a day (BID) | NASAL | Status: DC
  Administered 2018-01-17 – 2018-01-22 (×10): 2 via NASAL
  Filled 2018-01-16: qty 16

## 2018-01-16 MED ORDER — CALCIUM CARBONATE 1250 (500 CA) MG PO TABS
1250.0000 mg | ORAL_TABLET | Freq: Every day | ORAL | Status: DC
  Administered 2018-01-18 – 2018-01-21 (×4): 1250 mg via ORAL
  Filled 2018-01-16 (×4): qty 1

## 2018-01-16 MED ORDER — MECLIZINE HCL 25 MG PO TABS
25.0000 mg | ORAL_TABLET | Freq: Every day | ORAL | Status: DC | PRN
  Filled 2018-01-16: qty 1

## 2018-01-16 MED ORDER — ROPINIROLE HCL 1 MG PO TABS
1.0000 mg | ORAL_TABLET | Freq: Two times a day (BID) | ORAL | Status: DC
  Administered 2018-01-17 – 2018-01-22 (×12): 1 mg via ORAL
  Filled 2018-01-16 (×12): qty 1

## 2018-01-16 MED ORDER — BUPROPION HCL ER (SR) 100 MG PO TB12
200.0000 mg | ORAL_TABLET | Freq: Two times a day (BID) | ORAL | Status: DC
  Administered 2018-01-17 – 2018-01-21 (×11): 200 mg via ORAL
  Filled 2018-01-16 (×13): qty 2

## 2018-01-16 MED ORDER — VITAMIN C 500 MG PO TABS
500.0000 mg | ORAL_TABLET | Freq: Every day | ORAL | Status: DC
  Administered 2018-01-17 – 2018-01-22 (×7): 500 mg via ORAL
  Filled 2018-01-16 (×7): qty 1

## 2018-01-16 MED ORDER — SIMVASTATIN 40 MG PO TABS
40.0000 mg | ORAL_TABLET | Freq: Every day | ORAL | Status: DC
  Administered 2018-01-17 – 2018-01-21 (×6): 40 mg via ORAL
  Filled 2018-01-16 (×6): qty 1

## 2018-01-16 MED ORDER — TAMSULOSIN HCL 0.4 MG PO CAPS
0.4000 mg | ORAL_CAPSULE | Freq: Every day | ORAL | Status: DC
  Administered 2018-01-17 – 2018-01-22 (×7): 0.4 mg via ORAL
  Filled 2018-01-16 (×7): qty 1

## 2018-01-16 MED ORDER — FAMOTIDINE 20 MG PO TABS
20.0000 mg | ORAL_TABLET | Freq: Every day | ORAL | Status: DC
  Administered 2018-01-17 – 2018-01-22 (×7): 20 mg via ORAL
  Filled 2018-01-16 (×7): qty 1

## 2018-01-16 MED ORDER — HYDRALAZINE HCL 20 MG/ML IJ SOLN: 10.0000 mg | Freq: Four times a day (QID) | INTRAMUSCULAR | Status: DC | PRN

## 2018-01-16 MED ORDER — ENOXAPARIN SODIUM 30 MG/0.3ML ~~LOC~~ SOLN
30.0000 mg | SUBCUTANEOUS | Status: DC
  Administered 2018-01-17 – 2018-01-21 (×6): 30 mg via SUBCUTANEOUS
  Filled 2018-01-16 (×6): qty 0.3

## 2018-01-16 MED ORDER — DICLOFENAC EPOLAMINE 1.3 % TD PTCH
1.0000 | MEDICATED_PATCH | Freq: Two times a day (BID) | TRANSDERMAL | Status: DC
  Administered 2018-01-16 – 2018-01-22 (×12): 1 via TRANSDERMAL
  Filled 2018-01-16 (×14): qty 1

## 2018-01-16 MED ORDER — SODIUM CHLORIDE 0.45 % IV SOLN
INTRAVENOUS | Status: DC
  Administered 2018-01-17: 02:00:00 via INTRAVENOUS

## 2018-01-16 MED ORDER — TRIAMTERENE-HCTZ 37.5-25 MG PO TABS: 1.0000 | ORAL_TABLET | Freq: Every day | ORAL | Status: DC

## 2018-01-16 MED ORDER — ADULT MULTIVITAMIN W/MINERALS CH
1.0000 | ORAL_TABLET | Freq: Every day | ORAL | Status: DC
Start: 2018-01-16 — End: 2018-01-22
  Administered 2018-01-17 – 2018-01-22 (×7): 1 via ORAL
  Filled 2018-01-16 (×7): qty 1

## 2018-01-16 MED ORDER — HYDROMORPHONE HCL 1 MG/ML IJ SOLN
1.0000 mg | Freq: Once | INTRAMUSCULAR | Status: AC
  Administered 2018-01-16: 1 mg via INTRAVENOUS
  Filled 2018-01-16: qty 1

## 2018-01-16 NOTE — Telephone Encounter (Signed)
Patient went to the ER, had a fall, + rib fractures. Please check on him, schedule a office visit at his convenience if he feels he needs to be seen.

## 2018-01-16 NOTE — ED Triage Notes (Signed)
PT here via [facility] from home.  Was transported to [facility] yesterday and was supposed to be admitted, but pt elected to go home instead.  PT called ems today d/t uncontrolled pain, despite taking morphine at home (7.5 mg orally).  Pt ao x 4.  VS 156/75, hr 97, 95% RA , rr 16.

## 2018-01-16 NOTE — Telephone Encounter (Signed)
thx

## 2018-01-16 NOTE — H&P (Signed)
History and Physical    Micheal Frank ZOX:[id number] DOB: 18-Sep-1925 DOA: 01/16/2018  PCP: Colon Branch, MD Patient coming from: Home  Chief Complaint: Status post fall  HPI: Micheal Frank is a 84 y.o. male with medical history significant of CKD stage III, constipation, depression, COPD not on oxygen, history of epidural abscess, history of subdural hematoma admitted status post mechanical fall yesterday.  Patient was opening the door for his grandson who is wheelchair-bound due to osteogenesis imperfecta.  He slipped and fell.  He was in his usual state of health until then.  He denied any nausea vomiting diarrhea, fever chills, cough, chest pain, shortness of breath, headache changes with his vision urinary complaints.  He went to [facility] ER yesterday and left home without getting admitted thinking he is able to take care of himself at home.  He lives at home with his daughter and daughter's son who is handicapped.  He took a lot of morphine at home which did not help his pain so he decided to come to the emergency room.    ED Course: Received Dilaudid 1 mg, Flector patch.  Sodium 141 potassium 3.9 BUN 15 creatinine 1.46, white count 12.5 hemoglobin 15.2, platelet count 251.  CT head no skull fracture or intracranial hemorrhage.  Rib x-rays acute left sixth seventh and eighth rib fractures no pleural effusion or pneumothorax.  TSH mildly elevated at 5.07, T3 2.024 is 0.79.  Review of Systems: As per HPI otherwise all other systems reviewed and are negative  Ambulatory Status: Patient is ambulatory at baseline and does all his ADLs.  Past Medical History:  Diagnosis Date  . Arthritis    left leg also has swelling  . Asthma   . Blood dyscrasia    pt CAN NOT have any blood thinners d/t hx brain bleed  . Chronic kidney disease, stage III (moderate) ([place]) 01/20/2013  . Complication of anesthesia    confusion with anesthesia  . Constipation    miralax prn  . COPD (chronic  obstructive pulmonary disease) ([facility])    emphysema  . Depression    takes wellbutrin bid  . Epidural abscess   . Gastric ulcer   . History of blood clots   . Hyperlipidemia   . Melanoma ([place])   . Peripheral neuropathy   . Pneumonia    as a child and in 1952;had a pneumonia vaccine couple of years ago  . Prosthetic joint infection ([place])   . S/P right heart catheterization    at least 50yrs  . Shingles   . Short-term memory loss   . Shortness of breath    with exertion  . Sleep apnea    has CPAP but doesn't use it;sleep study done at least 45yrs ago  . Staphylococcus aureus bacteremia with sepsis ([place])   . Subdural hematoma ([place]) 03/30/11  . Urinary frequency    wears depends    Past Surgical History:  Procedure Laterality Date  . epidural abscess drainage  12/2010  . 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 EYE SURGERY     bil cataract surgery  . FILTERING PROCEDURE     filter placed in neck d/t blood  clot  . HERNIA REPAIR     25+yrs ago  . KNEE ARTHROSCOPY    . SKIN BIOPSY     melanoma on head   . TOTAL KNEE ARTHROPLASTY     x 2 left  knee 2003/2012  . TOTAL KNEE REVISION  05/23/2011   Procedure: TOTAL KNEE REVISION;  Surgeon: Meredith Pel;  Location: [place];  Service: Orthopedics;  Laterality: Left;  LEFT KNEE REMOVAL OF SPACER, POSSIBLE REIMPLANTATION OF REVISION TKA VERSES REPLACEMENT ANTIBIOTIC SPACER    Social History   Socioeconomic History  . Marital status: Widowed    Spouse name: Not on file  . Number of children: 3  . Years of education: Not on file  . Highest education level: Not on file  Occupational History  . Occupation: retired  Scientific laboratory technician  . Financial resource strain: Not on file  . Food insecurity:    Worry: Not on file    Inability: Not on file  . Transportation needs:    Medical: Not on file    Non-medical: Not on file  Tobacco Use  . Smoking status: Former Smoker    Packs/day: 2.00    Years: 40.00     Pack years: 80.00    Types: Cigarettes    Last attempt to quit: 07/17/1978    Years since quitting: 39.5  . Smokeless tobacco: Never Used  Substance and Sexual Activity  . Alcohol use: No  . Drug use: No  . Sexual activity: Not on file  Lifestyle  . Physical activity:    Days per week: Not on file    Minutes per session: Not on file  . Stress: Not on file  Relationships  . Social connections:    Talks on phone: Not on file    Gets together: Not on file    Attends religious service: Not on file    Active member of club or organization: Not on file    Attends meetings of clubs or organizations: Not on file    Relationship status: Not on file  . Intimate partner violence:    Fear of current or ex partner: Not on file    Emotionally abused: Not on file    Physically abused: Not on file    Forced sexual activity: Not on file  Other Topics Concern  . Not on file  Social History Narrative   Lost wife 06-2013   Lives in his house, 1 daughter and her 2 adult children live w/ him (1g-child w/ special needs)    Allergies  Allergen Reactions  . Anticoagulant Compound     History of bleeding on the brain per daughter  . Protonix [Pantoprazole Sodium] Diarrhea    Family History  Problem Relation Age of Onset  . Melanoma Brother   . CAD Neg Hx   . Diabetes Neg Hx       Prior to Admission medications   Medication Sig Start Date End Date Taking? Authorizing Provider  acetaminophen (TYLENOL) 325 MG tablet Take 2 tablets (650 mg total) by mouth every 6 (six) hours as needed for mild pain (or Fever >/= 101). 02/22/16  Yes Regalado, Belkys A, MD  albuterol (PROAIR HFA) 108 (90 BASE) MCG/ACT inhaler Inhale 2 puffs into the lungs every 6 (six) hours as needed for wheezing. 07/31/14  Yes Elsie Stain, MD  benzonatate (TESSALON) 200 MG capsule TAKE ONE CAPSULE 3-4 TIMES DAILY AS NEEDED FOR COUGH Patient taking differently: Take 200 mg by mouth 2 (two) times daily as needed for cough. TAKE  ONE CAPSULE 3-4 TIMES DAILY AS NEEDED FOR COUGH 08/16/15  Yes Tanda Rockers, MD  budesonide-formoterol [facility]) 160-4.5 MCG/ACT inhaler Inhale 2 puffs into the lungs 2 (two) times daily. 07/05/17  Yes Colon Branch, MD  buPROPion [facility]) 200 MG 12 hr tablet Take 1 tablet (200 mg total) by mouth 2 (two) times daily. 12/03/17  Yes Paz, Alda Berthold, MD  calcium carbonate (OS-CAL) 600 MG TABS Take 600 mg by mouth daily.    Yes [provider]  Cranberry-Vitamin C (AZO CRANBERRY URINARY TRACT) 250-60 MG CAPS Take 1 tablet by mouth daily.   Yes [provider]  diazepam (VALIUM) 5 MG tablet Take 1 tablet (5 mg total) by mouth every 6 (six) hours as needed for anxiety. 03/01/16  Yes Paz, Alda Berthold, MD  doxazosin (CARDURA) 4 MG tablet Take 1 tablet (4 mg total) by mouth at bedtime. 12/03/17  Yes Paz, Alda Berthold, MD  fluticasone [facility]) 50 MCG/ACT nasal spray Place 2 sprays into both nostrils 2 (two) times daily. 07/05/17  Yes Paz, Alda Berthold, MD  HYDROcodone-acetaminophen (NORCO/VICODIN) 5-325 MG tablet Take 1 tablet every 12 hours as needed for pain. Patient taking differently: Take 1 tablet by mouth every 12 (twelve) hours as needed for moderate pain or severe pain.  06/05/17  Yes Paz, Alda Berthold, MD  meclizine (ANTIVERT) 25 MG tablet Take 1 tablet (25 mg total) by mouth daily as needed (vertigo). 03/01/16  Yes Paz, Alda Berthold, MD  montelukast (SINGULAIR) 10 MG tablet Take 1 tablet (10 mg total) by mouth daily. 01/07/18  Yes Paz, Alda Berthold, MD  morphine (MSIR) 15 MG tablet Take 1 tablet (15 mg total) by mouth every 4 (four) hours as needed for severe pain. 01/15/18  Yes Deno Etienne, DO  Multiple Vitamin (MULTIVITAMIN WITH MINERALS) TABS Take 1 tablet by mouth daily.   Yes [provider]  Probiotic Product (PROBIOTIC PO) Take 1 capsule by mouth daily.   Yes [provider]  ranitidine (ZANTAC) 300 MG tablet Take 1 tablet (300 mg total) by mouth at bedtime. 07/05/17  Yes Paz, Alda Berthold, MD    rOPINIRole (REQUIP) 1 MG tablet Take 1 tablet (1 mg total) by mouth 2 (two) times daily. 09/17/17  Yes Paz, Alda Berthold, MD  saccharomyces boulardii (FLORASTOR) 250 MG capsule Take 1 capsule (250 mg total) by mouth 2 (two) times daily. 03/09/16  Yes Paz, Alda Berthold, MD  simvastatin (ZOCOR) 40 MG tablet Take 1 tablet (40 mg total) by mouth daily. 12/12/17  Yes Paz, Alda Berthold, MD  tamsulosin (FLOMAX) 0.4 MG CAPS capsule Take 1 capsule (0.4 mg total) by mouth daily. 12/03/17  Yes Colon Branch, MD  triamterene-hydrochlorothiazide [facility]) 37.5-25 MG tablet Take 1/2 tablet by mouth daily 07/13/17  Yes Colon Branch, MD    Physical Exam: Vitals:   01/16/18 1530 01/16/18 1545 01/16/18 1600 01/16/18 1615  BP: (!) 152/78 (!) 155/85  (!) 157/87  Pulse: 69 66 67 70  Resp: 18 19 (!) 22 17  Temp:      TempSrc:      SpO2: 95% 99% 96% 96%  Weight:      Height:         General: Appears calm and comfortable Eyes:  PERRL, EOMI, normal lids, iris ENT:  grossly normal hearing, lips & tongue, mmm Neck:  no LAD, masses or thyromegaly Cardiovascular:  RRR, no m/r/g. No LE edema.  Respiratory:  CTA bilaterally, no w/r/r. Normal respiratory effort. Abdomen:  soft, ntnd, NABS Skin: no rash or induration seen on limited exam Musculoskeletal:  grossly normal tone BUE/BLE, good ROM, no bony abnormality Psychiatric:  grossly normal mood and affect, speech fluent and  appropriate, AOx3 Neurologic: CN 2-12 grossly intact, moves all extremities in coordinated fashion, sensation intact  Labs on Admission: I have personally reviewed following labs and imaging studies  CBC: Recent Labs  Lab 01/16/18 1556  WBC 12.5*  NEUTROABS 9.6*  HGB 15.2  HCT 48.3  MCV 97.6  PLT 423   Basic Metabolic Panel: Recent Labs  Lab 01/16/18 1556  NA 141  K 3.9  CL 104  CO2 24  GLUCOSE 75  BUN 15  CREATININE 1.46*  CALCIUM 8.7*   GFR: Estimated Creatinine Clearance: 31.9 mL/min (A) (by C-G formula based on SCr of 1.46 mg/dL  (H)). Liver Function Tests: No results for input(s): AST, ALT, ALKPHOS, BILITOT, PROT, ALBUMIN in the last 168 hours. No results for input(s): LIPASE, AMYLASE in the last 168 hours. No results for input(s): AMMONIA in the last 168 hours. Coagulation Profile: No results for input(s): INR, PROTIME in the last 168 hours. Cardiac Enzymes: No results for input(s): CKTOTAL, CKMB, CKMBINDEX, TROPONINI in the last 168 hours. BNP (last 3 results) No results for input(s): PROBNP in the last 8760 hours. HbA1C: No results for input(s): HGBA1C in the last 72 hours. CBG: No results for input(s): GLUCAP in the last 168 hours. Lipid Profile: No results for input(s): CHOL, HDL, LDLCALC, TRIG, CHOLHDL, LDLDIRECT in the last 72 hours. Thyroid Function Tests: No results for input(s): TSH, T4TOTAL, FREET4, T3FREE, THYROIDAB in the last 72 hours. Anemia Panel: No results for input(s): VITAMINB12, FOLATE, FERRITIN, TIBC, IRON, RETICCTPCT in the last 72 hours. Urine analysis:    Component Value Date/Time   COLORURINE YELLOW 07/23/2017 [place] 07/23/2017 1159   LABSPEC 1.020 07/23/2017 1159   PHURINE 7.0 07/23/2017 1159   GLUCOSEU NEGATIVE 07/23/2017 1159   HGBUR NEGATIVE 07/23/2017 1159   BILIRUBINUR NEGATIVE 07/23/2017 1159   KETONESUR NEGATIVE 07/23/2017 1159   PROTEINUR NEGATIVE 07/23/2017 1159   UROBILINOGEN 1.0 01/23/2013 0915   NITRITE NEGATIVE 07/23/2017 1159   LEUKOCYTESUR NEGATIVE 07/23/2017 1159    Creatinine Clearance: Estimated Creatinine Clearance: 31.9 mL/min (A) (by C-G formula based on SCr of 1.46 mg/dL (H)).  Sepsis Labs: @LABRCNTIP (procalcitonin:4,lacticidven:4) )No results found for this or any previous visit (from the past 240 hour(s)).   Radiological Exams on Admission: Dg Chest 2 View  Result Date: 01/16/2018 CLINICAL DATA:  Worsening rib pain since falling yesterday, LEFT side chest pain since EXAM: CHEST - 2 VIEW COMPARISON:  01/15/2018 FINDINGS:  Normal heart size, mediastinal contours, and pulmonary vascularity. Atherosclerotic calcifications aorta. Bibasilar atelectasis greater on LEFT. Small LEFT pleural effusion. Remaining lungs clear. No pneumothorax. Bones demineralized. Fractures of the lateral fifth sixth and seventh ribs identified. Degenerative changes of the thoracic spine. IMPRESSION: Fractures of the lateral LEFT fifth sixth and seventh ribs with associated basilar atelectasis and small LEFT pleural effusion. Electronically Signed   By: Lavonia Dana M.D.   On: 01/16/2018 16:49   Dg Ribs Unilateral W/chest Left  Result Date: 01/15/2018 CLINICAL DATA:  LEFT chest and rib pain following fall. Initial encounter. EXAM: LEFT RIBS AND CHEST - 3+ VIEW COMPARISON:  None. FINDINGS: Acute fractures of the LEFT 6th, 7th and 8th ribs noted. No pleural effusion or pneumothorax. Cardiomediastinal silhouette is unchanged. No airspace disease or pleural effusion. No other acute abnormalities identified. IMPRESSION: Acute LEFT 6th, 7th and 8th rib fractures. No pleural effusion or pneumothorax. Electronically Signed   By: Margarette Canada M.D.   On: 01/15/2018 19:17   Ct Head Wo Contrast  Result Date:  01/15/2018 CLINICAL DATA:  84 year old male post fall. Denies loss of consciousness. Does not think he hit his head. Initial encounter. EXAM: CT HEAD WITHOUT CONTRAST TECHNIQUE: Contiguous axial images were obtained from the base of the skull through the vertex without intravenous contrast. COMPARISON:  01/16/2013 CT. FINDINGS: Brain: Remote small right frontal subdural collection slightly smaller than on prior exam now with maximal thickness of 3 mm versus prior 4 mm. Slightly asymmetric prominent extra-axial spaces larger on the right supratentorially and symmetric infratentorially may reflect result of atrophy versus subdural hygromas. Overall, no acute intracranial hemorrhage or CT evidence of large acute infarct. Remote basal ganglia/corona radiata infarcts and  chronic microvascular changes. Moderate to marked global atrophy. No intracranial mass lesion noted on this unenhanced exam. Vascular: Vascular calcifications Skull: No skull fracture. Sinuses/Orbits: No acute orbital abnormality. Visualized paranasal sinuses are clear. Other: Prior right mastoidectomy. Temporomandibular joint degenerative changes. IMPRESSION: No skull fracture or acute intracranial hemorrhage. Chronic changes as detailed above. Electronically Signed   By: Genia Del M.D.   On: 01/15/2018 19:06   Dg Knee Complete 4 Views Left  Result Date: 01/15/2018 CLINICAL DATA:  Knee pain after fall EXAM: LEFT KNEE - COMPLETE 4+ VIEW COMPARISON:  06/26/2017 FINDINGS: Status post left knee replacement with normal alignment. Suspected interval revision of the prosthesis. No definite acute displaced fracture is seen. Similar appearance of cortical bone thickening at the distal femur cortical bone thickening at the distal femur is not significantly changed. Suspected knee effusion. Dense vascular calcifications. IMPRESSION: Status post left knee replacement without definite acute osseous abnormality. Suspected knee effusion. Electronically Signed   By: Donavan Foil M.D.   On: 01/15/2018 19:21    EKG:   Assessment/Plan Active Problems:   Fall   1] status post mechanical fall with multiple left rib fractures-patient will be admitted for pain control.  I will start him on Dilaudid since morphine is not working for him.  I will also obtain PT OT evaluation.  Patient might need rehab on discharge prior to going home.  Patient has no signs of infection or cardiac reason for fall at this time.   2] history of COPD restart pro-air, Tessalon Perles, Symbicort, Singulair.  3] history of BPH restart Flomax and Cardura.  4] chronic constipation I will place him on stool softeners and laxatives as needed.  5] hypertension restart Cardura.  Restart Maxide at some point before discharge.  He appears mildly  dehydrated I will give him some IV fluids for tonight.  6]mildly abnormal thyroid panel-recheck in 6 weeks.   DVT prophylaxis: Lovenox Code Status: Full code Family Communication: Discussed with daughter who was in the room Disposition Plan: TBD Consults called: None Admission status: Observation   Georgette Shell MD [facility]  If 7PM-7AM, please contact night-coverage [email or web address] Password [facility]  01/16/2018, 6:16 PM

## 2018-01-16 NOTE — ED Provider Notes (Signed)
[facility] EMERGENCY DEPARTMENT Provider Note   CSN: [id number] Arrival date & time: 01/16/18  1403     History   Chief Complaint Chief Complaint  Patient presents with  . Chest Pain    HPI CHRISOPHER PUSTEJOVSKY is a 84 y.o. male.  HPI Patient presents with concern for ongoing chest pain. Patient had a fall yesterday, mechanical, and he recalls the event.  When he was seen at our affiliated center, diagnosed with multiple rib fractures. Admission was discussed, patient elected to go home to attempt to use morphine for pain control. In spite of using what morphine, 50 mg, every few hours, he has had worsening sharp severe pain in the left side, worse with inspiration. No other new complaints, though he does have pain in multiple areas, he notes that this is been present since yesterday. Is here with his daughter who assists with the HPI.  Past Medical History:  Diagnosis Date  . Arthritis    left leg also has swelling  . Asthma   . Blood dyscrasia    pt CAN NOT have any blood thinners d/t hx brain bleed  . Chronic kidney disease, stage III (moderate) ([place]) 01/20/2013  . Complication of anesthesia    confusion with anesthesia  . Constipation    miralax prn  . COPD (chronic obstructive pulmonary disease) (HCC)    emphysema  . Depression    takes wellbutrin bid  . Epidural abscess   . Gastric ulcer   . History of blood clots   . Hyperlipidemia   . Melanoma ([place])   . Peripheral neuropathy   . Pneumonia    as a child and in 1952;had a pneumonia vaccine couple of years ago  . Prosthetic joint infection ([place])   . S/P right heart catheterization    at least 56yrs  . Shingles   . Short-term memory loss   . Shortness of breath    with exertion  . Sleep apnea    has CPAP but doesn't use it;sleep study done at least 30yrs ago  . Staphylococcus aureus bacteremia with sepsis ([place])   . Subdural hematoma ([place]) 03/30/11  . Urinary frequency    wears depends     Patient Active Problem List   Diagnosis Date Noted  . CAP (community acquired pneumonia) 06/25/2017  . UTI (urinary tract infection) 06/24/2017  . Sepsis ([place]) 06/24/2017  . Compression fracture of T12 vertebra ([place]) 06/24/2017  . Generalized weakness 06/24/2017  . Presence of left artificial knee joint 07/24/2016  . Chronic pain of left knee 07/24/2016  . Chronic midline low back pain without sciatica 07/24/2016  . Depression 07/09/2016  . C. difficile diarrhea 05/04/2016  . Chronic cough 08/16/2015  . PCP NOTES >>>>>> 04/06/2015  . CTS (carpal tunnel syndrome) 11/16/2014  . Hyperlipidemia 08/17/2014  . Superficial bruising of chest wall 07/29/2014  . Urgency incontinence 01/23/2013  . Chronic kidney disease, stage III (moderate) ([place]) 01/20/2013  . At high risk for falls 11/28/2012  . ALLERGIC RHINITIS 12/09/2009  . Carcinoma in situ of skin 04/30/2008  . RESTLESS LEG SYNDROME 04/30/2008  . NEUROPATHY 04/30/2008  . GLAUCOMA, BORDERLINE 04/30/2008  . Meniere's disease 04/30/2008  . HEARING LOSS 04/30/2008  . COPD GOLD II  04/30/2008  . DJD (degenerative joint disease) 04/30/2008  . Osteoporosis 04/30/2008  . Sleep apnea 04/30/2008  . HISTORY OF ASBESTOS EXPOSURE 04/30/2008    Past Surgical History:  Procedure Laterality Date  . epidural abscess drainage  12/2010  .  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 EYE SURGERY     bil cataract surgery  . FILTERING PROCEDURE     filter placed in neck d/t blood  clot  . HERNIA REPAIR     25+yrs ago  . KNEE ARTHROSCOPY    . SKIN BIOPSY     melanoma on head   . TOTAL KNEE ARTHROPLASTY     x 2 left knee 2003/2012  . TOTAL KNEE REVISION  05/23/2011   Procedure: TOTAL KNEE REVISION;  Surgeon: Meredith Pel;  Location: [facility];  Service: Orthopedics;  Laterality: Left;  LEFT KNEE REMOVAL OF SPACER, POSSIBLE REIMPLANTATION OF REVISION TKA VERSES REPLACEMENT ANTIBIOTIC SPACER        Home  Medications    Prior to Admission medications   Medication Sig Start Date End Date Taking? Authorizing Provider  acetaminophen (TYLENOL) 325 MG tablet Take 2 tablets (650 mg total) by mouth every 6 (six) hours as needed for mild pain (or Fever >/= 101). 02/22/16  Yes Regalado, Belkys A, MD  albuterol (PROAIR HFA) 108 (90 BASE) MCG/ACT inhaler Inhale 2 puffs into the lungs every 6 (six) hours as needed for wheezing. 07/31/14  Yes Elsie Stain, MD  benzonatate (TESSALON) 200 MG capsule TAKE ONE CAPSULE 3-4 TIMES DAILY AS NEEDED FOR COUGH Patient taking differently: Take 200 mg by mouth 2 (two) times daily as needed for cough. TAKE ONE CAPSULE 3-4 TIMES DAILY AS NEEDED FOR COUGH 08/16/15  Yes Tanda Rockers, MD  budesonide-formoterol [facility]) 160-4.5 MCG/ACT inhaler Inhale 2 puffs into the lungs 2 (two) times daily. 07/05/17  Yes Paz, Alda Berthold, MD  buPROPion [facility] SR) 200 MG 12 hr tablet Take 1 tablet (200 mg total) by mouth 2 (two) times daily. 12/03/17  Yes Paz, Alda Berthold, MD  calcium carbonate (OS-CAL) 600 MG TABS Take 600 mg by mouth daily.    Yes [provider]  Cranberry-Vitamin C (AZO CRANBERRY URINARY TRACT) 250-60 MG CAPS Take 1 tablet by mouth daily.   Yes [provider]  diazepam (VALIUM) 5 MG tablet Take 1 tablet (5 mg total) by mouth every 6 (six) hours as needed for anxiety. 03/01/16  Yes Paz, Alda Berthold, MD  doxazosin (CARDURA) 4 MG tablet Take 1 tablet (4 mg total) by mouth at bedtime. 12/03/17  Yes Paz, Alda Berthold, MD  fluticasone [facility]) 50 MCG/ACT nasal spray Place 2 sprays into both nostrils 2 (two) times daily. 07/05/17  Yes Paz, Alda Berthold, MD  HYDROcodone-acetaminophen (NORCO/VICODIN) 5-325 MG tablet Take 1 tablet every 12 hours as needed for pain. Patient taking differently: Take 1 tablet by mouth every 12 (twelve) hours as needed for moderate pain or severe pain.  06/05/17  Yes Paz, Alda Berthold, MD  meclizine (ANTIVERT) 25 MG tablet Take 1 tablet (25 mg total) by mouth  daily as needed (vertigo). 03/01/16  Yes Paz, Alda Berthold, MD  montelukast (SINGULAIR) 10 MG tablet Take 1 tablet (10 mg total) by mouth daily. 01/07/18  Yes Paz, Alda Berthold, MD  morphine (MSIR) 15 MG tablet Take 1 tablet (15 mg total) by mouth every 4 (four) hours as needed for severe pain. 01/15/18  Yes Deno Etienne, DO  Multiple Vitamin (MULTIVITAMIN WITH MINERALS) TABS Take 1 tablet by mouth daily.   Yes [provider]  Probiotic Product (PROBIOTIC PO) Take 1 capsule by mouth daily.   Yes [provider]  ranitidine (ZANTAC) 300 MG tablet  Take 1 tablet (300 mg total) by mouth at bedtime. 07/05/17  Yes Paz, Alda Berthold, MD  rOPINIRole (REQUIP) 1 MG tablet Take 1 tablet (1 mg total) by mouth 2 (two) times daily. 09/17/17  Yes Paz, Alda Berthold, MD  saccharomyces boulardii (FLORASTOR) 250 MG capsule Take 1 capsule (250 mg total) by mouth 2 (two) times daily. 03/09/16  Yes Paz, Alda Berthold, MD  simvastatin (ZOCOR) 40 MG tablet Take 1 tablet (40 mg total) by mouth daily. 12/12/17  Yes Paz, Alda Berthold, MD  tamsulosin (FLOMAX) 0.4 MG CAPS capsule Take 1 capsule (0.4 mg total) by mouth daily. 12/03/17  Yes Colon Branch, MD  triamterene-hydrochlorothiazide [facility]) 37.5-25 MG tablet Take 1/2 tablet by mouth daily 07/13/17  Yes Colon Branch, MD    Family History Family History  Problem Relation Age of Onset  . Melanoma Brother   . CAD Neg Hx   . Diabetes Neg Hx     Social History Social History   Tobacco Use  . Smoking status: Former Smoker    Packs/day: 2.00    Years: 40.00    Pack years: 80.00    Types: Cigarettes    Last attempt to quit: 07/17/1978    Years since quitting: 39.5  . Smokeless tobacco: Never Used  Substance Use Topics  . Alcohol use: No  . Drug use: No     Allergies   Anticoagulant compound and Protonix [pantoprazole sodium]   Review of Systems Review of Systems  Constitutional:       Per HPI, otherwise negative  HENT:       Per HPI, otherwise negative  Respiratory:       Per  HPI, otherwise negative  Cardiovascular:       Per HPI, otherwise negative  Gastrointestinal: Negative for vomiting.  Endocrine:       Negative aside from HPI  Genitourinary:       Neg aside from HPI   Musculoskeletal:       Per HPI, otherwise negative  Skin: Positive for wound.  Neurological: Negative for syncope.     Physical Exam Updated Vital Signs BP (!) 157/87   Pulse 70   Temp 98.1 F (36.7 C) (Oral)   Resp 17   Ht 5' 9.5" (1.765 m)   Wt 67.1 kg (148 lb)   SpO2 96%   BMI 21.54 kg/m   Physical Exam  Constitutional: He is oriented to person, place, and time. No distress.  HENT:  Head: Normocephalic and atraumatic.  Eyes: Conjunctivae and EOM are normal.  Cardiovascular: Normal rate and regular rhythm.  Pulmonary/Chest: Effort normal. No stridor. No respiratory distress.  Tender to palpation with mild to pressure on the anterior chest wall  Abdominal: He exhibits no distension.  Musculoskeletal: He exhibits no edema.  Neurological: He is alert and oriented to person, place, and time.  Skin: Skin is warm and dry.     Psychiatric: He has a normal mood and affect.  Nursing note and vitals reviewed.    ED Treatments / Results  Labs (all labs ordered are listed, but only abnormal results are displayed) Labs Reviewed  BASIC METABOLIC PANEL - Abnormal; Notable for the following components:      Result Value   Creatinine, Ser 1.46 (*)    Calcium 8.7 (*)    GFR calc non Af Amer 41 (*)    GFR calc Af Amer 47 (*)    All other components within normal limits  CBC WITH DIFFERENTIAL/PLATELET -  Abnormal; Notable for the following components:   WBC 12.5 (*)    Neutro Abs 9.6 (*)    Monocytes Absolute 1.7 (*)    All other components within normal limits    EKG None  Radiology Dg Chest 2 View  Result Date: 01/16/2018 CLINICAL DATA:  Worsening rib pain since falling yesterday, LEFT side chest pain since EXAM: CHEST - 2 VIEW COMPARISON:  01/15/2018 FINDINGS:  Normal heart size, mediastinal contours, and pulmonary vascularity. Atherosclerotic calcifications aorta. Bibasilar atelectasis greater on LEFT. Small LEFT pleural effusion. Remaining lungs clear. No pneumothorax. Bones demineralized. Fractures of the lateral fifth sixth and seventh ribs identified. Degenerative changes of the thoracic spine. IMPRESSION: Fractures of the lateral LEFT fifth sixth and seventh ribs with associated basilar atelectasis and small LEFT pleural effusion. Electronically Signed   By: Lavonia Dana M.D.   On: 01/16/2018 16:49   Dg Ribs Unilateral W/chest Left  Result Date: 01/15/2018 CLINICAL DATA:  LEFT chest and rib pain following fall. Initial encounter. EXAM: LEFT RIBS AND CHEST - 3+ VIEW COMPARISON:  None. FINDINGS: Acute fractures of the LEFT 6th, 7th and 8th ribs noted. No pleural effusion or pneumothorax. Cardiomediastinal silhouette is unchanged. No airspace disease or pleural effusion. No other acute abnormalities identified. IMPRESSION: Acute LEFT 6th, 7th and 8th rib fractures. No pleural effusion or pneumothorax. Electronically Signed   By: Margarette Canada M.D.   On: 01/15/2018 19:17   Ct Head Wo Contrast  Result Date: 01/15/2018 CLINICAL DATA:  83 year old male post fall. Denies loss of consciousness. Does not think he hit his head. Initial encounter. EXAM: CT HEAD WITHOUT CONTRAST TECHNIQUE: Contiguous axial images were obtained from the base of the skull through the vertex without intravenous contrast. COMPARISON:  01/16/2013 CT. FINDINGS: Brain: Remote small right frontal subdural collection slightly smaller than on prior exam now with maximal thickness of 3 mm versus prior 4 mm. Slightly asymmetric prominent extra-axial spaces larger on the right supratentorially and symmetric infratentorially may reflect result of atrophy versus subdural hygromas. Overall, no acute intracranial hemorrhage or CT evidence of large acute infarct. Remote basal ganglia/corona radiata infarcts and  chronic microvascular changes. Moderate to marked global atrophy. No intracranial mass lesion noted on this unenhanced exam. Vascular: Vascular calcifications Skull: No skull fracture. Sinuses/Orbits: No acute orbital abnormality. Visualized paranasal sinuses are clear. Other: Prior right mastoidectomy. Temporomandibular joint degenerative changes. IMPRESSION: No skull fracture or acute intracranial hemorrhage. Chronic changes as detailed above. Electronically Signed   By: Genia Del M.D.   On: 01/15/2018 19:06   Dg Knee Complete 4 Views Left  Result Date: 01/15/2018 CLINICAL DATA:  Knee pain after fall EXAM: LEFT KNEE - COMPLETE 4+ VIEW COMPARISON:  06/26/2017 FINDINGS: Status post left knee replacement with normal alignment. Suspected interval revision of the prosthesis. No definite acute displaced fracture is seen. Similar appearance of cortical bone thickening at the distal femur cortical bone thickening at the distal femur is not significantly changed. Suspected knee effusion. Dense vascular calcifications. IMPRESSION: Status post left knee replacement without definite acute osseous abnormality. Suspected knee effusion. Electronically Signed   By: Donavan Foil M.D.   On: 01/15/2018 19:21    Procedures Procedures (including critical care time)  Medications Ordered in ED Medications  diclofenac (FLECTOR) 1.3 % 1 patch (has no administration in time range)  HYDROmorphone (DILAUDID) injection 1 mg (1 mg Intravenous Given 01/16/18 1621)     Initial Impression / Assessment and Plan / ED Course  I have reviewed the triage  vital signs and the nursing notes.  Pertinent labs & imaging results that were available during my care of the patient were reviewed by me and considered in my medical decision making (see chart for details).    After the initial evaluation I reviewed the patient's chart including documentation from CT scan, x-ray yesterday with notation of multiple rib fractures.   This  patient presents when after fall, with previously identified rib fractures, but uncontrolled pain in spite of home morphine use. No evidence for new pneumonia, no evidence for new injuries,  given the need for ongoing pain management of his rib fractures patient was admitted for further evaluation and management after initial narcotics and diclofenac provided in the emergency department.  Patient also received an initial incentive spirometry, absent evidence for pneumonia thus far, initial antibiotics not indicated.  Final Clinical Impressions(s) / ED Diagnoses   Final diagnoses:  Closed fracture of multiple ribs of left side, initial encounter     Carmin Muskrat, MD 01/16/18 1735

## 2018-01-16 NOTE — Telephone Encounter (Signed)
Pt back in ED now.

## 2018-01-17 DIAGNOSIS — D72829 Elevated white blood cell count, unspecified: Secondary | ICD-10-CM | POA: Diagnosis present

## 2018-01-17 DIAGNOSIS — J449 Chronic obstructive pulmonary disease, unspecified: Secondary | ICD-10-CM | POA: Diagnosis present

## 2018-01-17 DIAGNOSIS — R410 Disorientation, unspecified: Secondary | ICD-10-CM | POA: Diagnosis not present

## 2018-01-17 DIAGNOSIS — R946 Abnormal results of thyroid function studies: Secondary | ICD-10-CM | POA: Diagnosis present

## 2018-01-17 DIAGNOSIS — N183 Chronic kidney disease, stage 3 (moderate): Secondary | ICD-10-CM | POA: Diagnosis present

## 2018-01-17 DIAGNOSIS — I10 Essential (primary) hypertension: Secondary | ICD-10-CM | POA: Diagnosis not present

## 2018-01-17 DIAGNOSIS — W19XXXA Unspecified fall, initial encounter: Secondary | ICD-10-CM | POA: Diagnosis not present

## 2018-01-17 DIAGNOSIS — I129 Hypertensive chronic kidney disease with stage 1 through stage 4 chronic kidney disease, or unspecified chronic kidney disease: Secondary | ICD-10-CM | POA: Diagnosis present

## 2018-01-17 DIAGNOSIS — S2249XA Multiple fractures of ribs, unspecified side, initial encounter for closed fracture: Secondary | ICD-10-CM | POA: Diagnosis present

## 2018-01-17 DIAGNOSIS — Z87891 Personal history of nicotine dependence: Secondary | ICD-10-CM | POA: Diagnosis not present

## 2018-01-17 DIAGNOSIS — F0390 Unspecified dementia without behavioral disturbance: Secondary | ICD-10-CM | POA: Diagnosis present

## 2018-01-17 DIAGNOSIS — K5909 Other constipation: Secondary | ICD-10-CM | POA: Diagnosis present

## 2018-01-17 DIAGNOSIS — F329 Major depressive disorder, single episode, unspecified: Secondary | ICD-10-CM | POA: Diagnosis present

## 2018-01-17 DIAGNOSIS — S2242XA Multiple fractures of ribs, left side, initial encounter for closed fracture: Secondary | ICD-10-CM | POA: Diagnosis present

## 2018-01-17 DIAGNOSIS — Z7951 Long term (current) use of inhaled steroids: Secondary | ICD-10-CM | POA: Diagnosis not present

## 2018-01-17 DIAGNOSIS — E785 Hyperlipidemia, unspecified: Secondary | ICD-10-CM | POA: Diagnosis present

## 2018-01-17 DIAGNOSIS — T402X5A Adverse effect of other opioids, initial encounter: Secondary | ICD-10-CM | POA: Diagnosis not present

## 2018-01-17 DIAGNOSIS — E860 Dehydration: Secondary | ICD-10-CM | POA: Diagnosis present

## 2018-01-17 DIAGNOSIS — J9621 Acute and chronic respiratory failure with hypoxia: Secondary | ICD-10-CM | POA: Diagnosis not present

## 2018-01-17 DIAGNOSIS — W1830XA Fall on same level, unspecified, initial encounter: Secondary | ICD-10-CM | POA: Diagnosis present

## 2018-01-17 DIAGNOSIS — Z888 Allergy status to other drugs, medicaments and biological substances status: Secondary | ICD-10-CM | POA: Diagnosis not present

## 2018-01-17 DIAGNOSIS — G629 Polyneuropathy, unspecified: Secondary | ICD-10-CM | POA: Diagnosis present

## 2018-01-17 DIAGNOSIS — Z96652 Presence of left artificial knee joint: Secondary | ICD-10-CM | POA: Diagnosis present

## 2018-01-17 DIAGNOSIS — Z85820 Personal history of malignant melanoma of skin: Secondary | ICD-10-CM | POA: Diagnosis not present

## 2018-01-17 DIAGNOSIS — N400 Enlarged prostate without lower urinary tract symptoms: Secondary | ICD-10-CM | POA: Diagnosis present

## 2018-01-17 DIAGNOSIS — Z86718 Personal history of other venous thrombosis and embolism: Secondary | ICD-10-CM | POA: Diagnosis not present

## 2018-01-17 DIAGNOSIS — Z79899 Other long term (current) drug therapy: Secondary | ICD-10-CM | POA: Diagnosis not present

## 2018-01-17 DIAGNOSIS — G4733 Obstructive sleep apnea (adult) (pediatric): Secondary | ICD-10-CM | POA: Diagnosis present

## 2018-01-17 MED ORDER — SENNOSIDES-DOCUSATE SODIUM 8.6-50 MG PO TABS
1.0000 | ORAL_TABLET | Freq: Two times a day (BID) | ORAL | Status: DC
Start: 2018-01-17 — End: 2018-01-22
  Administered 2018-01-17 – 2018-01-22 (×10): 1 via ORAL
  Filled 2018-01-17 (×11): qty 1

## 2018-01-17 MED ORDER — OXYCODONE HCL 5 MG PO TABS
5.0000 mg | ORAL_TABLET | ORAL | Status: DC | PRN
Start: 2018-01-17 — End: 2018-01-22
  Administered 2018-01-17 – 2018-01-20 (×13): 5 mg via ORAL
  Filled 2018-01-17 (×13): qty 1

## 2018-01-17 MED ORDER — ACETAMINOPHEN 500 MG PO TABS
1000.0000 mg | ORAL_TABLET | Freq: Three times a day (TID) | ORAL | Status: DC
  Administered 2018-01-17 – 2018-01-22 (×16): 1000 mg via ORAL
  Filled 2018-01-17 (×16): qty 2

## 2018-01-17 MED ORDER — DEXTROSE 5 % IV SOLN
500.0000 mg | Freq: Three times a day (TID) | INTRAVENOUS | Status: DC | PRN
  Filled 2018-01-17: qty 5

## 2018-01-17 MED ORDER — POLYETHYLENE GLYCOL 3350 17 G PO PACK
17.0000 g | PACK | Freq: Every day | ORAL | Status: DC
  Administered 2018-01-17 – 2018-01-22 (×6): 17 g via ORAL
  Filled 2018-01-17 (×6): qty 1

## 2018-01-17 NOTE — [place] FL2 (Signed)
[place] MEDICAID FL2 LEVEL OF CARE SCREENING TOOL     IDENTIFICATION  Patient Name: Micheal Frank Birthdate: Nov 26, 1925 Sex: male Admission Date (Current Location): 01/16/2018  [facility] and [place] Number:  Herbalist and Address:  The . [facility], [place] [address] [place], [place] [address]      Provider Number: [id number]  Attending Physician Name and Address:  Edwin Dada, *  Relative Name and Phone Number:  Herbert Deaner, daughter, [phone]    Current Level of Care: Hospital Recommended Level of Care: [place] Prior Approval Number:    Date Approved/Denied:   PASRR Number: [id number]  Discharge Plan: SNF    Current Diagnoses: Patient Active Problem List   Diagnosis Date Noted  . Multiple rib fractures 01/17/2018  . Fall 01/16/2018  . Essential hypertension 01/16/2018  . BPH (benign prostatic hyperplasia) 01/16/2018  . Multiple closed fractures of ribs of left side   . CAP (community acquired pneumonia) 06/25/2017  . UTI (urinary tract infection) 06/24/2017  . Sepsis ([place]) 06/24/2017  . Compression fracture of T12 vertebra ([place]) 06/24/2017  . Generalized weakness 06/24/2017  . Presence of left artificial knee joint 07/24/2016  . Chronic pain of left knee 07/24/2016  . Chronic midline low back pain without sciatica 07/24/2016  . Depression 07/09/2016  . C. difficile diarrhea 05/04/2016  . Chronic cough 08/16/2015  . PCP NOTES >>>>>> 04/06/2015  . CTS (carpal tunnel syndrome) 11/16/2014  . Hyperlipidemia 08/17/2014  . Superficial bruising of chest wall 07/29/2014  . Urgency incontinence 01/23/2013  . Chronic kidney disease, stage III (moderate) ([place]) 01/20/2013  . At high risk for falls 11/28/2012  . ALLERGIC RHINITIS 12/09/2009  . Carcinoma in situ of skin 04/30/2008  . RESTLESS LEG SYNDROME 04/30/2008  . NEUROPATHY 04/30/2008  . GLAUCOMA, BORDERLINE 04/30/2008  . Meniere's disease 04/30/2008   . HEARING LOSS 04/30/2008  . COPD GOLD II  04/30/2008  . DJD (degenerative joint disease) 04/30/2008  . Osteoporosis 04/30/2008  . Sleep apnea 04/30/2008  . HISTORY OF ASBESTOS EXPOSURE 04/30/2008    Orientation RESPIRATION BLADDER Height & Weight     Self, Time, Situation, Place  O2(Nasal Cannula 2L) Continent Weight: 148 lb (67.1 kg) Height:  5' 9.5" (176.5 cm)  BEHAVIORAL SYMPTOMS/MOOD NEUROLOGICAL BOWEL NUTRITION STATUS      Continent Diet(See DC Summary)  AMBULATORY STATUS COMMUNICATION OF NEEDS Skin   Extensive Assist Verbally Bruising(Rib Fractures)                       Personal Care Assistance Level of Assistance  Dressing, Bathing, Feeding Bathing Assistance: Maximum assistance Feeding assistance: Limited assistance Dressing Assistance: Maximum assistance     Functional Limitations Info  Sight, Hearing, Speech Sight Info: Adequate Hearing Info: Impaired Speech Info: Adequate    SPECIAL CARE FACTORS FREQUENCY  PT (By licensed PT), OT (By licensed OT)     PT Frequency: 3x week OT Frequency: 2x week            Contractures      Additional Factors Info  Code Status, Allergies, Psychotropic Code Status Info: Full Code Allergies Info: ANTICOAGULANT COMPOUND, PROTONIX PANTOPRAZOLE SODIUM  Psychotropic Info: welbutrin         Current Medications (01/17/2018):  This is the current hospital active medication list Current Facility-Administered Medications  Medication Dose Route Frequency Provider Last Rate Last Dose  . acetaminophen (TYLENOL) tablet 1,000 mg  1,000 mg Oral TID Edwin Dada,  MD   1,000 mg at 01/17/18 0931  . albuterol (PROVENTIL) (2.5 MG/3ML) 0.083% nebulizer solution 2.5 mg  2.5 mg Nebulization Q6H PRN Georgette Shell, MD      . benzonatate (TESSALON) capsule 200 mg  200 mg Oral BID PRN Georgette Shell, MD      . bisacodyl (DULCOLAX) EC tablet 5 mg  5 mg Oral Daily Georgette Shell, MD   5 mg at 01/17/18 0930  .  buPROPion (WELLBUTRIN SR) 12 hr tablet 200 mg  200 mg Oral BID Georgette Shell, MD   200 mg at 01/17/18 0930  . calcium carbonate (OS-CAL - dosed in mg of elemental calcium) tablet 1,250 mg  1,250 mg Oral Q supper Georgette Shell, MD      . diclofenac (FLECTOR) 1.3 % 1 patch  1 patch Transdermal BID Carmin Muskrat, MD   1 patch at 01/17/18 1253  . doxazosin (CARDURA) tablet 4 mg  4 mg Oral QHS Georgette Shell, MD   4 mg at 01/17/18 0036  . enoxaparin (LOVENOX) injection 30 mg  30 mg Subcutaneous Q24H Georgette Shell, MD   30 mg at 01/17/18 0035  . famotidine (PEPCID) tablet 20 mg  20 mg Oral Daily Georgette Shell, MD   20 mg at 01/17/18 0931  . fluticasone (FLONASE) 50 MCG/ACT nasal spray 2 spray  2 spray Each Nare BID Georgette Shell, MD   2 spray at 01/17/18 0931  . hydrALAZINE (APRESOLINE) injection 10 mg  10 mg Intravenous Q6H PRN Georgette Shell, MD      . HYDROmorphone (DILAUDID) injection 0.5 mg  0.5 mg Intravenous Q3H PRN Georgette Shell, MD   0.5 mg at 01/17/18 0754  . meclizine (ANTIVERT) tablet 25 mg  25 mg Oral Daily PRN Georgette Shell, MD      . methocarbamol (ROBAXIN) 500 mg in dextrose 5 % 50 mL IVPB  500 mg Intravenous Q8H PRN Danford, Suann Larry, MD      . mometasone-formoterol (DULERA) 200-5 MCG/ACT inhaler 2 puff  2 puff Inhalation BID Georgette Shell, MD   2 puff at 01/17/18 0900  . montelukast (SINGULAIR) tablet 10 mg  10 mg Oral Daily Georgette Shell, MD   10 mg at 01/17/18 0931  . multivitamin with minerals tablet 1 tablet  1 tablet Oral Daily Georgette Shell, MD   1 tablet at 01/17/18 0931  . oxyCODONE (Oxy IR/ROXICODONE) immediate release tablet 5 mg  5 mg Oral Q4H PRN Edwin Dada, MD   5 mg at 01/17/18 1337  . polyethylene glycol (MIRALAX / GLYCOLAX) packet 17 g  17 g Oral Daily Danford, Suann Larry, MD   17 g at 01/17/18 0930  . rOPINIRole (REQUIP) tablet 1 mg  1 mg Oral BID Georgette Shell,  MD   1 mg at 01/17/18 0931  . senna-docusate (Senokot-S) tablet 1 tablet  1 tablet Oral BID Edwin Dada, MD   1 tablet at 01/17/18 0930  . simvastatin (ZOCOR) tablet 40 mg  40 mg Oral q1800 Georgette Shell, MD   40 mg at 01/17/18 0055  . tamsulosin (FLOMAX) capsule 0.4 mg  0.4 mg Oral Daily Georgette Shell, MD   0.4 mg at 01/17/18 0930  . vitamin C (ASCORBIC ACID) tablet 500 mg  500 mg Oral Daily Georgette Shell, MD   500 mg at 01/17/18 0930     Discharge Medications: Please see discharge summary for  a list of discharge medications.  Relevant Imaging Results:  Relevant Lab Results:   Additional Information SS#: [address] [place], LCSW

## 2018-01-17 NOTE — Evaluation (Signed)
Physical Therapy Evaluation Patient Details Name: Micheal Frank MRN: [id number] DOB: [birth year] Today's Date: 01/17/2018   History of Present Illness  Pt is a 84 y/o male admitted after falling at home in which he sustained multiple L rib fractures. PMH including but not limited to CKD, COPD, HLD, L TKA in 2003 with revision in 2012.    Clinical Impression  Pt presented supine in bed with HOB elevated, awake and willing to participate in therapy session. Prior to admission, pt reported that he ambulated with use of SPC and was independent with all ADLs. Pt lives with his daughter and grandson in a house with a ramped entrance where he can stay on the main level. Pt currently requires max-total A x2 for bed mobility. Pt very limited at this time secondary to significant pain and weakness. Pt would continue to benefit from skilled physical therapy services at this time while admitted and after d/c to address the below listed limitations in order to improve overall safety and independence with functional mobility.     Follow Up Recommendations SNF    Equipment Recommendations  None recommended by PT    Recommendations for Other Services       Precautions / Restrictions Precautions Precautions: Fall Restrictions Weight Bearing Restrictions: No      Mobility  Bed Mobility Overal bed mobility: Needs Assistance Bed Mobility: Rolling Rolling: Max assist         General bed mobility comments: attempted supine to sit with max A to move bilateral LEs off of bed; however, pt with increased pain in R knee and L side of trunk, then resisting movement and returning to supine; pt required max A to roll towards his R side with use of bed pads. He again experienced increased pain and was then resisting all movement. Pt required total A x2 for repositioning in bed at end of session  Transfers                 General transfer comment: unable; pt will require mechanical lift at this  time  Ambulation/Gait                Stairs            Wheelchair Mobility    Modified Rankin (Stroke Patients Only)       Balance Overall balance assessment: History of Falls                                           Pertinent Vitals/Pain Pain Assessment: Faces Faces Pain Scale: Hurts whole lot Pain Location: L side of trunk Pain Descriptors / Indicators: Sore;Grimacing;Guarding;Moaning Pain Intervention(s): Monitored during session;Repositioned;Limited activity within patient's tolerance    Home Living Family/patient expects to be discharged to:: Private residence Living Arrangements: Children;Other relatives Available Help at Discharge: Family;Available 24 hours/day Type of Home: House Home Access: Ramped entrance     Home Layout: Multi-level;Able to live on main level with bedroom/bathroom Home Equipment: [place]ud[place] - single point;Walker - 2 wheels;Shower seat      Prior Function Level of Independence: Independent with assistive device(s)         Comments: ambulates with SPC     Hand Dominance        Extremity/Trunk Assessment   Upper Extremity Assessment Upper Extremity Assessment: Defer to OT evaluation;Generalized weakness    Lower Extremity Assessment Lower Extremity Assessment: Generalized  weakness    Cervical / Trunk Assessment Cervical / Trunk Assessment: Kyphotic  Communication   Communication: HOH  Cognition Arousal/Alertness: Awake/alert Behavior During Therapy: WFL for tasks assessed/performed Overall Cognitive Status: No family/caregiver present to determine baseline cognitive functioning Area of Impairment: Memory;Following commands;Problem solving                     Memory: Decreased short-term memory Following Commands: Follows one step commands with increased time     Problem Solving: Slow processing;Decreased initiation;Difficulty sequencing;Requires verbal cues;Requires tactile cues         General Comments      Exercises     Assessment/Plan    PT Assessment Patient needs continued PT services  PT Problem List Decreased strength;Decreased activity tolerance;Decreased range of motion;Decreased balance;Decreased mobility;Decreased coordination;Decreased cognition;Decreased knowledge of use of DME;Decreased safety awareness;Decreased knowledge of precautions;Pain       PT Treatment Interventions DME instruction;Gait training;Stair training;Functional mobility training;Therapeutic activities;Therapeutic exercise;Neuromuscular re-education;Balance training;Patient/family education    PT Goals (Current goals can be found in the Care Plan section)  Acute Rehab PT Goals Patient Stated Goal: decrease pain, play golf PT Goal Formulation: With patient Time For Goal Achievement: 01/31/18 Potential to Achieve Goals: Fair    Frequency Min 3X/week   Barriers to discharge        Co-evaluation               AM-PAC PT "6 Clicks" Daily Activity  Outcome Measure Difficulty turning over in bed (including adjusting bedclothes, sheets and blankets)?: Unable Difficulty moving from lying on back to sitting on the side of the bed? : Unable Difficulty sitting down on and standing up from a chair with arms (e.g., wheelchair, bedside commode, etc,.)?: Unable Help needed moving to and from a bed to chair (including a wheelchair)?: Total Help needed walking in hospital room?: Total Help needed climbing 3-5 steps with a railing? : Total 6 Click Score: 6    End of Session   Activity Tolerance: Patient limited by pain Patient left: in bed;with call bell/phone within reach;with bed alarm set Nurse Communication: Mobility status;Need for lift equipment PT Visit Diagnosis: Other abnormalities of gait and mobility (R26.89);Pain Pain - Right/Left: Right Pain - part of body: Knee(and L trunk)    Time: 8309-4076 PT Time Calculation (min) (ACUTE ONLY): 26 min   Charges:   PT  Evaluation $PT Eval Moderate Complexity: 1 Mod PT Treatments $Therapeutic Activity: 8-22 mins   PT G Codes:        [place], PT, DPT [place] 01/17/2018, 10:48 AM

## 2018-01-17 NOTE — Care Management Obs Status (Signed)
[facility] NOTIFICATION   Patient Details  Name: Micheal Frank MRN: [id number] Date of Birth: June 01, 1926   Medicare Observation Status Notification Given:  Yes    Ninfa Meeker, RN 01/17/2018, 10:59 AM

## 2018-01-17 NOTE — Progress Notes (Signed)
Occupational Therapy Evaluation Patient Details Name: Micheal Frank MRN: [id number] DOB: [birth year] Today's Date: 01/17/2018    History of Present Illness Pt is a 84 y/o male admitted after falling at home in which he sustained multiple L rib fractures. PMH including but not limited to CKD, COPD, HLD, L TKA in 2003 with revision in 2012.   Clinical Impression   PTA patient reports independent with ADL, IADL and mobility using cane.  He currently is limited to bed level ADL (mod assist for UB ADL, total assist for LB ADL), unable to complete toilet transfers at time of eval.  Patient attempted bed mobility, but after initation of B LE movement towards EOB pain increased and patient resisted further movement.  Patient is significantly limited by pain, provided pillow to support pain management when coughing or moving with limited carryover of use during session.  At this time patient reports he really wants to go home, but therapist recommends SNF to regain strength, mobility, maximize independence with ADL and safety prior to dc home.  Will continue to follow while admitted.     Follow Up Recommendations  SNF;Supervision/Assistance - 24 hour    Equipment Recommendations  3 in 1 bedside commode    Recommendations for Other Services       Precautions / Restrictions Precautions Precautions: Fall Restrictions Weight Bearing Restrictions: No      Mobility Bed Mobility Overal bed mobility: Needs Assistance             General bed mobility comments: attempted to transition to EOB towards R side, patient initating B LE towards R side with increased time but resisting slight movement as moaning in pain.     Transfers                 General transfer comment: unable; will require mechanical lift at this time     Balance Overall balance assessment: History of Falls                                         ADL either performed or assessed with clinical  judgement   ADL Overall ADL's : Needs assistance/impaired Eating/Feeding: Set up;Bed level   Grooming: Set up;Bed level   Upper Body Bathing: Moderate assistance;Bed level Upper Body Bathing Details (indicate cue type and reason): anticipate needing assistance due to pain with movement of L side Lower Body Bathing: Total assistance;Bed level   Upper Body Dressing : Moderate assistance;Bed level   Lower Body Dressing: Bed level;Total assistance     Toilet Transfer Details (indicate cue type and reason): unsafe to attempt at this time  Toileting- Clothing Manipulation and Hygiene: Total assistance;Bed level       Functional mobility during ADLs: (unable to assess ) General ADL Comments: Patient signficantly limited by pain in L side trunk at this time with any movement to simulate self care tasks.       Vision Baseline Vision/History: Wears glasses Wears Glasses: Reading only Patient Visual Report: No change from baseline Vision Assessment?: No apparent visual deficits     Perception     Praxis      Pertinent Vitals/Pain Pain Assessment: Faces Faces Pain Scale: Hurts whole lot Pain Location: L side of trunk Pain Descriptors / Indicators: Sore;Grimacing;Guarding;Moaning Pain Intervention(s): Monitored during session;Limited activity within patient's tolerance;Repositioned     Hand Dominance Right   Extremity/Trunk Assessment Upper Extremity  Assessment Upper Extremity Assessment: Generalized weakness;LUE deficits/detail(R UE WFL ROM ) LUE Deficits / Details: Limited shoulder AROM due to pain with FF, approx 45 degrees before pain (distal WFL ) LUE: Unable to fully assess due to pain(pain in ribs limited functional motion)   Lower Extremity Assessment Lower Extremity Assessment: Defer to PT evaluation       Communication Communication Communication: HOH(hearing aide L )   Cognition Arousal/Alertness: Awake/alert Behavior During Therapy: WFL for tasks  assessed/performed Overall Cognitive Status: No family/caregiver present to determine baseline cognitive functioning Area of Impairment: Memory;Following commands;Problem solving                     Memory: Decreased short-term memory Following Commands: Follows one step commands with increased time     Problem Solving: Slow processing;Decreased initiation;Difficulty sequencing;Requires verbal cues;Requires tactile cues General Comments: patient overwhelmed by pain with any movement   General Comments       Exercises     Shoulder Instructions      Home Living Family/patient expects to be discharged to:: Private residence Living Arrangements: Children;Other relatives Available Help at Discharge: Family;Available 24 hours/day Type of Home: House Home Access: Ramped entrance     Home Layout: Multi-level;Able to live on main level with bedroom/bathroom     Bathroom Shower/Tub: Occupational psychologist: Handicapped height     Home Equipment: [place] - single point;Walker - 2 wheels;Shower seat          Prior Functioning/Environment Level of Independence: Independent with assistive device(s)        Comments: reports independent with ADL, IADL and uses SPC         OT Problem List: Decreased strength;Decreased activity tolerance;Impaired balance (sitting and/or standing);Decreased safety awareness;Decreased cognition;Pain;Impaired UE functional use      OT Treatment/Interventions: Self-care/ADL training;Therapeutic exercise;Energy conservation;DME and/or AE instruction;Therapeutic activities;Patient/family education;Balance training    OT Goals(Current goals can be found in the care plan section) Acute Rehab OT Goals Patient Stated Goal: to go home  OT Goal Formulation: With patient Time For Goal Achievement: 01/31/18 Potential to Achieve Goals: Fair  OT Frequency: Min 2X/week   Barriers to D/C:            Co-evaluation               AM-PAC PT "6 Clicks" Daily Activity     Outcome Measure Help from another person eating meals?: A Little Help from another person taking care of personal grooming?: A Little Help from another person toileting, which includes using toliet, bedpan, or urinal?: Total Help from another person bathing (including washing, rinsing, drying)?: A Lot Help from another person to put on and taking off regular upper body clothing?: A Lot Help from another person to put on and taking off regular lower body clothing?: Total 6 Click Score: 12   End of Session    Activity Tolerance: Patient limited by pain Patient left: in bed;with bed alarm set;with call bell/phone within reach  OT Visit Diagnosis: Other abnormalities of gait and mobility (R26.89);History of falling (Z91.81);Muscle weakness (generalized) (M62.81);Pain Pain - Right/Left: Left Pain - part of body: (trunk)                Time: 2297-9892 OT Time Calculation (min): 17 min Charges:  OT General Charges $OT Visit: 1 Visit OT Evaluation $OT Eval Moderate Complexity: 1 Mod G-Codes:     Delight Stare, OTR/L  Pager [place] 01/17/2018, 3:27  PM

## 2018-01-17 NOTE — Progress Notes (Signed)
PROGRESS NOTE    Micheal Frank  GYJ:[id number] DOB: [birth year] DOA: 01/16/2018 PCP: Colon Branch, MD      Brief Narrative:  Micheal Frank is a 84 y.o. M with hx CKD III, COPD not on O2, remote hx SDH, remote hx epidural abscess and chronic constipation who presented two days ago with fall and rib pain.  Found to have acute left 6-8 rib fractures, CT head normal.  Went home, but came back with intractable pain.  Repeat CXR showed no development of PNA, pneumothorax or effusion.        Assessment & Plan:  Multiple LEFT sided 6-7 rib fractures -Schedule acetaminophen -Continue topical NSAID only given CKD III -Add Robaxin -Continue hydromorphone, has been getting 0.5 mg IV q3hrs overnight -Add oxycodone 5 mg  -PT eval -IS  -Supplemental O2, wean as able   COPD No active disease -Continue home inhalers  Chronic kidney disease stage III Creatinine at baseline  BPH -Continue Flomax, doxazosin  Chronic constipation -Schedule MiraLAX, Senokot-S  Other medications -Continue statin -Continue bupropion -Continue ropinirole    DVT prophylaxis: Lovenox Code Status: FULL Family Communication: None present MDM and disposition Plan: The below labs and imaging reports were reviewed and summarized above.    The patient was admitted with multiple rib fractures, failed outpatient therapy.  He requires frequent doses of IV opiate, will need PT evaluation, possible placement.   Consultants:   None    Procedures:   None   Antimicrobials:   None    Subjective: Still with severe, sometimes flaring left back pain in the ribs. no new cough, fever, sputum, respiratory distress, confusion.  Objective: Vitals:   01/16/18 1945 01/16/18 2119 01/17/18 0423 01/17/18 0900  BP: 137/60  131/78   Pulse: 73  73   Resp: 13  15   Temp: 98.2 F (36.8 C)  (!) 97.4 F (36.3 C)   TempSrc: Oral  Oral   SpO2: 99% 99% 96% 98%  Weight:      Height:        Intake/Output Summary  (Last 24 hours) at 01/17/2018 1051 Last data filed at 01/17/2018 0500 Gross per 24 hour  Intake 240 ml  Output 250 ml  Net -10 ml   Filed Weights   01/16/18 1426  Weight: 67.1 kg (148 lb)    Examination: General appearance: Elderly adult male, alert and in no acute distress.   Lying in bed, interactive HEENT: Anicteric, conjunctiva pink, lids and lashes normal. No nasal deformity, discharge, epistaxis.  Lips moist, teeth normal.  Oropharynx very dry, no oral lesions, hearing diminished.   Skin: Warm and dry.  no jaundice.  No suspicious rashes or lesions. Cardiac: RRR, nl S1-S2, no murmurs appreciated.  Capillary refill is brisk.  JVP normal.  No LE edema.  Radia  pulses 2+ and symmetric. Respiratory: Normal respiratory rate and rhythm.  CTAB without rales or wheezes. Abdomen: Abdomen soft.  no TTP. No ascites, distension, hepatosplenomegaly.   MSK: No deformities or effusions. Neuro: Awake and alert.  EOMI, moves all extremities. Speech fluent.    Psych: Sensorium intact and responding to questions, attention normal. Affect normal.  Judgment and insight appear normal.    Data Reviewed: I have personally reviewed following labs and imaging studies:  CBC: Recent Labs  Lab 01/16/18 1556  WBC 12.5*  NEUTROABS 9.6*  HGB 15.2  HCT 48.3  MCV 97.6  PLT 263   Basic Metabolic Panel: Recent Labs  Lab 01/16/18 1556  NA  141  K 3.9  CL 104  CO2 24  GLUCOSE 75  BUN 15  CREATININE 1.46*  CALCIUM 8.7*   GFR: Estimated Creatinine Clearance: 31.9 mL/min (A) (by C-G formula based on SCr of 1.46 mg/dL (H)). Liver Function Tests: No results for input(s): AST, ALT, ALKPHOS, BILITOT, PROT, ALBUMIN in the last 168 hours. No results for input(s): LIPASE, AMYLASE in the last 168 hours. No results for input(s): AMMONIA in the last 168 hours. Coagulation Profile: No results for input(s): INR, PROTIME in the last 168 hours. Cardiac Enzymes: No results for input(s): CKTOTAL, CKMB,  CKMBINDEX, TROPONINI in the last 168 hours. BNP (last 3 results) No results for input(s): PROBNP in the last 8760 hours. HbA1C: No results for input(s): HGBA1C in the last 72 hours. CBG: No results for input(s): GLUCAP in the last 168 hours. Lipid Profile: No results for input(s): CHOL, HDL, LDLCALC, TRIG, CHOLHDL, LDLDIRECT in the last 72 hours. Thyroid Function Tests: No results for input(s): TSH, T4TOTAL, FREET4, T3FREE, THYROIDAB in the last 72 hours. Anemia Panel: No results for input(s): VITAMINB12, FOLATE, FERRITIN, TIBC, IRON, RETICCTPCT in the last 72 hours. Urine analysis:    Component Value Date/Time   COLORURINE YELLOW 07/23/2017 [place] 07/23/2017 1159   LABSPEC 1.020 07/23/2017 1159   PHURINE 7.0 07/23/2017 1159   GLUCOSEU NEGATIVE 07/23/2017 1159   HGBUR NEGATIVE 07/23/2017 1159   BILIRUBINUR NEGATIVE 07/23/2017 1159   KETONESUR NEGATIVE 07/23/2017 1159   PROTEINUR NEGATIVE 07/23/2017 1159   UROBILINOGEN 1.0 01/23/2013 0915   NITRITE NEGATIVE 07/23/2017 1159   LEUKOCYTESUR NEGATIVE 07/23/2017 1159   Sepsis Labs: @LABRCNTIP (procalcitonin:4,lacticacidven:4)  )No results found for this or any previous visit (from the past 240 hour(s)).       Radiology Studies: Dg Chest 2 View  Result Date: 01/16/2018 CLINICAL DATA:  Worsening rib pain since falling yesterday, LEFT side chest pain since EXAM: CHEST - 2 VIEW COMPARISON:  01/15/2018 FINDINGS: Normal heart size, mediastinal contours, and pulmonary vascularity. Atherosclerotic calcifications aorta. Bibasilar atelectasis greater on LEFT. Small LEFT pleural effusion. Remaining lungs clear. No pneumothorax. Bones demineralized. Fractures of the lateral fifth sixth and seventh ribs identified. Degenerative changes of the thoracic spine. IMPRESSION: Fractures of the lateral LEFT fifth sixth and seventh ribs with associated basilar atelectasis and small LEFT pleural effusion. Electronically Signed   By: Lavonia Dana M.D.   On: 01/16/2018 16:49   Dg Ribs Unilateral W/chest Left  Result Date: 01/15/2018 CLINICAL DATA:  LEFT chest and rib pain following fall. Initial encounter. EXAM: LEFT RIBS AND CHEST - 3+ VIEW COMPARISON:  None. FINDINGS: Acute fractures of the LEFT 6th, 7th and 8th ribs noted. No pleural effusion or pneumothorax. Cardiomediastinal silhouette is unchanged. No airspace disease or pleural effusion. No other acute abnormalities identified. IMPRESSION: Acute LEFT 6th, 7th and 8th rib fractures. No pleural effusion or pneumothorax. Electronically Signed   By: Margarette Canada M.D.   On: 01/15/2018 19:17   Ct Head Wo Contrast  Result Date: 01/15/2018 CLINICAL DATA:  84 year old male post fall. Denies loss of consciousness. Does not think he hit his head. Initial encounter. EXAM: CT HEAD WITHOUT CONTRAST TECHNIQUE: Contiguous axial images were obtained from the base of the skull through the vertex without intravenous contrast. COMPARISON:  01/16/2013 CT. FINDINGS: Brain: Remote small right frontal subdural collection slightly smaller than on prior exam now with maximal thickness of 3 mm versus prior 4 mm. Slightly asymmetric prominent extra-axial spaces larger on the right supratentorially and  symmetric infratentorially may reflect result of atrophy versus subdural hygromas. Overall, no acute intracranial hemorrhage or CT evidence of large acute infarct. Remote basal ganglia/corona radiata infarcts and chronic microvascular changes. Moderate to marked global atrophy. No intracranial mass lesion noted on this unenhanced exam. Vascular: Vascular calcifications Skull: No skull fracture. Sinuses/Orbits: No acute orbital abnormality. Visualized paranasal sinuses are clear. Other: Prior right mastoidectomy. Temporomandibular joint degenerative changes. IMPRESSION: No skull fracture or acute intracranial hemorrhage. Chronic changes as detailed above. Electronically Signed   By: Genia Del M.D.   On: 01/15/2018  19:06   Dg Knee Complete 4 Views Left  Result Date: 01/15/2018 CLINICAL DATA:  Knee pain after fall EXAM: LEFT KNEE - COMPLETE 4+ VIEW COMPARISON:  06/26/2017 FINDINGS: Status post left knee replacement with normal alignment. Suspected interval revision of the prosthesis. No definite acute displaced fracture is seen. Similar appearance of cortical bone thickening at the distal femur cortical bone thickening at the distal femur is not significantly changed. Suspected knee effusion. Dense vascular calcifications. IMPRESSION: Status post left knee replacement without definite acute osseous abnormality. Suspected knee effusion. Electronically Signed   By: Donavan Foil M.D.   On: 01/15/2018 19:21        Scheduled Meds: . acetaminophen  1,000 mg Oral TID  . bisacodyl  5 mg Oral Daily  . buPROPion  200 mg Oral BID  . calcium carbonate  1,250 mg Oral Q supper  . diclofenac  1 patch Transdermal BID  . doxazosin  4 mg Oral QHS  . enoxaparin (LOVENOX) injection  30 mg Subcutaneous Q24H  . famotidine  20 mg Oral Daily  . fluticasone  2 spray Each Nare BID  . mometasone-formoterol  2 puff Inhalation BID  . montelukast  10 mg Oral Daily  . multivitamin with minerals  1 tablet Oral Daily  . polyethylene glycol  17 g Oral Daily  . rOPINIRole  1 mg Oral BID  . senna-docusate  1 tablet Oral BID  . simvastatin  40 mg Oral q1800  . tamsulosin  0.4 mg Oral Daily  . vitamin C  500 mg Oral Daily   Continuous Infusions: . methocarbamol (ROBAXIN)  IV       LOS: 0 days    Time spent: 25 minutes    Edwin Dada, MD [facility] 01/17/2018, 10:51 AM     Pager [phone] --- please page though AMION:  [email or web address] If 7PM-7AM, please contact night-coverage

## 2018-01-18 DIAGNOSIS — J9621 Acute and chronic respiratory failure with hypoxia: Secondary | ICD-10-CM

## 2018-01-18 MED ORDER — ONDANSETRON HCL 4 MG/2ML IJ SOLN
4.0000 mg | Freq: Three times a day (TID) | INTRAMUSCULAR | Status: DC | PRN
  Administered 2018-01-18 – 2018-01-20 (×2): 4 mg via INTRAVENOUS
  Filled 2018-01-18 (×3): qty 2

## 2018-01-18 NOTE — Social Work (Addendum)
CSW f/u on disposition.  CSW called admission staff April at [facility] and left message to see if they can offer SNF bed. CSW contacted admission staff at [facility] and left message. CSW f/u for SNF offers.  1:35pm April at [facility] indicated that they can not offer SNF bed but will f/u on Monday.    CSW still f/u.  Elissa Hefty, LCSW Clinical Social Worker [phone]

## 2018-01-18 NOTE — [place] FL2 (Signed)
[place] MEDICAID FL2 LEVEL OF CARE SCREENING TOOL     IDENTIFICATION  Patient Name: Micheal Frank Birthdate: 05-Jul-1926 Sex: male Admission Date (Current Location): 01/16/2018  [facility] and [place] Number:  Herbalist and Address:  [facility] [place] [place] [address] [place], [place] [address]      Provider Number: [id number]  Attending Physician Name and Address:  Edwin Dada, *  Relative Name and Phone Number:  Herbert Deaner, daughter, [phone]    Current Level of Care: Hospital Recommended Level of Care: [place] Prior Approval Number:    Date Approved/Denied:   PASRR Number: [id number]  Discharge Plan: SNF    Current Diagnoses: Patient Active Problem List   Diagnosis Date Noted  . Multiple rib fractures 01/17/2018  . Fall 01/16/2018  . Essential hypertension 01/16/2018  . BPH (benign prostatic hyperplasia) 01/16/2018  . Multiple closed fractures of ribs of left side   . CAP (community acquired pneumonia) 06/25/2017  . UTI (urinary tract infection) 06/24/2017  . Sepsis ([place]) 06/24/2017  . Compression fracture of T12 vertebra ([place]) 06/24/2017  . Generalized weakness 06/24/2017  . Presence of left artificial knee joint 07/24/2016  . Chronic pain of left knee 07/24/2016  . Chronic midline low back pain without sciatica 07/24/2016  . Depression 07/09/2016  . C. difficile diarrhea 05/04/2016  . Chronic cough 08/16/2015  . PCP NOTES >>>>>> 04/06/2015  . CTS (carpal tunnel syndrome) 11/16/2014  . Hyperlipidemia 08/17/2014  . Superficial bruising of chest wall 07/29/2014  . Urgency incontinence 01/23/2013  . Chronic kidney disease, stage III (moderate) ([place]) 01/20/2013  . At high risk for falls 11/28/2012  . ALLERGIC RHINITIS 12/09/2009  . Carcinoma in situ of skin 04/30/2008  . RESTLESS LEG SYNDROME 04/30/2008  . NEUROPATHY 04/30/2008  . GLAUCOMA, BORDERLINE 04/30/2008  . Meniere's disease 04/30/2008   . HEARING LOSS 04/30/2008  . COPD GOLD II  04/30/2008  . DJD (degenerative joint disease) 04/30/2008  . Osteoporosis 04/30/2008  . Sleep apnea 04/30/2008  . HISTORY OF ASBESTOS EXPOSURE 04/30/2008    Orientation RESPIRATION BLADDER Height & Weight     Self, Time, Situation, Place  O2(Nasal Cannula 2L) Continent Weight: 148 lb (67.1 kg) Height:  5' 9.5" (176.5 cm)  BEHAVIORAL SYMPTOMS/MOOD NEUROLOGICAL BOWEL NUTRITION STATUS      Continent Diet(See DC Summary)  AMBULATORY STATUS COMMUNICATION OF NEEDS Skin   Extensive Assist Verbally Bruising(Rib Fractures)                       Personal Care Assistance Level of Assistance  Dressing, Bathing, Feeding Bathing Assistance: Maximum assistance Feeding assistance: Limited assistance Dressing Assistance: Maximum assistance     Functional Limitations Info  Sight, Hearing, Speech Sight Info: Adequate Hearing Info: Impaired Speech Info: Adequate    SPECIAL CARE FACTORS FREQUENCY  PT (By licensed PT), OT (By licensed OT)     PT Frequency: 3x week OT Frequency: 2x week            Contractures      Additional Factors Info  Code Status, Allergies, Psychotropic Code Status Info: Full Code Allergies Info: ANTICOAGULANT COMPOUND, PROTONIX PANTOPRAZOLE SODIUM  Psychotropic Info: welbutrin         Current Medications (01/18/2018):  This is the current hospital active medication list Current Facility-Administered Medications  Medication Dose Route Frequency Provider Last Rate Last Dose  . acetaminophen (TYLENOL) tablet 1,000 mg  1,000 mg Oral TID Edwin Dada,  MD   1,000 mg at 01/18/18 0939  . albuterol (PROVENTIL) (2.5 MG/3ML) 0.083% nebulizer solution 2.5 mg  2.5 mg Nebulization Q6H PRN Georgette Shell, MD      . benzonatate (TESSALON) capsule 200 mg  200 mg Oral BID PRN Georgette Shell, MD      . bisacodyl (DULCOLAX) EC tablet 5 mg  5 mg Oral Daily Georgette Shell, MD   5 mg at 01/18/18 2993  .  buPROPion (WELLBUTRIN SR) 12 hr tablet 200 mg  200 mg Oral BID Georgette Shell, MD   200 mg at 01/18/18 7169  . calcium carbonate (OS-CAL - dosed in mg of elemental calcium) tablet 1,250 mg  1,250 mg Oral Q supper Georgette Shell, MD      . diclofenac (FLECTOR) 1.3 % 1 patch  1 patch Transdermal BID Carmin Muskrat, MD   1 patch at 01/18/18 1114  . doxazosin (CARDURA) tablet 4 mg  4 mg Oral QHS Georgette Shell, MD   4 mg at 01/17/18 2257  . enoxaparin (LOVENOX) injection 30 mg  30 mg Subcutaneous Q24H Georgette Shell, MD   30 mg at 01/17/18 2253  . famotidine (PEPCID) tablet 20 mg  20 mg Oral Daily Georgette Shell, MD   20 mg at 01/18/18 6789  . fluticasone (FLONASE) 50 MCG/ACT nasal spray 2 spray  2 spray Each Nare BID Georgette Shell, MD   2 spray at 01/18/18 3810  . hydrALAZINE (APRESOLINE) injection 10 mg  10 mg Intravenous Q6H PRN Georgette Shell, MD      . HYDROmorphone (DILAUDID) injection 0.5 mg  0.5 mg Intravenous Q3H PRN Georgette Shell, MD   0.5 mg at 01/17/18 0754  . meclizine (ANTIVERT) tablet 25 mg  25 mg Oral Daily PRN Georgette Shell, MD      . methocarbamol (ROBAXIN) 500 mg in dextrose 5 % 50 mL IVPB  500 mg Intravenous Q8H PRN Danford, Suann Larry, MD      . mometasone-formoterol (DULERA) 200-5 MCG/ACT inhaler 2 puff  2 puff Inhalation BID Georgette Shell, MD   2 puff at 01/18/18 [phone]  . montelukast (SINGULAIR) tablet 10 mg  10 mg Oral Daily Georgette Shell, MD   10 mg at 01/18/18 0258  . multivitamin with minerals tablet 1 tablet  1 tablet Oral Daily Georgette Shell, MD   1 tablet at 01/18/18 [phone]  . ondansetron (ZOFRAN) injection 4 mg  4 mg Intravenous Q8H PRN Danford, Suann Larry, MD   4 mg at 01/18/18 1140  . oxyCODONE (Oxy IR/ROXICODONE) immediate release tablet 5 mg  5 mg Oral Q4H PRN Edwin Dada, MD   5 mg at 01/18/18 0939  . polyethylene glycol (MIRALAX / GLYCOLAX) packet 17 g  17 g Oral Daily  Edwin Dada, MD   17 g at 01/18/18 8242  . rOPINIRole (REQUIP) tablet 1 mg  1 mg Oral BID Georgette Shell, MD   1 mg at 01/18/18 3536  . senna-docusate (Senokot-S) tablet 1 tablet  1 tablet Oral BID Edwin Dada, MD   1 tablet at 01/18/18 0939  . simvastatin (ZOCOR) tablet 40 mg  40 mg Oral q1800 Georgette Shell, MD   40 mg at 01/17/18 1713  . tamsulosin (FLOMAX) capsule 0.4 mg  0.4 mg Oral Daily Georgette Shell, MD   0.4 mg at 01/18/18 1443  . vitamin C (ASCORBIC ACID) tablet 500 mg  500 mg  Oral Daily Georgette Shell, MD   500 mg at 01/18/18 9536     Discharge Medications: Please see discharge summary for a list of discharge medications.  Relevant Imaging Results:  Relevant Lab Results:   Additional Information SS#: [id number] [place], LCSW

## 2018-01-18 NOTE — Consult Note (Signed)
[place] Nurse wound consult note Reason for Consult:Deep tissue injury to buttocks. Nonintact partial thickness skin loss to left gluteal.  Trauma from fall Wound type:trauma Pressure Injury POA: Yes Measurement: Bilateral gluteal folds and sacrum:  16 cm x 4 cm intact maroon discoloration around perimeter with erythema to inner aspect.   2- 0.4 cm nonintact abrasions to left gluteal area Wound YJG:ZQJS and moist Drainage (amount, consistency, odor) scant serosanguinous  Periwound: intact Dressing procedure/placement/frequency:Silicone foam dressing to nonintact lesions on left gluteal area.  Change every three days and PRN soilage.  Offload pressure to buttocks and sacrum by turning and repositioning every two hours.  Will not follow at this time.  Please re-consult if needed.  Domenic Moras RN BSN [place] Pager [phone]

## 2018-01-18 NOTE — Progress Notes (Signed)
PROGRESS NOTE    SHYLO DILLENBECK  CWC:[id number] DOB: [birth year] DOA: 01/16/2018 PCP: Colon Branch, MD      Brief Narrative:  Mr. Erker is a 84 y.o. M with hx CKD III, COPD not on O2, remote hx SDH, remote hx epidural abscess and chronic constipation who presented two days ago with fall and rib pain.  Found to have acute left 6-8 rib fractures, CT head normal.  Went home, but came back with intractable pain.  Repeat CXR showed no development of PNA, pneumothorax or effusion.        Assessment & Plan:  Multiple LEFT sided 6-7 rib fractures -Continue scheduled acetaminophen -Continue topical NSAID given CKD -Continue Robaxin -Continue oxycodone -Continue aggressive incentive spirometry, wean oxygen as able   Sleep apnea Patient had been on CPAP years ago, didn't tolerate it, has been off.  Now OSA being aggravated by opiates. -CPAP at night and with naps  COPD No active disease -Continue home inhalers  Chronic kidney disease stage III Creatinine at baseline  BPH -Continue Flomax, doxazosin  Chronic constipation -Continue MiraLAX, Senokot-S  Other medications -Continue statin -Continue bupropion -Continue Requip        DVT prophylaxis: Lovenox Code Status: FULL Family Communication: None present MDM and disposition Plan: The below labs and imaging reports were reviewed and summarized above.  The patient was admitted with multiple rib fracture, and in the context of his underlying COPD, untreated sleep apnea, and failure of outpatient therapy, he has been high risk for complications.  He has been evaluated by physical therapy, and is unable to walk or toilet himself.  Will require SNF treatment, insurance authorization is pending, while we wean oxygen, continue incentive spirometry and aggressive pain control.     Consultants:   None    Procedures:   None   Antimicrobials:   None    Subjective: Sleepy today, pain in the back in the ribs.  No new  fever, cough, sputum, respiratory distress, confusion.  No dysuria, urinary frequency.      Objective: Vitals:   01/18/18 0838 01/18/18 0937 01/18/18 1419 01/18/18 1553  BP:   (!) 112/56   Pulse: 76  72   Resp: 16  16   Temp:      TempSrc:      SpO2: 94% 93% (!) 88% 91%  Weight:      Height:        Intake/Output Summary (Last 24 hours) at 01/18/2018 1647 Last data filed at 01/18/2018 1420 Gross per 24 hour  Intake 480 ml  Output 520 ml  Net -40 ml   Filed Weights   01/16/18 1426  Weight: 67.1 kg (148 lb)    Examination: General appearance: Elderly adult male, lying in bed, pain with any movement.  Interactive. HEENT: Anicteric, conjunctival pink, lids and lashes normal.  No nasal deformity, discharge, or epistaxis.  Lips moist, dentures in place.  Oropharynx very dry, no oral lesions, hearing diminished. Skin: Warm and dry, no jaundice, no suspicious rashes or lesions. Cardiac: Regular rate and rhythm, no murmurs, no lower extremity edema. Respiratory: Respiratory effort shallow, lungs clear without rales or wheezes. Abdomen: Abdomen soft without tenderness to palpation, ascites, distention, or hepatospleno megaly. MSK: No muscular deformities, effusions.  In the large joints of the upper lower extremities bilaterally. Neuro: Sleeping but arouses easily.  Extraocular movements intact.  Moves all extremities with equal strength, global weakness, very weak.  Speech fluent.     Psych: Sensorium intact and  responding to questions, attention normal, affect normal, judgment and insight appear normal.    Data Reviewed: I have personally reviewed following labs and imaging studies:  CBC: Recent Labs  Lab 01/16/18 1556  WBC 12.5*  NEUTROABS 9.6*  HGB 15.2  HCT 48.3  MCV 97.6  PLT 920   Basic Metabolic Panel: Recent Labs  Lab 01/16/18 1556  NA 141  K 3.9  CL 104  CO2 24  GLUCOSE 75  BUN 15  CREATININE 1.46*  CALCIUM 8.7*   GFR: Estimated Creatinine Clearance: 31.9  mL/min (A) (by C-G formula based on SCr of 1.46 mg/dL (H)). Liver Function Tests: No results for input(s): AST, ALT, ALKPHOS, BILITOT, PROT, ALBUMIN in the last 168 hours. No results for input(s): LIPASE, AMYLASE in the last 168 hours. No results for input(s): AMMONIA in the last 168 hours. Coagulation Profile: No results for input(s): INR, PROTIME in the last 168 hours. Cardiac Enzymes: No results for input(s): CKTOTAL, CKMB, CKMBINDEX, TROPONINI in the last 168 hours. BNP (last 3 results) No results for input(s): PROBNP in the last 8760 hours. HbA1C: No results for input(s): HGBA1C in the last 72 hours. CBG: No results for input(s): GLUCAP in the last 168 hours. Lipid Profile: No results for input(s): CHOL, HDL, LDLCALC, TRIG, CHOLHDL, LDLDIRECT in the last 72 hours. Thyroid Function Tests: No results for input(s): TSH, T4TOTAL, FREET4, T3FREE, THYROIDAB in the last 72 hours. Anemia Panel: No results for input(s): VITAMINB12, FOLATE, FERRITIN, TIBC, IRON, RETICCTPCT in the last 72 hours. Urine analysis:    Component Value Date/Time   COLORURINE YELLOW 07/23/2017 [place] 07/23/2017 1159   LABSPEC 1.020 07/23/2017 1159   PHURINE 7.0 07/23/2017 1159   GLUCOSEU NEGATIVE 07/23/2017 1159   HGBUR NEGATIVE 07/23/2017 1159   BILIRUBINUR NEGATIVE 07/23/2017 1159   KETONESUR NEGATIVE 07/23/2017 1159   PROTEINUR NEGATIVE 07/23/2017 1159   UROBILINOGEN 1.0 01/23/2013 0915   NITRITE NEGATIVE 07/23/2017 1159   LEUKOCYTESUR NEGATIVE 07/23/2017 1159   Sepsis Labs: @LABRCNTIP (procalcitonin:4,lacticacidven:4)  )No results found for this or any previous visit (from the past 240 hour(s)).       Radiology Studies: No results found.      Scheduled Meds: . acetaminophen  1,000 mg Oral TID  . bisacodyl  5 mg Oral Daily  . buPROPion  200 mg Oral BID  . calcium carbonate  1,250 mg Oral Q supper  . diclofenac  1 patch Transdermal BID  . doxazosin  4 mg Oral QHS  .  enoxaparin (LOVENOX) injection  30 mg Subcutaneous Q24H  . famotidine  20 mg Oral Daily  . fluticasone  2 spray Each Nare BID  . mometasone-formoterol  2 puff Inhalation BID  . montelukast  10 mg Oral Daily  . multivitamin with minerals  1 tablet Oral Daily  . polyethylene glycol  17 g Oral Daily  . rOPINIRole  1 mg Oral BID  . senna-docusate  1 tablet Oral BID  . simvastatin  40 mg Oral q1800  . tamsulosin  0.4 mg Oral Daily  . vitamin C  500 mg Oral Daily   Continuous Infusions: . methocarbamol (ROBAXIN)  IV       LOS: 1 day    Time spent: 25 minutes    Edwin Dada, MD [facility] 01/18/2018, 4:47 PM     Pager [phone] --- please page though AMION:  [email or web address] Password [email or web address] If 7PM-7AM, please contact night-coverage

## 2018-01-18 NOTE — Clinical Social Work Note (Signed)
Clinical Social Work Assessment  Patient Details  Name: Micheal Frank MRN: [id number] Date of Birth: Jul 16, 1926  Date of referral:  01/18/18               Reason for consult:  Facility Placement                Permission sought to share information with:  Chartered certified accountant granted to share information::  No  Name::        Agency::     Relationship::     Contact Information:     Housing/Transportation Living arrangements for the past 2 months:  Single Family Home Source of Information:  Patient, Adult Children Patient Interpreter Needed:  None Criminal Activity/Legal Involvement Pertinent to Current Situation/Hospitalization:  No - Comment as needed Significant Relationships:  Adult Children, Other Family Members Lives with:  Adult Children, Relatives Do you feel safe going back to the place where you live?  No Need for family participation in patient care:  Yes (Comment)  Care giving concerns:  CSW met with patient and daughter at bedside to discuss disposition. Pt/family DECLINED SNF and will take patient home. Family indicated that they have used [facility] in the past. CSw will defer to [facility].  Social Worker assessment / plan:  Pt declined SNF and will return home.  Employment status:  Retired Nurse, adult PT Recommendations:  [place] / Referral to community resources:  [place]  Patient/Family's Response to care:  Contractor of CSW assistance.  Patient/Family's Understanding of and Emotional Response to Diagnosis, Current Treatment, and Prognosis:  Pt/family has good emotional support and indicated that they can care for patient at home. No CSW needs. RNCM will assist with home needs.  Emotional Assessment Appearance:  Appears stated age Attitude/Demeanor/Rapport:  (Cooperative) Affect (typically observed):  Accepting, Appropriate Orientation:  Oriented to Self,  Oriented to Place, Oriented to  Time, Oriented to Situation Alcohol / Substance use:  Not Applicable Psych involvement (Current and /or in the community):  No (Comment)  Discharge Needs  Concerns to be addressed:  Discharge Planning Concerns Readmission within the last 30 days:  No Current discharge risk:  Dependent with Mobility Barriers to Discharge:  No Barriers Identified   Micheal Baxter, LCSW 01/18/2018, 11:10 AM

## 2018-01-18 NOTE — Care Management Note (Addendum)
Case Management Note  Patient Details  Name: Micheal Frank MRN: [id number] Date of Birth: [birth year]  Subjective/Objective:   84 yr old gentleman admitted after a fall at home. Patient has fractures of left 6,7, andth ribs, non operative.   Action/Plan: Case manager spoke with patient and his daughter [name] concerning discharge plan and DME needs. Choice for Home Health agency was offered, [name] says that her Dad uses [facility] and prefers therapist by the name of Elta Guadeloupe. Case manager called referral to Joen Laura, Liaison with [facility]. Patient lives with his daughter [name] and family, home is handicap accessible.   Expected Discharge Date:    pending              Expected Discharge Plan:  [place]  In-House Referral:  NA, Clinical Social Work  Discharge planning Services  CM Consult  Post Acute Care Choice:  Home Health Choice offered to:  Patient, Adult Children  DME Arranged:  N/A(has RW, cane , hospital bed) DME Agency:  NA  HH Arranged:  PT, Social Work [employer] Agency:  [facility] [employer]  Status of Service:  Completed, signed off  If discussed at [employer], dates discussed:    Additional Comments:Case manager and Dr. Loleta Books spoke with patient and his daughter [name] concerning discharge plan. Patient says he understands he should go to rehab but he feels lonely there. Case manager and Dr. Loleta Books expressed understanding. Patient said that he is willing to go to SNF for shortterm rehab, wants to go to [facility] or [place]. Case manager relayed this information to Education officer, museum.    Ninfa Meeker, RN 01/18/2018, 11:56 AM

## 2018-01-18 NOTE — Plan of Care (Signed)
  Problem: Activity: Goal: Risk for activity intolerance will decrease Outcome: Progressing   Problem: Pain Managment: Goal: General experience of comfort will improve Outcome: Progressing   Problem: Safety: Goal: Ability to remain free from injury will improve Outcome: Progressing   

## 2018-01-18 NOTE — Progress Notes (Signed)
Physical Therapy Treatment Patient Details Name: Micheal Frank MRN: [id number] DOB: 09-13-1925 Today's Date: 01/18/2018    History of Present Illness Pt is a 84 y/o male admitted after falling at home in which he sustained multiple L rib fractures. PMH including but not limited to CKD, COPD, HLD, L TKA in 2003 with revision in 2012.    PT Comments    Pt making slow progress with functional mobility and remains very limited secondary to pain. Pt required max A x2 for bed mobility and min A x2 for transfers with RW. Pt would continue to benefit from skilled physical therapy services at this time while admitted and after d/c to address the below listed limitations in order to improve overall safety and independence with functional mobility.    Follow Up Recommendations  SNF(if pt/family refuses, pt will need HHPT, HHOT and 24/7 physical assistance)     Equipment Recommendations  None recommended by PT    Recommendations for Other Services       Precautions / Restrictions Precautions Precautions: Fall Restrictions Weight Bearing Restrictions: No    Mobility  Bed Mobility Overal bed mobility: Needs Assistance Bed Mobility: Supine to Sit     Supine to sit: +2 for physical assistance;Max assist     General bed mobility comments: increased time and effort, pt able to initiate bilateral LE movement towards EOB, use of bed pad to achieve full upright sitting  Transfers Overall transfer level: Needs assistance Equipment used: Rolling walker (2 wheeled) Transfers: Sit to/from [facility] Sit to Stand: Min assist;+2 safety/equipment Stand pivot transfers: Min assist;+2 safety/equipment       General transfer comment: elevated bed position, assist for stability with rise into standing and with pivotal movements to chair  Ambulation/Gait                 Stairs             Wheelchair Mobility    Modified Rankin (Stroke Patients Only)        Balance Overall balance assessment: History of Falls;Needs assistance Sitting-balance support: Feet supported Sitting balance-Leahy Scale: Fair     Standing balance support: During functional activity;Bilateral upper extremity supported Standing balance-Leahy Scale: Poor                              Cognition Arousal/Alertness: Awake/alert Behavior During Therapy: WFL for tasks assessed/performed Overall Cognitive Status: No family/caregiver present to determine baseline cognitive functioning Area of Impairment: Memory;Following commands;Problem solving                     Memory: Decreased short-term memory Following Commands: Follows one step commands with increased time     Problem Solving: Slow processing;Decreased initiation;Difficulty sequencing;Requires verbal cues;Requires tactile cues        Exercises      General Comments        Pertinent Vitals/Pain Pain Assessment: Faces Faces Pain Scale: Hurts whole lot Pain Location: L side of trunk Pain Descriptors / Indicators: Sore;Grimacing;Guarding;Moaning Pain Intervention(s): Monitored during session;Repositioned    Home Living                      Prior Function            PT Goals (current goals can now be found in the care plan section) Acute Rehab PT Goals PT Goal Formulation: With patient Time For Goal Achievement: 01/31/18  Potential to Achieve Goals: Fair Progress towards PT goals: Progressing toward goals    Frequency    Min 3X/week      PT Plan Current plan remains appropriate    Co-evaluation              AM-PAC PT "6 Clicks" Daily Activity  Outcome Measure  Difficulty turning over in bed (including adjusting bedclothes, sheets and blankets)?: Unable Difficulty moving from lying on back to sitting on the side of the bed? : Unable Difficulty sitting down on and standing up from a chair with arms (e.g., wheelchair, bedside commode, etc,.)?:  Unable Help needed moving to and from a bed to chair (including a wheelchair)?: A Little Help needed walking in hospital room?: A Lot Help needed climbing 3-5 steps with a railing? : Total 6 Click Score: 9    End of Session Equipment Utilized During Treatment: Gait belt Activity Tolerance: Patient limited by pain Patient left: in chair;with call bell/phone within reach;with chair alarm set;with family/visitor present Nurse Communication: Mobility status PT Visit Diagnosis: Other abnormalities of gait and mobility (R26.89);Pain Pain - Right/Left: Right Pain - part of body: Knee(and L trunk)     Time: 0937-1000 PT Time Calculation (min) (ACUTE ONLY): 23 min  Charges:  $Therapeutic Activity: 23-37 mins                    G Codes:       [place], PT, DPT [place] 01/18/2018, 11:44 AM

## 2018-01-18 NOTE — Social Work (Signed)
CSW discussed case with Dr. Loleta Books and [facility].  Family/pt agrees to SNF at this time.  They are interested in [facility] or [facility].  CSW will send referrals and f/u as Insurance Auth will be needed.  Elissa Hefty, LCSW Clinical Social Worker [phone]

## 2018-01-19 LAB — CBC
HEMATOCRIT: 41.6 % (ref 39.0–52.0)
Hemoglobin: 13.2 g/dL (ref 13.0–17.0)
MCH: 31 pg (ref 26.0–34.0)
MCHC: 31.7 g/dL (ref 30.0–36.0)
MCV: 97.7 fL (ref 78.0–100.0)
Platelets: 201 10*3/uL (ref 150–400)
RBC: 4.26 MIL/uL (ref 4.22–5.81)
RDW: 14.1 % (ref 11.5–15.5)
WBC: 19.5 10*3/uL — AB (ref 4.0–10.5)

## 2018-01-19 NOTE — Progress Notes (Addendum)
PROGRESS NOTE    Micheal Frank  ZOX:[id number] DOB: 1926-05-13 DOA: 01/16/2018 PCP: Colon Branch, MD      Brief Narrative:  Micheal Frank is a 84 y.o. M with hx CKD III, COPD not on O2, remote hx SDH, remote hx epidural abscess and chronic constipation who presented two days ago with fall and rib pain.  Found to have acute left 6-8 rib fractures, CT head normal.  Went home, but came back with intractable pain.  Repeat CXR showed no development of PNA, pneumothorax or effusion.        Assessment & Plan:  Multiple LEFT sided 6-7 rib fractures -Continue sched acetaminophen -Continue topical NSAID given CKD -Continue Robaxin, oxycodone -Continue aggressive IS, wean O2 as able -Ambulate in hall   Sleep apnea Patient had been on CPAP years ago, didn't tolerate it, has been off.  Now OSA being aggravated by opiates. -Continue CPAP at night and with naps  COPD No active disease, wheezing -Continue inhalers  Chronic kidney disease stage III Creatinine at baseline  BPH -Continue Flomax, doxazosin  Chronic constipation -Continue MiraLAX, Senokot-S  Other medications -Continue statin, buprofpion, Requip  Leukocytosis Worsening.  No clinical signs of infection however.  Presumably reactive.  No fever. -Trend CBC     DVT prophylaxis: Lovenox Code Status: FULL Family Communication: Daughter MDM and disposition Plan:  The below labs and imaging reports were reviewed and symmarized above.  Patient was admitted with multiple rib fractures, in the context of advanced age, underlying COPD, untreated sleep apnea, and failure of outpatient therapy.  He is high risk therefore for complications.  He has been evaluated by physical therapy, assist for bed mobility, transfers and has minimal ambulatory capacity.  They recommended SNF.  Will require SNF rehab, insurance authorization is pending, while we wean oxygen, continue incentive spirometry and aggressive pain control.          Consultants:   None    Procedures:   None   Antimicrobials:   None    Subjective: Rib pain with breathing, or coughing.  No fever, cough, sputum, respiratory distress, confusion.  No dysuria or urinary frequency.    Objective: Vitals:   01/18/18 2304 01/19/18 0519 01/19/18 0900 01/19/18 0940  BP:  121/60    Pulse:  75    Resp:      Temp:  (!) 97.5 F (36.4 C)    TempSrc:  Axillary    SpO2: 90% 92% 90% 92%  Weight:      Height:        Intake/Output Summary (Last 24 hours) at 01/19/2018 1051 Last data filed at 01/18/2018 1700 Gross per 24 hour  Intake 240 ml  Output 200 ml  Net 40 ml   Filed Weights   01/16/18 1426  Weight: 67.1 kg (148 lb)    Examination: General appearance: Elderly male, hard of hearing, lying in bed, pain with movement, interactive. HEENT: Anicteric, conjunctival pink, lids and lashes normal.  No nasal deformity, discharge, or epistaxis.  Lips moist, dentures in place.  Oropharynx very dry, no oral lesions, hearing diminished. Skin: Warm and dry, no jaundice, no suspicious rashes or lesions. Cardiac: Regular rate and rhythm, no murmurs, no lower extremity edema. Respiratory: Respiratory effort is shallow, lungs are clear without rales or wheezes. Abdomen: Abdomen is soft without tenderness to palpation, ascites, or distention. MSK: No muscular deformities or effusions in the large joints of the upper or lower extremities bilaterally. Neuro: Extraocular movements intact, left pupil larger  than right, fixed, chronic.  Speech fluent.  Moves upper extremities and lower extremities with global weakness, but normal ordination, and equal symmetric weakness.     Psych: Sensorium intact and responding to questions, attention normal, affect blunted, judgment and insight appear poor due to dementia.    Data Reviewed: I have personally reviewed following labs and imaging studies:  CBC: Recent Labs  Lab 01/16/18 1556 01/19/18 0640  WBC 12.5*  19.5*  NEUTROABS 9.6*  --   HGB 15.2 13.2  HCT 48.3 41.6  MCV 97.6 97.7  PLT 251 867   Basic Metabolic Panel: Recent Labs  Lab 01/16/18 1556  NA 141  K 3.9  CL 104  CO2 24  GLUCOSE 75  BUN 15  CREATININE 1.46*  CALCIUM 8.7*   GFR: Estimated Creatinine Clearance: 31.9 mL/min (A) (by C-G formula based on SCr of 1.46 mg/dL (H)). Liver Function Tests: No results for input(s): AST, ALT, ALKPHOS, BILITOT, PROT, ALBUMIN in the last 168 hours. No results for input(s): LIPASE, AMYLASE in the last 168 hours. No results for input(s): AMMONIA in the last 168 hours. Coagulation Profile: No results for input(s): INR, PROTIME in the last 168 hours. Cardiac Enzymes: No results for input(s): CKTOTAL, CKMB, CKMBINDEX, TROPONINI in the last 168 hours. BNP (last 3 results) No results for input(s): PROBNP in the last 8760 hours. HbA1C: No results for input(s): HGBA1C in the last 72 hours. CBG: No results for input(s): GLUCAP in the last 168 hours. Lipid Profile: No results for input(s): CHOL, HDL, LDLCALC, TRIG, CHOLHDL, LDLDIRECT in the last 72 hours. Thyroid Function Tests: No results for input(s): TSH, T4TOTAL, FREET4, T3FREE, THYROIDAB in the last 72 hours. Anemia Panel: No results for input(s): VITAMINB12, FOLATE, FERRITIN, TIBC, IRON, RETICCTPCT in the last 72 hours. Urine analysis:    Component Value Date/Time   COLORURINE YELLOW 07/23/2017 [place] 07/23/2017 1159   LABSPEC 1.020 07/23/2017 1159   PHURINE 7.0 07/23/2017 1159   GLUCOSEU NEGATIVE 07/23/2017 1159   HGBUR NEGATIVE 07/23/2017 1159   BILIRUBINUR NEGATIVE 07/23/2017 1159   KETONESUR NEGATIVE 07/23/2017 1159   PROTEINUR NEGATIVE 07/23/2017 1159   UROBILINOGEN 1.0 01/23/2013 0915   NITRITE NEGATIVE 07/23/2017 1159   LEUKOCYTESUR NEGATIVE 07/23/2017 1159   Sepsis Labs: @LABRCNTIP (procalcitonin:4,lacticacidven:4)  )No results found for this or any previous visit (from the past 240 hour(s)).        Radiology Studies: No results found.      Scheduled Meds: . acetaminophen  1,000 mg Oral TID  . bisacodyl  5 mg Oral Daily  . buPROPion  200 mg Oral BID  . calcium carbonate  1,250 mg Oral Q supper  . diclofenac  1 patch Transdermal BID  . doxazosin  4 mg Oral QHS  . enoxaparin (LOVENOX) injection  30 mg Subcutaneous Q24H  . famotidine  20 mg Oral Daily  . fluticasone  2 spray Each Nare BID  . mometasone-formoterol  2 puff Inhalation BID  . montelukast  10 mg Oral Daily  . multivitamin with minerals  1 tablet Oral Daily  . polyethylene glycol  17 g Oral Daily  . rOPINIRole  1 mg Oral BID  . senna-docusate  1 tablet Oral BID  . simvastatin  40 mg Oral q1800  . tamsulosin  0.4 mg Oral Daily  . vitamin C  500 mg Oral Daily   Continuous Infusions: . methocarbamol (ROBAXIN)  IV       LOS: 2 days    Time spent:  25 minutes    Edwin Dada, MD [facility] 01/19/2018, 10:51 AM     Pager [phone] --- please page though AMION:  [email or web address] Password [email or web address] If 7PM-7AM, please contact night-coverage

## 2018-01-19 NOTE — Plan of Care (Signed)
  Problem: Education: Goal: Knowledge of General Education information will improve Outcome: Progressing   Problem: Clinical Measurements: Goal: Respiratory complications will improve Outcome: Progressing   Problem: Activity: Goal: Risk for activity intolerance will decrease Outcome: Progressing

## 2018-01-19 NOTE — Progress Notes (Signed)
Pt refusing CPAP

## 2018-01-19 NOTE — Progress Notes (Addendum)
Occupational Therapy Treatment Patient Details Name: Micheal Frank MRN: [id number] DOB: 04/05/1926 Today's Date: 01/19/2018    History of present illness Pt is a 84 y.o. male admitted after falling at home in which he sustained multiple L rib fractures. PMH including but not limited to CKD, COPD, HLD, L TKA in 2003 with revision in 2012.   OT comments  Pt progressing. Recommending SNF at this time and pt agreeable. Feel pt will continue to benefit from acute OT to increase independence prior to d/c.   Follow Up Recommendations  SNF;Supervision/Assistance - 24 hour    Equipment Recommendations  3 in 1 bedside commode    Recommendations for Other Services      Precautions / Restrictions Precautions Precautions: Fall Restrictions Weight Bearing Restrictions: No       Mobility Bed Mobility Overal bed mobility: Needs Assistance Bed Mobility: Supine to Sit;Sit to Supine     Supine to sit: Mod assist Sit to supine: Mod assist   General bed mobility comments: assist with trunk and hips to come to EOB. OT assisted with LEs to return to bed and helped assist pt to scoot HOB and used reverse trendelenburg positioning of bed.   Transfers                 General transfer comment: tech had just gotten pt settled in bed when OT arrived.     Balance Overall balance assessment: History of Falls      No physical assist needed sitting EOB during ADL tasks.                                    ADL either performed or assessed with clinical judgement   ADL Overall ADL's : Needs assistance/impaired     Grooming: Wash/dry face;Oral care;Minimal assistance;Sitting Grooming Details (indicate cue type and reason): performed sitting EOB-OT rinsed off dentures for pt at sink Upper Body Bathing: Sitting;Set up;Supervision/ safety Upper Body Bathing Details (indicate cue type and reason): washed armpits sitting EOB     Upper Body Dressing : Sitting;Moderate  assistance(OT assisted with gown sitting EOB)                   Functional mobility during ADLs: Moderate assistance(bed mobility) General ADL Comments: Educated on technique to possibly help reach under RUE and use of long handled sponge to help. Explained that hugging pillow may help reduce pain during laughing or coughing.      Vision       Perception     Praxis      Cognition Arousal/Alertness: Awake/alert Behavior During Therapy: WFL for tasks assessed/performed Overall Cognitive Status: No family/caregiver present to determine baseline cognitive functioning                                          Exercises     Shoulder Instructions       General Comments      Pertinent Vitals/ Pain       Pain Assessment: 0-10 Pain Score: 8  Pain Location: Lt ribs/back Pain Descriptors / Indicators: Moaning;Sharp Pain Intervention(s): Monitored during session;Repositioned  Home Living  Prior Functioning/Environment              Frequency  Min 2X/week        Progress Toward Goals  OT Goals(current goals can now be found in the care plan section)  Progress towards OT goals: Progressing toward goals  Acute Rehab OT Goals Patient Stated Goal: not stated OT Goal Formulation: With patient Time For Goal Achievement: 01/31/18 Potential to Achieve Goals: Fair ADL Goals Pt Will Perform Grooming: sitting;with set-up Pt Will Perform Upper Body Dressing: with supervision;sitting Pt Will Perform Lower Body Dressing: sitting/lateral leans;sit to/from stand;with min assist Pt Will Transfer to Toilet: with min assist;bedside commode;stand pivot transfer Additional ADL Goal #1: Pt will complete bed mobility using log rolling technique with minimal assistance.  Plan Discharge plan remains appropriate    Co-evaluation                 AM-PAC PT "6 Clicks" Daily Activity     Outcome  Measure   Help from another person eating meals?: None Help from another person taking care of personal grooming?: A Little Help from another person toileting, which includes using toliet, bedpan, or urinal?: A Lot Help from another person bathing (including washing, rinsing, drying)?: A Lot Help from another person to put on and taking off regular upper body clothing?: A Lot Help from another person to put on and taking off regular lower body clothing?: A Lot 6 Click Score: 15    End of Session Equipment Utilized During Treatment: Oxygen  OT Visit Diagnosis: Pain Pain - Right/Left: Left Pain - part of body: (ribs/back)   Activity Tolerance Patient tolerated treatment well   Patient Left in bed;with call bell/phone within reach;with bed alarm set   Nurse Communication          Time: 4098-1191 OT Time Calculation (min): 19 min  Charges: OT General Charges $OT Visit: 1 Visit OT Treatments $Self Care/Home Management : 8-22 mins    Toini Failla L Carmellia Kreisler OTR/L 01/19/2018, 9:46 AM

## 2018-01-19 NOTE — Plan of Care (Signed)
  Problem: Education: Goal: Knowledge of General Education information will improve Outcome: Progressing   Problem: Health Behavior/Discharge Planning: Goal: Ability to manage health-related needs will improve Outcome: Progressing   Problem: Clinical Measurements: Goal: Ability to maintain clinical measurements within normal limits will improve Outcome: Progressing Goal: Will remain free from infection Outcome: Progressing   Problem: Activity: Goal: Risk for activity intolerance will decrease Outcome: Progressing   Problem: Pain Managment: Goal: General experience of comfort will improve Outcome: Progressing   Problem: Safety: Goal: Ability to remain free from injury will improve Outcome: Progressing

## 2018-01-20 LAB — CBC
HCT: 39.8 % (ref 39.0–52.0)
HEMOGLOBIN: 12.7 g/dL — AB (ref 13.0–17.0)
MCH: 30.8 pg (ref 26.0–34.0)
MCHC: 31.9 g/dL (ref 30.0–36.0)
MCV: 96.6 fL (ref 78.0–100.0)
PLATELETS: 213 10*3/uL (ref 150–400)
RBC: 4.12 MIL/uL — AB (ref 4.22–5.81)
RDW: 14.3 % (ref 11.5–15.5)
WBC: 14 10*3/uL — ABNORMAL HIGH (ref 4.0–10.5)

## 2018-01-20 NOTE — Progress Notes (Signed)
PROGRESS NOTE    Micheal Frank  BWG:[id number] DOB: February 27, 1926 DOA: 01/16/2018 PCP: Colon Branch, MD      Brief Narrative:  Micheal Frank is a 84 y.o. M with hx CKD III, COPD not on O2, remote hx SDH, remote hx epidural abscess and chronic constipation who presented two days ago with fall and rib pain.  Found to have acute left 6-8 rib fractures, CT head normal.  Went home, but came back with intractable pain.  Repeat CXR showed no development of PNA, pneumothorax or effusion.        Assessment & Plan:  Multiple LEFT sided 6-7 rib fractures -Continue scheduled acetaminophen -Continue topical NSAID given chronic kidney disease -Continue Robaxin, oxycodone as needed -Continue aggressive incentive spirometry -Wean oxygen as able -Ambulate in hall   Sleep apnea Patient had been on CPAP years ago, didn't tolerate it, has been off.  Now OSA being aggravated by opiates.  Refusing CPAP overnight. -Continue to offer CPAP at night and with naps  COPD No active disease, wheezing -Continue home inhalers  Chronic kidney disease stage III Creatinine at baseline  BPH -Continue Flomax doxazosin  Chronic constipation -Continue MiraLAX, Senokot-S  Other medications -Continue statin, bupropion, Requip  Leukocytosis Improving.  No clinical signs of infection. -Trend CBC     DVT prophylaxis: Lovenox Code Status: FULL Family Communication: Daughter MDM and disposition Plan:  The below labs and imaging reports were reviewed and summarized above.     Patient was admitted with multiple rib fractures, in the context of advanced age, underlying COPD, untreated sleep apnea, and failure of outpatient therapy.  He is high risk therefore for complications.  He has been evaluated by physical therapy, assist for bed mobility, transfers and has minimal ambulatory capacity.  They recommended SNF.  Will require SNF rehab, insurance authorization is pending, while we wean oxygen, continue  incentive spirometry and aggressive pain control.         Consultants:   None    Procedures:   None   Antimicrobials:   None    Subjective: He continues to have rib pain with breathing, coughing.  He has no new fever, cough, sputum, respiratory distress, confusion.  He has no urinary symptoms.     Objective: Vitals:   01/20/18 1011 01/20/18 1329 01/20/18 1340 01/20/18 1545  BP:  125/60    Pulse:  87    Resp:  18    Temp:      TempSrc:      SpO2: 90% (!) 87% 93% 92%  Weight:      Height:        Intake/Output Summary (Last 24 hours) at 01/20/2018 1645 Last data filed at 01/20/2018 0650 Gross per 24 hour  Intake -  Output 450 ml  Net -450 ml   Filed Weights   01/16/18 1426  Weight: 67.1 kg (148 lb)    Examination: General appearance: Elderly adult male, hard of hearing, sitting in recliner, interactive.   HEENT: Anicteric, conjunctival pink, lids and lashes normal.  No nasal deformity, discharge, or epistaxis.  Lips moist, dentures in place.  Oropharynx very dry, no oral lesions, hearing diminished. Skin: Skin warm and dry, no jaundice, no suspicious rashes or lesions. Cardiac: Regular rate and rhythm, no murmurs, no lower extremity edema. Respiratory: Respirations shallow, no increased respiratory effort, no rales or wheezes. Abdomen: Abdomen is soft without tenderness to palpation, ascites, or distention. MSK: No muscular deformities or effusions in the large joints of the upper  or lower extremities bilaterally. Neuro: Extraocular movements intact, left pupil larger than right, fixed, chronic.  Speech fluent, moves upper extremities and lower extremities with global weakness but symmetric strength.     Psych: Sensorium intact responding to questions, intention normal, affect blunted, judgment and insight appear diminished by dementia.    Data Reviewed: I have personally reviewed following labs and imaging studies:  CBC: Recent Labs  Lab 01/16/18 1556  01/19/18 0640 01/20/18 0337  WBC 12.5* 19.5* 14.0*  NEUTROABS 9.6*  --   --   HGB 15.2 13.2 12.7*  HCT 48.3 41.6 39.8  MCV 97.6 97.7 96.6  PLT 251 201 832   Basic Metabolic Panel: Recent Labs  Lab 01/16/18 1556  NA 141  K 3.9  CL 104  CO2 24  GLUCOSE 75  BUN 15  CREATININE 1.46*  CALCIUM 8.7*   GFR: Estimated Creatinine Clearance: 31.9 mL/min (A) (by C-G formula based on SCr of 1.46 mg/dL (H)). Liver Function Tests: No results for input(s): AST, ALT, ALKPHOS, BILITOT, PROT, ALBUMIN in the last 168 hours. No results for input(s): LIPASE, AMYLASE in the last 168 hours. No results for input(s): AMMONIA in the last 168 hours. Coagulation Profile: No results for input(s): INR, PROTIME in the last 168 hours. Cardiac Enzymes: No results for input(s): CKTOTAL, CKMB, CKMBINDEX, TROPONINI in the last 168 hours. BNP (last 3 results) No results for input(s): PROBNP in the last 8760 hours. HbA1C: No results for input(s): HGBA1C in the last 72 hours. CBG: No results for input(s): GLUCAP in the last 168 hours. Lipid Profile: No results for input(s): CHOL, HDL, LDLCALC, TRIG, CHOLHDL, LDLDIRECT in the last 72 hours. Thyroid Function Tests: No results for input(s): TSH, T4TOTAL, FREET4, T3FREE, THYROIDAB in the last 72 hours. Anemia Panel: No results for input(s): VITAMINB12, FOLATE, FERRITIN, TIBC, IRON, RETICCTPCT in the last 72 hours. Urine analysis:    Component Value Date/Time   COLORURINE YELLOW 07/23/2017 [place] 07/23/2017 1159   LABSPEC 1.020 07/23/2017 1159   PHURINE 7.0 07/23/2017 1159   GLUCOSEU NEGATIVE 07/23/2017 1159   HGBUR NEGATIVE 07/23/2017 1159   BILIRUBINUR NEGATIVE 07/23/2017 1159   KETONESUR NEGATIVE 07/23/2017 1159   PROTEINUR NEGATIVE 07/23/2017 1159   UROBILINOGEN 1.0 01/23/2013 0915   NITRITE NEGATIVE 07/23/2017 1159   LEUKOCYTESUR NEGATIVE 07/23/2017 1159   Sepsis Labs: @LABRCNTIP (procalcitonin:4,lacticacidven:4)  )No  results found for this or any previous visit (from the past 240 hour(s)).       Radiology Studies: No results found.      Scheduled Meds: . acetaminophen  1,000 mg Oral TID  . bisacodyl  5 mg Oral Daily  . buPROPion  200 mg Oral BID  . calcium carbonate  1,250 mg Oral Q supper  . diclofenac  1 patch Transdermal BID  . doxazosin  4 mg Oral QHS  . enoxaparin (LOVENOX) injection  30 mg Subcutaneous Q24H  . famotidine  20 mg Oral Daily  . fluticasone  2 spray Each Nare BID  . mometasone-formoterol  2 puff Inhalation BID  . montelukast  10 mg Oral Daily  . multivitamin with minerals  1 tablet Oral Daily  . polyethylene glycol  17 g Oral Daily  . rOPINIRole  1 mg Oral BID  . senna-docusate  1 tablet Oral BID  . simvastatin  40 mg Oral q1800  . tamsulosin  0.4 mg Oral Daily  . vitamin C  500 mg Oral Daily   Continuous Infusions: . methocarbamol (ROBAXIN)  IV  LOS: 3 days    Time spent: 25 minutes    Edwin Dada, MD [facility] 01/20/2018, 4:45 PM     Pager [phone] --- please page though AMION:  [email or web address] Password [email or web address] If 7PM-7AM, please contact night-coverage

## 2018-01-20 NOTE — Progress Notes (Signed)
Pt continues to refuse CPAP °

## 2018-01-21 LAB — CBC
HCT: 37.8 % — ABNORMAL LOW (ref 39.0–52.0)
Hemoglobin: 12.1 g/dL — ABNORMAL LOW (ref 13.0–17.0)
MCH: 30.9 pg (ref 26.0–34.0)
MCHC: 32 g/dL (ref 30.0–36.0)
MCV: 96.7 fL (ref 78.0–100.0)
PLATELETS: 218 10*3/uL (ref 150–400)
RBC: 3.91 MIL/uL — ABNORMAL LOW (ref 4.22–5.81)
RDW: 14.6 % (ref 11.5–15.5)
WBC: 10.9 10*3/uL — ABNORMAL HIGH (ref 4.0–10.5)

## 2018-01-21 NOTE — Plan of Care (Signed)
  Problem: Education: Goal: Knowledge of General Education information will improve Outcome: Progressing   Problem: Education: Goal: Knowledge of General Education information will improve Outcome: Progressing   Problem: Clinical Measurements: Goal: Ability to maintain clinical measurements within normal limits will improve Outcome: Progressing Goal: Will remain free from infection Outcome: Progressing Goal: Diagnostic test results will improve Outcome: Progressing Goal: Respiratory complications will improve Outcome: Progressing Goal: Cardiovascular complication will be avoided Outcome: Progressing   Problem: Activity: Goal: Risk for activity intolerance will decrease Outcome: Progressing   Problem: Coping: Goal: Level of anxiety will decrease Outcome: Progressing   Problem: Elimination: Goal: Will not experience complications related to bowel motility Outcome: Progressing Goal: Will not experience complications related to urinary retention Outcome: Progressing

## 2018-01-21 NOTE — Progress Notes (Signed)
PROGRESS NOTE    [facility]  VVO:[id number] DOB: 06-Mar-1926 DOA: 01/16/2018 PCP: Colon Branch, MD      Brief Narrative:  M[facility] Frank is a 84 y.o. M with hx CKD III, COPD not on O2, remote hx SDH, remote hx epidural abscess and chronic constipation who presented two days ago with fall and rib pain.  Found to have acute left 6th through 8th rib fractures, CT head normal.  Went home, but came back with intractable pain.  Repeat CXR showed no development of PNA, pneumothorax or effusion.  Admitted for pain control, PT eval, O2.        Assessment & Plan:  Multiple LEFT sided 6-7 rib fractures -Continue scheduled acetaminophen -Continue topical NSAID given chronic kidney disease -Continue Robaxin, oxycodone as needed -Continue aggressive's incentive spirometry -Intermittent O2 with ambulation as needed -Attempt to wean oxygen at SNF    Sleep apnea Patient had been on CPAP years ago, didn't tolerate it, has been off.  Now OSA being aggravated by opiates.  Refusing CPAP overnight. -Continue to offer CPAP at night and with naps  COPD No active disease, wheezing -Continue home inhalers  Chronic kidney disease stage III Creatinine at baseline  BPH -Continue Flomax, doxazosin  Chronic constipation -Continue MiraLAX, Senokot-S  Other medications -Continue statin, bupropion, Requip  Leukocytosis Continues to improve.  No fever, increased cough or sputum, dysuria, suprapubic pain.   Delirium Patient having transient hallucinations today.  Likely from oxycodone.  No clinicaly signs of infection.  He may have some underlying mild congnitive impairment or early dementia.        DVT prophylaxis: Lovenox Code Status: FULL Family Communication: Daughter at the bedside MDM and disposition Plan:  The below labs and imaging reports were reviewed and summarized above.  The patient was admitted with multiple rib fractures, in the context of advanced age, underlying COPD,  untreated sleep apnea, and failure of outpatient therapy.   He has had no fever or worsening leukocytosis to suggest pneumonia or pneumothorax or pleural effusion or UTI.  He has been evaluated by physical therapy, and requires assistance for bed mobility, transfers, and can walk only 15 to 20 feet before having to stop to rest.  He will require SNF rehab, insurance authorization pending, will wean oxygen, and continue incentive spirometry, and aggressive pain control.      Consultants:   None    Procedures:   None   Antimicrobials:   None    Subjective: Rib pain with coughing or deep breathing.  No new fever, cough, change in sputum.  No respiratory distress.  No dysuria, urinary frequency, urinary urgency, hematuria, pyuria, suprapubic pain.     Objective: Vitals:   01/20/18 2223 01/21/18 0635 01/21/18 0846 01/21/18 1107  BP: (!) 144/74 131/61    Pulse: 85 80    Resp:      Temp: 98 F (36.7 C) 98.3 F (36.8 C)    TempSrc: Oral Oral    SpO2: 91% 90% 100% (!) 84%  Weight:      Height:        Intake/Output Summary (Last 24 hours) at 01/21/2018 1738 Last data filed at 01/21/2018 0830 Gross per 24 hour  Intake 476 ml  Output 500 ml  Net -24 ml   Filed Weights   01/16/18 1426  Weight: 67.1 kg (148 lb)    Examination: General appearance: Elderly adult male, hard of hearing, sitting in bed, interactive. HEENT: Anicteric, conjunctive are pink, lids and lashes  normal.  No nasal deformity, discharge, or epistaxis.  Lips moist, dentures in place.  Oropharynx v moist, no oral lesions, hearing diminished although hearing aids are in place today. Skin: Skin warm and dry, no jaundice, no suspicious rashes or lesions. Cardiac: Regular rate and rhythm, no murmurs, no lower extremity edema. Respiratory: Respirations shallow, no increased respiratory effort, no rales or wheezes. Abdomen: Abdomen soft without tenderness to palpation, no suprapubic tenderness.  No ascites,  distention, hepatospleno megaly. MSK: No muscular deformities or effusions in the large joints of the upper or lower extremities bilaterally.  Diffuse loss of muscle mass and fat. Neuro: Grossly normal, left pupil larger than right, fixed, chronic.  Speech fluent, moves upper and lower extremities with global weakness but symmetric strength.     Psych: Sensorium intact and responding to questions, attention normal, affect blunted, judgment and insight appear diminished by dementia.       Data Reviewed: I have personally reviewed following labs and imaging studies:  CBC: Recent Labs  Lab 01/16/18 1556 01/19/18 0640 01/20/18 0337 01/21/18 0450  WBC 12.5* 19.5* 14.0* 10.9*  NEUTROABS 9.6*  --   --   --   HGB 15.2 13.2 12.7* 12.1*  HCT 48.3 41.6 39.8 37.8*  MCV 97.6 97.7 96.6 96.7  PLT 251 201 213 542   Basic Metabolic Panel: Recent Labs  Lab 01/16/18 1556  NA 141  K 3.9  CL 104  CO2 24  GLUCOSE 75  BUN 15  CREATININE 1.46*  CALCIUM 8.7*   GFR: Estimated Creatinine Clearance: 31.9 mL/min (A) (by C-G formula based on SCr of 1.46 mg/dL (H)). Liver Function Tests: No results for input(s): AST, ALT, ALKPHOS, BILITOT, PROT, ALBUMIN in the last 168 hours. No results for input(s): LIPASE, AMYLASE in the last 168 hours. No results for input(s): AMMONIA in the last 168 hours. Coagulation Profile: No results for input(s): INR, PROTIME in the last 168 hours. Cardiac Enzymes: No results for input(s): CKTOTAL, CKMB, CKMBINDEX, TROPONINI in the last 168 hours. BNP (last 3 results) No results for input(s): PROBNP in the last 8760 hours. HbA1C: No results for input(s): HGBA1C in the last 72 hours. CBG: No results for input(s): GLUCAP in the last 168 hours. Lipid Profile: No results for input(s): CHOL, HDL, LDLCALC, TRIG, CHOLHDL, LDLDIRECT in the last 72 hours. Thyroid Function Tests: No results for input(s): TSH, T4TOTAL, FREET4, T3FREE, THYROIDAB in the last 72 hours. Anemia  Panel: No results for input(s): VITAMINB12, FOLATE, FERRITIN, TIBC, IRON, RETICCTPCT in the last 72 hours. Urine analysis:    Component Value Date/Time   COLORURINE YELLOW 07/23/2017 [facility] 07/23/2017 1159   LABSPEC 1.020 07/23/2017 1159   PHURINE 7.0 07/23/2017 1159   GLUCOSEU NEGATIVE 07/23/2017 1159   HGBUR NEGATIVE 07/23/2017 1159   BILIRUBINUR NEGATIVE 07/23/2017 1159   KETONESUR NEGATIVE 07/23/2017 1159   PROTEINUR NEGATIVE 07/23/2017 1159   UROBILINOGEN 1.0 01/23/2013 0915   NITRITE NEGATIVE 07/23/2017 1159   LEUKOCYTESUR NEGATIVE 07/23/2017 1159   Sepsis Labs: @LABRCNTIP (procalcitonin:4,lacticacidven:4)  )No results found for this or any previous visit (from the past 240 hour(s)).       Radiology Studies: No results found.      Scheduled Meds: . acetaminophen  1,000 mg Oral TID  . bisacodyl  5 mg Oral Daily  . buPROPion  200 mg Oral BID  . calcium carbonate  1,250 mg Oral Q supper  . diclofenac  1 patch Transdermal BID  . doxazosin  4 mg Oral  QHS  . enoxaparin (LOVENOX) injection  30 mg Subcutaneous Q24H  . famotidine  20 mg Oral Daily  . fluticasone  2 spray Each Nare BID  . mometasone-formoterol  2 puff Inhalation BID  . montelukast  10 mg Oral Daily  . multivitamin with minerals  1 tablet Oral Daily  . polyethylene glycol  17 g Oral Daily  . rOPINIRole  1 mg Oral BID  . senna-docusate  1 tablet Oral BID  . simvastatin  40 mg Oral q1800  . tamsulosin  0.4 mg Oral Daily  . vitamin C  500 mg Oral Daily   Continuous Infusions: . methocarbamol (ROBAXIN)  IV       LOS: 4 days    Time spent: 25 minutes    Micheal Dada, MD [facility] 01/21/2018, 5:38 PM     Pager [phone] --- please page though AMION:  [email or web address] If 7PM-7AM, please contact night-coverage

## 2018-01-21 NOTE — Care Management Important Message (Signed)
Important Message  Patient Details  Name: JAKHARI SPACE MRN: [id number] Date of Birth: 03/25/1926   Medicare Important Message Given:  Yes    Lynnwood Beckford 01/21/2018, 4:20 PM

## 2018-01-21 NOTE — Progress Notes (Signed)
Refusing CPAP.

## 2018-01-21 NOTE — Progress Notes (Signed)
Physical Therapy Treatment Patient Details Name: [facility] MRN: [id number] DOB: 1925-10-12 Today's Date: 01/21/2018    History of Present Illness Pt is a 84 y.o. male admitted after falling at home in which he sustained multiple L rib fractures. PMH including but not limited to CKD, COPD, HLD, L TKA in 2003 with revision in 2012.    PT Comments    Pt is progressing with gait and mobility into the hallway today with second person following with chair for safety.  He did have a drop in his O2 sats with gait (see details below) and was unable to regain into the 90s with seated rest and pursed lip breathing.  We will need to test with oxygen next session.  Pt remains appropriate for post acute rehab at discharge.    Follow Up Recommendations  SNF     Equipment Recommendations  None recommended by PT    Recommendations for Other Services   NA     Precautions / Restrictions Precautions Precautions: Fall;Other (comment) Precaution Comments: monitor O2 sats    Mobility  Bed Mobility               General bed mobility comments: Pt was OOB in the recliner chair.   Transfers Overall transfer level: Needs assistance Equipment used: Rolling walker (2 wheeled) Transfers: Sit to/from Stand Sit to Stand: Min assist         General transfer comment: Min assist using momentum to stand with verbal cues to scoot closer to the edge of the chair and for safe hand placement.  Uncontrolled descent to sit after fatigue with short distance gait.   Ambulation/Gait Ambulation/Gait assistance: Min assist;+2 safety/equipment Gait Distance (Feet): 65 Feet Assistive device: Rolling walker (2 wheeled) Gait Pattern/deviations: Step-through pattern;Shuffle;Trunk flexed     General Gait Details: Verbal cues for upright posture and safe use of RW.  Attempted gait without O2 and sats dropped to 80% with pt only able to get it back up to 86% with pursed lip breathing.  Chair to follow to  encourage safety, seated rest break, and increased gait distance.        Balance Overall balance assessment: Needs assistance Sitting-balance support: Feet supported;Bilateral upper extremity supported Sitting balance-Leahy Scale: Fair     Standing balance support: Bilateral upper extremity supported Standing balance-Leahy Scale: Poor                              Cognition Arousal/Alertness: Awake/alert Behavior During Therapy: WFL for tasks assessed/performed Overall Cognitive Status: Within Functional Limits for tasks assessed                                 General Comments: Not specifically tested, but remembered how to use both incentive spirometer and flutter valve without cues.  Pt is also [facility] which makes processing information difficult.       Exercises Other Exercises Other Exercises: incentive spirometer x 10 reps, cues for technique, max inspired volume 1,750 mL and decreased with fatigue.  Other Exercises: Flutter valve x 10 reps. Other Exercises: Instructions to brace ribs with pillow while coughing and to perform flutter valve and incentive spirometer 10 times every hour.         Pertinent Vitals/Pain Pain Assessment: Faces Faces Pain Scale: Hurts little more Pain Location: Lt ribs/back Pain Descriptors / Indicators: Grimacing;Guarding Pain Intervention(s): Limited activity within  patient's tolerance;Monitored during session;Repositioned        PT Goals (current goals can now be found in the care plan section) Acute Rehab PT Goals Patient Stated Goal: not stated Progress towards PT goals: Progressing toward goals    Frequency    Min 3X/week      PT Plan Current plan remains appropriate       AM-PAC PT "6 Clicks" Daily Activity  Outcome Measure  Difficulty turning over in bed (including adjusting bedclothes, sheets and blankets)?: Unable Difficulty moving from lying on back to sitting on the side of the bed? :  Unable Difficulty sitting down on and standing up from a chair with arms (e.g., wheelchair, bedside commode, etc,.)?: Unable Help needed moving to and from a bed to chair (including a wheelchair)?: A Little Help needed walking in hospital room?: A Little Help needed climbing 3-5 steps with a railing? : A Little 6 Click Score: 12    End of Session   Activity Tolerance: Patient limited by fatigue;Patient limited by pain Patient left: in chair;with call bell/phone within reach;with family/visitor present   PT Visit Diagnosis: Other abnormalities of gait and mobility (R26.89);Pain Pain - Right/Left: Left Pain - part of body: (ribs)     Time: 9528-4132 PT Time Calculation (min) (ACUTE ONLY): 21 min  Charges:  $Gait Training: 8-22 mins          Kalmen Lollar B. Levan Aloia, PT, DPT [phone]           01/21/2018, 11:14 AM

## 2018-01-21 NOTE — Progress Notes (Addendum)
CSW following for discharge plan. Patient currently has no bed offers at his facility preferences; neither [facility] nor [facility] have offered a bed for the patient. CSW reached out to Admissions at both facilities; left voicemails. CSW will need to discuss with patient if he is unable to be placed at his preferences, then he will either need to look elsewhere or go home.   Patient will also need insurance authorization prior to transfer to SNF. Will need updated clinicals, insurance will want PT within the past 24 hours. Paged PT to see when able. CSW will fax information after PT update received.  CSW to follow.  Laveda Abbe, [place] Clinical Social Worker [phone]

## 2018-01-21 NOTE — Progress Notes (Signed)
CSW following for discharge plan. CSW contacted by Admissions at [facility], they do not have access to review referrals electronically. CSW sent a hard fax of referral for review. Awaiting response.  CSW to follow.  Laveda Abbe, [place] Clinical Social Worker [phone]

## 2018-01-22 DIAGNOSIS — I10 Essential (primary) hypertension: Secondary | ICD-10-CM

## 2018-01-22 DIAGNOSIS — S2242XA Multiple fractures of ribs, left side, initial encounter for closed fracture: Principal | ICD-10-CM

## 2018-01-22 MED ORDER — POLYETHYLENE GLYCOL 3350 17 G PO PACK: 17.0000 g | PACK | Freq: Every day | ORAL | 0 refills | Status: DC | PRN

## 2018-01-22 MED ORDER — MAGNESIUM CITRATE PO SOLN
1.0000 | Freq: Once | ORAL | Status: AC
  Administered 2018-01-22: 1 via ORAL
  Filled 2018-01-22: qty 296

## 2018-01-22 MED ORDER — SENNOSIDES-DOCUSATE SODIUM 8.6-50 MG PO TABS: 1.0000 | ORAL_TABLET | Freq: Two times a day (BID) | ORAL | 0 refills | Status: AC

## 2018-01-22 MED ORDER — METHOCARBAMOL 500 MG PO TABS: 500.0000 mg | ORAL_TABLET | Freq: Three times a day (TID) | ORAL | 0 refills | Status: DC | PRN

## 2018-01-22 MED ORDER — OXYCODONE HCL 5 MG PO TABS: 2.5000 mg | ORAL_TABLET | Freq: Four times a day (QID) | ORAL | 0 refills | Status: DC | PRN

## 2018-01-22 MED ORDER — DICLOFENAC EPOLAMINE 1.3 % TD PTCH: 1.0000 | MEDICATED_PATCH | Freq: Two times a day (BID) | TRANSDERMAL | 0 refills | Status: DC

## 2018-01-22 MED ORDER — ACETAMINOPHEN 500 MG PO TABS: 1000.0000 mg | ORAL_TABLET | Freq: Three times a day (TID) | ORAL | 0 refills | Status: AC | PRN

## 2018-01-22 NOTE — Care Management (Signed)
Case manager notified [facility], [facility] of patient being discharge to home rather than SNF.

## 2018-01-22 NOTE — Discharge Summary (Signed)
Micheal Frank, is a 84 y.o. male  DOB 1926-04-05  MRN [id number].  Admission date:  01/16/2018  Admitting Physician  Georgette Shell, MD  Discharge Date:  01/22/2018   Primary MD  Colon Branch, MD  Recommendations for primary care physician for things to follow:  -Please check CBC, BMP, during next visit, repeat two-view chest x-ray in 2 to 3 weeks   Admission Diagnosis  Closed fracture of multiple ribs of left side, initial encounter [S22.42XA]   Discharge Diagnosis  Closed fracture of multiple ribs of left side, initial encounter [S22.42XA]    Active Problems:   Fall   Essential hypertension   BPH (benign prostatic hyperplasia)   Multiple rib fractures      Past Medical History:  Diagnosis Date  . Arthritis    left leg also has swelling  . Asthma   . Blood dyscrasia    pt CAN NOT have any blood thinners d/t hx brain bleed  . Chronic kidney disease, stage III (moderate) ([place]) 01/20/2013  . Complication of anesthesia    confusion with anesthesia  . Constipation    miralax prn  . COPD (chronic obstructive pulmonary disease) ([facility])    emphysema  . Depression    takes wellbutrin bid  . Epidural abscess   . Gastric ulcer   . History of blood clots   . Hyperlipidemia   . Melanoma ([place])   . Peripheral neuropathy   . Pneumonia    as a child and in 1952;had a pneumonia vaccine couple of years ago  . Prosthetic joint infection ([place])   . S/P right heart catheterization    at least 70yrs  . Shingles   . Short-term memory loss   . Shortness of breath    with exertion  . Sleep apnea    has CPAP but doesn't use it;sleep study done at least 4yrs ago  . Staphylococcus aureus bacteremia with sepsis ([place])   . Subdural hematoma ([place]) 03/30/11  . Urinary frequency    wears depends    Past Surgical History:  Procedure Laterality Date  . epidural abscess drainage  12/2010  . EXTERNAL EAR  SURGERY     shunt placed in right ear d/t mennieres   . EYE SURGERY     bil cataract surgery  . FILTERING PROCEDURE     filter placed in neck d/t blood  clot  . HERNIA REPAIR     25+yrs ago  . KNEE ARTHROSCOPY    . SKIN BIOPSY     melanoma on head   . TOTAL KNEE ARTHROPLASTY     x 2 left knee 2003/2012  . TOTAL KNEE REVISION  05/23/2011   Procedure: TOTAL KNEE REVISION;  Surgeon: Meredith Pel;  Location: [place] [facility];  Service: Orthopedics;  Laterality: Left;  LEFT KNEE REMOVAL OF SPACER, POSSIBLE REIMPLANTATION OF REVISION TKA VERSES REPLACEMENT ANTIBIOTIC SPACER       History of present illness and  Hospital Course:     Kindly see H&P for  history of present illness and admission details, please review complete Labs, Consult reports and Test reports for all details in brief  HPI  from the history and physical done on the day of admission 01/16/2018  HPI: Micheal Frank is a 84 y.o. male with medical history significant of CKD stage III, constipation, depression, COPD not on oxygen, history of epidural abscess, history of subdural hematoma admitted status post mechanical fall yesterday.  Patient was opening the door for his grandson who is wheelchair-bound due to osteogenesis imperfecta.  He slipped and fell.  He was in his usual state of health until then.  He denied any nausea vomiting diarrhea, fever chills, cough, chest pain, shortness of breath, headache changes with his vision urinary complaints.  He went to [facility] ER yesterday and left home without getting admitted thinking he is able to take care of himself at home.  He lives at home with his daughter and daughter's son who is handicapped.  He took a lot of morphine at home which did not help his pain so he decided to come to the emergency room.    ED Course: Received Dilaudid 1 mg, Flector patch.  Sodium 141 potassium 3.9 BUN 15 creatinine 1.46, white count 12.5 hemoglobin 15.2, platelet count 251.  CT head no skull  fracture or intracranial hemorrhage.  Rib x-rays acute left sixth seventh and eighth rib fractures no pleural effusion or pneumothorax.  TSH mildly elevated at 5.07, T3 2.024 is 0.79.     Hospital Course  Micheal Frank is a 84 y.o. M with hx CKD III, COPD not on O2, remote hx SDH, remote hx epidural abscess and chronic constipation who presented two days ago with fall and rib pain.  Found to have acute left 6th through 8th rib fractures, CT head normal.  Went home, but came back with intractable pain.  Repeat CXR showed no development of PNA, pneumothorax or effusion.  Admitted for pain control, PT eval, O2.  Multiple LEFT sided 6-7 rib fractures - improved, on a scheduled aspirin, typical NSAIDs during hospital stay given chronic CKD, on PRN Robaxin and oxycodone during hospital stay, but no requirement over last couple days, no further hypoxia, saturating 96% on room air at rest, discussed with daughter at bedside, she wants patient to go home, he will go home on PRN oxycodone and Robaxin, as well I changed his Tylenol to as needed, will go on Voltaren patch as well. -I have discussed with her patient will take his incentive spirometry and flutter valve and keep using them at home -PT recommended SNF, I have discussed with the daughter, she wants him to go home which I think is appropriate, will arrange for home PT, social worker and aide  Hypertension -Blood pressure has been acceptable to low during hospital stay, continue to hold Maxizide on discharge  Sleep apnea Patient had been on CPAP years ago, didn't tolerate it, has been off.  Now OSA being aggravated by opiates.  Refusing CPAP overnight.  COPD No active disease, wheezing -Continue home inhalers  Chronic kidney disease stage III Creatinine at baseline  BPH -Continue Flomax, doxazosin  Chronic constipation -Continue MiraLAX, Senokot-S  Other medications -Continue statin, bupropion, Requip  Leukocytosis Continues to  improve.  No fever, increased cough or sputum, dysuria, suprapubic pain.   Delirium Patient having transient hallucinations today.  Likely from oxycodone.  No clinicaly signs of infection.  He may have some underlying mild congnitive impairment or early dementia.  Discharge Condition:  Stable   Follow UP  Follow-up Information    [facility].   Specialty:  [place] Why:  A representative from [facility] will contact you to arrange start date and time for your therapy.  Contact information: [address] [place] [address] [phone]        Colon Branch, MD Follow up in 1 week(s).   Specialty:  Internal Medicine Contact information: [address] [place] [address] [phone]             Discharge Instructions  and  Discharge Medications    Discharge Instructions    Discharge instructions   Complete by:  As directed    Follow with Primary MD Colon Branch, MD in 7 days   Get CBC, CMP, 2 view Chest X ray checked  by Primary MD next visit.    Activity: As tolerated with Full fall precautions use walker/cane & assistance as needed   Disposition Home    Diet: Heart Healthy , with feeding assistance and aspiration precautions.  For Heart failure patients - Check your Weight same time everyday, if you gain over 2 pounds, or you develop in leg swelling, experience more shortness of breath or chest pain, call your Primary MD immediately. Follow Cardiac Low Salt Diet and 1.5 lit/day fluid restriction.   On your next visit with your primary care physician please Get Medicines reviewed and adjusted.   Please request your Prim.MD to go over all Hospital Tests and Procedure/Radiological results at the follow up, please get all Hospital records sent to your Prim MD by signing hospital release before you go home.   If you experience worsening of your admission symptoms, develop shortness  of breath, life threatening emergency, suicidal or homicidal thoughts you must seek medical attention immediately by calling 911 or calling your MD immediately  if symptoms less severe.  You Must read complete instructions/literature along with all the possible adverse reactions/side effects for all the Medicines you take and that have been prescribed to you. Take any new Medicines after you have completely understood and accpet all the possible adverse reactions/side effects.   Do not drive, operating heavy machinery, perform activities at heights, swimming or participation in water activities or provide baby sitting services if your were admitted for syncope or siezures until you have seen by Primary MD or a Neurologist and advised to do so again.  Do not drive when taking Pain medications.    Do not take more than prescribed Pain, Sleep and Anxiety Medications  Special Instructions: If you have smoked or chewed Tobacco  in the last 2 yrs please stop smoking, stop any regular Alcohol  and or any Recreational drug use.  Wear Seat belts while driving.   Please note  You were cared for by a hospitalist during your hospital stay. If you have any questions about your discharge medications or the care you received while you were in the hospital after you are discharged, you can call the unit and asked to speak with the hospitalist on call if the hospitalist that took care of you is not available. Once you are discharged, your primary care physician will handle any further medical issues. Please note that NO REFILLS for any discharge medications will be authorized once you are discharged, as it is imperative that you return to your primary care physician (or establish a relationship with a primary care  physician if you do not have one) for your aftercare needs so that they can reassess your need for medications and monitor your lab values.   Increase activity slowly   Complete by:  As directed       Allergies as of 01/22/2018      Reactions   Anticoagulant Compound    History of bleeding on the brain per daughter   Protonix [pantoprazole Sodium] Diarrhea      Medication List    STOP taking these medications   HYDROcodone-acetaminophen 5-325 MG tablet Commonly known as:  NORCO/VICODIN   morphine 15 MG tablet Commonly known as:  MSIR   triamterene-hydrochlorothiazide 37.5-25 MG tablet Commonly known as:  MAXZIDE-25     TAKE these medications   acetaminophen 500 MG tablet Commonly known as:  TYLENOL Take 2 tablets (1,000 mg total) by mouth every 8 (eight) hours as needed for mild pain. What changed:    medication strength  how much to take  when to take this  reasons to take this   albuterol 108 (90 Base) MCG/ACT inhaler Commonly known as:  PROAIR HFA Inhale 2 puffs into the lungs every 6 (six) hours as needed for wheezing.   AZO CRANBERRY URINARY TRACT 250-60 MG Caps Generic drug:  Cranberry-Vitamin C Take 1 tablet by mouth daily.   benzonatate 200 MG capsule Commonly known as:  TESSALON TAKE ONE CAPSULE 3-4 TIMES DAILY AS NEEDED FOR COUGH What changed:    how much to take  how to take this  when to take this  reasons to take this  additional instructions   budesonide-formoterol 160-4.5 MCG/ACT inhaler Commonly known as:  SYMBICORT Inhale 2 puffs into the lungs 2 (two) times daily.   buPROPion 200 MG 12 hr tablet Commonly known as:  WELLBUTRIN SR Take 1 tablet (200 mg total) by mouth 2 (two) times daily.   calcium carbonate 600 MG Tabs tablet Commonly known as:  OS-CAL Take 600 mg by mouth daily.   diazepam 5 MG tablet Commonly known as:  VALIUM Take 1 tablet (5 mg total) by mouth every 6 (six) hours as needed for anxiety.   diclofenac 1.3 % Ptch Commonly known as:  FLECTOR Place 1 patch onto the skin 2 (two) times daily.   doxazosin 4 MG tablet Commonly known as:  CARDURA Take 1 tablet (4 mg total) by mouth at bedtime.    fluticasone 50 MCG/ACT nasal spray Commonly known as:  FLONASE Place 2 sprays into both nostrils 2 (two) times daily.   meclizine 25 MG tablet Commonly known as:  ANTIVERT Take 1 tablet (25 mg total) by mouth daily as needed (vertigo).   methocarbamol 500 MG tablet Commonly known as:  ROBAXIN Take 1 tablet (500 mg total) by mouth every 8 (eight) hours as needed for muscle spasms.   montelukast 10 MG tablet Commonly known as:  SINGULAIR Take 1 tablet (10 mg total) by mouth daily.   multivitamin with minerals Tabs tablet Take 1 tablet by mouth daily.   oxyCODONE 5 MG immediate release tablet Commonly known as:  Oxy IR/ROXICODONE Take 0.5 tablets (2.5 mg total) by mouth every 6 (six) hours as needed for breakthrough pain.   polyethylene glycol packet Commonly known as:  MIRALAX / GLYCOLAX Take 17 g by mouth daily as needed for mild constipation.   PROBIOTIC PO Take 1 capsule by mouth daily.   ranitidine 300 MG tablet Commonly known as:  ZANTAC Take 1 tablet (300 mg total) by mouth at  bedtime.   rOPINIRole 1 MG tablet Commonly known as:  REQUIP Take 1 tablet (1 mg total) by mouth 2 (two) times daily.   saccharomyces boulardii 250 MG capsule Commonly known as:  FLORASTOR Take 1 capsule (250 mg total) by mouth 2 (two) times daily.   senna-docusate 8.6-50 MG tablet Commonly known as:  Senokot-S Take 1 tablet by mouth 2 (two) times daily.   simvastatin 40 MG tablet Commonly known as:  ZOCOR Take 1 tablet (40 mg total) by mouth daily.   tamsulosin 0.4 MG Caps capsule Commonly known as:  FLOMAX Take 1 capsule (0.4 mg total) by mouth daily.         Diet and Activity recommendation: See Discharge Instructions above   Consults obtained -  None   Major procedures and Radiology Reports - PLEASE review detailed and final reports for all details, in brief -     Dg Chest 2 View  Result Date: 01/16/2018 CLINICAL DATA:  Worsening rib pain since falling yesterday,  LEFT side chest pain since EXAM: CHEST - 2 VIEW COMPARISON:  01/15/2018 FINDINGS: Normal heart size, mediastinal contours, and pulmonary vascularity. Atherosclerotic calcifications aorta. Bibasilar atelectasis greater on LEFT. Small LEFT pleural effusion. Remaining lungs clear. No pneumothorax. Bones demineralized. Fractures of the lateral fifth sixth and seventh ribs identified. Degenerative changes of the thoracic spine. IMPRESSION: Fractures of the lateral LEFT fifth sixth and seventh ribs with associated basilar atelectasis and small LEFT pleural effusion. Electronically Signed   By: Lavonia Dana M.D.   On: 01/16/2018 16:49   Dg Ribs Unilateral W/chest Left  Result Date: 01/15/2018 CLINICAL DATA:  LEFT chest and rib pain following fall. Initial encounter. EXAM: LEFT RIBS AND CHEST - 3+ VIEW COMPARISON:  None. FINDINGS: Acute fractures of the LEFT 6th, 7th and 8th ribs noted. No pleural effusion or pneumothorax. Cardiomediastinal silhouette is unchanged. No airspace disease or pleural effusion. No other acute abnormalities identified. IMPRESSION: Acute LEFT 6th, 7th and 8th rib fractures. No pleural effusion or pneumothorax. Electronically Signed   By: Margarette Canada M.D.   On: 01/15/2018 19:17   Ct Head Wo Contrast  Result Date: 01/15/2018 CLINICAL DATA:  84 year old male post fall. Denies loss of consciousness. Does not think he hit his head. Initial encounter. EXAM: CT HEAD WITHOUT CONTRAST TECHNIQUE: Contiguous axial images were obtained from the base of the skull through the vertex without intravenous contrast. COMPARISON:  01/16/2013 CT. FINDINGS: Brain: Remote small right frontal subdural collection slightly smaller than on prior exam now with maximal thickness of 3 mm versus prior 4 mm. Slightly asymmetric prominent extra-axial spaces larger on the right supratentorially and symmetric infratentorially may reflect result of atrophy versus subdural hygromas. Overall, no acute intracranial hemorrhage or CT  evidence of large acute infarct. Remote basal ganglia/corona radiata infarcts and chronic microvascular changes. Moderate to marked global atrophy. No intracranial mass lesion noted on this unenhanced exam. Vascular: Vascular calcifications Skull: No skull fracture. Sinuses/Orbits: No acute orbital abnormality. Visualized paranasal sinuses are clear. Other: Prior right mastoidectomy. Temporomandibular joint degenerative changes. IMPRESSION: No skull fracture or acute intracranial hemorrhage. Chronic changes as detailed above. Electronically Signed   By: Genia Del M.D.   On: 01/15/2018 19:06   Dg Knee Complete 4 Views Left  Result Date: 01/15/2018 CLINICAL DATA:  Knee pain after fall EXAM: LEFT KNEE - COMPLETE 4+ VIEW COMPARISON:  06/26/2017 FINDINGS: Status post left knee replacement with normal alignment. Suspected interval revision of the prosthesis. No definite acute displaced fracture is  seen. Similar appearance of cortical bone thickening at the distal femur cortical bone thickening at the distal femur is not significantly changed. Suspected knee effusion. Dense vascular calcifications. IMPRESSION: Status post left knee replacement without definite acute osseous abnormality. Suspected knee effusion. Electronically Signed   By: Donavan Foil M.D.   On: 01/15/2018 19:21    Micro Results     No results found for this or any previous visit (from the past 240 hour(s)).     Today   Subjective:   Micheal Frank today has no headache,no chest or abdominal pain,no new weakness tingling or numbness, feels much better wants to go home today.   Objective:   Blood pressure (!) 117/53, pulse 75, temperature 99 F (37.2 C), temperature source Oral, resp. rate 16, height 5' 9.5" (1.765 m), weight 67.1 kg (148 lb), SpO2 97 %.   Intake/Output Summary (Last 24 hours) at 01/22/2018 1052 Last data filed at 01/22/2018 0500 Gross per 24 hour  Intake 240 ml  Output 1200 ml  Net -960 ml     Exam Awake Alert, Oriented x 3, No new F.N deficits, Normal affect, sitting in recliner no apparent distress Symmetrical Chest wall movement, Good air movement bilaterally, CTAB RRR,No Gallops,Rubs or new Murmurs, No Parasternal Heave +ve B.Sounds, Abd Soft, Non tender,  No rebound -guarding or rigidity. No Cyanosis, Clubbing or edema, No new Rash or bruise  Data Review   CBC w Diff:  Lab Results  Component Value Date   WBC 10.9 (H) 01/21/2018   HGB 12.1 (L) 01/21/2018   HGB 16.2 06/14/2007   HCT 37.8 (L) 01/21/2018   HCT 46.5 06/14/2007   PLT 218 01/21/2018   PLT 206 06/14/2007   LYMPHOPCT 8 01/16/2018   LYMPHOPCT 20.0 06/14/2007   MONOPCT 13 01/16/2018   MONOPCT 14.0 (H) 06/14/2007   EOSPCT 1 01/16/2018   EOSPCT 4.3 06/14/2007   BASOPCT 0 01/16/2018   BASOPCT 0.3 06/14/2007    CMP:  Lab Results  Component Value Date   NA 141 01/16/2018   K 3.9 01/16/2018   CL 104 01/16/2018   CO2 24 01/16/2018   BUN 15 01/16/2018   CREATININE 1.46 (H) 01/16/2018   CREATININE 1.27 07/24/2011   PROT 6.5 07/23/2017   ALBUMIN 3.5 07/23/2017   BILITOT 0.6 07/23/2017   ALKPHOS 77 07/23/2017   AST 25 07/23/2017   ALT 8 (L) 07/23/2017  .   Total Time in preparing paper work, data evaluation and todays exam - 42 minutes  Phillips Climes M.D on 01/22/2018 at 10:52 AM  [facility]   Office  [phone]

## 2018-01-22 NOTE — Progress Notes (Signed)
RN paged Dr. Landis Gandy about getting Mag citrate because pt has not had a BM since 7/2. Awaiting call back

## 2018-01-22 NOTE — Discharge Instructions (Signed)
Follow with Primary MD Colon Branch, MD in 7 days   Get CBC, CMP, 2 view Chest X ray checked  by Primary MD next visit.    Activity: As tolerated with Full fall precautions use walker/cane & assistance as needed   Disposition Home    Diet: Heart Healthy , with feeding assistance and aspiration precautions.  For Heart failure patients - Check your Weight same time everyday, if you gain over 2 pounds, or you develop in leg swelling, experience more shortness of breath or chest pain, call your Primary MD immediately. Follow Cardiac Low Salt Diet and 1.5 lit/day fluid restriction.   On your next visit with your primary care physician please Get Medicines reviewed and adjusted.   Please request your Prim.MD to go over all Hospital Tests and Procedure/Radiological results at the follow up, please get all Hospital records sent to your Prim MD by signing hospital release before you go home.   If you experience worsening of your admission symptoms, develop shortness of breath, life threatening emergency, suicidal or homicidal thoughts you must seek medical attention immediately by calling 911 or calling your MD immediately  if symptoms less severe.  You Must read complete instructions/literature along with all the possible adverse reactions/side effects for all the Medicines you take and that have been prescribed to you. Take any new Medicines after you have completely understood and accpet all the possible adverse reactions/side effects.   Do not drive, operating heavy machinery, perform activities at heights, swimming or participation in water activities or provide baby sitting services if your were admitted for syncope or siezures until you have seen by Primary MD or a Neurologist and advised to do so again.  Do not drive when taking Pain medications.    Do not take more than prescribed Pain, Sleep and Anxiety Medications  Special Instructions: If you have smoked or chewed Tobacco  in the  last 2 yrs please stop smoking, stop any regular Alcohol  and or any Recreational drug use.  Wear Seat belts while driving.   Please note  You were cared for by a hospitalist during your hospital stay. If you have any questions about your discharge medications or the care you received while you were in the hospital after you are discharged, you can call the unit and asked to speak with the hospitalist on call if the hospitalist that took care of you is not available. Once you are discharged, your primary care physician will handle any further medical issues. Please note that NO REFILLS for any discharge medications will be authorized once you are discharged, as it is imperative that you return to your primary care physician (or establish a relationship with a primary care physician if you do not have one) for your aftercare needs so that they can reassess your need for medications and monitor your lab values.

## 2018-01-22 NOTE — Progress Notes (Signed)
Occupational Therapy Treatment Patient Details Name: Micheal Frank MRN: [id number] DOB: Mar 01, 1926 Today's Date: 01/22/2018    History of present illness Pt is a 84 y.o. male admitted after falling at home in which he sustained multiple L rib fractures. PMH including but not limited to CKD, COPD, HLD, L TKA in 2003 with revision in 2012.   OT comments  Pt demonstrating progress toward OT goals. He was able to complete LB dressing tasks with min assist and ambulate to bathroom for toileting tasks with fluctuating min guard to min assist. Pt requiring cues for safety and single step commands throughout. When standing at the sink for grooming tasks, pt requiring min assist for stability. Educated pt's daughter concerning need for pt to have hands on assistance at home. They report desire to take pt home with 24 hour assistance rather than SNF. Pt will need 24 hour hands on assistance and home health OT follow-up at home. Daughter reports understanding.    Follow Up Recommendations  SNF;Home health OT;Supervision/Assistance - 24 hour    Equipment Recommendations  3 in 1 bedside commode    Recommendations for Other Services      Precautions / Restrictions Precautions Precautions: Fall;Other (comment) Precaution Comments: monitor O2 sats Restrictions Weight Bearing Restrictions: No       Mobility Bed Mobility               General bed mobility comments: Pt was OOB in the recliner chair.   Transfers Overall transfer level: Needs assistance Equipment used: Rolling walker (2 wheeled) Transfers: Sit to/from Stand Sit to Stand: Min guard         General transfer comment: Min guard assist to power up to standing.     Balance Overall balance assessment: Needs assistance Sitting-balance support: Feet supported;Bilateral upper extremity supported Sitting balance-Leahy Scale: Fair     Standing balance support: Bilateral upper extremity supported Standing balance-Leahy  Scale: Poor Standing balance comment: Requires min assist when standing without UE support.                            ADL either performed or assessed with clinical judgement   ADL Overall ADL's : Needs assistance/impaired Eating/Feeding: Set up;Bed level   Grooming: Wash/dry hands;Minimal assistance;Standing Grooming Details (indicate cue type and reason): Assist for balance with pt demonstrating posterior and L lateral lean.              Lower Body Dressing: Minimal assistance;Sit to/from stand   Toilet Transfer: Minimal assistance;Ambulation;RW Toilet Transfer Details (indicate cue type and reason): At times min guard assist but pt with decreased stability and loss of balance requiring min assist. Cues for use of RW.  Toileting- Clothing Manipulation and Hygiene: Minimal assistance;Sit to/from stand Toileting - Clothing Manipulation Details (indicate cue type and reason): assist for balance     Functional mobility during ADLs: Minimal assistance;Rolling walker General ADL Comments: Educated pt and daughter concerning need for hands on assist during standing ADL for stability.     Vision   Vision Assessment?: No apparent visual deficits   Perception     Praxis      Cognition Arousal/Alertness: Awake/alert Behavior During Therapy: WFL for tasks assessed/performed Overall Cognitive Status: Within Functional Limits for tasks assessed Area of Impairment: Memory;Following commands;Problem solving                     Memory: Decreased short-term memory Following Commands: Follows one  step commands with increased time     Problem Solving: Slow processing;Decreased initiation;Difficulty sequencing;Requires verbal cues;Requires tactile cues General Comments: Pt requiring single step commands and increased processing time.         Exercises     Shoulder Instructions       General Comments      Pertinent Vitals/ Pain       Pain Assessment:  Faces Faces Pain Scale: Hurts a little bit Pain Location: Lt ribs/back Pain Descriptors / Indicators: Grimacing;Guarding Pain Intervention(s): Limited activity within patient's tolerance;Monitored during session;Repositioned  Home Living                                          Prior Functioning/Environment              Frequency  Min 2X/week        Progress Toward Goals  OT Goals(current goals can now be found in the care plan section)  Progress towards OT goals: Progressing toward goals  Acute Rehab OT Goals Patient Stated Goal: not stated OT Goal Formulation: With patient Time For Goal Achievement: 01/31/18 Potential to Achieve Goals: Fair  Plan Discharge plan remains appropriate;Discharge plan needs to be updated    Co-evaluation                 AM-PAC PT "6 Clicks" Daily Activity     Outcome Measure   Help from another person eating meals?: None Help from another person taking care of personal grooming?: A Little Help from another person toileting, which includes using toliet, bedpan, or urinal?: A Little Help from another person bathing (including washing, rinsing, drying)?: A Little Help from another person to put on and taking off regular upper body clothing?: A Little Help from another person to put on and taking off regular lower body clothing?: A Little 6 Click Score: 19    End of Session Equipment Utilized During Treatment: Gait belt;Rolling walker  OT Visit Diagnosis: Pain Pain - Right/Left: Left Pain - part of body: (trunk)   Activity Tolerance Patient tolerated treatment well   Patient Left in bed;with call bell/phone within reach;with bed alarm set   Nurse Communication Mobility status        Time: 7846-9629 OT Time Calculation (min): 25 min  Charges: OT General Charges $OT Visit: 1 Visit OT Treatments $Self Care/Home Management : 23-37 mins  Doristine Section, MS OTR/L  Pager: [phone]    Galan Ghee A  Jaymon Dudek 01/22/2018, 12:24 PM

## 2018-01-22 NOTE — Progress Notes (Signed)
RN paged MD again for mag citrate and Enema for pt to help him have a bowel movement

## 2018-01-22 NOTE — Progress Notes (Signed)
RN called SWOT Nurse to help discharge patient, Ginger, RN gave pt discharge instructions and prescriptions, pt daughter in the room, verbalized understadning. PT and daughter stated they have all equipment already at home. IV has been removed pt comfortable and has been wheeled downstairs

## 2018-01-23 ENCOUNTER — Other Ambulatory Visit: Payer: Self-pay | Admitting: Internal Medicine

## 2018-01-23 ENCOUNTER — Telehealth: Payer: Self-pay | Admitting: Internal Medicine

## 2018-01-23 ENCOUNTER — Telehealth: Payer: Self-pay

## 2018-01-23 DIAGNOSIS — M8008XS Age-related osteoporosis with current pathological fracture, vertebra(e), sequela: Secondary | ICD-10-CM

## 2018-01-23 DIAGNOSIS — L89899 Pressure ulcer of other site, unspecified stage: Secondary | ICD-10-CM

## 2018-01-23 DIAGNOSIS — R4182 Altered mental status, unspecified: Secondary | ICD-10-CM

## 2018-01-23 NOTE — Telephone Encounter (Signed)
See note dated 01/23/18

## 2018-01-23 NOTE — Telephone Encounter (Signed)
Copied from [place] [id number] [id number]. Topic: Quick Communication - See Telephone Encounter >> Jan 23, 2018  8:52 AM Gardiner Ramus wrote: CRM for notification. See Telephone encounter for: 01/23/18. Pt daughter called and stated that her dad was in the hospital and was admitted broke 3 ribs. Daughter states that his mind is not right. He has been up at nigh. Daughter states that the doctor at the hospital would not test for a uti. Pt was discharged 01/22/18. Daughter states that he needed a nurse and social worker to come out to the house and order pt according to the hospital. No orders never placed. Please advise Cb#[phone]

## 2018-01-23 NOTE — Telephone Encounter (Signed)
LMOM for Micheal Frank w/ verbal orders.

## 2018-01-23 NOTE — Telephone Encounter (Signed)
Please call the patient's daughter If the patient is severely confused or has fever, chills, needs to be seen ASAP here or at the ER depending on how severe the symptoms are.  He might be confused due to pain medication, according to the discharge summary he is taking oxycodone. Recommend to take Tylenol for now for the pain and hold oxycodone. Arrange TCM visit, again ASAP if needed.

## 2018-01-23 NOTE — Telephone Encounter (Signed)
Spoke w/ Maggie- verbal orders given.

## 2018-01-23 NOTE — Telephone Encounter (Signed)
01/23/18  Transition Care Management Follow-up Telephone Call  ADMISSION DATE: 01/16/18  DISCHARGE DATE: 01/22/18   How have you been since you were released from the hospital? Per daughter patient has had Halleucinations.   Do you understand why you were in the hospital? Yes per daughter.  Do you understand the discharge instrcutions? Yes per daughter.    Items Reviewed:  Medications reviewed: Yes, several that patient is not taking per daughter.  Allergies reviewed:Yes   Dietary changes reviewed: Heart healthy.   Referrals reviewed: Yes appointment scheduled and referral placed to [facility].   Functional Questionnaire:   Activities of Daily Living (ADLs): Patient has to have assistance with all at this time.  Any patient concerns? Daughter concerned about patients pressure sore and possible UTI   Confirmed importance and date/time of follow-up visits scheduled: Yes   Confirmed with patient if condition begins to worsen call PCP or go to the ER.  Yes    Patient was given the office number and encouragred to call back with questions or concerns. Yes

## 2018-01-23 NOTE — Telephone Encounter (Signed)
Called patients daughter per Dr. Larose Kells. States patient has been Hallucinating for the past 4 days and has not been taking Oxycodone that was prescribed. Patient had diarrhea last night. Daughter states patient has pressure sore at top of buttock. Would like Home Health Nurse to visit patient. Patient has no fever or chills  at this time.  Per Dr. Larose Kells ok to schedule patient for Hospital follow up visit. Ok to refer to [facility]. Referral for [name] placed in system.   Patient's daughter will bring in urine specimen to appointment tomorrow. Doesn't feel patient would be able to go  "on demand".  Would like to rule out UTI because of patients confusion.

## 2018-01-23 NOTE — Telephone Encounter (Signed)
Needing VO for SN to start tomorrow morning if received today to assess and teach for pressure ulcers.

## 2018-01-23 NOTE — Telephone Encounter (Signed)
Please advise 

## 2018-01-23 NOTE — Telephone Encounter (Signed)
Copied from [place] [id number]. Topic: General - Other >> Jan 23, 2018  3:34 PM Judyann Munson wrote: Reason for CRM:  Orland Mustard from [facility] . Called and requested verbal orders for physical therapy  5 times a week, 2x a week. best contact number is [phone]. Please advise

## 2018-01-23 NOTE — Telephone Encounter (Signed)
Daughter calling again, requesting call back asap

## 2018-01-24 ENCOUNTER — Ambulatory Visit: Payer: Medicare Other | Admitting: Internal Medicine

## 2018-01-24 ENCOUNTER — Encounter: Payer: Self-pay | Admitting: Internal Medicine

## 2018-01-24 ENCOUNTER — Ambulatory Visit (HOSPITAL_BASED_OUTPATIENT_CLINIC_OR_DEPARTMENT_OTHER)
Admission: RE | Admit: 2018-01-24 | Discharge: 2018-01-24 | Disposition: A | Discharge status: Home / Self Care | Payer: Medicare Other | Source: Ambulatory Visit | Attending: Internal Medicine | Admitting: Internal Medicine

## 2018-01-24 VITALS — BP 136/70 | HR 74 | Temp 98.2°F | Resp 16 | Ht 70.0 in | Wt 148.0 lb

## 2018-01-24 DIAGNOSIS — J449 Chronic obstructive pulmonary disease, unspecified: Secondary | ICD-10-CM

## 2018-01-24 DIAGNOSIS — R918 Other nonspecific abnormal finding of lung field: Secondary | ICD-10-CM | POA: Diagnosis not present

## 2018-01-24 DIAGNOSIS — R0902 Hypoxemia: Secondary | ICD-10-CM

## 2018-01-24 DIAGNOSIS — J439 Emphysema, unspecified: Secondary | ICD-10-CM | POA: Diagnosis not present

## 2018-01-24 DIAGNOSIS — I10 Essential (primary) hypertension: Secondary | ICD-10-CM

## 2018-01-24 DIAGNOSIS — S2242XD Multiple fractures of ribs, left side, subsequent encounter for fracture with routine healing: Secondary | ICD-10-CM

## 2018-01-24 DIAGNOSIS — M25462 Effusion, left knee: Secondary | ICD-10-CM

## 2018-01-24 DIAGNOSIS — X58XXXD Exposure to other specified factors, subsequent encounter: Secondary | ICD-10-CM | POA: Diagnosis not present

## 2018-01-24 DIAGNOSIS — R443 Hallucinations, unspecified: Secondary | ICD-10-CM

## 2018-01-24 LAB — COMPREHENSIVE METABOLIC PANEL
ALT: 11 U/L (ref 0–53)
AST: 23 U/L (ref 0–37)
Albumin: 3.3 g/dL — ABNORMAL LOW (ref 3.5–5.2)
Alkaline Phosphatase: 131 U/L — ABNORMAL HIGH (ref 39–117)
BUN: 26 mg/dL — AB (ref 6–23)
CO2: 29 meq/L (ref 19–32)
CREATININE: 1.42 mg/dL (ref 0.40–1.50)
Calcium: 8.3 mg/dL — ABNORMAL LOW (ref 8.4–10.5)
Chloride: 104 mEq/L (ref 96–112)
GFR: 49.73 mL/min — ABNORMAL LOW (ref >60.00)
GLUCOSE: 98 mg/dL (ref 70–99)
Potassium: 4.4 mEq/L (ref 3.5–5.1)
SODIUM: 143 meq/L (ref 135–145)
Total Bilirubin: 0.7 mg/dL (ref 0.2–1.2)
Total Protein: 6.1 g/dL (ref 6.0–8.3)

## 2018-01-24 LAB — LIPID PANEL
CHOL/HDL RATIO: 3
Cholesterol: 95 mg/dL (ref 0–200)
HDL: 33.9 mg/dL — ABNORMAL LOW (ref >39.00)
LDL CALC: 42 mg/dL (ref 0–99)
NONHDL: 61.07
TRIGLYCERIDES: 95 mg/dL (ref 0.0–149.0)
VLDL: 19 mg/dL (ref 0.0–40.0)

## 2018-01-24 LAB — URINALYSIS, ROUTINE W REFLEX MICROSCOPIC
Bilirubin Urine: NEGATIVE
Hgb urine dipstick: NEGATIVE
Nitrite: POSITIVE — AB
PH: 7.5 (ref 5.0–8.0)
SPECIFIC GRAVITY, URINE: 1.015 (ref 1.000–1.030)
TOTAL PROTEIN, URINE-UPE24: 30 — AB
UROBILINOGEN UA: 0.2 (ref 0.0–1.0)
Urine Glucose: NEGATIVE

## 2018-01-24 NOTE — Progress Notes (Signed)
Subjective:    Patient ID: Micheal Frank, male    DOB: 27-Apr-1926, 84 y.o.   MRN: [id number]  DOS:  01/24/2018 Type of visit - description :    TCM Interval history: Admitted to the hospital 01/16/2018, discharge 01/22/2018 He presented to the ER after mechanical fall, subsequently went home but returned the next day to the ER because pain was not well controlled. He had 2 L rib fractures and a normal CT head. At  the hospital, saw PT, was treated with aspirin, Robaxin and oxycodone. He was noted to be hypoxic, rx O2 until July 8 per daughter.  He has a history of sleep apnea, untreated, refused CPAP Was discharged on Robaxin and oxycodone as needed. Also Tylenol and Voltaren patch. PT recommended SNF but the family elected home for now. Constipation was treated with MiraLAX and Senokot  Wt Readings from Last 3 Encounters:  01/24/18 148 lb (67.1 kg)  01/16/18 148 lb (67.1 kg)  01/15/18 148 lb (67.1 kg)     Review of Systems He is here with his daughter for follow-up. She reported hallucinations that started in the hospital.  When he went back home he continue with hallucinations, was somewhat combative.  Fortunately symptoms are much improved for the last 48 hours. Because of that she strongly requests a UA and urine culture. Pain control: Currently doing well with a NSAID patch and Tylenol, has not use narcotics or muscle relaxants. Has developed a bedsore, the daughter reports that was not present before the admission. Also, she just noted left knee effusion yesterday, she suspect is due to the recent fall because he has some scratches there but is  concerned because he had septic knee before.  She is also concerned about the patient being off diuretics  Appetite is normal, no fever chills, no nausea or vomiting.  He had some loose stools, daughter reports that he got apparently a laxative before he left the hospital 3 days ago. No LUTS symptoms.   Past Medical History:    Diagnosis Date  . Arthritis    left leg also has swelling  . Asthma   . Blood dyscrasia    pt CAN NOT have any blood thinners d/t hx brain bleed  . Chronic kidney disease, stage III (moderate) ([place]) 01/20/2013  . Complication of anesthesia    confusion with anesthesia  . Constipation    miralax prn  . COPD (chronic obstructive pulmonary disease) ([facility])    emphysema  . Depression    takes wellbutrin bid  . Epidural abscess   . Gastric ulcer   . History of blood clots   . Hyperlipidemia   . Melanoma ([place])   . Peripheral neuropathy   . Pneumonia    as a child and in 1952;had a pneumonia vaccine couple of years ago  . Prosthetic joint infection ([place])   . S/P right heart catheterization    at least 42yrs  . Shingles   . Short-term memory loss   . Shortness of breath    with exertion  . Sleep apnea    has CPAP but doesn't use it;sleep study done at least 30yrs ago  . Staphylococcus aureus bacteremia with sepsis ([place])   . Subdural hematoma ([place]) 03/30/11  . Urinary frequency    wears depends    Past Surgical History:  Procedure Laterality Date  . epidural abscess drainage  12/2010  . 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  EYE SURGERY     bil cataract surgery  . FILTERING PROCEDURE     filter placed in neck d/t blood  clot  . HERNIA REPAIR     25+yrs ago  . KNEE ARTHROSCOPY    . SKIN BIOPSY     melanoma on head   . TOTAL KNEE ARTHROPLASTY     x 2 left knee 2003/2012  . TOTAL KNEE REVISION  05/23/2011   Procedure: TOTAL KNEE REVISION;  Surgeon: Meredith Pel;  Location: [facility];  Service: Orthopedics;  Laterality: Left;  LEFT KNEE REMOVAL OF SPACER, POSSIBLE REIMPLANTATION OF REVISION TKA VERSES REPLACEMENT ANTIBIOTIC SPACER    Social History   Socioeconomic History  . Marital status: Widowed    Spouse name: Not on file  . Number of children: 3  . Years of education: Not on file  . Highest education level: Not on file  Occupational  History  . Occupation: retired  Scientific laboratory technician  . Financial resource strain: Not on file  . Food insecurity:    Worry: Not on file    Inability: Not on file  . Transportation needs:    Medical: Not on file    Non-medical: Not on file  Tobacco Use  . Smoking status: Former Smoker    Packs/day: 2.00    Years: 40.00    Pack years: 80.00    Types: Cigarettes    Last attempt to quit: 07/17/1978    Years since quitting: 39.5  . Smokeless tobacco: Never Used  Substance and Sexual Activity  . Alcohol use: No  . Drug use: No  . Sexual activity: Not on file  Lifestyle  . Physical activity:    Days per week: Not on file    Minutes per session: Not on file  . Stress: Not on file  Relationships  . Social connections:    Talks on phone: Not on file    Gets together: Not on file    Attends religious service: Not on file    Active member of club or organization: Not on file    Attends meetings of clubs or organizations: Not on file    Relationship status: Not on file  . Intimate partner violence:    Fear of current or ex partner: Not on file    Emotionally abused: Not on file    Physically abused: Not on file    Forced sexual activity: Not on file  Other Topics Concern  . Not on file  Social History Narrative   Lost wife 06-2013   Lives in his house, 1 daughter and her 2 adult children live w/ him (1g-child w/ special needs)      Allergies as of 01/24/2018      Reactions   Anticoagulant Compound    History of bleeding on the brain per daughter   Protonix [pantoprazole Sodium] Diarrhea      Medication List        Accurate as of 01/24/18  1:34 PM. Always use your most recent med list.          acetaminophen 500 MG tablet Commonly known as:  TYLENOL Take 2 tablets (1,000 mg total) by mouth every 8 (eight) hours as needed for mild pain.   albuterol 108 (90 Base) MCG/ACT inhaler Commonly known as:  PROAIR HFA Inhale 2 puffs into the lungs every 6 (six) hours as needed for  wheezing.   AZO CRANBERRY URINARY TRACT 250-60 MG Caps Generic drug:  Cranberry-Vitamin C Take 1  tablet by mouth daily.   benzonatate 200 MG capsule Commonly known as:  TESSALON TAKE ONE CAPSULE 3-4 TIMES DAILY AS NEEDED FOR COUGH   budesonide-formoterol 160-4.5 MCG/ACT inhaler Commonly known as:  SYMBICORT Inhale 2 puffs into the lungs 2 (two) times daily.   buPROPion 200 MG 12 hr tablet Commonly known as:  WELLBUTRIN SR Take 1 tablet (200 mg total) by mouth 2 (two) times daily.   calcium carbonate 600 MG Tabs tablet Commonly known as:  OS-CAL Take 600 mg by mouth daily.   diazepam 5 MG tablet Commonly known as:  VALIUM Take 1 tablet (5 mg total) by mouth every 6 (six) hours as needed for anxiety.   diclofenac 1.3 % Ptch Commonly known as:  FLECTOR Place 1 patch onto the skin 2 (two) times daily.   doxazosin 4 MG tablet Commonly known as:  CARDURA Take 1 tablet (4 mg total) by mouth at bedtime.   fluticasone 50 MCG/ACT nasal spray Commonly known as:  FLONASE Place 2 sprays into both nostrils 2 (two) times daily.   meclizine 25 MG tablet Commonly known as:  ANTIVERT Take 1 tablet (25 mg total) by mouth daily as needed (vertigo).   montelukast 10 MG tablet Commonly known as:  SINGULAIR Take 1 tablet (10 mg total) by mouth daily.   multivitamin with minerals Tabs tablet Take 1 tablet by mouth daily.   polyethylene glycol packet Commonly known as:  MIRALAX / GLYCOLAX Take 17 g by mouth daily as needed for mild constipation.   PROBIOTIC PO Take 1 capsule by mouth daily.   ranitidine 300 MG tablet Commonly known as:  ZANTAC Take 1 tablet (300 mg total) by mouth at bedtime.   rOPINIRole 1 MG tablet Commonly known as:  REQUIP Take 1 tablet (1 mg total) by mouth 2 (two) times daily.   saccharomyces boulardii 250 MG capsule Commonly known as:  FLORASTOR Take 1 capsule (250 mg total) by mouth 2 (two) times daily.   senna-docusate 8.6-50 MG tablet Commonly  known as:  Senokot-S Take 1 tablet by mouth 2 (two) times daily.   simvastatin 40 MG tablet Commonly known as:  ZOCOR Take 1 tablet (40 mg total) by mouth daily.   tamsulosin 0.4 MG Caps capsule Commonly known as:  FLOMAX Take 1 capsule (0.4 mg total) by mouth daily.            Durable Medical Equipment  (From admission, onward)        Start     Ordered   01/24/18 0000  For home use only DME oxygen    Question Answer Comment  Mode or (Route) Nasal cannula   Liters per Minute 2   Frequency Continuous (stationary and portable oxygen unit needed)   Oxygen conserving device Yes   Oxygen delivery system Gas      01/24/18 1129         Objective:   Physical Exam  Musculoskeletal:       Back:   BP 136/70 (BP Location: Left Arm, Patient Position: Sitting, Cuff Size: Small)   Pulse 74   Temp 98.2 F (36.8 C) (Oral)   Resp 16   Ht 5\' 10"  (1.778 m)   Wt 148 lb (67.1 kg)   SpO2 (!) 87%   BMI 21.24 kg/m  General:   Well developed, NAD, elderly gentleman, sitting in a wheelchair, he follows simple commands HEENT:  Normocephalic .  Lungs:  decreased breath sounds but clear Normal respiratory effort, no intercostal  retractions, no accessory muscle use. Heart: RRR,  no murmur.  No pretibial edema bilaterally  Skin: See graphic Neurologic:  alert & oriented X3.  Speech normal, gait not tested, sits in a wheelchair. Psych--  No anxious or depressed appearing.      Assessment & Plan:   Assessment   COPD- Dr Melvyn Novas 08-2015, stable, RTC PRN CKD -----Creatinine 1.67, GFR 32 Hyperlipidemia Depression Dyspepsia: Chronic, declined a GI referral in 2017 after sxs decreased with PPIs empirically NEURO: --Decreased memory --Anisocoria- L pupil larger ,s/p surgery --Neuropathy --H/o Mnire's  -- on diuretics , valium, meclizine Osteoporosis -- h/o pelvic Fx, dexa April 2016: T score (-) 2.3;  rx fosamax 03-2015, dc after 3 months d/t dysphagia; prolia #1 05-03-16 DJD   -- hydrocodone rarely RLS OSA dx remotely, used a CPAP, wt loss and cpap d/c ~ 2012 (Dr Maxwell Caul) GU: --H/o incontinence, saw Dr Karsten Ro, OV ~ 2013 --Urinary retention 02-2016: sepsis, admitted, had a foley temporarily  H/o melanoma H/o subdural hematoma, epidural abscess, staph aureus  sepsis H/o Prosthetic joint infection H/o Blood clots H/o gastric ulcer C diff dx 10-19-15, s/p flagyl x 2 , diarrhea again 03-2016, better after vancomycin. Coordinate care with his daughter [name]  [phone]  PLAN:  TCM Fall, to rib fractures, left knee effusion.  Pain currently well controlled on Tylenol, Voltaren patch.  Not taking pain medication or muscle relaxants, will discontinue them from the medication list Hallucinations:  Likely sundowning, back to normal in the last 48 hours, no fever chills.  Recommend observation, daughter strongly request UA and urine culture despite my explanation that that could lead to unnecessary antibiotics.  Will check a culture at her request. Hospital recommend CBC, CMP and chest x-ray.  Will do. Knee swelling, effusion, Will refer to Dr. Marlou Sa for evaluation of the knee, doubt septic.  Likely hemorrhagic. Hypoxia: History of COPD, controlled with medication, also  H/o  OSA which is untreated, he remains hypoxic today at rest.  Prescribed oxygen. Pressure ulcer: Continue with local care, frequent turning, sheep skin.  Call if not better.  RN is visiting him today, call if further instructions are needed. HTN: Hospital held [facility] due to acceptable BPs without it.  Recommend to monitor BPs, restart Maxide if appropriate although daughter reports that he is taking it for Meniers DZ. Mnire's: See above, restart Maxide if Mnire symptoms resurface. RTC  4 weeks > 45 min

## 2018-01-24 NOTE — Progress Notes (Signed)
Pre visit review using our clinic review tool, if applicable. No additional management support is needed unless otherwise documented below in the visit note. 

## 2018-01-24 NOTE — Patient Instructions (Addendum)
GO TO THE LAB : Get the blood work    GO TO THE FRONT DESK Schedule your next appointment for a checkup in 1 month  STOP BY THE FIRST FLOOR:  get the XR   Do not take Robaxin or OxyContin  Not taking Maxide, a diuretic.  Okay not to take it as long as blood pressures are okay.  Check BPs daily, this should be between 110 and 145.  For the pressure ulcer:  Continue covering it with the big Band-Aid, call for refills if needed. Frequent turning Ship skin Ask RN to eval day area in case she has any suggestion Look for signs of infection

## 2018-01-24 NOTE — Assessment & Plan Note (Signed)
TCM Fall, to rib fractures, left knee effusion.  Pain currently well controlled on Tylenol, Voltaren patch.  Not taking pain medication or muscle relaxants, will discontinue them from the medication list Hallucinations:  Likely sundowning, back to normal in the last 48 hours, no fever chills.  Recommend observation, daughter strongly request UA and urine culture despite my explanation that that could lead to unnecessary antibiotics.  Will check a culture at her request. Hospital recommend CBC, CMP and chest x-ray.  Will do. Knee swelling, effusion, Will refer to Dr. Marlou Sa for evaluation of the knee, doubt septic.  Likely hemorrhagic. Hypoxia: History of COPD, controlled with medication, also  H/o  OSA which is untreated, he remains hypoxic today at rest.  Prescribed oxygen. Pressure ulcer: Continue with local care, frequent turning, sheep skin.  Call if not better.  RN is visiting him today, call if further instructions are needed. HTN: Hospital held [facility] due to acceptable BPs without it.  Recommend to monitor BPs, restart Maxide if appropriate although daughter reports that he is taking it for Meniers DZ. Mnire's: See above, restart Maxide if Mnire symptoms resurface. RTC  4 weeks

## 2018-01-25 ENCOUNTER — Telehealth: Payer: Self-pay | Admitting: Internal Medicine

## 2018-01-25 ENCOUNTER — Ambulatory Visit (INDEPENDENT_AMBULATORY_CARE_PROVIDER_SITE_OTHER): Payer: Self-pay

## 2018-01-25 ENCOUNTER — Encounter (INDEPENDENT_AMBULATORY_CARE_PROVIDER_SITE_OTHER): Payer: Self-pay | Admitting: Orthopedic Surgery

## 2018-01-25 ENCOUNTER — Telehealth: Payer: Self-pay

## 2018-01-25 ENCOUNTER — Ambulatory Visit (INDEPENDENT_AMBULATORY_CARE_PROVIDER_SITE_OTHER): Payer: Medicare Other | Admitting: Orthopedic Surgery

## 2018-01-25 DIAGNOSIS — M25562 Pain in left knee: Secondary | ICD-10-CM

## 2018-01-25 MED ORDER — LEVOFLOXACIN 500 MG PO TABS: 500.0000 mg | ORAL_TABLET | Freq: Every day | ORAL | 0 refills | Status: DC

## 2018-01-25 NOTE — Telephone Encounter (Signed)
Copied from [place] [phone]. Topic: Quick Communication - Lab Results >> Jan 25, 2018  8:03 AM Synthia Innocent wrote: Requesting lab results

## 2018-01-25 NOTE — Telephone Encounter (Signed)
Requesting results. We are unable to add on CBC.

## 2018-01-25 NOTE — Telephone Encounter (Signed)
Copied from [place] [id number]. Topic: Quick Communication - See Telephone Encounter >> Jan 25, 2018 10:36 AM Nils Flack wrote: CRM for notification. See Telephone encounter for: 01/25/18. Daughter lynn called - she wants to confirm that Dr Larose Kells still wants to have tp on oxygen.  They are calling her asking about delivery.   Please call back as soon as you can [phone]

## 2018-01-25 NOTE — Telephone Encounter (Signed)
Rx sent 

## 2018-01-25 NOTE — Telephone Encounter (Signed)
Spoke w/ Ulice Dash- verbal orders given.

## 2018-01-25 NOTE — Telephone Encounter (Signed)
Please advise 

## 2018-01-25 NOTE — Telephone Encounter (Signed)
Agree with all recommendations.

## 2018-01-25 NOTE — Telephone Encounter (Signed)
Copied from [facility] [id number]. Topic: General - Other >> Jan 25, 2018  8:30 AM Margot Ables wrote: Reason for CRM: VO request for SN for wound care 1x week starting 01/24/18, 2x week for 8 weeks, clean w/cleanser, apply A&D ointment, app Allevyn dressing. Recommend egg crate foam in the bed and in chair when sitting. Please call to advise. Secure VM so ok to leave detailed msg is he is not able to answer.

## 2018-01-25 NOTE — Telephone Encounter (Signed)
Spoke w/ [name], informed that PCP is wanting Pt to see have O2. [name] verbalized understanding.

## 2018-01-25 NOTE — Telephone Encounter (Signed)
Chest x-ray showed bilateral infiltrates, I discussed results with radiology, the ones on the right seems new compared to last x-ray and on the left if they seem to increase. CMP is okay, I failed to order CBC as planned.  UA showed bacteria, UCX pending Results discussed with the patient's daughter, today he remains well with no fever, chills, he does have some cough. Plan: Treat pneumonia with Levaquin 500 mg x 1 week, daughter in agreement.  Please send the prescription.

## 2018-01-27 ENCOUNTER — Encounter (INDEPENDENT_AMBULATORY_CARE_PROVIDER_SITE_OTHER): Payer: Self-pay | Admitting: Orthopedic Surgery

## 2018-01-27 LAB — URINE CULTURE
MICRO NUMBER: 90822565
SPECIMEN QUALITY: ADEQUATE

## 2018-01-27 NOTE — Progress Notes (Signed)
Office Visit Note   Patient: Micheal Frank           Date of Birth: 1926-03-30           MRN: [id number] Visit Date: 01/25/2018 Requested by: Colon Branch, [address] [place], [place] [address] PCP: Colon Branch, MD  Subjective: Chief Complaint  Patient presents with  . Left Knee - Pain    HPI: Seven is a patient with left knee pain.  Had a fall 2 weeks ago.  This was more of an impact injury.  Date of injury 01/15/2018.  Had left revision total knee replacement 7 years ago.  He was recently discharged from the hospital with pneumonia.  He is not currently taking any blood thinners.  He is not currently on antibiotics but that is being worked on at the time of this clinic visit.  He has been weightbearing with a walker              ROS: All systems reviewed are negative as they relate to the chief complaint within the history of present illness.  Patient denies  fevers or chills.   Assessment & Plan: Visit Diagnoses:  1. Acute pain of left knee     Plan: Pression is left knee pain with some bruising around the fibular head.  It looks like there is a fibular head fracture consistent with impact injury to the left knee.  Bruising and ecchymosis is present in this area.  Patient has good ankle dorsiflexion.  At this time I would say okay for him to continue weightbearing as tolerated.  This should be a self-limited injury and there does not appear to be a problem with either femoral or tibial prosthesis.  Follow-Up Instructions: Return if symptoms worsen or fail to improve.   Orders:  Orders Placed This Encounter  Procedures  . XR Knee Complete 4 Views Left   No orders of the defined types were placed in this encounter.     Procedures: No procedures performed   Clinical Data: No additional findings.  Objective: Vital Signs: There were no vitals taken for this visit.  Physical Exam:   Constitutional: Patient appears well-developed HEENT:  Head:  Normocephalic Eyes:EOM are normal Neck: Normal range of motion Cardiovascular: Normal rate Pulmonary/chest: Effort normal Neurologic: Patient is alert Skin: Skin is warm Psychiatric: Patient has normal mood and affect    Ortho Exam: Ortho exam demonstrates some bruising and ecchymosis around the fibular head.  Pedal pulses palpable.  There is no real effusion.  Collateral ligaments feel stable to varus and valgus stress at 0 and 30 degrees.  Extensor mechanism is intact.  No groin pain with internal/external rotation of that left leg.  No other masses lymphadenopathy or skin changes noted in that left knee region  Specialty Comments:  No specialty comments available.  Imaging: No results found.   PMFS History: Patient Active Problem List   Diagnosis Date Noted  . Multiple rib fractures 01/17/2018  . Fall 01/16/2018  . Essential hypertension 01/16/2018  . BPH (benign prostatic hyperplasia) 01/16/2018  . Multiple closed fractures of ribs of left side   . CAP (community acquired pneumonia) 06/25/2017  . UTI (urinary tract infection) 06/24/2017  . Sepsis ([facility]) 06/24/2017  . Compression fracture of T12 vertebra ([facility]) 06/24/2017  . Generalized weakness 06/24/2017  . Presence of left artificial knee joint 07/24/2016  . Chronic pain of left knee 07/24/2016  . Chronic midline low  back pain without sciatica 07/24/2016  . Depression 07/09/2016  . C. difficile diarrhea 05/04/2016  . Chronic cough 08/16/2015  . PCP NOTES >>>>>> 04/06/2015  . CTS (carpal tunnel syndrome) 11/16/2014  . Hyperlipidemia 08/17/2014  . Superficial bruising of chest wall 07/29/2014  . Urgency incontinence 01/23/2013  . Chronic kidney disease, stage III (moderate) ([facility]) 01/20/2013  . At high risk for falls 11/28/2012  . ALLERGIC RHINITIS 12/09/2009  . Carcinoma in situ of skin 04/30/2008  . RESTLESS LEG SYNDROME 04/30/2008  . NEUROPATHY 04/30/2008  . GLAUCOMA, BORDERLINE 04/30/2008  . Meniere's disease  04/30/2008  . HEARING LOSS 04/30/2008  . COPD GOLD II  04/30/2008  . DJD (degenerative joint disease) 04/30/2008  . Osteoporosis 04/30/2008  . Sleep apnea 04/30/2008  . HISTORY OF ASBESTOS EXPOSURE 04/30/2008   Past Medical History:  Diagnosis Date  . Arthritis    left leg also has swelling  . Asthma   . Blood dyscrasia    pt CAN NOT have any blood thinners d/t hx brain bleed  . Chronic kidney disease, stage III (moderate) ([facility]) 01/20/2013  . Complication of anesthesia    confusion with anesthesia  . Constipation    miralax prn  . COPD (chronic obstructive pulmonary disease) ([facility])    emphysema  . Depression    takes wellbutrin bid  . Epidural abscess   . Gastric ulcer   . History of blood clots   . Hyperlipidemia   . Melanoma ([facility])   . Peripheral neuropathy   . Pneumonia    as a child and in 1952;had a pneumonia vaccine couple of years ago  . Prosthetic joint infection ([facility])   . S/P right heart catheterization    at least 67yrs  . Shingles   . Short-term memory loss   . Shortness of breath    with exertion  . Sleep apnea    has CPAP but doesn't use it;sleep study done at least 44yrs ago  . Staphylococcus aureus bacteremia with sepsis ([facility])   . Subdural hematoma ([facility]) 03/30/11  . Urinary frequency    wears depends    Family History  Problem Relation Age of Onset  . Melanoma Brother   . CAD Neg Hx   . Diabetes Neg Hx     Past Surgical History:  Procedure Laterality Date  . epidural abscess drainage  12/2010  . 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 EYE SURGERY     bil cataract surgery  . FILTERING PROCEDURE     filter placed in neck d/t blood  clot  . HERNIA REPAIR     25+yrs ago  . KNEE ARTHROSCOPY    . SKIN BIOPSY     melanoma on head   . TOTAL KNEE ARTHROPLASTY     x 2 left knee 2003/2012  . TOTAL KNEE REVISION  05/23/2011   Procedure: TOTAL KNEE REVISION;  Surgeon: Meredith Pel;  Location: [facility];  Service:  Orthopedics;  Laterality: Left;  LEFT KNEE REMOVAL OF SPACER, POSSIBLE REIMPLANTATION OF REVISION TKA VERSES REPLACEMENT ANTIBIOTIC SPACER   Social History   Occupational History  . Occupation: retired  Tobacco Use  . Smoking status: Former Smoker    Packs/day: 2.00    Years: 40.00    Pack years: 80.00    Types: Cigarettes    Last attempt to quit: 07/17/1978    Years since quitting: 39.5  . Smokeless tobacco: Never Used  Substance  and Sexual Activity  . Alcohol use: No  . Drug use: No  . Sexual activity: Not on file

## 2018-01-28 ENCOUNTER — Telehealth: Payer: Self-pay | Admitting: *Deleted

## 2018-01-28 NOTE — Telephone Encounter (Signed)
Received Physician Orders from [facility]; forwarded to provider/SLS 07/15

## 2018-01-28 NOTE — Telephone Encounter (Signed)
Form signed and faxed to [facility] at [phone]. Form sent for scanning.  

## 2018-02-04 ENCOUNTER — Other Ambulatory Visit: Payer: Self-pay | Admitting: Internal Medicine

## 2018-02-04 ENCOUNTER — Ambulatory Visit (INDEPENDENT_AMBULATORY_CARE_PROVIDER_SITE_OTHER): Payer: Medicare Other | Admitting: Orthopedic Surgery

## 2018-02-05 ENCOUNTER — Encounter: Payer: Self-pay | Admitting: Internal Medicine

## 2018-02-06 ENCOUNTER — Telehealth: Payer: Self-pay | Admitting: *Deleted

## 2018-02-06 NOTE — Telephone Encounter (Signed)
Received Physician Orders from [facility]; forwarded to provider/SLS 07/24

## 2018-02-11 ENCOUNTER — Encounter: Payer: Self-pay | Admitting: Internal Medicine

## 2018-02-11 NOTE — Telephone Encounter (Signed)
Plan of care signed and faxed back to [facility] at [phone]. Form sent for scanning.

## 2018-02-12 ENCOUNTER — Encounter: Payer: Self-pay | Admitting: Internal Medicine

## 2018-02-12 ENCOUNTER — Other Ambulatory Visit: Payer: Self-pay | Admitting: Internal Medicine

## 2018-02-12 MED ORDER — TRIAMTERENE-HCTZ 37.5-25 MG PO TABS: ORAL_TABLET | ORAL | 1 refills | Status: DC

## 2018-02-25 ENCOUNTER — Other Ambulatory Visit: Payer: Self-pay | Admitting: Internal Medicine

## 2018-02-26 ENCOUNTER — Ambulatory Visit: Payer: Medicare Other | Admitting: Internal Medicine

## 2018-03-05 ENCOUNTER — Ambulatory Visit: Payer: Medicare Other | Admitting: Internal Medicine

## 2018-03-05 ENCOUNTER — Telehealth: Payer: Self-pay

## 2018-03-05 ENCOUNTER — Encounter: Payer: Self-pay | Admitting: Internal Medicine

## 2018-03-05 VITALS — BP 136/70 | HR 77 | Temp 97.6°F | Resp 16 | Ht 66.0 in | Wt 148.0 lb

## 2018-03-05 DIAGNOSIS — R443 Hallucinations, unspecified: Secondary | ICD-10-CM

## 2018-03-05 DIAGNOSIS — S2242XD Multiple fractures of ribs, left side, subsequent encounter for fracture with routine healing: Secondary | ICD-10-CM

## 2018-03-05 DIAGNOSIS — M25462 Effusion, left knee: Secondary | ICD-10-CM | POA: Diagnosis not present

## 2018-03-05 DIAGNOSIS — H8109 Meniere's disease, unspecified ear: Secondary | ICD-10-CM | POA: Diagnosis not present

## 2018-03-05 NOTE — Telephone Encounter (Signed)
Called [facility] at [facility] [place] and his daughter requesting to cancel home health visits at this time.

## 2018-03-05 NOTE — Progress Notes (Signed)
Subjective:    Patient ID: Micheal Frank, male    DOB: September 17, 1925, 84 y.o.   MRN: [id number]  DOS:  03/05/2018 Type of visit - description : f/u  Interval history: See  last visit Urine culture was negative, chest x-ray showed new infiltrates, he was prescribed Levaquin x1 week. Also, oxygen was removed,  HCTZ for Meniers restarted. Overall feeling great.  He is here with his daughter  Review of Systems No further falls. Denies fever, chills, no hallucinations. Cough he is back to baseline. He has bedsores, one has improved the other has not.  Past Medical History:  Diagnosis Date  . Arthritis    left leg also has swelling  . Asthma   . Blood dyscrasia    pt CAN NOT have any blood thinners d/t hx brain bleed  . Chronic kidney disease, stage III (moderate) ([facility]) 01/20/2013  . Complication of anesthesia    confusion with anesthesia  . Constipation    miralax prn  . COPD (chronic obstructive pulmonary disease) ([facility])    emphysema  . Depression    takes wellbutrin bid  . Epidural abscess   . Gastric ulcer   . History of blood clots   . Hyperlipidemia   . Melanoma ([facility])   . Peripheral neuropathy   . Pneumonia    as a child and in 1952;had a pneumonia vaccine couple of years ago  . Prosthetic joint infection ([facility])   . S/P right heart catheterization    at least 73yrs  . Shingles   . Short-term memory loss   . Shortness of breath    with exertion  . Sleep apnea    has CPAP but doesn't use it;sleep study done at least 37yrs ago  . Staphylococcus aureus bacteremia with sepsis ([facility])   . Subdural hematoma ([facility]) 03/30/11  . Urinary frequency    wears depends    Past Surgical History:  Procedure Laterality Date  . epidural abscess drainage  12/2010  . 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 EYE SURGERY     bil cataract surgery  . FILTERING PROCEDURE     filter placed in neck d/t blood  clot  . HERNIA REPAIR     25+yrs ago  . KNEE  ARTHROSCOPY    . SKIN BIOPSY     melanoma on head   . TOTAL KNEE ARTHROPLASTY     x 2 left knee 2003/2012  . TOTAL KNEE REVISION  05/23/2011   Procedure: TOTAL KNEE REVISION;  Surgeon: Meredith Pel;  Location: [facility];  Service: Orthopedics;  Laterality: Left;  LEFT KNEE REMOVAL OF SPACER, POSSIBLE REIMPLANTATION OF REVISION TKA VERSES REPLACEMENT ANTIBIOTIC SPACER    Social History   Socioeconomic History  . Marital status: Widowed    Spouse name: Not on file  . Number of children: 3  . Years of education: Not on file  . Highest education level: Not on file  Occupational History  . Occupation: retired  Scientific laboratory technician  . Financial resource strain: Not on file  . Food insecurity:    Worry: Not on file    Inability: Not on file  . Transportation needs:    Medical: Not on file    Non-medical: Not on file  Tobacco Use  . Smoking status: Former Smoker    Packs/day: 2.00    Years: 40.00    Pack years: 80.00    Types: Cigarettes  Last attempt to quit: 07/17/1978    Years since quitting: 39.6  . Smokeless tobacco: Never Used  Substance and Sexual Activity  . Alcohol use: No  . Drug use: No  . Sexual activity: Not on file  Lifestyle  . Physical activity:    Days per week: Not on file    Minutes per session: Not on file  . Stress: Not on file  Relationships  . Social connections:    Talks on phone: Not on file    Gets together: Not on file    Attends religious service: Not on file    Active member of club or organization: Not on file    Attends meetings of clubs or organizations: Not on file    Relationship status: Not on file  . Intimate partner violence:    Fear of current or ex partner: Not on file    Emotionally abused: Not on file    Physically abused: Not on file    Forced sexual activity: Not on file  Other Topics Concern  . Not on file  Social History Narrative   Lost wife 06-2013   Lives in his house, 1 daughter and her 2 adult children live w/ him  (1g-child w/ special needs)      Allergies as of 03/05/2018      Reactions   Anticoagulant Compound    History of bleeding on the brain per daughter   Protonix [pantoprazole Sodium] Diarrhea      Medication List        Accurate as of 03/05/18  1:10 PM. Always use your most recent med list.          acetaminophen 500 MG tablet Commonly known as:  TYLENOL Take 2 tablets (1,000 mg total) by mouth every 8 (eight) hours as needed for mild pain.   albuterol 108 (90 Base) MCG/ACT inhaler Commonly known as:  PROVENTIL HFA;VENTOLIN HFA Inhale 2 puffs into the lungs every 6 (six) hours as needed for wheezing.   benzonatate 200 MG capsule Commonly known as:  TESSALON TAKE ONE CAPSULE 3-4 TIMES DAILY AS NEEDED FOR COUGH   budesonide-formoterol 160-4.5 MCG/ACT inhaler Commonly known as:  SYMBICORT Inhale 2 puffs into the lungs 2 (two) times daily.   buPROPion 200 MG 12 hr tablet Commonly known as:  WELLBUTRIN SR Take 1 tablet (200 mg total) by mouth 2 (two) times daily.   calcium carbonate 600 MG Tabs tablet Commonly known as:  OS-CAL Take 600 mg by mouth daily.   diazepam 5 MG tablet Commonly known as:  VALIUM Take 1 tablet (5 mg total) by mouth every 6 (six) hours as needed for anxiety.   doxazosin 4 MG tablet Commonly known as:  CARDURA Take 1 tablet (4 mg total) by mouth at bedtime.   fluticasone 50 MCG/ACT nasal spray Commonly known as:  FLONASE Place 2 sprays into both nostrils 2 (two) times daily.   levofloxacin 500 MG tablet Commonly known as:  LEVAQUIN Take 1 tablet (500 mg total) by mouth daily.   meclizine 25 MG tablet Commonly known as:  ANTIVERT Take 1 tablet (25 mg total) by mouth daily as needed (vertigo).   montelukast 10 MG tablet Commonly known as:  SINGULAIR Take 1 tablet (10 mg total) by mouth daily.   multivitamin with minerals Tabs tablet Take 1 tablet by mouth daily.   ranitidine 300 MG tablet Commonly known as:  ZANTAC Take 1 tablet  (300 mg total) by mouth at bedtime.  rOPINIRole 1 MG tablet Commonly known as:  REQUIP Take 1 tablet (1 mg total) by mouth 2 (two) times daily.   senna-docusate 8.6-50 MG tablet Commonly known as:  Senokot-S Take 1 tablet by mouth 2 (two) times daily.   simvastatin 40 MG tablet Commonly known as:  ZOCOR Take 1 tablet (40 mg total) by mouth daily.   tamsulosin 0.4 MG Caps capsule Commonly known as:  FLOMAX Take 1 capsule (0.4 mg total) by mouth daily.   triamterene-hydrochlorothiazide 37.5-25 MG tablet Commonly known as:  MAXZIDE-25 Take 1/2 tablet by mouth daily          Objective:   Physical Exam BP 136/70 (BP Location: Right Arm, Cuff Size: Normal)   Pulse 77   Temp 97.6 F (36.4 C) (Oral)   Resp 16   Ht 5\' 6"  (1.676 m)   Wt 148 lb (67.1 kg)   SpO2 93%   BMI 23.89 kg/m   General:   Well developed, NAD, elderly gentleman. HEENT:  Normocephalic .  Lungs:  decreased breath sounds but clear Normal respiratory effort, no intercostal retractions, no accessory muscle use. Heart: RRR,  no murmur.  No pretibial edema bilaterally  Skin: See graphic Neurologic:  alert & oriented X3.  Speech normal, gait: Assisted by a rolling walker Psych--  No anxious or depressed appearing.        Assessment & Plan:    Assessment   COPD- Dr Melvyn Novas 08-2015, stable, RTC PRN CKD -----Creatinine 1.67, GFR 32 Hyperlipidemia Depression Dyspepsia: Chronic, declined a GI referral in 2017 after sxs decreased with PPIs empirically NEURO: --Decreased memory --Anisocoria- L pupil larger ,s/p surgery --Neuropathy --H/o Mnire's  -- on diuretics , valium, meclizine Osteoporosis -- h/o pelvic Fx, dexa April 2016: T score (-) 2.3;  rx fosamax 03-2015, dc after 3 months d/t dysphagia; prolia #1 05-03-16 DJD  -- hydrocodone rarely RLS OSA dx remotely, used a CPAP, wt loss and cpap d/c ~ 2012 (Dr Maxwell Caul) GU: --H/o incontinence, saw Dr Karsten Ro, OV ~ 2013 --Urinary retention 02-2016:  sepsis, admitted, had a foley temporarily  H/o melanoma H/o subdural hematoma, epidural abscess, staph aureus  sepsis H/o Prosthetic joint infection H/o Blood clots H/o gastric ulcer C diff dx 10-19-15, s/p flagyl x 2 , diarrhea again 03-2016, better after vancomycin. Coordinate care with his daughter [name]  [phone]  PLAN:  Fall, rib fractures, left knee effusion: Currently doing well, not taking the Voltaren patch, s/p ortho OV, effusion resolved. Hallucinations: See last visit, urine culture was negative, chest x-ray show a new infiltrate, s/p levaquin, now asymptomatic Hypoxia: No longer a problem, oxygen was stopped Pressure ulcer.  The one at the right side is still present, see picture, although there is some induration around it, I do not see a obvious infection.  We agreed to continue local care, will call in 10 days if he is not improving for a wound care center referral, rec again to get a sheep skin; will cancel the reminding of a home health care visits as they are simply changing the covers. Mnire's disease: Back on HCTZ as needed only Labs: Last alkaline phosphatase slightly elevated at 131, reassess in few months. Osteoporosis: On Prolia, next due in few months. RTC 3 months

## 2018-03-05 NOTE — Progress Notes (Signed)
Pre visit review using our clinic review tool, if applicable. No additional management support is needed unless otherwise documented below in the visit note. 

## 2018-03-05 NOTE — Patient Instructions (Signed)
   GO TO THE FRONT DESK Schedule your next appointment for a   checkup in 3 months   

## 2018-03-05 NOTE — Assessment & Plan Note (Signed)
Fall, rib fractures, left knee effusion: Currently doing well, not taking the Voltaren patch, s/p ortho OV, effusion resolved. Hallucinations: See last visit, urine culture was negative, chest x-ray show a new infiltrate, s/p levaquin, now asymptomatic Hypoxia: No longer a problem, oxygen was stopped Pressure ulcer.  The one at the right side is still present, see picture, although there is some induration around it, I do not see a obvious infection.  We agreed to continue local care, will call in 10 days if he is not improving for a wound care center referral, rec again to get a sheep skin; will cancel the reminding of a home health care visits as they are simply changing the covers. Mnire's disease: Back on HCTZ as needed only Labs: Last alkaline phosphatase slightly elevated at 131, reassess in few months. Osteoporosis: On Prolia, next due in few months. RTC 3 months

## 2018-04-01 ENCOUNTER — Other Ambulatory Visit: Payer: Self-pay | Admitting: Internal Medicine

## 2018-06-05 ENCOUNTER — Ambulatory Visit: Payer: Medicare Other | Admitting: Internal Medicine

## 2018-06-05 ENCOUNTER — Encounter: Payer: Self-pay | Admitting: Internal Medicine

## 2018-06-05 VITALS — BP 126/76 | HR 71 | Temp 98.1°F | Resp 16 | Ht 66.0 in | Wt 159.0 lb

## 2018-06-05 DIAGNOSIS — J449 Chronic obstructive pulmonary disease, unspecified: Secondary | ICD-10-CM

## 2018-06-05 DIAGNOSIS — E038 Other specified hypothyroidism: Secondary | ICD-10-CM

## 2018-06-05 DIAGNOSIS — R443 Hallucinations, unspecified: Secondary | ICD-10-CM

## 2018-06-05 DIAGNOSIS — E039 Hypothyroidism, unspecified: Secondary | ICD-10-CM

## 2018-06-05 LAB — TSH: TSH: 2.77 u[IU]/mL (ref 0.35–4.50)

## 2018-06-05 LAB — T4, FREE: FREE T4: 0.87 ng/dL (ref 0.60–1.60)

## 2018-06-05 LAB — T3, FREE: T3, Free: 2.4 pg/mL (ref 2.3–4.2)

## 2018-06-05 MED ORDER — ROPINIROLE HCL 1 MG PO TABS: 1.0000 mg | ORAL_TABLET | Freq: Three times a day (TID) | ORAL | 1 refills | Status: DC

## 2018-06-05 MED ORDER — RANITIDINE HCL 300 MG PO TABS: 300.0000 mg | ORAL_TABLET | Freq: Every day | ORAL | 3 refills | Status: DC

## 2018-06-05 MED ORDER — BUPROPION HCL ER (SR) 200 MG PO TB12: 200.0000 mg | ORAL_TABLET | Freq: Two times a day (BID) | ORAL | 1 refills | Status: DC

## 2018-06-05 MED ORDER — TAMSULOSIN HCL 0.4 MG PO CAPS: 0.4000 mg | ORAL_CAPSULE | Freq: Every day | ORAL | 1 refills | Status: DC

## 2018-06-05 MED ORDER — DOXAZOSIN MESYLATE 4 MG PO TABS: 4.0000 mg | ORAL_TABLET | Freq: Every day | ORAL | 1 refills | Status: DC

## 2018-06-05 MED ORDER — SIMVASTATIN 40 MG PO TABS: 40.0000 mg | ORAL_TABLET | Freq: Every day | ORAL | 1 refills | Status: DC

## 2018-06-05 NOTE — Progress Notes (Signed)
Subjective:    Patient ID: Micheal Frank, male    DOB: Oct 22, 1925, 84 y.o.   MRN: [id number]  DOS:  06/05/2018 Type of visit - description : rov Here with his daughter. Since the last time, he went to the [place] to have a complete blood work, was told everything was good. We discussed multiple issues Previously had a pressure ulcer: Resolved Mental status changes, hallucinations: Resolved RLS: Described involuntary movements of his lower extremities, he calls it RLS, the daughter has never witness any involuntary movements.  Reports that the symptoms are slightly worse lately and noticeable even during the daytime. No recent falls COPD: O2 sat in the mid 90s consistently, DOE at baseline    Review of Systems   Past Medical History:  Diagnosis Date  . Arthritis    left leg also has swelling  . Asthma   . Blood dyscrasia    pt CAN NOT have any blood thinners d/t hx brain bleed  . Chronic kidney disease, stage III (moderate) ([place]) 01/20/2013  . Complication of anesthesia    confusion with anesthesia  . Constipation    miralax prn  . COPD (chronic obstructive pulmonary disease) ([facility])    emphysema  . Depression    takes wellbutrin bid  . Epidural abscess   . Gastric ulcer   . History of blood clots   . Hyperlipidemia   . Melanoma ([place])   . Peripheral neuropathy   . Pneumonia    as a child and in 1952;had a pneumonia vaccine couple of years ago  . Prosthetic joint infection ([place])   . S/P right heart catheterization    at least 65yrs  . Shingles   . Short-term memory loss   . Shortness of breath    with exertion  . Sleep apnea    has CPAP but doesn't use it;sleep study done at least 22yrs ago  . Staphylococcus aureus bacteremia with sepsis ([place])   . Subdural hematoma ([place]) 03/30/11  . Urinary frequency    wears depends    Past Surgical History:  Procedure Laterality Date  . epidural abscess drainage  12/2010  . EXTERNAL EAR SURGERY     shunt placed in right ear d/t  mennieres   . EYE SURGERY     bil cataract surgery  . FILTERING PROCEDURE     filter placed in neck d/t blood  clot  . HERNIA REPAIR     25+yrs ago  . KNEE ARTHROSCOPY    . SKIN BIOPSY     melanoma on head   . TOTAL KNEE ARTHROPLASTY     x 2 left knee 2003/2012  . TOTAL KNEE REVISION  05/23/2011   Procedure: TOTAL KNEE REVISION;  Surgeon: Meredith Pel;  Location: [facility];  Service: Orthopedics;  Laterality: Left;  LEFT KNEE REMOVAL OF SPACER, POSSIBLE REIMPLANTATION OF REVISION TKA VERSES REPLACEMENT ANTIBIOTIC SPACER    Social History   Socioeconomic History  . Marital status: Widowed    Spouse name: Not on file  . Number of children: 3  . Years of education: Not on file  . Highest education level: Not on file  Occupational History  . Occupation: retired  Scientific laboratory technician  . Financial resource strain: Not on file  . Food insecurity:    Worry: Not on file    Inability: Not on file  . Transportation needs:    Medical: Not on file    Non-medical: Not on file  Tobacco Use  .  Smoking status: Former Smoker    Packs/day: 2.00    Years: 40.00    Pack years: 80.00    Types: Cigarettes    Last attempt to quit: 07/17/1978    Years since quitting: 39.9  . Smokeless tobacco: Never Used  Substance and Sexual Activity  . Alcohol use: No  . Drug use: No  . Sexual activity: Not Currently  Lifestyle  . Physical activity:    Days per week: Not on file    Minutes per session: Not on file  . Stress: Not on file  Relationships  . Social connections:    Talks on phone: Not on file    Gets together: Not on file    Attends religious service: Not on file    Active member of club or organization: Not on file    Attends meetings of clubs or organizations: Not on file    Relationship status: Not on file  . Intimate partner violence:    Fear of current or ex partner: Not on file    Emotionally abused: Not on file    Physically abused: Not on file    Forced sexual activity: Not on file   Other Topics Concern  . Not on file  Social History Narrative   Lost wife 06-2013   Lives in his house, 1 daughter and her 2 adult children live w/ him (1g-child w/ special needs)      Allergies as of 06/05/2018      Reactions   Anticoagulant Compound    History of bleeding on the brain per daughter   Protonix [pantoprazole Sodium] Diarrhea      Medication List        Accurate as of 06/05/18  1:03 PM. Always use your most recent med list.          acetaminophen 500 MG tablet Commonly known as:  TYLENOL Take 2 tablets (1,000 mg total) by mouth every 8 (eight) hours as needed for mild pain.   albuterol 108 (90 Base) MCG/ACT inhaler Commonly known as:  PROVENTIL HFA;VENTOLIN HFA Inhale 2 puffs into the lungs every 6 (six) hours as needed for wheezing.   benzonatate 200 MG capsule Commonly known as:  TESSALON TAKE ONE CAPSULE 3-4 TIMES DAILY AS NEEDED FOR COUGH   budesonide-formoterol 160-4.5 MCG/ACT inhaler Commonly known as:  SYMBICORT Inhale 2 puffs into the lungs 2 (two) times daily.   buPROPion 200 MG 12 hr tablet Commonly known as:  WELLBUTRIN SR Take 1 tablet (200 mg total) by mouth 2 (two) times daily.   calcium carbonate 600 MG Tabs tablet Commonly known as:  OS-CAL Take 600 mg by mouth daily.   diazepam 5 MG tablet Commonly known as:  VALIUM Take 1 tablet (5 mg total) by mouth every 6 (six) hours as needed for anxiety.   doxazosin 4 MG tablet Commonly known as:  CARDURA Take 1 tablet (4 mg total) by mouth at bedtime.   fluticasone 50 MCG/ACT nasal spray Commonly known as:  FLONASE Place 2 sprays into both nostrils 2 (two) times daily.   meclizine 25 MG tablet Commonly known as:  ANTIVERT Take 1 tablet (25 mg total) by mouth daily as needed (vertigo).   montelukast 10 MG tablet Commonly known as:  SINGULAIR Take 1 tablet (10 mg total) by mouth daily.   multivitamin with minerals Tabs tablet Take 1 tablet by mouth daily.   ranitidine 300 MG  tablet Commonly known as:  ZANTAC Take 1 tablet (300 mg  total) by mouth at bedtime.   rOPINIRole 1 MG tablet Commonly known as:  REQUIP Take 1 tablet (1 mg total) by mouth 2 (two) times daily.   senna-docusate 8.6-50 MG tablet Commonly known as:  Senokot-S Take 1 tablet by mouth 2 (two) times daily.   simvastatin 40 MG tablet Commonly known as:  ZOCOR Take 1 tablet (40 mg total) by mouth daily.   tamsulosin 0.4 MG Caps capsule Commonly known as:  FLOMAX Take 1 capsule (0.4 mg total) by mouth daily.   triamterene-hydrochlorothiazide 37.5-25 MG tablet Commonly known as:  MAXZIDE-25 Take 1/2 tablet by mouth daily           Objective:   Physical Exam BP 126/76 (BP Location: Left Arm, Patient Position: Sitting, Cuff Size: Small)   Pulse 71   Temp 98.1 F (36.7 C) (Oral)   Resp 16   Ht 5\' 6"  (1.676 m)   Wt 159 lb (72.1 kg)   SpO2 90%   BMI 25.66 kg/m     General:   Well developed, NAD, BMI noted.  Reading his newspaper HEENT:  Normocephalic . Face symmetric, atraumatic Lungs:  Decreased breath sounds.  No increased work of breathing. Heart: RRR,  no murmur.  No pretibial edema bilaterally  Skin: Not pale. Not jaundice Neurologic:  alert & oriented X to self, time, place   Speech normal, gait not tested, sitting in a wheelchair, in no distress. Psych--  Cognition and judgment appear intact.  Cooperative with normal attention span and concentration.  Behavior appropriate. No anxious or depressed appearing.   Assessment & Plan:     Assessment   COPD- Dr Melvyn Novas 08-2015, stable, RTC PRN CKD -----Creatinine 1.67, GFR 32 Hyperlipidemia Depression, anxiety  Dyspepsia: Chronic, declined a GI referral in 2017 after sxs decreased with PPIs empirically NEURO: --Decreased memory --Anisocoria- L pupil larger ,s/p surgery --Neuropathy --H/o Mnire's  -- on diuretics , valium, meclizine Osteoporosis -- h/o pelvic Fx, dexa April 2016: T score (-) 2.3;  rx fosamax  03-2015, dc after 3 months d/t dysphagia; prolia #1 05-03-16 DJD  -- hydrocodone rarely RLS OSA dx remotely, used a CPAP, wt loss and cpap d/c ~ 2012 (Dr Maxwell Caul) GU: --H/o incontinence, saw Dr Karsten Ro, OV ~ 2013 --Urinary retention 02-2016: sepsis, admitted, had a foley temporarily  H/o melanoma H/o subdural hematoma, epidural abscess, staph aureus  sepsis H/o Prosthetic joint infection H/o Blood clots H/o gastric ulcer C diff dx 10-19-15, s/p flagyl x 2 , diarrhea again 03-2016, better after vancomycin. Coordinate care with his daughter [name]  [phone]  PLAN:  --No further falls or   hallucinations Pressure ulcer: Resolved R LS: Symptoms are somewhat atypical, described as involuntary movements, denies hand tremors, movements not witnessed by his daughter.  For now, will increase Requip from 1 mg B.I.D. to 3 times daily.  If no better let me know COPD: Doing great, O2 sat in the mid 90s, good compliance with medication. Increased TSH: Recheck today Preventive care Had a flu shot, declined Shingrix, had a number of labs done at the [place] reportedly normal. Multiple refills  he is doing well, RTC 4 to 5 months

## 2018-06-05 NOTE — Progress Notes (Signed)
Pre visit review using our clinic review tool, if applicable. No additional management support is needed unless otherwise documented below in the visit note. 

## 2018-06-05 NOTE — Patient Instructions (Addendum)
Please schedule Medicare Wellness with Glenard Haring.   GO TO THE LAB : Get the blood work     GO TO THE FRONT DESK Schedule your next appointment for a  Check up in 4-5 months   Increase Requip to 1 tablet 3 times a day If your restless leg is not improving gradually let me know

## 2018-06-06 NOTE — Assessment & Plan Note (Signed)
--  No further falls or   hallucinations Pressure ulcer: Resolved R LS: Symptoms are somewhat atypical, described as involuntary movements, denies hand tremors, movements not witnessed by his daughter.  For now, will increase Requip from 1 mg B.I.D. to 3 times daily.  If no better let me know COPD: Doing great, O2 sat in the mid 90s, good compliance with medication. Increased TSH: Recheck today Preventive care Had a flu shot, declined Shingrix, had a number of labs done at the [place] reportedly normal. Multiple refills  he is doing well, RTC 4 to 5 months

## 2018-06-19 ENCOUNTER — Telehealth: Payer: Self-pay | Admitting: Internal Medicine

## 2018-06-19 MED ORDER — FAMOTIDINE 10 MG PO TABS: 10.0000 mg | ORAL_TABLET | Freq: Two times a day (BID) | ORAL | 6 refills | Status: AC

## 2018-06-19 NOTE — Telephone Encounter (Signed)
Copied from [place] [id number]. Topic: Quick Communication - See Telephone Encounter >> Jun 19, 2018 11:53 AM Percell Belt A wrote: CRM for notification. See Telephone encounter for: 06/19/18.  Pt daughter called and stated that the ranitidine (ZANTAC) 300 MG tablet [[id number]]  has been recalled and they would like to know what Dr Larose Kells suggest instead   Best call number -[place], [address] [phone] (Phone)

## 2018-06-19 NOTE — Telephone Encounter (Signed)
Recommend Pepcid 10 mg 1 p.o. twice daily #60 and 6 refills

## 2018-06-19 NOTE — Telephone Encounter (Signed)
Spoke w/ [name]- Pt's daughter- informed of recommendations. She verbalized understanding. Rx sent- [name] to let us know if 10mg  bid doesn't help w/ symptoms.

## 2018-06-19 NOTE — Telephone Encounter (Signed)
Please advise 

## 2018-07-24 ENCOUNTER — Telehealth: Payer: Self-pay

## 2018-07-24 NOTE — Telephone Encounter (Signed)
Prolia-- insurance verification requested.

## 2018-07-26 NOTE — Telephone Encounter (Signed)
PA form completed and faxed to [facility]- awaiting determination.

## 2018-07-26 NOTE — Telephone Encounter (Signed)
Prolia benefits received PA is required 20% Prolia $25 admin fee  Patient may owe approximately $60 OOP  Patient over due for Prolia

## 2018-07-29 NOTE — Telephone Encounter (Signed)
[employer] calling to request additional clinical info, because the PA form submitted on was not the correct form. They need this info by 4:30 pm today, so calling will be faster. They will the refax the form as well.  Call back number [phone]  Option 5 Need member ID #

## 2018-07-29 NOTE — Telephone Encounter (Signed)
Spoke w/ Hassan Rowan at [facility], questions answered, she has forwarded to clinical team for review. Awaiting determination.

## 2018-07-30 NOTE — Telephone Encounter (Signed)
Letter mailed to Pt informing he is overdue for Prolia- instructed to call office and schedule nurse visit. Prolia in fridge.

## 2018-07-30 NOTE — Telephone Encounter (Signed)
PA approved through 07/27/2019.

## 2018-08-15 ENCOUNTER — Encounter: Payer: Self-pay | Admitting: Internal Medicine

## 2018-08-15 ENCOUNTER — Other Ambulatory Visit: Payer: Self-pay | Admitting: Internal Medicine

## 2018-08-15 DIAGNOSIS — H8109 Meniere's disease, unspecified ear: Secondary | ICD-10-CM

## 2018-08-15 MED ORDER — DIAZEPAM 5 MG PO TABS: 5.0000 mg | ORAL_TABLET | Freq: Four times a day (QID) | ORAL | 2 refills | Status: AC | PRN

## 2018-08-15 MED ORDER — TRIAMTERENE-HCTZ 37.5-25 MG PO TABS: ORAL_TABLET | ORAL | 1 refills | Status: DC

## 2018-08-15 MED ORDER — MECLIZINE HCL 25 MG PO TABS: 25.0000 mg | ORAL_TABLET | Freq: Every day | ORAL | 2 refills | Status: AC | PRN

## 2018-09-04 ENCOUNTER — Ambulatory Visit: Payer: Medicare Other | Admitting: Internal Medicine

## 2018-09-04 ENCOUNTER — Other Ambulatory Visit (INDEPENDENT_AMBULATORY_CARE_PROVIDER_SITE_OTHER): Payer: Medicare Other

## 2018-09-04 DIAGNOSIS — H8109 Meniere's disease, unspecified ear: Secondary | ICD-10-CM

## 2018-09-04 LAB — BASIC METABOLIC PANEL
BUN: 27 mg/dL — AB (ref 6–23)
CALCIUM: 9.1 mg/dL (ref 8.4–10.5)
CHLORIDE: 104 meq/L (ref 96–112)
CO2: 29 mEq/L (ref 19–32)
CREATININE: 1.65 mg/dL — AB (ref 0.40–1.50)
GFR: 39.29 mL/min — ABNORMAL LOW (ref >60.00)
Glucose, Bld: 64 mg/dL — ABNORMAL LOW (ref 70–99)
Potassium: 4 mEq/L (ref 3.5–5.1)
Sodium: 141 mEq/L (ref 135–145)

## 2018-10-16 ENCOUNTER — Emergency Department (HOSPITAL_BASED_OUTPATIENT_CLINIC_OR_DEPARTMENT_OTHER): Payer: Medicare Other

## 2018-10-16 ENCOUNTER — Telehealth: Payer: Self-pay

## 2018-10-16 ENCOUNTER — Emergency Department (HOSPITAL_BASED_OUTPATIENT_CLINIC_OR_DEPARTMENT_OTHER)
Admission: EM | Admit: 2018-10-16 | Discharge: 2018-10-16 | Disposition: A | Discharge status: Home / Self Care | Payer: Medicare Other | Attending: Emergency Medicine | Admitting: Emergency Medicine

## 2018-10-16 ENCOUNTER — Telehealth: Payer: Self-pay | Admitting: Internal Medicine

## 2018-10-16 ENCOUNTER — Ambulatory Visit (INDEPENDENT_AMBULATORY_CARE_PROVIDER_SITE_OTHER): Payer: Medicare Other | Admitting: Internal Medicine

## 2018-10-16 ENCOUNTER — Other Ambulatory Visit: Payer: Self-pay

## 2018-10-16 DIAGNOSIS — J449 Chronic obstructive pulmonary disease, unspecified: Secondary | ICD-10-CM | POA: Insufficient documentation

## 2018-10-16 DIAGNOSIS — R05 Cough: Secondary | ICD-10-CM | POA: Insufficient documentation

## 2018-10-16 DIAGNOSIS — Z96652 Presence of left artificial knee joint: Secondary | ICD-10-CM | POA: Diagnosis not present

## 2018-10-16 DIAGNOSIS — Z87891 Personal history of nicotine dependence: Secondary | ICD-10-CM | POA: Insufficient documentation

## 2018-10-16 DIAGNOSIS — R300 Dysuria: Secondary | ICD-10-CM | POA: Diagnosis present

## 2018-10-16 DIAGNOSIS — N390 Urinary tract infection, site not specified: Secondary | ICD-10-CM | POA: Diagnosis not present

## 2018-10-16 DIAGNOSIS — R350 Frequency of micturition: Secondary | ICD-10-CM | POA: Insufficient documentation

## 2018-10-16 DIAGNOSIS — N3000 Acute cystitis without hematuria: Secondary | ICD-10-CM | POA: Insufficient documentation

## 2018-10-16 DIAGNOSIS — R059 Cough, unspecified: Secondary | ICD-10-CM

## 2018-10-16 LAB — COMPREHENSIVE METABOLIC PANEL
ALT: 9 U/L (ref 0–44)
AST: 21 U/L (ref 15–41)
Albumin: 3.6 g/dL (ref 3.5–5.0)
Alkaline Phosphatase: 71 U/L (ref 38–126)
Anion gap: 8 (ref 5–15)
BUN: 24 mg/dL — ABNORMAL HIGH (ref 8–23)
CO2: 26 mmol/L (ref 22–32)
Calcium: 9.2 mg/dL (ref 8.9–10.3)
Chloride: 104 mmol/L (ref 98–111)
Creatinine, Ser: 1.52 mg/dL — ABNORMAL HIGH (ref 0.61–1.24)
GFR calc Af Amer: 46 mL/min — ABNORMAL LOW (ref >60)
GFR calc non Af Amer: 39 mL/min — ABNORMAL LOW (ref >60)
Glucose, Bld: 65 mg/dL — ABNORMAL LOW (ref 70–99)
Potassium: 3.6 mmol/L (ref 3.5–5.1)
Sodium: 138 mmol/L (ref 135–145)
Total Bilirubin: 0.8 mg/dL (ref 0.3–1.2)
Total Protein: 6.6 g/dL (ref 6.5–8.1)

## 2018-10-16 LAB — URINALYSIS, ROUTINE W REFLEX MICROSCOPIC
Bilirubin Urine: NEGATIVE
Glucose, UA: NEGATIVE mg/dL
Hgb urine dipstick: NEGATIVE
Ketones, ur: 15 mg/dL — AB
Nitrite: POSITIVE — AB
Protein, ur: NEGATIVE mg/dL
Specific Gravity, Urine: 1.015 (ref 1.005–1.030)
pH: 7.5 (ref 5.0–8.0)

## 2018-10-16 LAB — CBC WITH DIFFERENTIAL/PLATELET
Abs Immature Granulocytes: 0.04 10*3/uL (ref 0.00–0.07)
Basophils Absolute: 0.1 10*3/uL (ref 0.0–0.1)
Basophils Relative: 0 %
Eosinophils Absolute: 0.1 10*3/uL (ref 0.0–0.5)
Eosinophils Relative: 1 %
HCT: 44.6 % (ref 39.0–52.0)
Hemoglobin: 14.2 g/dL (ref 13.0–17.0)
Immature Granulocytes: 0 %
Lymphocytes Relative: 12 %
Lymphs Abs: 1.4 10*3/uL (ref 0.7–4.0)
MCH: 30.9 pg (ref 26.0–34.0)
MCHC: 31.8 g/dL (ref 30.0–36.0)
MCV: 97.2 fL (ref 80.0–100.0)
Monocytes Absolute: 1.3 10*3/uL — ABNORMAL HIGH (ref 0.1–1.0)
Monocytes Relative: 12 %
Neutro Abs: 8.4 10*3/uL — ABNORMAL HIGH (ref 1.7–7.7)
Neutrophils Relative %: 75 %
Platelets: 280 10*3/uL (ref 150–400)
RBC: 4.59 MIL/uL (ref 4.22–5.81)
RDW: 13.1 % (ref 11.5–15.5)
WBC: 11.3 10*3/uL — ABNORMAL HIGH (ref 4.0–10.5)
nRBC: 0 % (ref 0.0–0.2)

## 2018-10-16 LAB — URINALYSIS, MICROSCOPIC (REFLEX): WBC, UA: >50 WBC/hpf (ref 0–5)

## 2018-10-16 LAB — TROPONIN I: Troponin I: <0.03 ng/mL (ref <0.03)

## 2018-10-16 MED ORDER — CEPHALEXIN 500 MG PO CAPS: 500.0000 mg | ORAL_CAPSULE | Freq: Four times a day (QID) | ORAL | 0 refills | Status: AC

## 2018-10-16 MED ORDER — SODIUM CHLORIDE 0.9 % IV SOLN
1.0000 g | Freq: Once | INTRAVENOUS | Status: AC
  Administered 2018-10-16: 14:00:00 1 g via INTRAVENOUS
  Filled 2018-10-16: qty 10

## 2018-10-16 MED FILL — CEPHALEXIN 500 MG CAPSULE: 500 | 7 days supply | Qty: 28 | Fill #0

## 2018-10-16 NOTE — Progress Notes (Signed)
Subjective:    Patient ID: Micheal Frank, male    DOB: 03-10-1926, 85 y.o.   MRN: [id number]  DOS:  10/16/2018 Type of visit - description: Virtual Visit via Video Note  I connected with@ on 10/16/18 at 10:20 AM EDT by a video enabled telemedicine application and verified that I am speaking with the correct person using two identifiers.   THIS ENCOUNTER IS A VIRTUAL VISIT DUE TO COVID-19 - PATIENT WAS NOT SEEN IN THE OFFICE. PATIENT HAS CONSENTED TO VIRTUAL VISIT / TELEMEDICINE VISIT   Location of patient: home  Location of provider: home-office  I discussed the limitations of evaluation and management by telemedicine and the availability of in person appointments. The patient expressed understanding and agreed to proceed.  History of Present Illness: I interviewed the patient and his daughter Adela Glimpse via Biomedical engineer. The patient is not feeling well for the last couple of days, has decreased appetite and decrease oral intake. Last night, he was awake by urinating with dysuria.   The color of the urine was normal. The daughter has at home test for UTI and it came back +.   Review of Systems He has been feeling chills, his temperature was 98.1. Denies any known coronavirus contacts. Denies chest pain. He has mild shortness of breath, mild cough, both level baseline. No abdominal pain, vomiting or diarrhea No myalgias. Denies difficulty urinating  Past Medical History:  Diagnosis Date  . Arthritis    left leg also has swelling  . Asthma   . Blood dyscrasia    pt CAN NOT have any blood thinners d/t hx brain bleed  . Chronic kidney disease, stage III (moderate) ([place]) 01/20/2013  . Complication of anesthesia    confusion with anesthesia  . Constipation    miralax prn  . COPD (chronic obstructive pulmonary disease) ([facility])    emphysema  . Depression    takes wellbutrin bid  . Epidural abscess   . Gastric ulcer   . History of blood clots   . Hyperlipidemia   . Melanoma  ([place])   . Peripheral neuropathy   . Pneumonia    as a child and in 1952;had a pneumonia vaccine couple of years ago  . Prosthetic joint infection ([place])   . S/P right heart catheterization    at least 22yrs  . Shingles   . Short-term memory loss   . Shortness of breath    with exertion  . Sleep apnea    has CPAP but doesn't use it;sleep study done at least 45yrs ago  . Staphylococcus aureus bacteremia with sepsis ([place])   . Subdural hematoma ([place]) 03/30/11  . Urinary frequency    wears depends    Past Surgical History:  Procedure Laterality Date  . epidural abscess drainage  12/2010  . 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 EYE SURGERY     bil cataract surgery  . FILTERING PROCEDURE     filter placed in neck d/t blood  clot  . HERNIA REPAIR     25+yrs ago  . KNEE ARTHROSCOPY    . SKIN BIOPSY     melanoma on head   . TOTAL KNEE ARTHROPLASTY     x 2 left knee 2003/2012  . TOTAL KNEE REVISION  05/23/2011   Procedure: TOTAL KNEE REVISION;  Surgeon: Meredith Pel;  Location: [facility];  Service: Orthopedics;  Laterality: Left;  LEFT KNEE REMOVAL OF SPACER, POSSIBLE  REIMPLANTATION OF REVISION TKA VERSES REPLACEMENT ANTIBIOTIC SPACER    Social History   Socioeconomic History  . Marital status: Widowed    Spouse name: Not on file  . Number of children: 3  . Years of education: Not on file  . Highest education level: Not on file  Occupational History  . Occupation: retired  Scientific laboratory technician  . Financial resource strain: Not on file  . Food insecurity:    Worry: Not on file    Inability: Not on file  . Transportation needs:    Medical: Not on file    Non-medical: Not on file  Tobacco Use  . Smoking status: Former Smoker    Packs/day: 2.00    Years: 40.00    Pack years: 80.00    Types: Cigarettes    Last attempt to quit: 07/17/1978    Years since quitting: 40.2  . Smokeless tobacco: Never Used  Substance and Sexual Activity  . Alcohol use: No   . Drug use: No  . Sexual activity: Not Currently  Lifestyle  . Physical activity:    Days per week: Not on file    Minutes per session: Not on file  . Stress: Not on file  Relationships  . Social connections:    Talks on phone: Not on file    Gets together: Not on file    Attends religious service: Not on file    Active member of club or organization: Not on file    Attends meetings of clubs or organizations: Not on file    Relationship status: Not on file  . Intimate partner violence:    Fear of current or ex partner: Not on file    Emotionally abused: Not on file    Physically abused: Not on file    Forced sexual activity: Not on file  Other Topics Concern  . Not on file  Social History Narrative   Lost wife 06-2013   Lives in his house, 1 daughter and her 2 adult children live w/ him (1g-child w/ special needs)      Allergies as of 10/16/2018      Reactions   Anticoagulant Compound    History of bleeding on the brain per daughter   Protonix [pantoprazole Sodium] Diarrhea      Medication List       Accurate as of October 16, 2018  8:31 PM. Always use your most recent med list.        acetaminophen 500 MG tablet Commonly known as:  TYLENOL Take 2 tablets (1,000 mg total) by mouth every 8 (eight) hours as needed for mild pain.   albuterol 108 (90 Base) MCG/ACT inhaler Commonly known as:  ProAir HFA Inhale 2 puffs into the lungs every 6 (six) hours as needed for wheezing.   benzonatate 200 MG capsule Commonly known as:  TESSALON TAKE ONE CAPSULE 3-4 TIMES DAILY AS NEEDED FOR COUGH   budesonide-formoterol 160-4.5 MCG/ACT inhaler Commonly known as:  SYMBICORT Inhale 2 puffs into the lungs 2 (two) times daily.   buPROPion 200 MG 12 hr tablet Commonly known as:  WELLBUTRIN SR Take 1 tablet (200 mg total) by mouth 2 (two) times daily.   calcium carbonate 600 MG Tabs tablet Commonly known as:  OS-CAL Take 600 mg by mouth daily.   cephALEXin 500 MG capsule  Commonly known as:  KEFLEX Take 1 capsule (500 mg total) by mouth 4 (four) times daily for 7 days.   diazepam 5 MG tablet Commonly  known as:  VALIUM Take 1 tablet (5 mg total) by mouth every 6 (six) hours as needed for anxiety.   doxazosin 4 MG tablet Commonly known as:  CARDURA Take 1 tablet (4 mg total) by mouth at bedtime.   famotidine 10 MG tablet Commonly known as:  Pepcid AC Take 1 tablet (10 mg total) by mouth 2 (two) times daily.   fluticasone 50 MCG/ACT nasal spray Commonly known as:  FLONASE Place 2 sprays into both nostrils 2 (two) times daily.   meclizine 25 MG tablet Commonly known as:  ANTIVERT Take 1 tablet (25 mg total) by mouth daily as needed (vertigo).   montelukast 10 MG tablet Commonly known as:  SINGULAIR Take 1 tablet (10 mg total) by mouth daily.   multivitamin with minerals Tabs tablet Take 1 tablet by mouth daily.   rOPINIRole 1 MG tablet Commonly known as:  REQUIP Take 1 tablet (1 mg total) by mouth 3 (three) times daily.   senna-docusate 8.6-50 MG tablet Commonly known as:  Senokot-S Take 1 tablet by mouth 2 (two) times daily.   simvastatin 40 MG tablet Commonly known as:  ZOCOR Take 1 tablet (40 mg total) by mouth daily.   tamsulosin 0.4 MG Caps capsule Commonly known as:  FLOMAX Take 1 capsule (0.4 mg total) by mouth daily.   triamterene-hydrochlorothiazide 37.5-25 MG tablet Commonly known as:  MAXZIDE-25 Take 1/2 tablet by mouth daily           Objective:   Physical Exam There were no vitals taken for this visit. This was a Geographical information systems officer.  The patient looks alert, oriented x3, he looks however slightly weaker than his usual.    Assessment      Assessment   COPD- Dr Melvyn Novas 08-2015, stable, RTC PRN CKD -----Creatinine 1.67, GFR 32 Hyperlipidemia Depression, anxiety  Dyspepsia: Chronic, declined a GI referral in 2017 after sxs decreased with PPIs empirically NEURO: --Decreased memory --Anisocoria- L pupil larger ,s/p  surgery --Neuropathy --H/o Mnire's  -- on diuretics , valium, meclizine Osteoporosis -- h/o pelvic Fx, dexa April 2016: T score (-) 2.3;  rx fosamax 03-2015, dc after 3 months d/t dysphagia; prolia #1 05-03-16 DJD  -- hydrocodone rarely RLS OSA dx remotely, used a CPAP, wt loss and cpap d/c ~ 2012 (Dr Maxwell Caul) GU: --H/o incontinence, saw Dr Karsten Ro, OV ~ 2013 --Urinary retention 02-2016: sepsis, admitted, had a foley temporarily  H/o melanoma H/o subdural hematoma, epidural abscess, staph aureus  sepsis H/o Prosthetic joint infection H/o Blood clots H/o gastric ulcer C diff dx 10-19-15, s/p flagyl x 2 , diarrhea again 03-2016, better after vancomycin. Coordinate care with his daughter [name]  [phone]  PLAN:  UTI?  Others?. The patient is feeling poorly for the last 2 days, decreased p.o. intake, + home test for UTI, cough/shortness of breath slightly over baseline. Under ideal circumstances I would bring the patient to the office for a full assessment but due for the  coronavirus pandemia our office is closed for symptomatic patients.  I think his best option is to go to the ER.  He simply may be having a UTI but I am also concerned about early dehydration, early sepsis or a viral infection. Spoke with the ER MD regards the situation, appreciate his help.  The patient's daughter agreed to take him to the emergency room at the [facility].    I discussed the assessment and treatment plan with the patient. The patient was provided an opportunity to  ask questions and all were answered. The patient agreed with the plan and demonstrated an understanding of the instructions.   The patient was advised to call back or seek an in-person evaluation if the symptoms worsen or if the condition fails to improve as anticipated.

## 2018-10-16 NOTE — Telephone Encounter (Signed)
Spoke w/ [name], Pt at ED- she is in car waiting as they are not allowing visitors- gave her ED number to call and check on him.

## 2018-10-16 NOTE — Telephone Encounter (Signed)
FYI

## 2018-10-16 NOTE — ED Provider Notes (Signed)
Emergency Department Provider Note   I have reviewed the triage vital signs and the nursing notes.   HISTORY  Chief Complaint Dysuria and Cough   HPI Micheal Frank is a 85 y.o. male with PMH of asthma, CKD, COPD, and HLD presents to the emergency department for evaluation of dysuria with urine frequency along with cough.  Patient denies any fever at home.  He began having urine frequency yesterday which worsened overnight.  He has developed some mild dysuria.  He denies any abdominal pain or flank pain.  He does note some mildly productive cough but notes a history of COPD.  He has been using his albuterol inhaler which improved symptoms.  Denies any chest pain.  No travel or recent sick contacts.  He was evaluated by his PCP during a telehealth visit today and referred to the emergency department.   Past Medical History:  Diagnosis Date  . Arthritis    left leg also has swelling  . Asthma   . Blood dyscrasia    pt CAN NOT have any blood thinners d/t hx brain bleed  . Chronic kidney disease, stage III (moderate) ([place]) 01/20/2013  . Complication of anesthesia    confusion with anesthesia  . Constipation    miralax prn  . COPD (chronic obstructive pulmonary disease) (HCC)    emphysema  . Depression    takes wellbutrin bid  . Epidural abscess   . Gastric ulcer   . History of blood clots   . Hyperlipidemia   . Melanoma ([place])   . Peripheral neuropathy   . Pneumonia    as a child and in 1952;had a pneumonia vaccine couple of years ago  . Prosthetic joint infection ([place])   . S/P right heart catheterization    at least 32yrs  . Shingles   . Short-term memory loss   . Shortness of breath    with exertion  . Sleep apnea    has CPAP but doesn't use it;sleep study done at least 39yrs ago  . Staphylococcus aureus bacteremia with sepsis ([place])   . Subdural hematoma ([place]) 03/30/11  . Urinary frequency    wears depends    Patient Active Problem List   Diagnosis Date Noted  .  Multiple rib fractures 01/17/2018  . Essential hypertension 01/16/2018  . BPH (benign prostatic hyperplasia) 01/16/2018  . Compression fracture of T12 vertebra ([place]) 06/24/2017  . Generalized weakness 06/24/2017  . Presence of left artificial knee joint 07/24/2016  . Chronic pain of left knee 07/24/2016  . Chronic midline low back pain without sciatica 07/24/2016  . Depression 07/09/2016  . C. difficile diarrhea 05/04/2016  . Chronic cough 08/16/2015  . PCP NOTES >>>>>> 04/06/2015  . CTS (carpal tunnel syndrome) 11/16/2014  . Hyperlipidemia 08/17/2014  . Superficial bruising of chest wall 07/29/2014  . Urgency incontinence 01/23/2013  . Chronic kidney disease, stage III (moderate) ([place]) 01/20/2013  . At high risk for falls 11/28/2012  . ALLERGIC RHINITIS 12/09/2009  . Carcinoma in situ of skin 04/30/2008  . RESTLESS LEG SYNDROME 04/30/2008  . NEUROPATHY 04/30/2008  . GLAUCOMA, BORDERLINE 04/30/2008  . Meniere's disease 04/30/2008  . HEARING LOSS 04/30/2008  . COPD GOLD II  04/30/2008  . DJD (degenerative joint disease) 04/30/2008  . Osteoporosis 04/30/2008  . Sleep apnea 04/30/2008  . HISTORY OF ASBESTOS EXPOSURE 04/30/2008    Past Surgical History:  Procedure Laterality Date  . epidural abscess drainage  12/2010  . EXTERNAL EAR SURGERY  shunt placed in right ear d/t mennieres   . EYE SURGERY     bil cataract surgery  . FILTERING PROCEDURE     filter placed in neck d/t blood  clot  . HERNIA REPAIR     25+yrs ago  . KNEE ARTHROSCOPY    . SKIN BIOPSY     melanoma on head   . TOTAL KNEE ARTHROPLASTY     x 2 left knee 2003/2012  . TOTAL KNEE REVISION  05/23/2011   Procedure: TOTAL KNEE REVISION;  Surgeon: Meredith Pel;  Location: [facility];  Service: Orthopedics;  Laterality: Left;  LEFT KNEE REMOVAL OF SPACER, POSSIBLE REIMPLANTATION OF REVISION TKA VERSES REPLACEMENT ANTIBIOTIC SPACER    Allergies Anticoagulant compound and Protonix [pantoprazole sodium]   Family History  Problem Relation Age of Onset  . Melanoma Brother   . CAD Neg Hx   . Diabetes Neg Hx     Social History Social History   Tobacco Use  . Smoking status: Former Smoker    Packs/day: 2.00    Years: 40.00    Pack years: 80.00    Types: Cigarettes    Last attempt to quit: 07/17/1978    Years since quitting: 40.2  . Smokeless tobacco: Never Used  Substance Use Topics  . Alcohol use: No  . Drug use: No    Review of Systems  Constitutional: No fever/chills Eyes: No visual changes. ENT: No sore throat. Cardiovascular: Denies chest pain. Respiratory: Denies shortness of breath. Positive cough.  Gastrointestinal: No abdominal pain.  No nausea, no vomiting.  No diarrhea.  No constipation. Genitourinary: Positive dysuria and frequency.  Musculoskeletal: Negative for back pain. Skin: Negative for rash. Neurological: Negative for headaches, focal weakness or numbness.  10-point ROS otherwise negative.  ____________________________________________   PHYSICAL EXAM:  VITAL SIGNS: ED Triage Vitals  Enc Vitals Group     BP 10/16/18 1125 (!) 176/97     Pulse Rate 10/16/18 1125 77     Resp 10/16/18 1125 20     Temp 10/16/18 1125 98.2 F (36.8 C)     Temp Source 10/16/18 1125 Oral     SpO2 10/16/18 1125 98 %     Weight 10/16/18 1124 150 lb (68 kg)     Height 10/16/18 1124 5\' 10"  (1.778 m)     Pain Score 10/16/18 1124 0   Constitutional: Alert and oriented. Well appearing and in no acute distress. Eyes: Conjunctivae are normal.  Head: Atraumatic. Nose: No congestion/rhinnorhea. Mouth/Throat: Mucous membranes are moist.  Neck: No stridor.   Cardiovascular: Normal rate, regular rhythm. Good peripheral circulation. Grossly normal heart sounds.   Respiratory: Normal respiratory effort. No retractions. Lungs CTAB. Gastrointestinal: Soft and nontender. No distention.  Musculoskeletal: No lower extremity tenderness nor edema. No gross deformities of extremities.  Neurologic:  Normal speech and language. No gross focal neurologic deficits are appreciated.  Skin:  Skin is warm, dry and intact. No rash noted.  ____________________________________________   LABS (all labs ordered are listed, but only abnormal results are displayed)  Labs Reviewed  COMPREHENSIVE METABOLIC PANEL - Abnormal; Notable for the following components:      Result Value   Glucose, Bld 65 (*)    BUN 24 (*)    Creatinine, Ser 1.52 (*)    GFR calc non Af Amer 39 (*)    GFR calc Af Amer 46 (*)    All other components within normal limits  CBC WITH DIFFERENTIAL/PLATELET - Abnormal; Notable for the following  components:   WBC 11.3 (*)    Neutro Abs 8.4 (*)    Monocytes Absolute 1.3 (*)    All other components within normal limits  URINALYSIS, ROUTINE W REFLEX MICROSCOPIC - Abnormal; Notable for the following components:   APPearance HAZY (*)    Ketones, ur 15 (*)    Nitrite POSITIVE (*)    Leukocytes,Ua SMALL (*)    All other components within normal limits  URINALYSIS, MICROSCOPIC (REFLEX) - Abnormal; Notable for the following components:   Bacteria, UA MANY (*)    All other components within normal limits  URINE CULTURE  TROPONIN I   ____________________________________________  EKG  Rate: 65 PR: 170 QTc: 476  Sinus rhythm. RBBB. Similar to prior. Normal T waves. No STEMI.  ____________________________________________  RADIOLOGY  Dg Chest Portable 1 View  Result Date: 10/16/2018 CLINICAL DATA:  Cough and loss of appetite for 2 days. EXAM: PORTABLE CHEST 1 VIEW COMPARISON:  01/24/2018 FINDINGS: Cardiac silhouette is normal in size. No mediastinal or hilar masses. There is no evidence of adenopathy. Lungs are hyperexpanded. Reticular scarring at the bases. No evidence of pneumonia or pulmonary edema. No convincing pleural effusion and no pneumothorax. Skeletal structures are grossly intact. IMPRESSION: 1. No acute cardiopulmonary disease. 2. Hyperexpanded lungs  consistent with COPD. Electronically Signed   By: Lajean Manes M.D.   On: 10/16/2018 12:24    ____________________________________________   PROCEDURES  Procedure(s) performed:   Procedures  None  ____________________________________________   INITIAL IMPRESSION / ASSESSMENT AND PLAN / ED COURSE  Pertinent labs & imaging results that were available during my care of the patient were reviewed by me and considered in my medical decision making (see chart for details).   Patient presents to the emergency department after seeing his PCP on telehealth today.  Symptoms seem most consistent with possible UTI as the patient has had increased urine frequency and mild dysuria.  Afebrile here with normal oxygen saturation.  He does describe a cough and has history of COPD.  No significant wheezing or increased work of breathing on exam.  No hypoxemia.  My suspicion for COVID is low clinically.  If in the patient's age I do plan on lab work including CBC, chemistry, troponin.  EKG pending.  Will order chest x-ray given cough and reassess.  EKG reviewed and similar to prior. No hypoxemia here. Labs and CXR reviewed. UA with evidence of UTI. Patient is symptomatic with urinary symptoms. Gave Rocephin. No evidence to suspect sepsis. Updated patient's daughter by phone who is waiting in the parking lot. Discussed treatment plan, abx, and ED return precautions. Plan to f/u with Dr. Larose Kells by phone.  ____________________________________________  FINAL CLINICAL IMPRESSION(S) / ED DIAGNOSES  Final diagnoses:  Acute cystitis without hematuria  Cough     MEDICATIONS GIVEN DURING THIS VISIT:  Medications  cefTRIAXone (ROCEPHIN) 1 g in sodium chloride 0.9 % 100 mL IVPB (0 g Intravenous Stopped 10/16/18 1458)     NEW OUTPATIENT MEDICATIONS STARTED DURING THIS VISIT:  New Prescriptions   CEPHALEXIN (KEFLEX) 500 MG CAPSULE    Take 1 capsule (500 mg total) by mouth 4 (four) times daily for 7 days.     Note:  This document was prepared using Dragon voice recognition software and may include unintentional dictation errors.  Nanda Quinton, MD Emergency Medicine    Jauan Wohl, [facility], MD 10/16/18 [phone]

## 2018-10-16 NOTE — Telephone Encounter (Signed)
Copied from [place] [id number]. Topic: General - Other >> Oct 16, 2018  1:08 PM Valla Leaver wrote: Reason for CRM: Daughter Micheal Frank would like a call back from Dr. Larose Kells or cma regarding taking pt to the ED per Dr. Larose Kells instructions

## 2018-10-16 NOTE — Assessment & Plan Note (Signed)
UTI?  Others?. The patient is feeling poorly for the last 2 days, decreased p.o. intake, + home test for UTI, cough/shortness of breath slightly over baseline. Under ideal circumstances I would bring the patient to the office for a full assessment but due for the  coronavirus pandemia our office is closed for symptomatic patients.  I think his best option is to go to the ER.  He simply may be having a UTI but I am also concerned about early dehydration, early sepsis or a viral infection. Spoke with the ER MD regards the situation, appreciate his help.  The patient's daughter agreed to take him to the emergency room at the [facility].

## 2018-10-16 NOTE — Telephone Encounter (Signed)
Could you please call [name], we agreed that based on his symptoms he needed to go to the ER.  Any questions?

## 2018-10-16 NOTE — Telephone Encounter (Signed)
Copied from [place] [id number]. Topic: Quick Communication - See Telephone Encounter >> Oct 16, 2018  2:44 PM Rutherford Nail, NT wrote: CRM for notification. See Telephone encounter for: 10/16/18. [name], patient's daughter, calling to give Dr Larose Kells an update on the patient who is in the ED. States that they are giving him an antibiotic by IV right now and he will probably be ready to go home in 30 mins. Also, they are giving him Cefelexin 500 mg because he does have a UTI. CB#: [phone]

## 2018-10-16 NOTE — ED Notes (Signed)
Pt given apple juice due to glucose-65.

## 2018-10-16 NOTE — ED Triage Notes (Addendum)
Productive cough, decreased appetite x 2 days. Dysuria x 1 day.

## 2018-10-16 NOTE — Telephone Encounter (Signed)
Thank you :)

## 2018-10-16 NOTE — Discharge Instructions (Signed)

## 2018-10-18 LAB — URINE CULTURE
Culture: >=100000 — AB
Special Requests: NORMAL

## 2018-10-21 ENCOUNTER — Other Ambulatory Visit: Payer: Self-pay

## 2018-10-21 ENCOUNTER — Ambulatory Visit (INDEPENDENT_AMBULATORY_CARE_PROVIDER_SITE_OTHER): Payer: Medicare Other | Admitting: Internal Medicine

## 2018-10-21 DIAGNOSIS — N390 Urinary tract infection, site not specified: Secondary | ICD-10-CM | POA: Diagnosis not present

## 2018-10-21 NOTE — Progress Notes (Signed)
Subjective:    Patient ID: Micheal Frank, male    DOB: January 30, 1926, 85 y.o.   MRN: [id number]  DOS:  10/21/2018 Type of visit - description: Virtual Visit via Video Note  I connected with@ on 10/23/18 at  2:00 PM EDT by a video enabled telemedicine application and verified that I am speaking with the correct person using two identifiers.   THIS ENCOUNTER IS A VIRTUAL VISIT DUE TO COVID-19 - PATIENT WAS NOT SEEN IN THE OFFICE. PATIENT HAS CONSENTED TO VIRTUAL VISIT / TELEMEDICINE VISIT   Location of patient: home  Location of provider: office  I discussed the limitations of evaluation and management by telemedicine and the availability of in person appointments. The patient expressed understanding and agreed to proceed.  History of Present Illness: ER follow-up, I spoke with the patient and his daughter [name]. Since the last time we communicated, he went to the ER: CMP CBC were satisfactory,Urinalysis shows some leukocytes, urine culture show multiple species. He was felt to have a UTI, received a Rocephin injection and was prescribed Keflex. Since he went back home 5 days ago, he is doing better. No further dysuria Appetite improved, again overall he feels stronger.   Review of Systems No fever chills Still has cough, slightly above baseline, [name] wonders if it is due to allergies rather than infection. No chest pain, no difficulty breathing. No nausea, vomiting, diarrhea.  Past Medical History:  Diagnosis Date  . Arthritis    left leg also has swelling  . Asthma   . Blood dyscrasia    pt CAN NOT have any blood thinners d/t hx brain bleed  . Chronic kidney disease, stage III (moderate) ([place]) 01/20/2013  . Complication of anesthesia    confusion with anesthesia  . Constipation    miralax prn  . COPD (chronic obstructive pulmonary disease) ([facility])    emphysema  . Depression    takes wellbutrin bid  . Epidural abscess   . Gastric ulcer   . History of blood clots   .  Hyperlipidemia   . Melanoma ([place])   . Peripheral neuropathy   . Pneumonia    as a child and in 1952;had a pneumonia vaccine couple of years ago  . Prosthetic joint infection ([place])   . S/P right heart catheterization    at least 86yrs  . Shingles   . Short-term memory loss   . Shortness of breath    with exertion  . Sleep apnea    has CPAP but doesn't use it;sleep study done at least 77yrs ago  . Staphylococcus aureus bacteremia with sepsis ([place])   . Subdural hematoma ([place]) 03/30/11  . Urinary frequency    wears depends    Past Surgical History:  Procedure Laterality Date  . epidural abscess drainage  12/2010  . 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 EYE SURGERY     bil cataract surgery  . FILTERING PROCEDURE     filter placed in neck d/t blood  clot  . HERNIA REPAIR     25+yrs ago  . KNEE ARTHROSCOPY    . SKIN BIOPSY     melanoma on head   . TOTAL KNEE ARTHROPLASTY     x 2 left knee 2003/2012  . TOTAL KNEE REVISION  05/23/2011   Procedure: TOTAL KNEE REVISION;  Surgeon: Meredith Pel;  Location: [facility];  Service: Orthopedics;  Laterality: Left;  LEFT KNEE REMOVAL  OF SPACER, POSSIBLE REIMPLANTATION OF REVISION TKA VERSES REPLACEMENT ANTIBIOTIC SPACER    Social History   Socioeconomic History  . Marital status: Widowed    Spouse name: Not on file  . Number of children: 3  . Years of education: Not on file  . Highest education level: Not on file  Occupational History  . Occupation: retired  Scientific laboratory technician  . Financial resource strain: Not on file  . Food insecurity:    Worry: Not on file    Inability: Not on file  . Transportation needs:    Medical: Not on file    Non-medical: Not on file  Tobacco Use  . Smoking status: Former Smoker    Packs/day: 2.00    Years: 40.00    Pack years: 80.00    Types: Cigarettes    Last attempt to quit: 07/17/1978    Years since quitting: 40.2  . Smokeless tobacco: Never Used  Substance and Sexual  Activity  . Alcohol use: No  . Drug use: No  . Sexual activity: Not Currently  Lifestyle  . Physical activity:    Days per week: Not on file    Minutes per session: Not on file  . Stress: Not on file  Relationships  . Social connections:    Talks on phone: Not on file    Gets together: Not on file    Attends religious service: Not on file    Active member of club or organization: Not on file    Attends meetings of clubs or organizations: Not on file    Relationship status: Not on file  . Intimate partner violence:    Fear of current or ex partner: Not on file    Emotionally abused: Not on file    Physically abused: Not on file    Forced sexual activity: Not on file  Other Topics Concern  . Not on file  Social History Narrative   Lost wife 06-2013   Lives in his house, 1 daughter and her 2 adult children live w/ him (1g-child w/ special needs)      Allergies as of 10/21/2018      Reactions   Anticoagulant Compound    History of bleeding on the brain per daughter   Protonix [pantoprazole Sodium] Diarrhea      Medication List       Accurate as of October 21, 2018 11:59 PM. Always use your most recent med list.        acetaminophen 500 MG tablet Commonly known as:  TYLENOL Take 2 tablets (1,000 mg total) by mouth every 8 (eight) hours as needed for mild pain.   albuterol 108 (90 Base) MCG/ACT inhaler Commonly known as:  ProAir HFA Inhale 2 puffs into the lungs every 6 (six) hours as needed for wheezing.   benzonatate 200 MG capsule Commonly known as:  TESSALON TAKE ONE CAPSULE 3-4 TIMES DAILY AS NEEDED FOR COUGH   budesonide-formoterol 160-4.5 MCG/ACT inhaler Commonly known as:  SYMBICORT Inhale 2 puffs into the lungs 2 (two) times daily.   buPROPion 200 MG 12 hr tablet Commonly known as:  WELLBUTRIN SR Take 1 tablet (200 mg total) by mouth 2 (two) times daily.   calcium carbonate 600 MG Tabs tablet Commonly known as:  OS-CAL Take 600 mg by mouth daily.    cephALEXin 500 MG capsule Commonly known as:  KEFLEX Take 1 capsule (500 mg total) by mouth 4 (four) times daily for 7 days.   diazepam 5 MG  tablet Commonly known as:  VALIUM Take 1 tablet (5 mg total) by mouth every 6 (six) hours as needed for anxiety.   doxazosin 4 MG tablet Commonly known as:  CARDURA Take 1 tablet (4 mg total) by mouth at bedtime.   famotidine 10 MG tablet Commonly known as:  Pepcid AC Take 1 tablet (10 mg total) by mouth 2 (two) times daily.   fluticasone 50 MCG/ACT nasal spray Commonly known as:  FLONASE Place 2 sprays into both nostrils 2 (two) times daily.   meclizine 25 MG tablet Commonly known as:  ANTIVERT Take 1 tablet (25 mg total) by mouth daily as needed (vertigo).   montelukast 10 MG tablet Commonly known as:  SINGULAIR Take 1 tablet (10 mg total) by mouth daily.   multivitamin with minerals Tabs tablet Take 1 tablet by mouth daily.   rOPINIRole 1 MG tablet Commonly known as:  REQUIP Take 1 tablet (1 mg total) by mouth 3 (three) times daily.   senna-docusate 8.6-50 MG tablet Commonly known as:  Senokot-S Take 1 tablet by mouth 2 (two) times daily.   simvastatin 40 MG tablet Commonly known as:  ZOCOR Take 1 tablet (40 mg total) by mouth daily.   tamsulosin 0.4 MG Caps capsule Commonly known as:  FLOMAX Take 1 capsule (0.4 mg total) by mouth daily.   triamterene-hydrochlorothiazide 37.5-25 MG tablet Commonly known as:  MAXZIDE-25 Take 1/2 tablet by mouth daily           Objective:   Physical Exam There were no vitals taken for this visit. 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    Assessment    Assessment   COPD- Dr Melvyn Novas 08-2015, stable, RTC PRN CKD -----Creatinine 1.67, GFR 32 Hyperlipidemia Depression, anxiety  Dyspepsia: Chronic, declined a GI referral in 2017 after sxs decreased with PPIs empirically NEURO:  --Decreased memory --Anisocoria- L pupil larger ,s/p surgery --Neuropathy --H/o Mnire's  -- on diuretics , valium, meclizine Osteoporosis -- h/o pelvic Fx, dexa April 2016: T score (-) 2.3;  rx fosamax 03-2015, dc after 3 months d/t dysphagia; prolia #1 05-03-16 DJD  -- hydrocodone rarely RLS OSA dx remotely, used a CPAP, wt loss and cpap d/c ~ 2012 (Dr Maxwell Caul) GU: --H/o incontinence, saw Dr Karsten Ro, OV ~ 2013 --Urinary retention 02-2016: sepsis, admitted, had a foley temporarily  H/o melanoma H/o subdural hematoma, epidural abscess, staph aureus  sepsis H/o Prosthetic joint infection H/o Blood clots H/o gastric ulcer C diff dx 10-19-15, s/p flagyl x 2 , diarrhea again 03-2016, better after vancomycin. Coordinate care with his daughter [name]  [phone]  PLAN:  This is a video visit UTI? Since the last office visit, he was evaluated at the ER, eventually urine culture was contaminated.  He  Is taking Keflex, feeling better, no fever, appetite improving. At this point, we will continue with empiric antibiotics since she seems to be improving.  Otherwise continue the same medication call anytime for questions or concerns.  I discussed the assessment and treatment plan with the patient. The patient was provided an opportunity to ask questions and all were answered. The patient agreed with the plan and demonstrated an understanding of the instructions.   The patient was advised to call back or seek an in-person evaluation if the symptoms worsen or if the condition fails to improve as anticipated.

## 2018-10-23 NOTE — Assessment & Plan Note (Signed)
This is a video visit UTI? Since the last office visit, he was evaluated at the ER, eventually urine culture was contaminated.  He  Is taking Keflex, feeling better, no fever, appetite improving. At this point, we will continue with empiric antibiotics since she seems to be improving.  Otherwise continue the same medication call anytime for questions or concerns.

## 2018-11-04 ENCOUNTER — Other Ambulatory Visit: Payer: Self-pay

## 2018-11-04 ENCOUNTER — Ambulatory Visit (INDEPENDENT_AMBULATORY_CARE_PROVIDER_SITE_OTHER): Payer: Medicare Other | Admitting: Internal Medicine

## 2018-11-04 DIAGNOSIS — G5603 Carpal tunnel syndrome, bilateral upper limbs: Secondary | ICD-10-CM | POA: Diagnosis not present

## 2018-11-04 DIAGNOSIS — J449 Chronic obstructive pulmonary disease, unspecified: Secondary | ICD-10-CM | POA: Diagnosis not present

## 2018-11-04 NOTE — Assessment & Plan Note (Signed)
This is a video visit UTI?:  Symptoms resolved. COPD: Having cough without fever or greenish sputum production.  The daughter thinks increased cough is related to allergies.  He is in no distress.  We agreed to continue Symbicort twice a day and his rescue inhaler.  Will let me know if he develop fever, chills or increased respiratory symptoms CTS: Reports hand numbness consistent with previous history of CTS, recommend to use splinters regularly.  Call if not better. RTC 3 to 4 months for face-to-face visit.  We will asked my staff to schedule

## 2018-11-04 NOTE — Progress Notes (Signed)
Subjective:    Patient ID: Micheal Frank, male    DOB: [birth year], 85 y.o.   MRN: [id number]  DOS:  11/04/2018 Type of visit - description: Virtual Visit via Video Note  I connected with@ on 11/04/18 at  1:00 PM EDT by a video enabled telemedicine application and verified that I am speaking with the correct person using two identifiers.   THIS ENCOUNTER IS A VIRTUAL VISIT DUE TO COVID-19 - PATIENT WAS NOT SEEN IN THE OFFICE. PATIENT HAS CONSENTED TO VIRTUAL VISIT / TELEMEDICINE VISIT   Location of patient: home  Location of provider: office  I discussed the limitations of evaluation and management by telemedicine and the availability of in person appointments. The patient expressed understanding and agreed to proceed.  History of Present Illness: Checkup I interviewed both the patient and his daughter. He reports he is doing okay. No further UTI symptoms He has COPD,  cough is slightly above baseline, only with occasional clear sputum production. We reviewed his medications and recent labs  History of CTS, bilateral, slightly worse lately   Review of Systems No fever chills No dysuria, gross hematuria difficulty urinating  Past Medical History:  Diagnosis Date  . Arthritis    left leg also has swelling  . Asthma   . Blood dyscrasia    pt CAN NOT have any blood thinners d/t hx brain bleed  . Chronic kidney disease, stage III (moderate) ([facility]) 01/20/2013  . Complication of anesthesia    confusion with anesthesia  . Constipation    miralax prn  . COPD (chronic obstructive pulmonary disease) ([facility])    emphysema  . Depression    takes wellbutrin bid  . Epidural abscess   . Gastric ulcer   . History of blood clots   . Hyperlipidemia   . Melanoma ([facility])   . Peripheral neuropathy   . Pneumonia    as a child and in 1952;had a pneumonia vaccine couple of years ago  . Prosthetic joint infection ([facility])   . S/P right heart catheterization    at least 34yrs  . Shingles   .  Short-term memory loss   . Shortness of breath    with exertion  . Sleep apnea    has CPAP but doesn't use it;sleep study done at least 45yrs ago  . Staphylococcus aureus bacteremia with sepsis ([facility])   . Subdural hematoma ([facility]) 03/30/11  . Urinary frequency    wears depends    Past Surgical History:  Procedure Laterality Date  . epidural abscess drainage  12/2010  . 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 EYE SURGERY     bil cataract surgery  . FILTERING PROCEDURE     filter placed in neck d/t blood  clot  . HERNIA REPAIR     25+yrs ago  . KNEE ARTHROSCOPY    . SKIN BIOPSY     melanoma on head   . TOTAL KNEE ARTHROPLASTY     x 2 left knee 2003/2012  . TOTAL KNEE REVISION  05/23/2011   Procedure: TOTAL KNEE REVISION;  Surgeon: Meredith Pel;  Location: [facility];  Service: Orthopedics;  Laterality: Left;  LEFT KNEE REMOVAL OF SPACER, POSSIBLE REIMPLANTATION OF REVISION TKA VERSES REPLACEMENT ANTIBIOTIC SPACER    Social History   Socioeconomic History  . Marital status: Widowed    Spouse name: Not on file  . Number of children: 3  . Years of  education: Not on file  . Highest education level: Not on file  Occupational History  . Occupation: retired  Scientific laboratory technician  . Financial resource strain: Not on file  . Food insecurity:    Worry: Not on file    Inability: Not on file  . Transportation needs:    Medical: Not on file    Non-medical: Not on file  Tobacco Use  . Smoking status: Former Smoker    Packs/day: 2.00    Years: 40.00    Pack years: 80.00    Types: Cigarettes    Last attempt to quit: 07/17/1978    Years since quitting: 40.3  . Smokeless tobacco: Never Used  Substance and Sexual Activity  . Alcohol use: No  . Drug use: No  . Sexual activity: Not Currently  Lifestyle  . Physical activity:    Days per week: Not on file    Minutes per session: Not on file  . Stress: Not on file  Relationships  . Social connections:    Talks  on phone: Not on file    Gets together: Not on file    Attends religious service: Not on file    Active member of club or organization: Not on file    Attends meetings of clubs or organizations: Not on file    Relationship status: Not on file  . Intimate partner violence:    Fear of current or ex partner: Not on file    Emotionally abused: Not on file    Physically abused: Not on file    Forced sexual activity: Not on file  Other Topics Concern  . Not on file  Social History Narrative   Lost wife 06-2013   Lives in his house, 1 daughter and her 2 adult children live w/ him (1g-child w/ special needs)      Allergies as of 11/04/2018      Reactions   Anticoagulant Compound    History of bleeding on the brain per daughter   Protonix [pantoprazole Sodium] Diarrhea      Medication List       Accurate as of November 04, 2018  1:03 PM. Always use your most recent med list.        acetaminophen 500 MG tablet Commonly known as:  TYLENOL Take 2 tablets (1,000 mg total) by mouth every 8 (eight) hours as needed for mild pain.   albuterol 108 (90 Base) MCG/ACT inhaler Commonly known as:  ProAir HFA Inhale 2 puffs into the lungs every 6 (six) hours as needed for wheezing.   benzonatate 200 MG capsule Commonly known as:  TESSALON TAKE ONE CAPSULE 3-4 TIMES DAILY AS NEEDED FOR COUGH   budesonide-formoterol 160-4.5 MCG/ACT inhaler Commonly known as:  SYMBICORT Inhale 2 puffs into the lungs 2 (two) times daily.   buPROPion 200 MG 12 hr tablet Commonly known as:  WELLBUTRIN SR Take 1 tablet (200 mg total) by mouth 2 (two) times daily.   calcium carbonate 600 MG Tabs tablet Commonly known as:  OS-CAL Take 600 mg by mouth daily.   diazepam 5 MG tablet Commonly known as:  VALIUM Take 1 tablet (5 mg total) by mouth every 6 (six) hours as needed for anxiety.   doxazosin 4 MG tablet Commonly known as:  CARDURA Take 1 tablet (4 mg total) by mouth at bedtime.   famotidine 10 MG  tablet Commonly known as:  Pepcid AC Take 1 tablet (10 mg total) by mouth 2 (two) times daily.  fluticasone 50 MCG/ACT nasal spray Commonly known as:  FLONASE Place 2 sprays into both nostrils 2 (two) times daily.   meclizine 25 MG tablet Commonly known as:  ANTIVERT Take 1 tablet (25 mg total) by mouth daily as needed (vertigo).   montelukast 10 MG tablet Commonly known as:  SINGULAIR Take 1 tablet (10 mg total) by mouth daily.   multivitamin with minerals Tabs tablet Take 1 tablet by mouth daily.   rOPINIRole 1 MG tablet Commonly known as:  REQUIP Take 1 tablet (1 mg total) by mouth 3 (three) times daily.   senna-docusate 8.6-50 MG tablet Commonly known as:  Senokot-S Take 1 tablet by mouth 2 (two) times daily.   simvastatin 40 MG tablet Commonly known as:  ZOCOR Take 1 tablet (40 mg total) by mouth daily.   tamsulosin 0.4 MG Caps capsule Commonly known as:  FLOMAX Take 1 capsule (0.4 mg total) by mouth daily.   triamterene-hydrochlorothiazide 37.5-25 MG tablet Commonly known as:  MAXZIDE-25 Take 1/2 tablet by mouth daily           Objective:   Physical Exam There were no vitals taken for this visit. This is a video virtual visit.  He seems alert oriented x3, + HOH.  No physical or emotional distress noted    Assessment     Assessment   COPD- Dr Melvyn Novas 08-2015, stable, RTC PRN CKD -----Creatinine 1.67, GFR 32 Hyperlipidemia Depression, anxiety  Dyspepsia: Chronic, declined a GI referral in 2017 after sxs decreased with PPIs empirically NEURO: --Decreased memory --Anisocoria- L pupil larger ,s/p surgery --Neuropathy --H/o Mnire's  -- on diuretics , valium, meclizine Osteoporosis -- h/o pelvic Fx, dexa April 2016: T score (-) 2.3;  rx fosamax 03-2015, dc after 3 months d/t dysphagia; prolia #1 05-03-16 DJD  -- hydrocodone rarely RLS OSA dx remotely, used a CPAP, wt loss and cpap d/c ~ 2012 (Dr Maxwell Caul) GU: --H/o incontinence, saw Dr Karsten Ro, OV ~ 2013  --Urinary retention 02-2016: sepsis, admitted, had a foley temporarily  H/o melanoma H/o subdural hematoma, epidural abscess, staph aureus  sepsis H/o Prosthetic joint infection H/o Blood clots H/o gastric ulcer C diff dx 10-19-15, s/p flagyl x 2 , diarrhea again 03-2016, better after vancomycin. Coordinate care with his daughter [name]  [phone]  PLAN:  This is a video visit UTI?:  Symptoms resolved. COPD: Having cough without fever or greenish sputum production.  The daughter thinks increased cough is related to allergies.  He is in no distress.  We agreed to continue Symbicort twice a day and his rescue inhaler.  Will let me know if he develop fever, chills or increased respiratory symptoms CTS: Reports hand numbness consistent with previous history of CTS, recommend to use splinters regularly.  Call if not better. RTC 3 to 4 months for face-to-face visit.  We will asked my staff to schedule  I discussed the assessment and treatment plan with the patient. The patient was provided an opportunity to ask questions and all were answered. The patient agreed with the plan and demonstrated an understanding of the instructions.   The patient was advised to call back or seek an in-person evaluation if the symptoms worsen or if the condition fails to improve as anticipated.

## 2018-11-14 ENCOUNTER — Other Ambulatory Visit: Payer: Self-pay

## 2018-11-14 ENCOUNTER — Other Ambulatory Visit (INDEPENDENT_AMBULATORY_CARE_PROVIDER_SITE_OTHER): Payer: Medicare Other

## 2018-11-14 ENCOUNTER — Telehealth: Payer: Self-pay

## 2018-11-14 DIAGNOSIS — N390 Urinary tract infection, site not specified: Secondary | ICD-10-CM | POA: Diagnosis not present

## 2018-11-14 NOTE — Addendum Note (Signed)
Addended by: Kelle Darting A on: 11/14/2018 03:34 PM   Modules accepted: Orders

## 2018-11-14 NOTE — Telephone Encounter (Signed)
Copied from [facility] [id number]. Topic: General - Inquiry >> Nov 14, 2018  8:39 AM Alanda Slim E wrote: Reason for CRM: Pt is experiencing UTI symptoms and Pt's daughter wants to know if Dr. Larose Kells wants to send another round of medication in for him or have another urine sample sent in? Please advise

## 2018-11-14 NOTE — Telephone Encounter (Signed)
Santiago Glad: Enter orders for a UA, urine culture.  DX UTI Advise daughter to come pick up by sterile container and to bring fresh urine sample. Please reinforce the need to get a clean sample, provide him with wipe.

## 2018-11-14 NOTE — Telephone Encounter (Signed)
Orders have been entered 

## 2018-11-14 NOTE — Telephone Encounter (Signed)
Have you enter the orders for a UA and a urine culture?

## 2018-11-15 LAB — URINALYSIS, ROUTINE W REFLEX MICROSCOPIC
Bilirubin Urine: NEGATIVE
Hgb urine dipstick: NEGATIVE
Ketones, ur: NEGATIVE
Leukocytes,Ua: NEGATIVE
Nitrite: NEGATIVE
RBC / HPF: NONE SEEN (ref 0–?)
Specific Gravity, Urine: 1.025 (ref 1.000–1.030)
Total Protein, Urine: NEGATIVE
Urine Glucose: NEGATIVE
Urobilinogen, UA: 0.2 (ref 0.0–1.0)
pH: 6 (ref 5.0–8.0)

## 2018-11-15 LAB — URINE CULTURE
MICRO NUMBER:: 435364
SPECIMEN QUALITY:: ADEQUATE

## 2018-12-30 ENCOUNTER — Encounter: Payer: Self-pay | Admitting: Internal Medicine

## 2018-12-30 DIAGNOSIS — M25531 Pain in right wrist: Secondary | ICD-10-CM

## 2018-12-31 ENCOUNTER — Telehealth: Payer: Self-pay

## 2018-12-31 ENCOUNTER — Other Ambulatory Visit: Payer: Self-pay | Admitting: Internal Medicine

## 2018-12-31 MED ORDER — HYDROCODONE-ACETAMINOPHEN 5-325 MG PO TABS: 1.0000 | ORAL_TABLET | Freq: Two times a day (BID) | ORAL | 0 refills | Status: DC | PRN

## 2018-12-31 NOTE — Telephone Encounter (Signed)
Spoke with the pharmacist, due to insurance constraints, he might be able to get only #10 pills instead of 30.

## 2018-12-31 NOTE — Telephone Encounter (Signed)
Copied from [place] [id number]. Topic: Quick Communication - Rx Refill/Question >> Dec 31, 2018  3:45 PM [place], [place] D wrote: Medication: HYDROcodone-acetaminophen (NORCO/VICODIN) 5-325 MG tablet / Pharmacy stated they need to know reason for pt being prescribed hydrocodone as he has not had it before. Please advise.  Has the patient contacted their pharmacy? Yes.   (Agent: If no, request that the patient contact the pharmacy for the refill.) (Agent: If yes, when and what did the pharmacy advise?)  Preferred Pharmacy (with phone number or street name): [facility], [place] [phone] (Phone) [phone] (Fax)    Agent: Please be advised that RX refills may take up to 3 business days. We ask that you follow-up with your pharmacy.

## 2018-12-31 NOTE — Telephone Encounter (Signed)
Please advise 

## 2019-01-08 ENCOUNTER — Encounter: Payer: Self-pay | Admitting: Orthopedic Surgery

## 2019-01-08 ENCOUNTER — Ambulatory Visit: Payer: Self-pay

## 2019-01-08 ENCOUNTER — Encounter: Payer: Self-pay | Admitting: Internal Medicine

## 2019-01-08 ENCOUNTER — Ambulatory Visit (INDEPENDENT_AMBULATORY_CARE_PROVIDER_SITE_OTHER): Payer: Medicare Other | Admitting: Orthopedic Surgery

## 2019-01-08 ENCOUNTER — Other Ambulatory Visit: Payer: Self-pay

## 2019-01-08 VITALS — Ht 70.0 in | Wt 150.0 lb

## 2019-01-08 DIAGNOSIS — M19031 Primary osteoarthritis, right wrist: Secondary | ICD-10-CM | POA: Diagnosis not present

## 2019-01-08 DIAGNOSIS — M25531 Pain in right wrist: Secondary | ICD-10-CM

## 2019-01-08 DIAGNOSIS — M19032 Primary osteoarthritis, left wrist: Secondary | ICD-10-CM

## 2019-01-08 DIAGNOSIS — M25532 Pain in left wrist: Secondary | ICD-10-CM | POA: Diagnosis not present

## 2019-01-09 ENCOUNTER — Encounter: Payer: Self-pay | Admitting: Orthopedic Surgery

## 2019-01-09 DIAGNOSIS — M19031 Primary osteoarthritis, right wrist: Secondary | ICD-10-CM | POA: Diagnosis not present

## 2019-01-09 DIAGNOSIS — M19032 Primary osteoarthritis, left wrist: Secondary | ICD-10-CM | POA: Diagnosis not present

## 2019-01-09 DIAGNOSIS — M25531 Pain in right wrist: Secondary | ICD-10-CM

## 2019-01-09 DIAGNOSIS — M25532 Pain in left wrist: Secondary | ICD-10-CM

## 2019-01-09 MED ORDER — BUPIVACAINE HCL 0.25 % IJ SOLN
0.6600 mL | INTRAMUSCULAR | Status: AC | PRN
  Administered 2019-01-09: .66 mL via INTRA_ARTICULAR

## 2019-01-09 MED ORDER — LIDOCAINE HCL 1 % IJ SOLN
5.0000 mL | INTRAMUSCULAR | Status: AC | PRN
  Administered 2019-01-09: 5 mL

## 2019-01-09 MED ORDER — LIDOCAINE HCL 1 % IJ SOLN
5.0000 mL | INTRAMUSCULAR | Status: AC | PRN
  Administered 2019-01-09: 23:00:00 5 mL

## 2019-01-09 MED ORDER — METHYLPREDNISOLONE ACETATE 40 MG/ML IJ SUSP
13.3300 mg | INTRAMUSCULAR | Status: AC | PRN
  Administered 2019-01-09: 13.33 mg via INTRA_ARTICULAR

## 2019-01-09 NOTE — Progress Notes (Signed)
Office Visit Note   Patient: Micheal Frank           Date of Birth: 1926-01-28           MRN: [id number] Visit Date: 01/08/2019 Requested by: Colon Branch, [address] [place],  [place] [id number] PCP: Colon Branch, MD  Subjective: Chief Complaint  Patient presents with  . Right Wrist - Pain  . Left Wrist - Pain    HPI: Micheal Frank is a patient with bilateral wrist pain.  He has had it for years but he states it is getting worse.  States that the right is worse than the left.  He is right-hand dominant.  Wears splints at night.  Takes Tylenol as needed.  Reports occasional numbness and tingling which radiates up the arm.  States that his hands do not work very well.  His daughter is here asking about a scooter but I do not know if he qualifies based on relatively good function of his upper extremities.  He does use a walker and a cane when he gets around at home.  He does not have a wheelchair at home.              ROS: All systems reviewed are negative as they relate to the chief complaint within the history of present illness.  Patient denies  fevers or chills.   Assessment & Plan: Visit Diagnoses:  1. Bilateral wrist pain     Plan: Impression is bilateral wrist pain right worse than left.  Plan is bilateral wrist injections.  Radiographs do show degenerative changes.  I think we may be able to help him some with that injection.  We will see him back as needed  Follow-Up Instructions: Return if symptoms worsen or fail to improve.   Orders:  Orders Placed This Encounter  Procedures  . XR Wrist 2 Views Right  . XR Wrist 2 Views Left   No orders of the defined types were placed in this encounter.     Procedures: Medium Joint Inj: bilateral radiocarpal on 01/09/2019 10:47 PM Indications: pain and diagnostic evaluation Details: 22 G 1.5 in needle, fluoroscopy-guided dorsal approach Medications (Right): 5 mL lidocaine 1 %; 0.66 mL bupivacaine 0.25 %; 13.33 mg  methylPREDNISolone acetate 40 MG/ML Medications (Left): 5 mL lidocaine 1 %; 0.66 mL bupivacaine 0.25 %; 13.33 mg methylPREDNISolone acetate 40 MG/ML Outcome: tolerated well, no immediate complications Procedure, treatment alternatives, risks and benefits explained, specific risks discussed. Consent was given by the patient. Immediately prior to procedure a time out was called to verify the correct patient, procedure, equipment, support staff and site/side marked as required. Patient was prepped and draped in the usual sterile fashion.       Clinical Data: No additional findings.  Objective: Vital Signs: Ht 5\' 10"  (1.778 m)   Wt 150 lb (68 kg)   BMI 21.52 kg/m   Physical Exam:   Constitutional: Patient appears well-developed HEENT:  Head: Normocephalic Eyes:EOM are normal Neck: Normal range of motion Cardiovascular: Normal rate Pulmonary/chest: Effort normal Neurologic: Patient is alert Skin: Skin is warm Psychiatric: Patient has normal mood and affect    Ortho Exam: Ortho exam demonstrates arthritis in the joints and PIP joints.  He has some wasting on the left compared to the right of the interosseous muscles.  EPL FPL function intact but interosseous is a little weak on that left-hand side compared to the right.  Negative Tinel's cubital tunnel  both sides right and left.  No subluxation of the ulnar nerve in that region.  Negative Finkelstein's test and no snuffbox tenderness on either wrist.  Specialty Comments:  No specialty comments available.  Imaging: No results found.   PMFS History: Patient Active Problem List   Diagnosis Date Noted  . Multiple rib fractures 01/17/2018  . Essential hypertension 01/16/2018  . BPH (benign prostatic hyperplasia) 01/16/2018  . Compression fracture of T12 vertebra ([facility]) 06/24/2017  . Generalized weakness 06/24/2017  . Presence of left artificial knee joint 07/24/2016  . Chronic pain of left knee 07/24/2016  . Chronic midline  low back pain without sciatica 07/24/2016  . Depression 07/09/2016  . C. difficile diarrhea 05/04/2016  . Chronic cough 08/16/2015  . PCP NOTES >>>>>> 04/06/2015  . CTS (carpal tunnel syndrome) 11/16/2014  . Hyperlipidemia 08/17/2014  . Superficial bruising of chest wall 07/29/2014  . Urgency incontinence 01/23/2013  . Chronic kidney disease, stage III (moderate) ([facility]) 01/20/2013  . At high risk for falls 11/28/2012  . ALLERGIC RHINITIS 12/09/2009  . Carcinoma in situ of skin 04/30/2008  . RESTLESS LEG SYNDROME 04/30/2008  . NEUROPATHY 04/30/2008  . GLAUCOMA, BORDERLINE 04/30/2008  . Meniere's disease 04/30/2008  . HEARING LOSS 04/30/2008  . COPD GOLD II  04/30/2008  . DJD (degenerative joint disease) 04/30/2008  . Osteoporosis 04/30/2008  . Sleep apnea 04/30/2008  . HISTORY OF ASBESTOS EXPOSURE 04/30/2008   Past Medical History:  Diagnosis Date  . Arthritis    left leg also has swelling  . Asthma   . Blood dyscrasia    pt CAN NOT have any blood thinners d/t hx brain bleed  . Chronic kidney disease, stage III (moderate) ([facility]) 01/20/2013  . Complication of anesthesia    confusion with anesthesia  . Constipation    miralax prn  . COPD (chronic obstructive pulmonary disease) ([facility])    emphysema  . Depression    takes wellbutrin bid  . Epidural abscess   . Gastric ulcer   . History of blood clots   . Hyperlipidemia   . Melanoma ([facility])   . Peripheral neuropathy   . Pneumonia    as a child and in 1952;had a pneumonia vaccine couple of years ago  . Prosthetic joint infection ([facility])   . S/P right heart catheterization    at least 65yrs  . Shingles   . Short-term memory loss   . Shortness of breath    with exertion  . Sleep apnea    has CPAP but doesn't use it;sleep study done at least 92yrs ago  . Staphylococcus aureus bacteremia with sepsis ([facility])   . Subdural hematoma ([facility]) 03/30/11  . Urinary frequency    wears depends    Family History  Problem Relation Age of Onset   . Melanoma Brother   . CAD Neg Hx   . Diabetes Neg Hx     Past Surgical History:  Procedure Laterality Date  . epidural abscess drainage  12/2010  . 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 EYE SURGERY     bil cataract surgery  . FILTERING PROCEDURE     filter placed in neck d/t blood  clot  . HERNIA REPAIR     25+yrs ago  . KNEE ARTHROSCOPY    . SKIN BIOPSY     melanoma on head   . TOTAL KNEE ARTHROPLASTY     x 2 left knee 2003/2012  .  TOTAL KNEE REVISION  05/23/2011   Procedure: TOTAL KNEE REVISION;  Surgeon: Meredith Pel;  Location: [facility];  Service: Orthopedics;  Laterality: Left;  LEFT KNEE REMOVAL OF SPACER, POSSIBLE REIMPLANTATION OF REVISION TKA VERSES REPLACEMENT ANTIBIOTIC SPACER   Social History   Occupational History  . Occupation: retired  Tobacco Use  . Smoking status: Former Smoker    Packs/day: 2.00    Years: 40.00    Pack years: 80.00    Types: Cigarettes    Quit date: 07/17/1978    Years since quitting: 40.5  . Smokeless tobacco: Never Used  Substance and Sexual Activity  . Alcohol use: No  . Drug use: No  . Sexual activity: Not Currently

## 2019-01-10 ENCOUNTER — Ambulatory Visit (INDEPENDENT_AMBULATORY_CARE_PROVIDER_SITE_OTHER): Payer: Medicare Other | Admitting: Internal Medicine

## 2019-01-10 ENCOUNTER — Other Ambulatory Visit: Payer: Self-pay

## 2019-01-10 DIAGNOSIS — R269 Unspecified abnormalities of gait and mobility: Secondary | ICD-10-CM | POA: Diagnosis not present

## 2019-01-10 DIAGNOSIS — R2689 Other abnormalities of gait and mobility: Secondary | ICD-10-CM | POA: Diagnosis not present

## 2019-01-10 NOTE — Progress Notes (Signed)
Subjective:    Patient ID: Micheal Frank, male    DOB: February 01, 1926, 85 y.o.   MRN: [id number]  DOS:  01/10/2019 Type of visit - description: Virtual Visit via Video Note  I connected with@ on 01/10/19 at  4:00 PM EDT by a video enabled telemedicine application and verified that I am speaking with the correct person using two identifiers.   THIS ENCOUNTER IS A VIRTUAL VISIT DUE TO COVID-19 - PATIENT WAS NOT SEEN IN THE OFFICE. PATIENT HAS CONSENTED TO VIRTUAL VISIT / TELEMEDICINE VISIT   Location of patient: home  Location of provider: office  I discussed the limitations of evaluation and management by telemedicine and the availability of in person appointments. The patient expressed understanding and agreed to proceed.  History of Present Illness:   The patient has multiple medical problems that affect his mobility. He is already using a cane or a walker to move within his house however he can no go more than 10 or 15 yards without having to stop due to fatigue or MSK pain. His vision is okay His ability to drive seems appropriate as if the family helps him he can drive a lawnmower.   Review of Systems Recently seen by Dr. Alphonzo Severance, X-rays of the wrist were done, significant DJD, he got cortisone injections and the patient is very satisfied with the results  Past Medical History:  Diagnosis Date  . Arthritis    left leg also has swelling  . Asthma   . Blood dyscrasia    pt CAN NOT have any blood thinners d/t hx brain bleed  . Chronic kidney disease, stage III (moderate) ([place]) 01/20/2013  . Complication of anesthesia    confusion with anesthesia  . Constipation    miralax prn  . COPD (chronic obstructive pulmonary disease) ([facility])    emphysema  . Depression    takes wellbutrin bid  . Epidural abscess   . Gastric ulcer   . History of blood clots   . Hyperlipidemia   . Melanoma ([place])   . Peripheral neuropathy   . Pneumonia    as a child and in 1952;had a pneumonia  vaccine couple of years ago  . Prosthetic joint infection ([place])   . S/P right heart catheterization    at least 67yrs  . Shingles   . Short-term memory loss   . Shortness of breath    with exertion  . Sleep apnea    has CPAP but doesn't use it;sleep study done at least 69yrs ago  . Staphylococcus aureus bacteremia with sepsis ([place])   . Subdural hematoma ([place]) 03/30/11  . Urinary frequency    wears depends    Past Surgical History:  Procedure Laterality Date  . epidural abscess drainage  12/2010  . 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 EYE SURGERY     bil cataract surgery  . FILTERING PROCEDURE     filter placed in neck d/t blood  clot  . HERNIA REPAIR     25+yrs ago  . KNEE ARTHROSCOPY    . SKIN BIOPSY     melanoma on head   . TOTAL KNEE ARTHROPLASTY     x 2 left knee 2003/2012  . TOTAL KNEE REVISION  05/23/2011   Procedure: TOTAL KNEE REVISION;  Surgeon: Meredith Pel;  Location: [facility];  Service: Orthopedics;  Laterality: Left;  LEFT KNEE REMOVAL OF SPACER, POSSIBLE REIMPLANTATION OF REVISION  TKA VERSES REPLACEMENT ANTIBIOTIC SPACER    Social History   Socioeconomic History  . Marital status: Widowed    Spouse name: Not on file  . Number of children: 3  . Years of education: Not on file  . Highest education level: Not on file  Occupational History  . Occupation: retired  Scientific laboratory technician  . Financial resource strain: Not on file  . Food insecurity    Worry: Not on file    Inability: Not on file  . Transportation needs    Medical: Not on file    Non-medical: Not on file  Tobacco Use  . Smoking status: Former Smoker    Packs/day: 2.00    Years: 40.00    Pack years: 80.00    Types: Cigarettes    Quit date: 07/17/1978    Years since quitting: 40.5  . Smokeless tobacco: Never Used  Substance and Sexual Activity  . Alcohol use: No  . Drug use: No  . Sexual activity: Not Currently  Lifestyle  . Physical activity    Days per week:  Not on file    Minutes per session: Not on file  . Stress: Not on file  Relationships  . Social Herbalist on phone: Not on file    Gets together: Not on file    Attends religious service: Not on file    Active member of club or organization: Not on file    Attends meetings of clubs or organizations: Not on file    Relationship status: Not on file  . Intimate partner violence    Fear of current or ex partner: Not on file    Emotionally abused: Not on file    Physically abused: Not on file    Forced sexual activity: Not on file  Other Topics Concern  . Not on file  Social History Narrative   Lost wife 06-2013   Lives in his house, 1 daughter and her 2 adult children live w/ him (1g-child w/ special needs)      Allergies as of 01/10/2019      Reactions   Anticoagulant Compound    History of bleeding on the brain per daughter   Protonix [pantoprazole Sodium] Diarrhea      Medication List       Accurate as of January 10, 2019  4:17 PM. If you have any questions, ask your nurse or doctor.        acetaminophen 500 MG tablet Commonly known as: TYLENOL Take 2 tablets (1,000 mg total) by mouth every 8 (eight) hours as needed for mild pain.   albuterol 108 (90 Base) MCG/ACT inhaler Commonly known as: ProAir HFA Inhale 2 puffs into the lungs every 6 (six) hours as needed for wheezing.   benzonatate 200 MG capsule Commonly known as: TESSALON TAKE ONE CAPSULE 3-4 TIMES DAILY AS NEEDED FOR COUGH What changed:   how much to take  how to take this  when to take this  reasons to take this   budesonide-formoterol 160-4.5 MCG/ACT inhaler Commonly known as: SYMBICORT Inhale 2 puffs into the lungs 2 (two) times daily.   buPROPion 200 MG 12 hr tablet Commonly known as: WELLBUTRIN SR Take 1 tablet (200 mg total) by mouth 2 (two) times daily.   calcium carbonate 600 MG Tabs tablet Commonly known as: OS-CAL Take 600 mg by mouth daily.   diazepam 5 MG tablet  Commonly known as: VALIUM Take 1 tablet (5 mg total) by mouth  every 6 (six) hours as needed for anxiety.   doxazosin 4 MG tablet Commonly known as: CARDURA Take 1 tablet (4 mg total) by mouth at bedtime.   famotidine 10 MG tablet Commonly known as: Pepcid AC Take 1 tablet (10 mg total) by mouth 2 (two) times daily.   fluticasone 50 MCG/ACT nasal spray Commonly known as: FLONASE Place 2 sprays into both nostrils 2 (two) times daily.   HYDROcodone-acetaminophen 5-325 MG tablet Commonly known as: NORCO/VICODIN Take 1 tablet by mouth 2 (two) times daily as needed for moderate pain.   meclizine 25 MG tablet Commonly known as: ANTIVERT Take 1 tablet (25 mg total) by mouth daily as needed (vertigo).   montelukast 10 MG tablet Commonly known as: SINGULAIR Take 1 tablet (10 mg total) by mouth daily.   multivitamin with minerals Tabs tablet Take 1 tablet by mouth daily.   rOPINIRole 1 MG tablet Commonly known as: REQUIP Take 1 tablet (1 mg total) by mouth 3 (three) times daily.   senna-docusate 8.6-50 MG tablet Commonly known as: Senokot-S Take 1 tablet by mouth 2 (two) times daily.   simvastatin 40 MG tablet Commonly known as: ZOCOR Take 1 tablet (40 mg total) by mouth daily.   tamsulosin 0.4 MG Caps capsule Commonly known as: FLOMAX Take 1 capsule (0.4 mg total) by mouth daily.   triamterene-hydrochlorothiazide 37.5-25 MG tablet Commonly known as: MAXZIDE-25 Take 1/2 tablet by mouth daily           Objective:   Physical Exam There were no vitals taken for this visit. This is a virtual video visit, patient is alert oriented x3, + HOH, but pleasant, cooperative.    Assessment     Assessment   COPD- Dr Melvyn Novas 08-2015, stable, RTC PRN CKD -----Creatinine 1.67, GFR 32 Hyperlipidemia Depression, anxiety  Dyspepsia: Chronic, declined a GI referral in 2017 after sxs decreased with PPIs empirically NEURO: --Decreased memory --Anisocoria- L pupil larger ,s/p surgery  --Neuropathy --H/o Mnire's  -- on diuretics , valium, meclizine Osteoporosis -- h/o pelvic Fx, dexa April 2016: T score (-) 2.3;  rx fosamax 03-2015, dc after 3 months d/t dysphagia; prolia #1 05-03-16 DJD  -- hydrocodone rarely RLS OSA dx remotely, used a CPAP, wt loss and cpap d/c ~ 2012 (Dr Maxwell Caul) GU: --H/o incontinence, saw Dr Karsten Ro, OV ~ 2013 --Urinary retention 02-2016: sepsis, admitted, had a foley temporarily  H/o melanoma H/o subdural hematoma, epidural abscess, staph aureus  sepsis H/o Prosthetic joint infection H/o Blood clots H/o gastric ulcer C diff dx 10-19-15, s/p flagyl x 2 , diarrhea again 03-2016, better after vancomycin. Coordinate care with his daughter [name]  [phone]  PLAN: Decreased mobility/gait d/o: The patient has decreased mobility due to multiple issues including DJD, advanced age, general debility. He likes to continue living independently at home, currently uses a cane or a walker.  His upper extremity strength is not enough to use a mechanical wheelchair consequently a scooter will be a significant help for his QOL. The patient and his family will contact a home health agency and I will fill whatever form  necessary to get the patient a scooter.     I discussed the assessment and treatment plan with the patient. The patient was provided an opportunity to ask questions and all were answered. The patient agreed with the plan and demonstrated an understanding of the instructions.   The patient was advised to call back or seek an in-person evaluation if the symptoms worsen or if the  condition fails to improve as anticipated.

## 2019-01-12 NOTE — Assessment & Plan Note (Signed)
Decreased mobility/gait d/o: The patient has decreased mobility due to multiple issues including DJD, advanced age, general debility. He likes to continue living independently at home, currently uses a cane or a walker.  His upper extremity strength is not enough to use a mechanical wheelchair consequently a scooter will be a significant help for his QOL. The patient and his family will contact a home health agency and I will fill whatever form  necessary to get the patient a scooter.

## 2019-01-20 ENCOUNTER — Other Ambulatory Visit: Payer: Self-pay | Admitting: Internal Medicine

## 2019-01-22 ENCOUNTER — Telehealth: Payer: Self-pay

## 2019-01-22 ENCOUNTER — Other Ambulatory Visit: Payer: Self-pay

## 2019-01-22 ENCOUNTER — Encounter: Payer: Self-pay | Admitting: Internal Medicine

## 2019-01-22 DIAGNOSIS — R531 Weakness: Secondary | ICD-10-CM

## 2019-01-22 DIAGNOSIS — Z9181 History of falling: Secondary | ICD-10-CM

## 2019-01-22 NOTE — Telephone Encounter (Signed)
Rx for motorized scooter faxed to [facility] at [phone] w/ demographics, insurance cards and ov note from 01/10/2019.

## 2019-01-24 ENCOUNTER — Other Ambulatory Visit: Payer: Self-pay | Admitting: Internal Medicine

## 2019-02-24 ENCOUNTER — Encounter: Payer: Self-pay | Admitting: Internal Medicine

## 2019-03-04 ENCOUNTER — Encounter: Payer: Medicare Other | Admitting: Internal Medicine

## 2019-03-11 ENCOUNTER — Encounter: Payer: Self-pay | Admitting: Internal Medicine

## 2019-03-11 ENCOUNTER — Other Ambulatory Visit: Payer: Self-pay

## 2019-03-11 ENCOUNTER — Telehealth: Payer: Self-pay

## 2019-03-11 ENCOUNTER — Ambulatory Visit (INDEPENDENT_AMBULATORY_CARE_PROVIDER_SITE_OTHER): Payer: Medicare Other | Admitting: Internal Medicine

## 2019-03-11 VITALS — BP 145/64 | HR 69 | Temp 97.1°F | Resp 18 | Ht 70.0 in | Wt 152.0 lb

## 2019-03-11 DIAGNOSIS — R269 Unspecified abnormalities of gait and mobility: Secondary | ICD-10-CM

## 2019-03-11 DIAGNOSIS — N183 Chronic kidney disease, stage 3 unspecified: Secondary | ICD-10-CM

## 2019-03-11 DIAGNOSIS — R7989 Other specified abnormal findings of blood chemistry: Secondary | ICD-10-CM

## 2019-03-11 DIAGNOSIS — J449 Chronic obstructive pulmonary disease, unspecified: Secondary | ICD-10-CM | POA: Diagnosis not present

## 2019-03-11 DIAGNOSIS — F329 Major depressive disorder, single episode, unspecified: Secondary | ICD-10-CM

## 2019-03-11 DIAGNOSIS — M810 Age-related osteoporosis without current pathological fracture: Secondary | ICD-10-CM

## 2019-03-11 DIAGNOSIS — G5603 Carpal tunnel syndrome, bilateral upper limbs: Secondary | ICD-10-CM

## 2019-03-11 DIAGNOSIS — E785 Hyperlipidemia, unspecified: Secondary | ICD-10-CM

## 2019-03-11 DIAGNOSIS — Z0000 Encounter for general adult medical examination without abnormal findings: Secondary | ICD-10-CM

## 2019-03-11 DIAGNOSIS — Z23 Encounter for immunization: Secondary | ICD-10-CM | POA: Diagnosis not present

## 2019-03-11 DIAGNOSIS — F32A Depression, unspecified: Secondary | ICD-10-CM

## 2019-03-11 DIAGNOSIS — F419 Anxiety disorder, unspecified: Secondary | ICD-10-CM

## 2019-03-11 MED ORDER — SPIRIVA HANDIHALER 18 MCG IN CAPS: 18.0000 ug | ORAL_CAPSULE | Freq: Every day | RESPIRATORY_TRACT | 12 refills | Status: DC

## 2019-03-11 NOTE — Telephone Encounter (Signed)
Pt overdue for Prolia- can you set up please?

## 2019-03-11 NOTE — Progress Notes (Signed)
Pre visit review using our clinic review tool, if applicable. No additional management support is needed unless otherwise documented below in the visit note. 

## 2019-03-11 NOTE — Patient Instructions (Addendum)
GO TO THE LAB : Get the blood work     GO TO THE FRONT DESK Schedule your next appointment   for a checkup in 3 months  STOP BY THE FIRST FLOOR: Schedule your bone density test  We will call you when the Prolia is ready  For breathing: Add Spiriva 1 puff daily  Mucinex DM or Robitussin-DM as needed     Fall Prevention in the Home, Adult Falls can cause injuries and can affect people from all age groups. There are many simple things that you can do to make your home safe and to help prevent falls. Ask for help when making these changes, if needed. What actions can I take to prevent falls? General instructions  Use good lighting in all rooms. Replace any light bulbs that burn out.  Turn on lights if it is dark. Use night-lights.  Place frequently used items in easy-to-reach places. Lower the shelves around your home if necessary.  Set up furniture so that there are clear paths around it. Avoid moving your furniture around.  Remove throw rugs and other tripping hazards from the floor.  Avoid walking on wet floors.  Fix any uneven floor surfaces.  Add color or contrast paint or tape to grab bars and handrails in your home. Place contrasting color strips on the first and last steps of stairways.  When you use a stepladder, make sure that it is completely opened and that the sides are firmly locked. Have someone hold the ladder while you are using it. Do not climb a closed stepladder.  Be aware of any and all pets. What can I do in the bathroom?      Keep the floor dry. Immediately clean up any water that spills onto the floor.  Remove soap buildup in the tub or shower on a regular basis.  Use non-skid mats or decals on the floor of the tub or shower.  Attach bath mats securely with double-sided, non-slip rug tape.  If you need to sit down while you are in the shower, use a plastic, non-slip stool.  Install grab bars by the toilet and in the tub and shower. Do not use  towel bars as grab bars. What can I do in the bedroom?  Make sure that a bedside light is easy to reach.  Do not use oversized bedding that drapes onto the floor.  Have a firm chair that has side arms to use for getting dressed. What can I do in the kitchen?  Clean up any spills right away.  If you need to reach for something above you, use a sturdy step stool that has a grab bar.  Keep electrical cables out of the way.  Do not use floor polish or wax that makes floors slippery. If you must use wax, make sure that it is non-skid floor wax. What can I do in the stairways?  Do not leave any items on the stairs.  Make sure that you have a light switch at the top of the stairs and the bottom of the stairs. Have them installed if you do not have them.  Make sure that there are handrails on both sides of the stairs. Fix handrails that are broken or loose. Make sure that handrails are as long as the stairways.  Install non-slip stair treads on all stairs in your home.  Avoid having throw rugs at the top or bottom of stairways, or secure the rugs with carpet tape to prevent them  from moving.  Choose a carpet design that does not hide the edge of steps on the stairway.  Check any carpeting to make sure that it is firmly attached to the stairs. Fix any carpet that is loose or worn. What can I do on the outside of my home?  Use bright outdoor lighting.  Regularly repair the edges of walkways and driveways and fix any cracks.  Remove high doorway thresholds.  Trim any shrubbery on the main path into your home.  Regularly check that handrails are securely fastened and in good repair. Both sides of any steps should have handrails.  Install guardrails along the edges of any raised decks or porches.  Clear walkways of debris and clutter, including tools and rocks.  Have leaves, snow, and ice cleared regularly.  Use sand or salt on walkways during winter months.  In the garage,  clean up any spills right away, including grease or oil spills. What other actions can I take?  Wear closed-toe shoes that fit well and support your feet. Wear shoes that have rubber soles or low heels.  Use mobility aids as needed, such as canes, walkers, scooters, and crutches.  Review your medicines with your health care provider. Some medicines can cause dizziness or changes in blood pressure, which increase your risk of falling. Talk with your health care provider about other ways that you can decrease your risk of falls. This may include working with a physical therapist or trainer to improve your strength, balance, and endurance. Where to find more information  Centers for Disease Control and Prevention, [employer]: WebmailGuide.co.za  [employer] on Aging: BrainJudge.co.uk Contact a health care provider if:  You are afraid of falling at home.  You feel weak, drowsy, or dizzy at home.  You fall at home. Summary  There are many simple things that you can do to make your home safe and to help prevent falls.  Ways to make your home safe include removing tripping hazards and installing grab bars in the bathroom.  Ask for help when making these changes in your home. This information is not intended to replace advice given to you by your health care provider. Make sure you discuss any questions you have with your health care provider. Document Released: 06/23/2002 Document Revised: 06/15/2017 Document Reviewed: 02/15/2017 Elsevier Patient Education  2020 [employer].

## 2019-03-11 NOTE — Progress Notes (Signed)
Subjective:    Patient ID: Micheal Frank, male    DOB: 1926/05/08, 85 y.o.   MRN: [id number]  DOS:  03/11/2019 Type of visit - description: CPX Here with   his daughter Micheal Frank Chronic medical problems were reviewed. Memory: Slightly worse, forgetful of names, he is however trying to learn how to use a computer. No behavioral issues, daughter reports that sometimes he is "stubborn". HTN: Normal ambulatory BPs Osteoporosis: Due for a Prolia DJD, CTS: Still having pain and numbness on and off COPD: Taking Symbicort twice a day and albuterol as needed, reports a slight increase in DOE lately and more cough with yellowish sputum.    Review of Systems Denies fever, chills, weight loss.  Appetite is very good, BMs normal. No anxiety or depression.  Other than above, a 14 point review of systems is negative     Past Medical History:  Diagnosis Date  . Arthritis    left leg also has swelling  . Asthma   . Blood dyscrasia    pt CAN NOT have any blood thinners d/t hx brain bleed  . Chronic kidney disease, stage III (moderate) ([facility] [place]) 01/20/2013  . Complication of anesthesia    confusion with anesthesia  . Constipation    miralax prn  . COPD (chronic obstructive pulmonary disease) (HCC)    emphysema  . Depression    takes wellbutrin bid  . Epidural abscess   . Gastric ulcer   . History of blood clots   . Hyperlipidemia   . Melanoma ([place])   . Peripheral neuropathy   . Pneumonia    as a child and in 1952;had a pneumonia vaccine couple of years ago  . Prosthetic joint infection ([place])   . S/P right heart catheterization    at least 62yrs  . Shingles   . Short-term memory loss   . Shortness of breath    with exertion  . Sleep apnea    has CPAP but doesn't use it;sleep study done at least 48yrs ago  . Staphylococcus aureus bacteremia with sepsis ([place])   . Subdural hematoma ([place]) 03/30/11  . Urinary frequency    wears depends    Past Surgical History:  Procedure Laterality  Date  . epidural abscess drainage  12/2010  . 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 EYE SURGERY     bil cataract surgery  . FILTERING PROCEDURE     filter placed in neck d/t blood  clot  . HERNIA REPAIR     25+yrs ago  . KNEE ARTHROSCOPY    . SKIN BIOPSY     melanoma on head   . TOTAL KNEE ARTHROPLASTY     x 2 left knee 2003/2012  . TOTAL KNEE REVISION  05/23/2011   Procedure: TOTAL KNEE REVISION;  Surgeon: Meredith Pel;  Location: [facility];  Service: Orthopedics;  Laterality: Left;  LEFT KNEE REMOVAL OF SPACER, POSSIBLE REIMPLANTATION OF REVISION TKA VERSES REPLACEMENT ANTIBIOTIC SPACER   Family History  Problem Relation Age of Onset  . Melanoma Brother   . CAD Neg Hx   . Diabetes Neg Hx     Social History   Socioeconomic History  . Marital status: Widowed    Spouse name: Not on file  . Number of children: 3  . Years of education: Not on file  . Highest education level: Not on file  Occupational History  . Occupation: retired  Scientific laboratory technician  .  Financial resource strain: Not on file  . Food insecurity    Worry: Not on file    Inability: Not on file  . Transportation needs    Medical: Not on file    Non-medical: Not on file  Tobacco Use  . Smoking status: Former Smoker    Packs/day: 2.00    Years: 40.00    Pack years: 80.00    Types: Cigarettes    Quit date: 07/17/1978    Years since quitting: 40.6  . Smokeless tobacco: Never Used  Substance and Sexual Activity  . Alcohol use: No  . Drug use: No  . Sexual activity: Not Currently  Lifestyle  . Physical activity    Days per week: Not on file    Minutes per session: Not on file  . Stress: Not on file  Relationships  . Social Herbalist on phone: Not on file    Gets together: Not on file    Attends religious service: Not on file    Active member of club or organization: Not on file    Attends meetings of clubs or organizations: Not on file    Relationship status:  Not on file  . Intimate partner violence    Fear of current or ex partner: Not on file    Emotionally abused: Not on file    Physically abused: Not on file    Forced sexual activity: Not on file  Other Topics Concern  . Not on file  Social History Narrative   Lost wife 06-2013   Daughters: Micheal Frank   Son:  Micheal Frank    Lives in his house, w/  daughter Micheal Frank) and her 2 adult children live w/ him (1g-child w/ special needs)      Allergies as of 03/11/2019      Reactions   Anticoagulant Compound    History of bleeding on the brain per daughter   Protonix [pantoprazole Sodium] Diarrhea      Medication List       Accurate as of March 11, 2019 11:59 PM. If you have any questions, ask your nurse or doctor.        acetaminophen 500 MG tablet Commonly known as: TYLENOL Take 2 tablets (1,000 mg total) by mouth every 8 (eight) hours as needed for mild pain.   albuterol 108 (90 Base) MCG/ACT inhaler Commonly known as: ProAir HFA Inhale 2 puffs into the lungs every 6 (six) hours as needed for wheezing.   benzonatate 200 MG capsule Commonly known as: TESSALON TAKE ONE CAPSULE 3-4 TIMES DAILY AS NEEDED FOR COUGH What changed:   how much to take  how to take this  when to take this  reasons to take this   budesonide-formoterol 160-4.5 MCG/ACT inhaler Commonly known as: SYMBICORT Inhale 2 puffs into the lungs 2 (two) times daily.   buPROPion 200 MG 12 hr tablet Commonly known as: WELLBUTRIN SR Take 1 tablet (200 mg total) by mouth 2 (two) times daily.   calcium carbonate 600 MG Tabs tablet Commonly known as: OS-CAL Take 600 mg by mouth daily.   diazepam 5 MG tablet Commonly known as: VALIUM Take 1 tablet (5 mg total) by mouth every 6 (six) hours as needed for anxiety.   doxazosin 4 MG tablet Commonly known as: CARDURA Take 1 tablet (4 mg total) by mouth at bedtime.   famotidine 10 MG tablet Commonly known as: Pepcid AC Take 1 tablet (10 mg total) by mouth 2  (two)  times daily.   fluticasone 50 MCG/ACT nasal spray Commonly known as: FLONASE Place 2 sprays into both nostrils 2 (two) times daily.   HYDROcodone-acetaminophen 5-325 MG tablet Commonly known as: NORCO/VICODIN Take 1 tablet by mouth 2 (two) times daily as needed for moderate pain.   meclizine 25 MG tablet Commonly known as: ANTIVERT Take 1 tablet (25 mg total) by mouth daily as needed (vertigo).   montelukast 10 MG tablet Commonly known as: SINGULAIR Take 1 tablet (10 mg total) by mouth daily.   multivitamin with minerals Tabs tablet Take 1 tablet by mouth daily.   rOPINIRole 1 MG tablet Commonly known as: REQUIP Take 1 tablet (1 mg total) by mouth 3 (three) times daily.   senna-docusate 8.6-50 MG tablet Commonly known as: Senokot-S Take 1 tablet by mouth 2 (two) times daily.   simvastatin 40 MG tablet Commonly known as: ZOCOR Take 1 tablet (40 mg total) by mouth daily.   Spiriva HandiHaler 18 MCG inhalation capsule Generic drug: tiotropium Place 1 capsule (18 mcg total) into inhaler and inhale daily. Started by: Kathlene November, MD   tamsulosin 0.4 MG Caps capsule Commonly known as: FLOMAX Take 1 capsule (0.4 mg total) by mouth daily.   triamterene-hydrochlorothiazide 37.5-25 MG tablet Commonly known as: MAXZIDE-25 Take 1/2 tablet by mouth daily           Objective:   Physical Exam BP (!) 145/64 (BP Location: Left Arm, Patient Position: Sitting, Cuff Size: Small)   Pulse 69   Temp (!) 97.1 F (36.2 C) (Temporal)   Resp 18   Ht 5\' 10"  (1.778 m)   Wt 152 lb (68.9 kg)   SpO2 97%   BMI 21.81 kg/m  General:   Well developed, elderly gentleman sitting in a wheelchair, NAD, BMI noted.  HEENT:  Normocephalic . Face symmetric, atraumatic Lungs:  Decreased breath sounds, few rhonchi with cough Normal respiratory effort, no intercostal retractions, no accessory muscle use. Heart: RRR,  no murmur.  Extremities: Mild pretibial edema, mostly at the right leg.   Not a new issue per the patient's daughter Abdomen:  None performed while the patient is sitting down, soft, not tender, not distended Skin: Not pale. Not jaundice Neurologic:  alert & oriented X3.  Communication is difficult due to [facility] Speech normal, gait: Not tested Psych--  Cooperative with normal attention span and concentration.  Behavior appropriate. No anxious or depressed appearing.     Assessment     Assessment   COPD- Dr Melvyn Novas 08-2015, stable, RTC PRN CKD -----Creatinine 1.67, GFR 32 Hyperlipidemia Depression, anxiety  Dyspepsia: Chronic, declined a GI referral in 2017 after sxs decreased with PPIs empirically NEURO: --Decreased memory --Anisocoria- L pupil larger ,s/p surgery --Neuropathy --H/o Mnire's  -- on diuretics , valium, meclizine Osteoporosis -- h/o pelvic Fx, dexa April 2016: T score (-) 2.3;  rx fosamax 03-2015, dc after 3 months d/t dysphagia; prolia #1 05-03-16 DJD  -- hydrocodone rarely RLS OSA dx remotely, used a CPAP, wt loss and cpap d/c ~ 2012 (Dr Maxwell Caul) GU: --H/o incontinence, saw Dr Karsten Ro, OV ~ 2013 --Urinary retention 02-2016: sepsis, admitted, had a foley temporarily  H/o melanoma H/o subdural hematoma, epidural abscess, staph aureus  sepsis H/o Prosthetic joint infection H/o Blood clots H/o gastric ulcer C diff dx 10-19-15, s/p flagyl x 2 , diarrhea again 03-2016, better after vancomycin. Coordinate care with his daughter Micheal Frank  [phone]  PLAN: COPD: Currently on Symbicort and albuterol which he uses appropriately as a rescue inhaler.  DOE is slightly increased lately with increased cough.  Last chest x-ray April 2020, no acute changes.  Add Spiriva reassess in 3 months Addendum: Spiriva very expensive, will try Incruse Ellipta.  Other option would be to add Atrovent. CKD: Check a BMP Hyperlipidemia: Continue statins, check FLP, last LFTs normal Anxiety depression: On Wellbutrin, very rarely uses Valium.  Symptoms controlled CTS, DJD:  Uses Tylenol, Voltaren gel as needed, mild help.  Has hydrocodone but rarely uses it.  Overall the patient reports that symptoms are relatively controlled. Mild cognitive impairment: Observation for now. Osteoporosis: We will proceed with Prolia and order a bone density test.  Check of a Dexa Slightly increased TSH: Check TFTs Gait disorder: Strongly encourage to be safe, he still using lawnmower! CPX done today as well. RTC 3 months   Today, in addition to his physical exam, I spent more than 25 min with the patient: >50% of the time counseling regards COPD, CKD, anxiety and depression, osteoporosis, among other problems. Also: -reviewing the chart and labs ordered by other providers  -coordinating his care

## 2019-03-12 ENCOUNTER — Encounter: Payer: Self-pay | Admitting: Internal Medicine

## 2019-03-12 DIAGNOSIS — Z0000 Encounter for general adult medical examination without abnormal findings: Secondary | ICD-10-CM | POA: Insufficient documentation

## 2019-03-12 LAB — LIPID PANEL
Cholesterol: 121 mg/dL (ref 0–200)
HDL: 42.5 mg/dL (ref >39.00)
LDL Cholesterol: 61 mg/dL (ref 0–99)
NonHDL: 78.51
Total CHOL/HDL Ratio: 3
Triglycerides: 87 mg/dL (ref 0.0–149.0)
VLDL: 17.4 mg/dL (ref 0.0–40.0)

## 2019-03-12 LAB — CBC WITH DIFFERENTIAL/PLATELET
Basophils Absolute: 0.1 10*3/uL (ref 0.0–0.1)
Basophils Relative: 0.8 % (ref 0.0–3.0)
Eosinophils Absolute: 0.3 10*3/uL (ref 0.0–0.7)
Eosinophils Relative: 2.7 % (ref 0.0–5.0)
HCT: 39.9 % (ref 39.0–52.0)
Hemoglobin: 13.1 g/dL (ref 13.0–17.0)
Lymphocytes Relative: 14.2 % (ref 12.0–46.0)
Lymphs Abs: 1.4 10*3/uL (ref 0.7–4.0)
MCHC: 32.8 g/dL (ref 30.0–36.0)
MCV: 96.7 fl (ref 78.0–100.0)
Monocytes Absolute: 1.3 10*3/uL — ABNORMAL HIGH (ref 0.1–1.0)
Monocytes Relative: 13.1 % — ABNORMAL HIGH (ref 3.0–12.0)
Neutro Abs: 6.8 10*3/uL (ref 1.4–7.7)
Neutrophils Relative %: 69.2 % (ref 43.0–77.0)
Platelets: 263 10*3/uL (ref 150.0–400.0)
RBC: 4.12 Mil/uL — ABNORMAL LOW (ref 4.22–5.81)
RDW: 13 % (ref 11.5–15.5)
WBC: 9.8 10*3/uL (ref 4.0–10.5)

## 2019-03-12 LAB — BASIC METABOLIC PANEL WITH GFR
BUN: 19 mg/dL (ref 6–23)
CO2: 26 meq/L (ref 19–32)
Calcium: 9 mg/dL (ref 8.4–10.5)
Chloride: 106 meq/L (ref 96–112)
Creatinine, Ser: 1.66 mg/dL — ABNORMAL HIGH (ref 0.40–1.50)
GFR: 38.97 mL/min — ABNORMAL LOW (ref >60.00)
Glucose, Bld: 73 mg/dL (ref 70–99)
Potassium: 3.8 meq/L (ref 3.5–5.1)
Sodium: 142 meq/L (ref 135–145)

## 2019-03-12 LAB — TSH: TSH: 3.58 u[IU]/mL (ref 0.35–4.50)

## 2019-03-12 LAB — VITAMIN D 25 HYDROXY (VIT D DEFICIENCY, FRACTURES): VITD: 53.45 ng/mL (ref 30.00–100.00)

## 2019-03-12 LAB — T4, FREE: Free T4: 0.85 ng/dL (ref 0.60–1.60)

## 2019-03-12 LAB — T3, FREE: T3, Free: 2.2 pg/mL — ABNORMAL LOW (ref 2.3–4.2)

## 2019-03-12 MED ORDER — INCRUSE ELLIPTA 62.5 MCG/INH IN AEPB: 1.0000 | INHALATION_SPRAY | Freq: Every day | RESPIRATORY_TRACT | 6 refills | Status: DC

## 2019-03-12 NOTE — Telephone Encounter (Signed)
He was here yesterday- family wants to continue Prolia.

## 2019-03-12 NOTE — Assessment & Plan Note (Addendum)
COPD: Currently on Symbicort and albuterol which he uses appropriately as a rescue inhaler.  DOE is slightly increased lately with increased cough.  Last chest x-ray April 2020, no acute changes.  Add Spiriva reassess in 3 months Addendum: Spiriva very expensive, will try Incruse Ellipta.  Other option would be to add Atrovent. CKD: Check a BMP Hyperlipidemia: Continue statins, check FLP, last LFTs normal Anxiety depression: On Wellbutrin, very rarely uses Valium.  Symptoms controlled CTS, DJD: Uses Tylenol, Voltaren gel as needed, mild help.  Has hydrocodone but rarely uses it.  Overall the patient reports that symptoms are relatively controlled. Mild cognitive impairment: Observation for now. Osteoporosis: We will proceed with Prolia and order a bone density test.  Check of a Dexa Slightly increased TSH: Check TFTs Gait disorder: Strongly encourage to be safe, he still using lawnmower! CPX done today as well. RTC 3 months

## 2019-03-12 NOTE — Assessment & Plan Note (Signed)
Immunizations: PNM 23 and  high-dose flu shot today Consider Shingrix at some point. Prostate and colon cancer: Aged out Labs: BMP, FLP, CBC, vitamin D, TFTs Patient education: Fall prevention

## 2019-03-12 NOTE — Telephone Encounter (Signed)
Is the patient requesting prolia? I have tried for several months to get him in and he declined.

## 2019-03-13 ENCOUNTER — Encounter: Payer: Self-pay | Admitting: Internal Medicine

## 2019-03-19 ENCOUNTER — Encounter: Payer: Self-pay | Admitting: Orthopedic Surgery

## 2019-03-19 NOTE — Telephone Encounter (Signed)
Okay to try to tramadol 1 p.o. twice daily for pain #20 with no refills thanks

## 2019-03-20 ENCOUNTER — Other Ambulatory Visit: Payer: Self-pay | Admitting: Surgical

## 2019-03-20 ENCOUNTER — Other Ambulatory Visit: Payer: Self-pay | Admitting: Orthopedic Surgery

## 2019-03-20 MED ORDER — TRAMADOL HCL 50 MG PO TABS: 50.0000 mg | ORAL_TABLET | Freq: Two times a day (BID) | ORAL | 0 refills | Status: DC | PRN

## 2019-03-20 NOTE — Telephone Encounter (Signed)
Pls send in tram 1 po bid prn # 30

## 2019-03-27 NOTE — Telephone Encounter (Signed)
Waiting on results of bone density scheduled  04/08/2019 then will reverify patient in portal.

## 2019-03-28 ENCOUNTER — Other Ambulatory Visit: Payer: Self-pay | Admitting: Internal Medicine

## 2019-03-31 ENCOUNTER — Other Ambulatory Visit (HOSPITAL_BASED_OUTPATIENT_CLINIC_OR_DEPARTMENT_OTHER): Payer: Medicare Other

## 2019-04-08 ENCOUNTER — Ambulatory Visit (HOSPITAL_BASED_OUTPATIENT_CLINIC_OR_DEPARTMENT_OTHER)
Admission: RE | Admit: 2019-04-08 | Discharge: 2019-04-08 | Disposition: A | Discharge status: Home / Self Care | Payer: Medicare Other | Source: Ambulatory Visit | Attending: Internal Medicine | Admitting: Internal Medicine

## 2019-04-08 ENCOUNTER — Other Ambulatory Visit: Payer: Self-pay

## 2019-04-08 DIAGNOSIS — M810 Age-related osteoporosis without current pathological fracture: Secondary | ICD-10-CM | POA: Diagnosis not present

## 2019-04-09 NOTE — Telephone Encounter (Signed)
Verification submitted 04/09/2019. Waiting on SOB from insurance company.

## 2019-05-03 ENCOUNTER — Encounter: Payer: Self-pay | Admitting: Orthopedic Surgery

## 2019-05-05 ENCOUNTER — Other Ambulatory Visit: Payer: Self-pay | Admitting: Surgical

## 2019-05-05 MED ORDER — TRAMADOL HCL 50 MG PO TABS: 50.0000 mg | ORAL_TABLET | Freq: Two times a day (BID) | ORAL | 0 refills | Status: DC | PRN

## 2019-05-07 ENCOUNTER — Other Ambulatory Visit: Payer: Self-pay | Admitting: Internal Medicine

## 2019-05-13 ENCOUNTER — Other Ambulatory Visit: Payer: Self-pay | Admitting: Internal Medicine

## 2019-05-22 ENCOUNTER — Ambulatory Visit: Payer: Medicare Other | Admitting: Orthopedic Surgery

## 2019-05-22 ENCOUNTER — Other Ambulatory Visit: Payer: Self-pay

## 2019-05-22 DIAGNOSIS — M19031 Primary osteoarthritis, right wrist: Secondary | ICD-10-CM

## 2019-05-22 DIAGNOSIS — M19032 Primary osteoarthritis, left wrist: Secondary | ICD-10-CM

## 2019-05-23 ENCOUNTER — Encounter: Payer: Self-pay | Admitting: Orthopedic Surgery

## 2019-05-23 DIAGNOSIS — M19032 Primary osteoarthritis, left wrist: Secondary | ICD-10-CM | POA: Diagnosis not present

## 2019-05-23 DIAGNOSIS — M19031 Primary osteoarthritis, right wrist: Secondary | ICD-10-CM | POA: Diagnosis not present

## 2019-05-23 MED ORDER — BUPIVACAINE HCL 0.25 % IJ SOLN
0.3300 mL | INTRAMUSCULAR | Status: AC | PRN
  Administered 2019-05-23: .33 mL

## 2019-05-23 MED ORDER — METHYLPREDNISOLONE ACETATE 40 MG/ML IJ SUSP
13.3300 mg | INTRAMUSCULAR | Status: AC | PRN
  Administered 2019-05-23: 13.33 mg

## 2019-05-23 MED ORDER — LIDOCAINE HCL 1 % IJ SOLN
3.0000 mL | INTRAMUSCULAR | Status: AC | PRN
  Administered 2019-05-23: 3 mL

## 2019-05-23 NOTE — Progress Notes (Signed)
Office Visit Note   Patient: Micheal Frank           Date of Birth: 06-01-1926           MRN: [id number] Visit Date: 05/22/2019 Requested by: Colon Branch, [address] [place],  [place] [address] PCP: Colon Branch, MD  Subjective: Chief Complaint  Patient presents with  . Right Hand - Pain  . Left Hand - Pain    HPI: Micheal Frank is a 85 year old patient with known bilateral carpal tunnel syndrome.  He wears splints at night.  He has had bilateral carpal tunnel injections done in June which did help.  He is right-hand dominant.  He has a camera on him at night he does wake up with pain in his hands.  Daughter is with him today.  Requesting something stronger than tramadol for pain but no think that is a great idea.              ROS: All systems reviewed are negative as they relate to the chief complaint within the history of present illness.  Patient denies  fevers or chills.   Assessment & Plan: Visit Diagnoses:  1. Arthritis of left wrist   2. Arthritis of right wrist     Plan: Impression is arthritis and carpal tunnel syndrome bilateral wrist.  Plan repeat bilateral carpal tunnel injections today under ultrasound guidance.  I think it may help him.  Patient does not have a palmaris which aided in the ease of injection.  Follow-up with me as needed.  Follow-Up Instructions: Return if symptoms worsen or fail to improve.   Orders:  No orders of the defined types were placed in this encounter.  No orders of the defined types were placed in this encounter.     Procedures: Hand/UE Inj: bilateral carpal tunnel for carpal tunnel syndrome on 05/23/2019 11:53 PM Details: 22 G needle, ultrasound-guided volar approach Medications (Right): 3 mL lidocaine 1 %; 0.33 mL bupivacaine 0.25 %; 13.33 mg methylPREDNISolone acetate 40 MG/ML Medications (Left): 3 mL lidocaine 1 %; 0.33 mL bupivacaine 0.25 %; 13.33 mg methylPREDNISolone acetate 40 MG/ML      Clinical Data:  No additional findings.  Objective: Vital Signs: There were no vitals taken for this visit.  Physical Exam:   Constitutional: Patient appears well-developed HEENT:  Head: Normocephalic Eyes:EOM are normal Neck: Normal range of motion Cardiovascular: Normal rate Pulmonary/chest: Effort normal Neurologic: Patient is alert Skin: Skin is warm Psychiatric: Patient has normal mood and affect    Ortho Exam: Ortho exam demonstrates a little bit of abductor pollicis brevis wasting in both hands.  Diffuse arthritis is present throughout the wrist and phalangeal joints.  Patient has about half of his normal flexion extension radial and ulnar deviation range of motion.  No snuffbox tenderness.  Pedal pulses and radial pulses intact.  Specialty Comments:  No specialty comments available.  Imaging: No results found.   PMFS History: Patient Active Problem List   Diagnosis Date Noted  . Annual physical exam 03/12/2019  . Multiple rib fractures 01/17/2018  . Essential hypertension 01/16/2018  . BPH (benign prostatic hyperplasia) 01/16/2018  . Compression fracture of T12 vertebra ([place]) 06/24/2017  . Generalized weakness 06/24/2017  . Presence of left artificial knee joint 07/24/2016  . Chronic pain of left knee 07/24/2016  . Chronic midline low back pain without sciatica 07/24/2016  . Anxiety and depression 07/09/2016  . C. difficile diarrhea 05/04/2016  . Chronic  cough 08/16/2015  . PCP NOTES >>>>>> 04/06/2015  . CTS (carpal tunnel syndrome) 11/16/2014  . Hyperlipidemia 08/17/2014  . Superficial bruising of chest wall 07/29/2014  . Urgency incontinence 01/23/2013  . Chronic kidney disease, stage III (moderate) ([place]) 01/20/2013  . At high risk for falls 11/28/2012  . ALLERGIC RHINITIS 12/09/2009  . Carcinoma in situ of skin 04/30/2008  . RESTLESS LEG SYNDROME 04/30/2008  . NEUROPATHY 04/30/2008  . GLAUCOMA, BORDERLINE 04/30/2008  . Meniere's disease 04/30/2008  . HEARING LOSS  04/30/2008  . COPD GOLD II  04/30/2008  . DJD (degenerative joint disease) 04/30/2008  . Osteoporosis 04/30/2008  . Sleep apnea 04/30/2008  . HISTORY OF ASBESTOS EXPOSURE 04/30/2008   Past Medical History:  Diagnosis Date  . Arthritis    left leg also has swelling  . Asthma   . Blood dyscrasia    pt CAN NOT have any blood thinners d/t hx brain bleed  . Chronic kidney disease, stage III (moderate) 01/20/2013  . Complication of anesthesia    confusion with anesthesia  . Constipation    miralax prn  . COPD (chronic obstructive pulmonary disease) ([facility])    emphysema  . Depression    takes wellbutrin bid  . Epidural abscess   . Gastric ulcer   . History of blood clots   . Hyperlipidemia   . Melanoma ([place])   . Peripheral neuropathy   . Pneumonia    as a child and in 1952;had a pneumonia vaccine couple of years ago  . Prosthetic joint infection ([place])   . S/P right heart catheterization    at least 66yrs  . Shingles   . Short-term memory loss   . Shortness of breath    with exertion  . Sleep apnea    has CPAP but doesn't use it;sleep study done at least 56yrs ago  . Staphylococcus aureus bacteremia with sepsis ([place])   . Subdural hematoma ([place]) 03/30/11  . Urinary frequency    wears depends    Family History  Problem Relation Age of Onset  . Melanoma Brother   . CAD Neg Hx   . Diabetes Neg Hx     Past Surgical History:  Procedure Laterality Date  . epidural abscess drainage  12/2010  . 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 EYE SURGERY     bil cataract surgery  . FILTERING PROCEDURE     filter placed in neck d/t blood  clot  . HERNIA REPAIR     25+yrs ago  . KNEE ARTHROSCOPY    . SKIN BIOPSY     melanoma on head   . TOTAL KNEE ARTHROPLASTY     x 2 left knee 2003/2012  . TOTAL KNEE REVISION  05/23/2011   Procedure: TOTAL KNEE REVISION;  Surgeon: Meredith Pel;  Location: [facility];  Service: Orthopedics;  Laterality: Left;  LEFT KNEE  REMOVAL OF SPACER, POSSIBLE REIMPLANTATION OF REVISION TKA VERSES REPLACEMENT ANTIBIOTIC SPACER   Social History   Occupational History  . Occupation: retired  Tobacco Use  . Smoking status: Former Smoker    Packs/day: 2.00    Years: 40.00    Pack years: 80.00    Types: Cigarettes    Quit date: 07/17/1978    Years since quitting: 40.8  . Smokeless tobacco: Never Used  Substance and Sexual Activity  . Alcohol use: No  . Drug use: No  . Sexual activity: Not Currently

## 2019-06-02 ENCOUNTER — Ambulatory Visit: Payer: Medicare Other | Admitting: Internal Medicine

## 2019-06-11 ENCOUNTER — Ambulatory Visit: Payer: Medicare Other | Admitting: Internal Medicine

## 2019-07-02 ENCOUNTER — Encounter: Payer: Self-pay | Admitting: Internal Medicine

## 2019-07-27 ENCOUNTER — Encounter: Payer: Self-pay | Admitting: Internal Medicine

## 2019-08-26 ENCOUNTER — Ambulatory Visit (INDEPENDENT_AMBULATORY_CARE_PROVIDER_SITE_OTHER): Payer: Medicare Other | Admitting: Internal Medicine

## 2019-08-26 ENCOUNTER — Encounter: Payer: Self-pay | Admitting: Internal Medicine

## 2019-08-26 ENCOUNTER — Other Ambulatory Visit: Payer: Self-pay

## 2019-08-26 VITALS — BP 99/63 | Ht 70.0 in | Wt 151.0 lb

## 2019-08-26 DIAGNOSIS — G2581 Restless legs syndrome: Secondary | ICD-10-CM | POA: Diagnosis not present

## 2019-08-26 DIAGNOSIS — M810 Age-related osteoporosis without current pathological fracture: Secondary | ICD-10-CM

## 2019-08-26 DIAGNOSIS — J449 Chronic obstructive pulmonary disease, unspecified: Secondary | ICD-10-CM | POA: Diagnosis not present

## 2019-08-26 DIAGNOSIS — E785 Hyperlipidemia, unspecified: Secondary | ICD-10-CM

## 2019-08-26 DIAGNOSIS — H8109 Meniere's disease, unspecified ear: Secondary | ICD-10-CM

## 2019-08-26 MED ORDER — BENZONATATE 200 MG PO CAPS: 200.0000 mg | ORAL_CAPSULE | Freq: Three times a day (TID) | ORAL | 6 refills | Status: AC | PRN

## 2019-08-26 MED ORDER — HYDROCODONE-ACETAMINOPHEN 5-325 MG PO TABS: 1.0000 | ORAL_TABLET | Freq: Two times a day (BID) | ORAL | 0 refills | Status: DC | PRN

## 2019-08-26 NOTE — Progress Notes (Signed)
Pre visit review using our clinic review tool, if applicable. No additional management support is needed unless otherwise documented below in the visit note. 

## 2019-08-26 NOTE — Progress Notes (Signed)
Subjective:    Patient ID: Micheal Frank, male    DOB: [birth year], 87 y.o.   MRN: [id number]  DOS:  08/26/2019 Type of visit - description: Virtual Visit via Telephone  Attempted  to make this a video visit, due to technical difficulties from the patient side it was not possible  thus we proceeded with a Virtual Visit via Telephone    I connected with above mentioned patient  by telephone and verified that I am speaking with the correct person using two identifiers.  THIS ENCOUNTER IS A VIRTUAL VISIT DUE TO COVID-19 - PATIENT WAS NOT SEEN IN THE OFFICE. PATIENT HAS CONSENTED TO VIRTUAL VISIT / TELEMEDICINE VISIT   Location of patient: home  Location of provider: office  I discussed the limitations, risks, security and privacy concerns of performing an evaluation and management service by telephone and the availability of in person appointments. I also discussed with the patient that there may be a patient responsible charge related to this service. The patient expressed understanding and agreed to proceed.  Follow-up Today I communicated with the patient and his daughter Micheal Frank who is very involved in his care, we went over every chronic medical problems including RLS, DJD, COPD, depression, L UTS. We review his recent labs and refilled prescriptions.  In general he is feeling well. RLS symptoms are controlled except yesterday, he had some involuntary movements of the legs and also the arms which is uncommon. Denies other symptoms.    Review of Systems See above   Past Medical History:  Diagnosis Date  . Arthritis    left leg also has swelling  . Asthma   . Blood dyscrasia    pt CAN NOT have any blood thinners d/t hx brain bleed  . Chronic kidney disease, stage III (moderate) 01/20/2013  . Complication of anesthesia    confusion with anesthesia  . Constipation    miralax prn  . COPD (chronic obstructive pulmonary disease) (HCC)    emphysema  . Depression    takes  wellbutrin bid  . Epidural abscess   . Gastric ulcer   . History of blood clots   . Hyperlipidemia   . Melanoma ([place])   . Peripheral neuropathy   . Pneumonia    as a child and in 1952;had a pneumonia vaccine couple of years ago  . Prosthetic joint infection ([place])   . S/P right heart catheterization    at least 23yrs  . Shingles   . Short-term memory loss   . Shortness of breath    with exertion  . Sleep apnea    has CPAP but doesn't use it;sleep study done at least 84yrs ago  . Staphylococcus aureus bacteremia with sepsis ([place])   . Subdural hematoma ([place]) 03/30/11  . Urinary frequency    wears depends    Past Surgical History:  Procedure Laterality Date  . epidural abscess drainage  12/2010  . 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 EYE SURGERY     bil cataract surgery  . FILTERING PROCEDURE     filter placed in neck d/t blood  clot  . HERNIA REPAIR     25+yrs ago  . KNEE ARTHROSCOPY    . SKIN BIOPSY     melanoma on head   . TOTAL KNEE ARTHROPLASTY     x 2 left knee 2003/2012  . TOTAL KNEE REVISION  05/23/2011   Procedure: TOTAL KNEE REVISION;  Surgeon: Meredith Pel;  Location: [employer] [facility];  Service: Orthopedics;  Laterality: Left;  LEFT KNEE REMOVAL OF SPACER, POSSIBLE REIMPLANTATION OF REVISION TKA VERSES REPLACEMENT ANTIBIOTIC SPACER        Objective:   Physical Exam BP 99/63   Ht 5\' 10"  (1.778 m)   Wt 151 lb (68.5 kg)   SpO2 96%   BMI 21.67 kg/m  This is a virtual telephone visit, he sounded well, alert, oriented x3, in no distress, speaking in complete sentences.    Assessment      Assessment   COPD- Dr Melvyn Novas 08-2015, stable, RTC PRN CKD -----Creatinine 1.67, GFR 32 Hyperlipidemia Depression, anxiety  Dyspepsia: Chronic, declined a GI referral in 2017 after sxs decreased with PPIs empirically NEURO: --Decreased memory --Anisocoria- L pupil larger ,s/p surgery --Neuropathy --H/o Mnire's  -- on diuretics , valium,  meclizine Osteoporosis -- h/o pelvic Fx, dexa April 2016: T score (-) 2.3;  rx fosamax 03-2015, dc after 3 months d/t dysphagia; prolia #1 05-03-16 DJD  -- hydrocodone rarely RLS OSA dx remotely, used a CPAP, wt loss and cpap d/c ~ 2012 (Dr Maxwell Caul) GU: --H/o incontinence, saw Dr Karsten Ro, OV ~ 2013 --Urinary retention 02-2016: sepsis, admitted, had a foley temporarily  H/o melanoma H/o subdural hematoma, epidural abscess, staph aureus  sepsis H/o Prosthetic joint infection H/o Blood clots H/o gastric ulcer C diff dx 10-19-15, s/p flagyl x 2 , diarrhea again 03-2016, better after vancomycin. Coordinate care with his daughter Micheal Frank  [phone]  PLAN: COPD: Currently on Symbicort and albuterol. He tried  [employer] but he could not tolerate it.  Currently symptoms are controlled.  Uses albuterol only as needed.  Request Tessalon Perles to have just in case CKD: Last BMP satisfactory Hyperlipidemia: On simvastatin, well controlled.  Recheck on RTC. Depression anxiety: Currently on Wellbutrin, reports his mood is okay. Mnire's: On half Maxide daily to prevent symptoms, hardly ever uses Valium/meclizine, the last time was 6 months ago. Osteoporosis: Last DEXA 03-2019, T score - 1.8, good response to treatment, Next Prolia due, will arrange in 3 to 4 weeks from today. DJD: On Tylenol most days, would like to have some hydrocodone in case he needs it, Rx sent. RLS: On Requip 3 times daily, typically 1 control, yesterday he had some involuntary movements in the hands, that is atypical, observation for now. BP today slightly low, typically normal, they will check in the next few days and let me know if it is persistently low.  No symptoms. RTC 4 months     I discussed the assessment and treatment plan with the patient. The patient was provided an opportunity to ask questions and all were answered. The patient agreed with the plan and demonstrated an understanding of the instructions.   The  patient was advised to call back or seek an in-person evaluation if the symptoms worsen or if the condition fails to improve as anticipated.  I provided 23 minutes of non-face-to-face time during this encounter.  Kathlene November, MD

## 2019-08-27 ENCOUNTER — Ambulatory Visit: Payer: Medicare Other | Admitting: Internal Medicine

## 2019-08-27 NOTE — Assessment & Plan Note (Signed)
COPD: Currently on Symbicort and albuterol. He tried  [employer] but he could not tolerate it.  Currently symptoms are controlled.  Uses albuterol only as needed.  Request Tessalon Perles to have just in case CKD: Last BMP satisfactory Hyperlipidemia: On simvastatin, well controlled.  Recheck on RTC. Depression anxiety: Currently on Wellbutrin, reports his mood is okay. Mnire's: On half Maxide daily to prevent symptoms, hardly ever uses Valium/meclizine, the last time was 6 months ago. Osteoporosis: Last DEXA 03-2019, T score - 1.8, good response to treatment, Next Prolia due, will arrange in 3 to 4 weeks from today. DJD: On Tylenol most days, would like to have some hydrocodone in case he needs it, Rx sent. RLS: On Requip 3 times daily, typically 1 control, yesterday he had some involuntary movements in the hands, that is atypical, observation for now. BP today slightly low, typically normal, they will check in the next few days and let me know if it is persistently low.  No symptoms. RTC 4 months

## 2019-09-09 ENCOUNTER — Other Ambulatory Visit: Payer: Self-pay | Admitting: Internal Medicine

## 2019-09-10 ENCOUNTER — Ambulatory Visit (HOSPITAL_BASED_OUTPATIENT_CLINIC_OR_DEPARTMENT_OTHER)
Admission: RE | Admit: 2019-09-10 | Discharge: 2019-09-10 | Disposition: A | Discharge status: Home / Self Care | Payer: Medicare Other | Source: Ambulatory Visit | Attending: Internal Medicine | Admitting: Internal Medicine

## 2019-09-10 ENCOUNTER — Other Ambulatory Visit: Payer: Self-pay

## 2019-09-10 ENCOUNTER — Ambulatory Visit: Payer: Medicare Other | Admitting: Internal Medicine

## 2019-09-10 ENCOUNTER — Encounter: Payer: Self-pay | Admitting: Internal Medicine

## 2019-09-10 VITALS — BP 155/73 | HR 71 | Temp 98.0°F | Resp 18 | Ht 70.0 in | Wt 151.0 lb

## 2019-09-10 DIAGNOSIS — M810 Age-related osteoporosis without current pathological fracture: Secondary | ICD-10-CM

## 2019-09-10 DIAGNOSIS — J449 Chronic obstructive pulmonary disease, unspecified: Secondary | ICD-10-CM

## 2019-09-10 DIAGNOSIS — J441 Chronic obstructive pulmonary disease with (acute) exacerbation: Secondary | ICD-10-CM

## 2019-09-10 LAB — BASIC METABOLIC PANEL
BUN: 18 mg/dL (ref 6–23)
CO2: 29 mEq/L (ref 19–32)
Calcium: 9 mg/dL (ref 8.4–10.5)
Chloride: 106 mEq/L (ref 96–112)
Creatinine, Ser: 1.48 mg/dL (ref 0.40–1.50)
GFR: 44.44 mL/min — ABNORMAL LOW (ref >60.00)
Glucose, Bld: 69 mg/dL — ABNORMAL LOW (ref 70–99)
Potassium: 3.9 mEq/L (ref 3.5–5.1)
Sodium: 142 mEq/L (ref 135–145)

## 2019-09-10 LAB — CBC WITH DIFFERENTIAL/PLATELET
Basophils Absolute: 0 10*3/uL (ref 0.0–0.1)
Basophils Relative: 0.5 % (ref 0.0–3.0)
Eosinophils Absolute: 0.4 10*3/uL (ref 0.0–0.7)
Eosinophils Relative: 4.1 % (ref 0.0–5.0)
HCT: 39.3 % (ref 39.0–52.0)
Hemoglobin: 12.9 g/dL — ABNORMAL LOW (ref 13.0–17.0)
Lymphocytes Relative: 15.4 % (ref 12.0–46.0)
Lymphs Abs: 1.4 10*3/uL (ref 0.7–4.0)
MCHC: 32.8 g/dL (ref 30.0–36.0)
MCV: 96.4 fl (ref 78.0–100.0)
Monocytes Absolute: 1.3 10*3/uL — ABNORMAL HIGH (ref 0.1–1.0)
Monocytes Relative: 14.9 % — ABNORMAL HIGH (ref 3.0–12.0)
Neutro Abs: 5.9 10*3/uL (ref 1.4–7.7)
Neutrophils Relative %: 65.1 % (ref 43.0–77.0)
Platelets: 265 10*3/uL (ref 150.0–400.0)
RBC: 4.07 Mil/uL — ABNORMAL LOW (ref 4.22–5.81)
RDW: 13.6 % (ref 11.5–15.5)
WBC: 9.1 10*3/uL (ref 4.0–10.5)

## 2019-09-10 MED ORDER — DENOSUMAB 60 MG/ML ~~LOC~~ SOSY
60.0000 mg | PREFILLED_SYRINGE | Freq: Once | SUBCUTANEOUS | Status: AC
  Administered 2019-09-10: 14:00:00 60 mg via SUBCUTANEOUS

## 2019-09-10 MED ORDER — IPRATROPIUM-ALBUTEROL 0.5-2.5 (3) MG/3ML IN SOLN: 3.0000 mL | Freq: Four times a day (QID) | RESPIRATORY_TRACT | 5 refills | Status: DC | PRN

## 2019-09-10 NOTE — Progress Notes (Signed)
Subjective:    Patient ID: Micheal Frank, male    DOB: 20-Feb-1926, 86 y.o.   MRN: [id number]  DOS:  09/10/2019 Type of visit - description: Acute Here which he is daughter. He has a history of COPD and chronic dyspnea on exertion. For the last week or so, he has noted increased DOE, he reports as he is more "cognitive" of his DOE.  O2 sats are checked regularly, few days ago it was as low as 89% but today has been 93% and  98% at home.   BP Readings from Last 3 Encounters:  09/10/19 (!) 155/73  08/26/19 99/63  03/11/19 (!) 145/64   Wt Readings from Last 3 Encounters:  09/10/19 151 lb (68.5 kg)  08/26/19 151 lb (68.5 kg)  03/11/19 152 lb (68.9 kg)     Review of Systems No fever chills Lower extremity edema at baseline Mild cough, dry, on Tessalon Perles as needed. Very mild GERD symptoms from time to time  Past Medical History:  Diagnosis Date  . Arthritis    left leg also has swelling  . Asthma   . Blood dyscrasia    pt CAN NOT have any blood thinners d/t hx brain bleed  . Chronic kidney disease, stage III (moderate) 01/20/2013  . Complication of anesthesia    confusion with anesthesia  . Constipation    miralax prn  . COPD (chronic obstructive pulmonary disease) (HCC)    emphysema  . Depression    takes wellbutrin bid  . Epidural abscess   . Gastric ulcer   . History of blood clots   . Hyperlipidemia   . Melanoma ([place])   . Peripheral neuropathy   . Pneumonia    as a child and in 1952;had a pneumonia vaccine couple of years ago  . Prosthetic joint infection ([place])   . S/P right heart catheterization    at least 8yrs  . Shingles   . Short-term memory loss   . Shortness of breath    with exertion  . Sleep apnea    has CPAP but doesn't use it;sleep study done at least 24yrs ago  . Staphylococcus aureus bacteremia with sepsis ([place])   . Subdural hematoma ([place]) 03/30/11  . Urinary frequency    wears depends    Past Surgical History:  Procedure  Laterality Date  . epidural abscess drainage  12/2010  . 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 EYE SURGERY     bil cataract surgery  . FILTERING PROCEDURE     filter placed in neck d/t blood  clot  . HERNIA REPAIR     25+yrs ago  . KNEE ARTHROSCOPY    . SKIN BIOPSY     melanoma on head   . TOTAL KNEE ARTHROPLASTY     x 2 left knee 2003/2012  . TOTAL KNEE REVISION  05/23/2011   Procedure: TOTAL KNEE REVISION;  Surgeon: Meredith Pel;  Location: [facility];  Service: Orthopedics;  Laterality: Left;  LEFT KNEE REMOVAL OF SPACER, POSSIBLE REIMPLANTATION OF REVISION TKA VERSES REPLACEMENT ANTIBIOTIC SPACER    Allergies as of 09/10/2019      Reactions   Anticoagulant Compound    History of bleeding on the brain per daughter   Protonix [pantoprazole Sodium] Diarrhea      Medication List       Accurate as of September 10, 2019 11:59 PM. If you have any questions, ask your  nurse or doctor.        acetaminophen 500 MG tablet Commonly known as: TYLENOL Take 2 tablets (1,000 mg total) by mouth every 8 (eight) hours as needed for mild pain.   albuterol 108 (90 Base) MCG/ACT inhaler Commonly known as: ProAir HFA Inhale 2 puffs into the lungs every 6 (six) hours as needed for wheezing.   azithromycin 250 MG tablet Commonly known as: ZITHROMAX Take 2 tablets by mouth first day, then 1 tablet for 4 additional days Started by: Kathlene November, MD   benzonatate 200 MG capsule Commonly known as: TESSALON Take 1 capsule (200 mg total) by mouth 3 (three) times daily as needed for cough.   budesonide-formoterol 160-4.5 MCG/ACT inhaler Commonly known as: SYMBICORT Inhale 2 puffs into the lungs 2 (two) times daily.   buPROPion 200 MG 12 hr tablet Commonly known as: WELLBUTRIN SR Take 1 tablet (200 mg total) by mouth 2 (two) times daily.   calcium carbonate 600 MG Tabs tablet Commonly known as: OS-CAL Take 600 mg by mouth daily.   diazepam 5 MG tablet Commonly  known as: VALIUM Take 1 tablet (5 mg total) by mouth every 6 (six) hours as needed for anxiety.   doxazosin 4 MG tablet Commonly known as: CARDURA Take 1 tablet (4 mg total) by mouth at bedtime.   famotidine 10 MG tablet Commonly known as: Pepcid AC Take 1 tablet (10 mg total) by mouth 2 (two) times daily.   fluticasone 50 MCG/ACT nasal spray Commonly known as: FLONASE Place 2 sprays into both nostrils 2 (two) times daily.   HYDROcodone-acetaminophen 5-325 MG tablet Commonly known as: NORCO/VICODIN Take 1 tablet by mouth 2 (two) times daily as needed for moderate pain.   ipratropium-albuterol 0.5-2.5 (3) MG/3ML Soln Commonly known as: DUONEB Take 3 mLs by nebulization 4 (four) times daily as needed. Started by: Kathlene November, MD   meclizine 25 MG tablet Commonly known as: ANTIVERT Take 1 tablet (25 mg total) by mouth daily as needed (vertigo).   montelukast 10 MG tablet Commonly known as: SINGULAIR Take 1 tablet (10 mg total) by mouth daily.   multivitamin with minerals Tabs tablet Take 1 tablet by mouth daily.   rOPINIRole 1 MG tablet Commonly known as: REQUIP Take 1 tablet (1 mg total) by mouth 3 (three) times daily.   senna-docusate 8.6-50 MG tablet Commonly known as: Senokot-S Take 1 tablet by mouth 2 (two) times daily.   simvastatin 40 MG tablet Commonly known as: ZOCOR Take 1 tablet (40 mg total) by mouth daily.   tamsulosin 0.4 MG Caps capsule Commonly known as: FLOMAX Take 1 capsule (0.4 mg total) by mouth daily.   triamterene-hydrochlorothiazide 37.5-25 MG tablet Commonly known as: MAXZIDE-25 Take 0.5 tablets by mouth daily.            Durable Medical Equipment  (From admission, onward)         Start     Ordered   09/10/19 0000  For home use only DME Nebulizer machine    Question Answer Comment  Patient needs a nebulizer to treat with the following condition COPD (chronic obstructive pulmonary disease) ([place])   Length of Need Lifetime       09/10/19 1350                Objective:   Physical Exam BP (!) 155/73 (BP Location: Left Arm, Patient Position: Sitting, Cuff Size: Normal)   Pulse 71   Temp 98 F (36.7 C) (Temporal)  Resp 18   Ht 5\' 10"  (1.778 m)   Wt 151 lb (68.5 kg)   SpO2 96%   BMI 21.67 kg/m  General:   Well developed, NAD, BMI noted. HEENT:  Normocephalic . Face symmetric, atraumatic Lungs:  Decreased breath sounds throughout, no wheezing, no crackles.  Few rhonchi with cough. Normal respiratory effort, no intercostal retractions, no accessory muscle use. Heart: RRR,  no murmur.  Lower extremities:  +/+++  + Edema, more noticeable on the right pretibial area.   Skin: Not pale. Not jaundice Neurologic:  alert & oriend X3.  Speech normal, gait not tested  Psych--  Cognition and judgment appear intact.  Cooperative with normal attention span and concentration.  Behavior appropriate. No anxious or depressed appearing.      Assessment     Assessment   COPD- Dr Melvyn Novas 08-2015, stable, RTC PRN CKD -----Creatinine 1.67, GFR 32 Hyperlipidemia Depression, anxiety  Dyspepsia: Chronic, declined a GI referral in 2017 after sxs decreased with PPIs empirically NEURO: --Decreased memory --Anisocoria- L pupil larger ,s/p surgery --Neuropathy --H/o Mnire's  -- on diuretics , valium, meclizine Osteoporosis -- h/o pelvic Fx, dexa April 2016: T score (-) 2.3;  rx fosamax 03-2015, dc after 3 months d/t dysphagia; prolia #1 05-03-16 DJD  -- hydrocodone rarely RLS OSA dx remotely, used a CPAP, wt loss and cpap d/c ~ 2012 (Dr Maxwell Caul) GU: --H/o incontinence, saw Dr Karsten Ro, OV ~ 2013 --Urinary retention 02-2016: sepsis, admitted, had a foley temporarily  H/o melanoma H/o subdural hematoma, epidural abscess, staph aureus  sepsis H/o Prosthetic joint infection H/o Blood clots H/o gastric ulcer C diff dx 10-19-15, s/p flagyl x 2 , diarrhea again 03-2016, better after vancomycin. Coordinate care with his  daughter Micheal Frank  [phone]  PLAN: COPD exacerbation: Increased DOE for the last several days.  No fever chills, he does not seem to be acutely ill. O2 sat was slightly low at home last week but now is normal. Increase DOE could be natural course of COPD. He does not seem to be volume overloaded.  Intolerant to [employer]. Plan: BMP, CBC, chest x-ray Continue Symbicort, hold albuterol Start nebulizations with DuoNeb, twice daily, 2 extra nebulizations as needed throughout the day. Patient is to be nebulize by himself in the room as a Covid precaution although he and his daughter have been vaccinated. Continue monitoring O2 sats at home, call if they are consistently low If not improving, consider switching Symbicort to steroid nebs/ Perforomist, or a pulmonary referral. Osteoporosis: Prolia today RTC 6 weeks   This visit occurred during the SARS-CoV-2 public health emergency.  Safety protocols were in place, including screening questions prior to the visit, additional usage of staff PPE, and extensive cleaning of exam room while observing appropriate contact time as indicated for disinfecting solutions.

## 2019-09-10 NOTE — Patient Instructions (Addendum)
GO TO THE LAB : Get the blood work     GO TO [place] back for a checkup in 6 weeks, please make an appointment   STOP BY THE FIRST FLOOR:  get the XR   Continue checking the oxygen levels, call if it is consistently less than 94%  Continue Symbicort  Hold albuterol inhaler  Start nebulizations with DuoNeb. Twice a day every day, okay to take extra nebulization if needed.  Call in 2 weeks and let me know how this is working for him

## 2019-09-10 NOTE — Progress Notes (Signed)
Pre visit review using our clinic review tool, if applicable. No additional management support is needed unless otherwise documented below in the visit note. 

## 2019-09-11 ENCOUNTER — Encounter: Payer: Self-pay | Admitting: Internal Medicine

## 2019-09-11 MED ORDER — AZITHROMYCIN 250 MG PO TABS: ORAL_TABLET | ORAL | 0 refills | Status: DC

## 2019-09-11 NOTE — Assessment & Plan Note (Addendum)
COPD exacerbation: Increased DOE for the last several days.  No fever chills, he does not seem to be acutely ill. O2 sat was slightly low at home last week but now is normal. Increase DOE could be natural course of COPD. He does not seem to be volume overloaded.  Intolerant to [employer]. Plan: BMP, CBC, chest x-ray Continue Symbicort, hold albuterol Start nebulizations with DuoNeb, twice daily, 2 extra nebulizations as needed throughout the day. Patient is to be nebulize by himself in the room as a Covid precaution although he and his daughter have been vaccinated. Continue monitoring O2 sats at home, call if they are consistently low If not improving, consider switching Symbicort to steroid nebs/ Perforomist, or a pulmonary referral. Osteoporosis: Prolia today RTC 6 weeks

## 2019-09-17 ENCOUNTER — Other Ambulatory Visit: Payer: Self-pay | Admitting: Internal Medicine

## 2019-09-26 ENCOUNTER — Telehealth: Payer: Self-pay | Admitting: Internal Medicine

## 2019-09-26 DIAGNOSIS — Z8701 Personal history of pneumonia (recurrent): Secondary | ICD-10-CM

## 2019-09-26 NOTE — Telephone Encounter (Signed)
Pt's daughter Jobe Criste stated Dr. Larose Kells told her to call back and speak to his CMA  if his cough hasn't gotten better. Pt was offered a Saturday Virtual but she feels like another xray be needs to be done. And. Please advise

## 2019-09-26 NOTE — Telephone Encounter (Signed)
Daughter is unable to get father in today, but she will bring him by tomorrow for cxr.  She will take him to ER if symptoms get worse.

## 2019-09-26 NOTE — Telephone Encounter (Signed)
Okay, arrange for a chest x-ray, can be done today, DX follow-up PNM. At the same time, if he is not better I need to see him again, schedule a visit for next week. ER if fever, chills or severe symptoms.

## 2019-09-27 ENCOUNTER — Other Ambulatory Visit: Payer: Self-pay

## 2019-09-27 ENCOUNTER — Ambulatory Visit (HOSPITAL_BASED_OUTPATIENT_CLINIC_OR_DEPARTMENT_OTHER)
Admission: RE | Admit: 2019-09-27 | Discharge: 2019-09-27 | Disposition: A | Discharge status: Home / Self Care | Payer: Medicare Other | Source: Ambulatory Visit | Attending: Internal Medicine | Admitting: Internal Medicine

## 2019-09-27 DIAGNOSIS — Z8701 Personal history of pneumonia (recurrent): Secondary | ICD-10-CM | POA: Diagnosis not present

## 2019-09-29 ENCOUNTER — Encounter: Payer: Self-pay | Admitting: Internal Medicine

## 2019-09-30 ENCOUNTER — Emergency Department (HOSPITAL_COMMUNITY): Payer: Medicare Other

## 2019-09-30 ENCOUNTER — Encounter (HOSPITAL_COMMUNITY): Payer: Self-pay | Admitting: Emergency Medicine

## 2019-09-30 ENCOUNTER — Inpatient Hospital Stay (HOSPITAL_COMMUNITY)
Admission: EM | Admit: 2019-09-30 | Discharge: 2019-10-12 | DRG: 200 | Disposition: A | Discharge status: Hospice | Payer: Medicare Other | Attending: General Surgery | Admitting: General Surgery

## 2019-09-30 ENCOUNTER — Other Ambulatory Visit: Payer: Self-pay

## 2019-09-30 DIAGNOSIS — S2241XA Multiple fractures of ribs, right side, initial encounter for closed fracture: Secondary | ICD-10-CM | POA: Diagnosis not present

## 2019-09-30 DIAGNOSIS — Z79899 Other long term (current) drug therapy: Secondary | ICD-10-CM

## 2019-09-30 DIAGNOSIS — J432 Centrilobular emphysema: Secondary | ICD-10-CM

## 2019-09-30 DIAGNOSIS — Z20822 Contact with and (suspected) exposure to covid-19: Secondary | ICD-10-CM | POA: Diagnosis present

## 2019-09-30 DIAGNOSIS — F05 Delirium due to known physiological condition: Secondary | ICD-10-CM | POA: Diagnosis not present

## 2019-09-30 DIAGNOSIS — S301XXA Contusion of abdominal wall, initial encounter: Secondary | ICD-10-CM | POA: Diagnosis present

## 2019-09-30 DIAGNOSIS — J942 Hemothorax: Secondary | ICD-10-CM

## 2019-09-30 DIAGNOSIS — W19XXXA Unspecified fall, initial encounter: Secondary | ICD-10-CM | POA: Diagnosis present

## 2019-09-30 DIAGNOSIS — S271XXA Traumatic hemothorax, initial encounter: Principal | ICD-10-CM | POA: Diagnosis present

## 2019-09-30 DIAGNOSIS — Z86718 Personal history of other venous thrombosis and embolism: Secondary | ICD-10-CM

## 2019-09-30 DIAGNOSIS — E785 Hyperlipidemia, unspecified: Secondary | ICD-10-CM | POA: Diagnosis present

## 2019-09-30 DIAGNOSIS — J939 Pneumothorax, unspecified: Secondary | ICD-10-CM | POA: Diagnosis not present

## 2019-09-30 DIAGNOSIS — Z87891 Personal history of nicotine dependence: Secondary | ICD-10-CM

## 2019-09-30 DIAGNOSIS — R140 Abdominal distension (gaseous): Secondary | ICD-10-CM

## 2019-09-30 DIAGNOSIS — Z79891 Long term (current) use of opiate analgesic: Secondary | ICD-10-CM

## 2019-09-30 DIAGNOSIS — G2581 Restless legs syndrome: Secondary | ICD-10-CM | POA: Diagnosis present

## 2019-09-30 DIAGNOSIS — N183 Chronic kidney disease, stage 3 unspecified: Secondary | ICD-10-CM | POA: Diagnosis present

## 2019-09-30 DIAGNOSIS — J439 Emphysema, unspecified: Secondary | ICD-10-CM | POA: Diagnosis present

## 2019-09-30 DIAGNOSIS — Z7951 Long term (current) use of inhaled steroids: Secondary | ICD-10-CM

## 2019-09-30 DIAGNOSIS — R627 Adult failure to thrive: Secondary | ICD-10-CM | POA: Diagnosis present

## 2019-09-30 DIAGNOSIS — R339 Retention of urine, unspecified: Secondary | ICD-10-CM | POA: Diagnosis present

## 2019-09-30 DIAGNOSIS — Z938 Other artificial opening status: Secondary | ICD-10-CM

## 2019-09-30 DIAGNOSIS — S272XXA Traumatic hemopneumothorax, initial encounter: Secondary | ICD-10-CM

## 2019-09-30 DIAGNOSIS — Z66 Do not resuscitate: Secondary | ICD-10-CM | POA: Diagnosis present

## 2019-09-30 DIAGNOSIS — Z515 Encounter for palliative care: Secondary | ICD-10-CM

## 2019-09-30 DIAGNOSIS — Z96652 Presence of left artificial knee joint: Secondary | ICD-10-CM | POA: Diagnosis present

## 2019-09-30 DIAGNOSIS — Z85820 Personal history of malignant melanoma of skin: Secondary | ICD-10-CM

## 2019-09-30 DIAGNOSIS — Z7189 Other specified counseling: Secondary | ICD-10-CM

## 2019-09-30 DIAGNOSIS — J9690 Respiratory failure, unspecified, unspecified whether with hypoxia or hypercapnia: Secondary | ICD-10-CM

## 2019-09-30 DIAGNOSIS — Z8711 Personal history of peptic ulcer disease: Secondary | ICD-10-CM

## 2019-09-30 DIAGNOSIS — H9190 Unspecified hearing loss, unspecified ear: Secondary | ICD-10-CM | POA: Diagnosis present

## 2019-09-30 DIAGNOSIS — Z8701 Personal history of pneumonia (recurrent): Secondary | ICD-10-CM

## 2019-09-30 DIAGNOSIS — W010XXA Fall on same level from slipping, tripping and stumbling without subsequent striking against object, initial encounter: Secondary | ICD-10-CM | POA: Diagnosis present

## 2019-09-30 LAB — CBC WITH DIFFERENTIAL/PLATELET
Abs Immature Granulocytes: 0.13 10*3/uL — ABNORMAL HIGH (ref 0.00–0.07)
Basophils Absolute: 0 10*3/uL (ref 0.0–0.1)
Basophils Relative: 0 %
Eosinophils Absolute: 0.1 10*3/uL (ref 0.0–0.5)
Eosinophils Relative: 1 %
HCT: 40.7 % (ref 39.0–52.0)
Hemoglobin: 13 g/dL (ref 13.0–17.0)
Immature Granulocytes: 1 %
Lymphocytes Relative: 4 %
Lymphs Abs: 0.8 10*3/uL (ref 0.7–4.0)
MCH: 32.4 pg (ref 26.0–34.0)
MCHC: 31.9 g/dL (ref 30.0–36.0)
MCV: 101.5 fL — ABNORMAL HIGH (ref 80.0–100.0)
Monocytes Absolute: 1.5 10*3/uL — ABNORMAL HIGH (ref 0.1–1.0)
Monocytes Relative: 8 %
Neutro Abs: 16.5 10*3/uL — ABNORMAL HIGH (ref 1.7–7.7)
Neutrophils Relative %: 86 %
Platelets: 246 10*3/uL (ref 150–400)
RBC: 4.01 MIL/uL — ABNORMAL LOW (ref 4.22–5.81)
RDW: 14.1 % (ref 11.5–15.5)
WBC: 19 10*3/uL — ABNORMAL HIGH (ref 4.0–10.5)
nRBC: 0 % (ref 0.0–0.2)

## 2019-09-30 LAB — BASIC METABOLIC PANEL
Anion gap: 6 (ref 5–15)
BUN: 17 mg/dL (ref 8–23)
CO2: 23 mmol/L (ref 22–32)
Calcium: 7.8 mg/dL — ABNORMAL LOW (ref 8.9–10.3)
Chloride: 113 mmol/L — ABNORMAL HIGH (ref 98–111)
Creatinine, Ser: 1.54 mg/dL — ABNORMAL HIGH (ref 0.61–1.24)
GFR calc Af Amer: 45 mL/min — ABNORMAL LOW (ref >60)
GFR calc non Af Amer: 39 mL/min — ABNORMAL LOW (ref >60)
Glucose, Bld: 135 mg/dL — ABNORMAL HIGH (ref 70–99)
Potassium: 4.5 mmol/L (ref 3.5–5.1)
Sodium: 142 mmol/L (ref 135–145)

## 2019-09-30 MED ORDER — FENTANYL CITRATE (PF) 100 MCG/2ML IJ SOLN
50.0000 ug | Freq: Once | INTRAMUSCULAR | Status: AC
  Administered 2019-09-30: 50 ug via INTRAVENOUS
  Filled 2019-09-30: qty 2

## 2019-09-30 MED ORDER — IOHEXOL 300 MG/ML  SOLN
100.0000 mL | Freq: Once | INTRAMUSCULAR | Status: AC | PRN
  Administered 2019-09-30: 75 mL via INTRAVENOUS

## 2019-09-30 MED ORDER — FENTANYL CITRATE (PF) 100 MCG/2ML IJ SOLN
50.0000 ug | Freq: Once | INTRAMUSCULAR | Status: AC
  Administered 2019-10-01: 50 ug via INTRAVENOUS
  Filled 2019-09-30: qty 2

## 2019-09-30 MED ORDER — SODIUM CHLORIDE (PF) 0.9 % IJ SOLN
INTRAMUSCULAR | Status: AC
  Filled 2019-09-30: qty 50

## 2019-09-30 MED ORDER — ONDANSETRON HCL 4 MG/2ML IJ SOLN
4.0000 mg | Freq: Once | INTRAMUSCULAR | Status: AC
  Administered 2019-09-30: 4 mg via INTRAVENOUS
  Filled 2019-09-30: qty 2

## 2019-09-30 NOTE — ED Triage Notes (Signed)
Per EMS, Pt fell while trying to stand up. No trauma, or injuries noted. Pt complaining of right sided flank pain. Pt at 88% RA on scene, pt has hx of COPD, says he does not use o2 at home. Pts family stated he was being treated for a cough, also stated he was tested for covid because of the cough and was negative, unable to say when the test occurred.

## 2019-09-30 NOTE — ED Notes (Signed)
Pt has uneven pupils; left pupil is larger than the right; he states that this is from several years ago, and not a new occurrence from this fall.

## 2019-09-30 NOTE — ED Provider Notes (Signed)
[facility] DEPT Provider Note   CSN: [id number] Arrival date & time: 09/30/19  2049     History Chief Complaint  Patient presents with  . Fall  . Flank Pain    Micheal Frank is a 86 y.o. male.  Pt presents to the ED today with right sided cp.  The pt was closing his blinds before he went to sleep.  He tripped on his feet and fell onto his right side.  He has a lot of pain to the right chest wall.  Pt has a hx of COPD, but does not usually wear oxygen.  Pt's family called EMS.  Pt's RA O2 sat 88% on RA.  Pt put on 2L oxygen and sats are in the mid-90s.         Past Medical History:  Diagnosis Date  . Arthritis    left leg also has swelling  . Asthma   . Blood dyscrasia    pt CAN NOT have any blood thinners d/t hx brain bleed  . Chronic kidney disease, stage III (moderate) 01/20/2013  . Complication of anesthesia    confusion with anesthesia  . Constipation    miralax prn  . COPD (chronic obstructive pulmonary disease) (HCC)    emphysema  . Depression    takes wellbutrin bid  . Epidural abscess   . Gastric ulcer   . History of blood clots   . Hyperlipidemia   . Melanoma ([place])   . Peripheral neuropathy   . Pneumonia    as a child and in 1952;had a pneumonia vaccine couple of years ago  . Prosthetic joint infection ([place])   . S/P right heart catheterization    at least 61yrs  . Shingles   . Short-term memory loss   . Shortness of breath    with exertion  . Sleep apnea    has CPAP but doesn't use it;sleep study done at least 47yrs ago  . Staphylococcus aureus bacteremia with sepsis ([place])   . Subdural hematoma ([place]) 03/30/11  . Urinary frequency    wears depends    Patient Active Problem List   Diagnosis Date Noted  . Annual physical exam 03/12/2019  . Multiple rib fractures 01/17/2018  . Essential hypertension 01/16/2018  . BPH (benign prostatic hyperplasia) 01/16/2018  . Compression fracture of T12 vertebra ([place])  06/24/2017  . Generalized weakness 06/24/2017  . Presence of left artificial knee joint 07/24/2016  . Chronic pain of left knee 07/24/2016  . Chronic midline low back pain without sciatica 07/24/2016  . Anxiety and depression 07/09/2016  . C. difficile diarrhea 05/04/2016  . Chronic cough 08/16/2015  . PCP NOTES >>>>>> 04/06/2015  . CTS (carpal tunnel syndrome) 11/16/2014  . Hyperlipidemia 08/17/2014  . Superficial bruising of chest wall 07/29/2014  . Urgency incontinence 01/23/2013  . Chronic kidney disease, stage III (moderate) ([place]) 01/20/2013  . At high risk for falls 11/28/2012  . ALLERGIC RHINITIS 12/09/2009  . Carcinoma in situ of skin 04/30/2008  . RESTLESS LEG SYNDROME 04/30/2008  . NEUROPATHY 04/30/2008  . GLAUCOMA, BORDERLINE 04/30/2008  . Meniere's disease 04/30/2008  . HEARING LOSS 04/30/2008  . COPD GOLD II  04/30/2008  . DJD (degenerative joint disease) 04/30/2008  . Osteoporosis 04/30/2008  . Sleep apnea 04/30/2008  . HISTORY OF ASBESTOS EXPOSURE 04/30/2008    Past Surgical History:  Procedure Laterality Date  . epidural abscess drainage  12/2010  . EXTERNAL EAR SURGERY     shunt placed in  right ear d/t mennieres   . EYE SURGERY     bil cataract surgery  . FILTERING PROCEDURE     filter placed in neck d/t blood  clot  . HERNIA REPAIR     25+yrs ago  . KNEE ARTHROSCOPY    . SKIN BIOPSY     melanoma on head   . TOTAL KNEE ARTHROPLASTY     x 2 left knee 2003/2012  . TOTAL KNEE REVISION  05/23/2011   Procedure: TOTAL KNEE REVISION;  Surgeon: Meredith Pel;  Location: [facility];  Service: Orthopedics;  Laterality: Left;  LEFT KNEE REMOVAL OF SPACER, POSSIBLE REIMPLANTATION OF REVISION TKA VERSES REPLACEMENT ANTIBIOTIC SPACER       Family History  Problem Relation Age of Onset  . Melanoma Brother   . CAD Neg Hx   . Diabetes Neg Hx     Social History   Tobacco Use  . Smoking status: Former Smoker    Packs/day: 2.00    Years: 40.00    Pack years:  80.00    Types: Cigarettes    Quit date: 07/17/1978    Years since quitting: 41.2  . Smokeless tobacco: Never Used  Substance Use Topics  . Alcohol use: No  . Drug use: No    Home Medications Prior to Admission medications   Medication Sig Start Date End Date Taking? Authorizing Provider  acetaminophen (TYLENOL) 500 MG tablet Take 2 tablets (1,000 mg total) by mouth every 8 (eight) hours as needed for mild pain. 01/22/18   Elgergawy, Silver Huguenin, MD  albuterol (PROAIR HFA) 108 (90 BASE) MCG/ACT inhaler Inhale 2 puffs into the lungs every 6 (six) hours as needed for wheezing. 07/31/14   Elsie Stain, MD  azithromycin (ZITHROMAX) 250 MG tablet Take 2 tablets by mouth first day, then 1 tablet for 4 additional days 09/11/19   Colon Branch, MD  benzonatate (TESSALON) 200 MG capsule Take 1 capsule (200 mg total) by mouth 3 (three) times daily as needed for cough. 08/26/19   Colon [facility], MD  budesonide-formoterol [facility]) 160-4.5 MCG/ACT inhaler Inhale 2 puffs into the lungs 2 (two) times daily. 07/05/17   Colon Branch, MD  buPROPion (WELLBUTRIN SR) 200 MG 12 hr tablet Take 1 tablet (200 mg total) by mouth 2 (two) times daily. 09/09/19   Colon [facility], MD  calcium carbonate (OS-CAL) 600 MG TABS Take 600 mg by mouth daily.     [provider]  diazepam (VALIUM) 5 MG tablet Take 1 tablet (5 mg total) by mouth every 6 (six) hours as needed for anxiety. 08/15/18   Colon Branch, MD  doxazosin (CARDURA) 4 MG tablet Take 1 tablet (4 mg total) by mouth at bedtime. 09/17/19   Colon Branch, MD  famotidine (PEPCID AC) 10 MG tablet Take 1 tablet (10 mg total) by mouth 2 (two) times daily. 06/19/18   Colon Branch, MD  fluticasone [facility]) 50 MCG/ACT nasal spray Place 2 sprays into both nostrils 2 (two) times daily. 07/05/17   Colon Branch, MD  HYDROcodone-acetaminophen (NORCO/VICODIN) 5-325 MG tablet Take 1 tablet by mouth 2 (two) times daily as needed for moderate pain. 08/26/19   Colon Branch, MD   ipratropium-albuterol (DUONEB) 0.5-2.5 (3) MG/3ML SOLN Take 3 mLs by nebulization 4 (four) times daily as needed. 09/10/19   Colon Branch, MD  meclizine (ANTIVERT) 25 MG tablet Take 1 tablet (25 mg total) by mouth daily as needed (vertigo). 08/15/18  Paz, Jacqulyn Bath E, MD  montelukast (SINGULAIR) 10 MG tablet Take 1 tablet (10 mg total) by mouth daily. 03/31/19   Colon Branch, MD  Multiple Vitamin (MULTIVITAMIN WITH MINERALS) TABS Take 1 tablet by mouth daily.    [provider]  rOPINIRole (REQUIP) 1 MG tablet Take 1 tablet (1 mg total) by mouth 3 (three) times daily. 05/07/19   Colon Branch, MD  senna-docusate (SENOKOT-S) 8.6-50 MG tablet Take 1 tablet by mouth 2 (two) times daily. Patient not taking: Reported on 08/26/2019 01/22/18   Elgergawy, Silver Huguenin, MD  simvastatin (ZOCOR) 40 MG tablet Take 1 tablet (40 mg total) by mouth daily. 05/13/19   Colon [facility], MD  tamsulosin (FLOMAX) 0.4 MG CAPS capsule Take 1 capsule (0.4 mg total) by mouth daily. 09/09/19   Colon [facility], MD  triamterene-hydrochlorothiazide (MAXZIDE-25) 37.5-25 MG tablet Take 0.5 tablets by mouth daily. Patient not taking: Reported on 09/10/2019 05/07/19   Colon Branch, MD    Allergies    Anticoagulant compound and Protonix [pantoprazole sodium]  Review of Systems   Review of Systems  Respiratory: Positive for shortness of breath.   Musculoskeletal:       Right sided chest wall pain  All other systems reviewed and are negative.   Physical Exam Updated Vital Signs BP (!) 151/76   Pulse 66   Temp 98.4 F (36.9 C) (Oral)   Resp 19   Ht 5\' 10"  (1.778 m)   Wt 68 kg   SpO2 93%   BMI 21.51 kg/m   Physical Exam Vitals and nursing note reviewed.  Constitutional:      Appearance: Normal appearance.  HENT:     Head: Normocephalic and atraumatic.     Right Ear: External ear normal.     Left Ear: External ear normal.     Nose: Nose normal.     Mouth/Throat:     Mouth: Mucous membranes are dry.     Pharynx: Oropharynx is  clear.  Eyes:     Extraocular Movements: Extraocular movements intact.     Conjunctiva/sclera: Conjunctivae normal.     Pupils: Pupils are equal, round, and reactive to light.  Cardiovascular:     Rate and Rhythm: Normal rate and regular rhythm.     Pulses: Normal pulses.     Heart sounds: Normal heart sounds.  Pulmonary:     Effort: Pulmonary effort is normal. Tachypnea present.     Breath sounds: Normal breath sounds.  Chest:       Comments: Sub q air able to be palpated Abdominal:     General: Abdomen is flat. Bowel sounds are normal.     Palpations: Abdomen is soft.  Musculoskeletal:        General: Normal range of motion.     Cervical back: Normal range of motion and neck supple.  Skin:    General: Skin is warm.     Capillary Refill: Capillary refill takes less than 2 seconds.  Neurological:     Mental Status: He is alert.  Psychiatric:        Mood and Affect: Mood normal.        Behavior: Behavior normal.     ED Results / Procedures / Treatments   Labs (all labs ordered are listed, but only abnormal results are displayed) Labs Reviewed  BASIC METABOLIC PANEL - Abnormal; Notable for the following components:      Result Value   Chloride 113 (*)    Glucose,  Bld 135 (*)    Creatinine, Ser 1.54 (*)    Calcium 7.8 (*)    GFR calc non Af Amer 39 (*)    GFR calc Af Amer 45 (*)    All other components within normal limits  CBC WITH DIFFERENTIAL/PLATELET - Abnormal; Notable for the following components:   WBC 19.0 (*)    RBC 4.01 (*)    MCV 101.5 (*)    Neutro Abs 16.5 (*)    Monocytes Absolute 1.5 (*)    Abs Immature Granulocytes 0.13 (*)    All other components within normal limits  URINALYSIS, ROUTINE W REFLEX MICROSCOPIC    EKG None  Radiology DG Chest Portable 1 View  Result Date: 09/30/2019 CLINICAL DATA:  Worsening dyspnea. Chest injury after tripping and falling. EXAM: PORTABLE CHEST 1 VIEW COMPARISON:  September 27, 2019. FINDINGS: Interval development  of a large amount of subcutaneous emphysema in the right chest wall, shoulder and neck soft tissues. Thin linear air lucency outlines the right heart border. Worsened right lower lung zone interstitial opacities could be atelectatic, inflammatory or infectious. Thin linear lucency pleural air projects at the lung apex. There is no obvious pleural effusion. The right mediastinal soft tissues project increasingly towards the spinal column concerning for mild mediastinal shift. Stable mild cardiomegaly and central pulmonary vascular congestion and chronic interstitial markings are present in both lungs. A displaced fracture of the right tenth rib posterior aspect is new. There is calcified atherosclerosis of the thoracic aorta. Subtle angular contour of the right sixth rib and discontinuity of the lower costochondral calcification could also represent acute nondisplaced fractures. I discussed critical results by telephone at the time of interpretation on 09/30/2019 at 10:27 pm with provider Amare Bail , who verbally acknowledged these results. IMPRESSION: A new right apical and middle lobe pneumothorax with mild mediastinal shift from right to left and acute right lower rib fractures. Interval development of a large amount of subcutaneous emphysema in the right chest wall, shoulder, and neck soft tissues. Worsened interstitial opacities at the right lung base, may represent atelectasis, inflammatory or infectious. Stable mild cardiomegaly and central pulmonary vascular congestion without obvious pleural effusion. Thoracic calcified atherosclerosis. Electronically Signed   By: Revonda Humphrey   On: 09/30/2019 22:29    Procedures Procedures (including critical care time)  Medications Ordered in ED Medications  fentaNYL (SUBLIMAZE) injection 50 mcg (50 mcg Intravenous Given 09/30/19 2221)  ondansetron (ZOFRAN) injection 4 mg (4 mg Intravenous Given 09/30/19 2221)    ED Course  I have reviewed the triage vital  signs and the nursing notes.  Pertinent labs & imaging results that were available during my care of the patient were reviewed by me and considered in my medical decision making (see chart for details).    MDM Rules/Calculators/A&P                      Results of CXR reviewed with Dr. Roxan Hockey (CTS).  He does not feel like he needs to place the tube as he does not need surgery.  I spoke with pt's daughter.  She wants to hold off on chest tube as he is comfortably breathing now.    CT scans chest/abd/pelvis pending at shift change.  Pt signed out to Dr. Florina Ou at shift change.   Final Clinical Impression(s) / ED Diagnoses Final diagnoses:  Closed fracture of multiple ribs of right side, initial encounter  Traumatic pneumothorax, initial encounter    Rx /  DC Orders ED Discharge Orders    None       Isla Pence, MD 09/30/19 2251

## 2019-09-30 NOTE — ED Notes (Signed)
Pt informed that we need a urine sample and given a urinal. Pt's daughter is at bedside, and states that she will help him with the urinal and call when they have a sample.

## 2019-10-01 ENCOUNTER — Inpatient Hospital Stay (HOSPITAL_COMMUNITY): Payer: Medicare Other

## 2019-10-01 ENCOUNTER — Inpatient Hospital Stay (HOSPITAL_COMMUNITY): Payer: Medicare Other | Admitting: Anesthesiology

## 2019-10-01 ENCOUNTER — Encounter (HOSPITAL_COMMUNITY): Payer: Self-pay | Admitting: Anesthesiology

## 2019-10-01 DIAGNOSIS — E785 Hyperlipidemia, unspecified: Secondary | ICD-10-CM | POA: Diagnosis present

## 2019-10-01 DIAGNOSIS — N183 Chronic kidney disease, stage 3 unspecified: Secondary | ICD-10-CM | POA: Diagnosis present

## 2019-10-01 DIAGNOSIS — Z515 Encounter for palliative care: Secondary | ICD-10-CM | POA: Diagnosis present

## 2019-10-01 DIAGNOSIS — Z7189 Other specified counseling: Secondary | ICD-10-CM | POA: Diagnosis not present

## 2019-10-01 DIAGNOSIS — Z7951 Long term (current) use of inhaled steroids: Secondary | ICD-10-CM | POA: Diagnosis not present

## 2019-10-01 DIAGNOSIS — Z85820 Personal history of malignant melanoma of skin: Secondary | ICD-10-CM | POA: Diagnosis not present

## 2019-10-01 DIAGNOSIS — R627 Adult failure to thrive: Secondary | ICD-10-CM | POA: Diagnosis present

## 2019-10-01 DIAGNOSIS — Z66 Do not resuscitate: Secondary | ICD-10-CM | POA: Diagnosis present

## 2019-10-01 DIAGNOSIS — Z20822 Contact with and (suspected) exposure to covid-19: Secondary | ICD-10-CM | POA: Diagnosis present

## 2019-10-01 DIAGNOSIS — J439 Emphysema, unspecified: Secondary | ICD-10-CM | POA: Diagnosis present

## 2019-10-01 DIAGNOSIS — Z79899 Other long term (current) drug therapy: Secondary | ICD-10-CM | POA: Diagnosis not present

## 2019-10-01 DIAGNOSIS — Z79891 Long term (current) use of opiate analgesic: Secondary | ICD-10-CM | POA: Diagnosis not present

## 2019-10-01 DIAGNOSIS — Z8701 Personal history of pneumonia (recurrent): Secondary | ICD-10-CM | POA: Diagnosis not present

## 2019-10-01 DIAGNOSIS — Z87891 Personal history of nicotine dependence: Secondary | ICD-10-CM | POA: Diagnosis not present

## 2019-10-01 DIAGNOSIS — Z86718 Personal history of other venous thrombosis and embolism: Secondary | ICD-10-CM | POA: Diagnosis not present

## 2019-10-01 DIAGNOSIS — F05 Delirium due to known physiological condition: Secondary | ICD-10-CM | POA: Diagnosis not present

## 2019-10-01 DIAGNOSIS — S2241XA Multiple fractures of ribs, right side, initial encounter for closed fracture: Secondary | ICD-10-CM | POA: Diagnosis present

## 2019-10-01 DIAGNOSIS — S301XXA Contusion of abdominal wall, initial encounter: Secondary | ICD-10-CM | POA: Diagnosis present

## 2019-10-01 DIAGNOSIS — R339 Retention of urine, unspecified: Secondary | ICD-10-CM | POA: Diagnosis present

## 2019-10-01 DIAGNOSIS — J939 Pneumothorax, unspecified: Secondary | ICD-10-CM | POA: Diagnosis present

## 2019-10-01 DIAGNOSIS — Z96652 Presence of left artificial knee joint: Secondary | ICD-10-CM | POA: Diagnosis present

## 2019-10-01 DIAGNOSIS — W010XXA Fall on same level from slipping, tripping and stumbling without subsequent striking against object, initial encounter: Secondary | ICD-10-CM | POA: Diagnosis present

## 2019-10-01 DIAGNOSIS — H9190 Unspecified hearing loss, unspecified ear: Secondary | ICD-10-CM | POA: Diagnosis present

## 2019-10-01 DIAGNOSIS — S271XXA Traumatic hemothorax, initial encounter: Secondary | ICD-10-CM | POA: Diagnosis present

## 2019-10-01 DIAGNOSIS — J942 Hemothorax: Secondary | ICD-10-CM | POA: Diagnosis not present

## 2019-10-01 DIAGNOSIS — G2581 Restless legs syndrome: Secondary | ICD-10-CM | POA: Diagnosis present

## 2019-10-01 DIAGNOSIS — Z8711 Personal history of peptic ulcer disease: Secondary | ICD-10-CM | POA: Diagnosis not present

## 2019-10-01 DIAGNOSIS — S272XXA Traumatic hemopneumothorax, initial encounter: Secondary | ICD-10-CM | POA: Diagnosis present

## 2019-10-01 DIAGNOSIS — W19XXXA Unspecified fall, initial encounter: Secondary | ICD-10-CM | POA: Diagnosis present

## 2019-10-01 LAB — URINALYSIS, ROUTINE W REFLEX MICROSCOPIC
Bilirubin Urine: NEGATIVE
Glucose, UA: NEGATIVE mg/dL
Hgb urine dipstick: NEGATIVE
Ketones, ur: 20 mg/dL — AB
Leukocytes,Ua: NEGATIVE
Nitrite: NEGATIVE
Protein, ur: NEGATIVE mg/dL
Specific Gravity, Urine: 1.045 — ABNORMAL HIGH (ref 1.005–1.030)
pH: 5 (ref 5.0–8.0)

## 2019-10-01 LAB — CBC
HCT: 42.1 % (ref 39.0–52.0)
Hemoglobin: 13 g/dL (ref 13.0–17.0)
MCH: 31.5 pg (ref 26.0–34.0)
MCHC: 30.9 g/dL (ref 30.0–36.0)
MCV: 101.9 fL — ABNORMAL HIGH (ref 80.0–100.0)
Platelets: 239 10*3/uL (ref 150–400)
RBC: 4.13 MIL/uL — ABNORMAL LOW (ref 4.22–5.81)
RDW: 14.4 % (ref 11.5–15.5)
WBC: 18.1 10*3/uL — ABNORMAL HIGH (ref 4.0–10.5)
nRBC: 0 % (ref 0.0–0.2)

## 2019-10-01 LAB — BASIC METABOLIC PANEL
Anion gap: 8 (ref 5–15)
BUN: 19 mg/dL (ref 8–23)
CO2: 22 mmol/L (ref 22–32)
Calcium: 7.9 mg/dL — ABNORMAL LOW (ref 8.9–10.3)
Chloride: 112 mmol/L — ABNORMAL HIGH (ref 98–111)
Creatinine, Ser: 1.47 mg/dL — ABNORMAL HIGH (ref 0.61–1.24)
GFR calc Af Amer: 47 mL/min — ABNORMAL LOW (ref >60)
GFR calc non Af Amer: 41 mL/min — ABNORMAL LOW (ref >60)
Glucose, Bld: 144 mg/dL — ABNORMAL HIGH (ref 70–99)
Potassium: 4.7 mmol/L (ref 3.5–5.1)
Sodium: 142 mmol/L (ref 135–145)

## 2019-10-01 LAB — PROTIME-INR
INR: 1.1 (ref 0.8–1.2)
Prothrombin Time: 14.3 seconds (ref 11.4–15.2)

## 2019-10-01 LAB — APTT: aPTT: 30 seconds (ref 24–36)

## 2019-10-01 LAB — MRSA PCR SCREENING: MRSA by PCR: NEGATIVE

## 2019-10-01 LAB — SARS CORONAVIRUS 2 (TAT 6-24 HRS): SARS Coronavirus 2: NEGATIVE

## 2019-10-01 MED ORDER — DOXAZOSIN MESYLATE 4 MG PO TABS
4.0000 mg | ORAL_TABLET | Freq: Every day | ORAL | Status: DC
  Administered 2019-10-05 – 2019-10-11 (×4): 4 mg via ORAL
  Filled 2019-10-01 (×12): qty 1

## 2019-10-01 MED ORDER — BENZONATATE 100 MG PO CAPS: 200.0000 mg | ORAL_CAPSULE | Freq: Three times a day (TID) | ORAL | Status: DC | PRN

## 2019-10-01 MED ORDER — MECLIZINE HCL 25 MG PO TABS: 25.0000 mg | ORAL_TABLET | Freq: Every day | ORAL | Status: DC | PRN

## 2019-10-01 MED ORDER — LIDOCAINE-EPINEPHRINE (PF) 1.5 %-1:200000 IJ SOLN
INTRAMUSCULAR | Status: DC | PRN
  Administered 2019-10-01: 5 mL via EPIDURAL

## 2019-10-01 MED ORDER — NALOXONE HCL 0.4 MG/ML IJ SOLN: 0.4000 mg | INTRAMUSCULAR | Status: DC | PRN

## 2019-10-01 MED ORDER — HYDROMORPHONE HCL 1 MG/ML IJ SOLN: 0.5000 mg | INTRAMUSCULAR | Status: DC | PRN

## 2019-10-01 MED ORDER — METHOCARBAMOL 500 MG PO TABS
1000.0000 mg | ORAL_TABLET | Freq: Three times a day (TID) | ORAL | Status: DC
  Administered 2019-10-01 – 2019-10-12 (×29): 1000 mg via ORAL
  Filled 2019-10-01 (×32): qty 2

## 2019-10-01 MED ORDER — ROPIVACAINE HCL 2 MG/ML IJ SOLN
8.0000 mL/h | INTRAMUSCULAR | Status: DC
  Filled 2019-10-01: qty 200

## 2019-10-01 MED ORDER — ROPIVACAINE HCL 2 MG/ML IJ SOLN
8.0000 mL/h | INTRAMUSCULAR | Status: DC
  Administered 2019-10-01 – 2019-10-05 (×4): 8 mL/h via EPIDURAL
  Filled 2019-10-01 (×12): qty 200

## 2019-10-01 MED ORDER — LACTATED RINGERS IV BOLUS
500.0000 mL | Freq: Once | INTRAVENOUS | Status: AC
  Administered 2019-10-01: 500 mL via INTRAVENOUS

## 2019-10-01 MED ORDER — ALBUMIN HUMAN 5 % IV SOLN
12.5000 g | Freq: Once | INTRAVENOUS | Status: AC
  Administered 2019-10-01: 12.5 g via INTRAVENOUS
  Filled 2019-10-01: qty 250

## 2019-10-01 MED ORDER — DIAZEPAM 5 MG PO TABS
5.0000 mg | ORAL_TABLET | Freq: Four times a day (QID) | ORAL | Status: DC | PRN
  Administered 2019-10-01: 5 mg via ORAL
  Filled 2019-10-01: qty 1

## 2019-10-01 MED ORDER — LACTATED RINGERS IV SOLN: INTRAVENOUS | Status: DC

## 2019-10-01 MED ORDER — METHOCARBAMOL 500 MG PO TABS: 500.0000 mg | ORAL_TABLET | Freq: Three times a day (TID) | ORAL | Status: DC

## 2019-10-01 MED ORDER — MOMETASONE FURO-FORMOTEROL FUM 200-5 MCG/ACT IN AERO
2.0000 | INHALATION_SPRAY | Freq: Two times a day (BID) | RESPIRATORY_TRACT | Status: DC
  Administered 2019-10-02 – 2019-10-12 (×16): 2 via RESPIRATORY_TRACT
  Filled 2019-10-01: qty 8.8

## 2019-10-01 MED ORDER — TRIAMTERENE-HCTZ 37.5-25 MG PO TABS
0.5000 | ORAL_TABLET | Freq: Every day | ORAL | Status: DC
  Administered 2019-10-02 – 2019-10-12 (×10): 0.5 via ORAL
  Filled 2019-10-01 (×12): qty 0.5

## 2019-10-01 MED ORDER — FENTANYL CITRATE (PF) 100 MCG/2ML IJ SOLN
50.0000 ug | INTRAMUSCULAR | Status: AC | PRN
  Administered 2019-10-01 (×6): 50 ug via INTRAVENOUS
  Filled 2019-10-01 (×6): qty 2

## 2019-10-01 MED ORDER — BUPROPION HCL ER (SR) 100 MG PO TB12
200.0000 mg | ORAL_TABLET | Freq: Two times a day (BID) | ORAL | Status: DC
  Administered 2019-10-01 – 2019-10-12 (×21): 200 mg via ORAL
  Filled 2019-10-01 (×24): qty 2

## 2019-10-01 MED ORDER — SENNOSIDES-DOCUSATE SODIUM 8.6-50 MG PO TABS
1.0000 | ORAL_TABLET | Freq: Two times a day (BID) | ORAL | Status: DC
  Administered 2019-10-01 – 2019-10-11 (×14): 1 via ORAL
  Filled 2019-10-01 (×15): qty 1

## 2019-10-01 MED ORDER — DIPHENHYDRAMINE HCL 12.5 MG/5ML PO ELIX: 12.5000 mg | ORAL_SOLUTION | Freq: Four times a day (QID) | ORAL | Status: DC | PRN

## 2019-10-01 MED ORDER — TAMSULOSIN HCL 0.4 MG PO CAPS
0.4000 mg | ORAL_CAPSULE | Freq: Every day | ORAL | Status: DC
  Administered 2019-10-02 – 2019-10-12 (×11): 0.4 mg via ORAL
  Filled 2019-10-01 (×11): qty 1

## 2019-10-01 MED ORDER — ROPINIROLE HCL 1 MG PO TABS
1.0000 mg | ORAL_TABLET | Freq: Three times a day (TID) | ORAL | Status: DC
  Administered 2019-10-01 – 2019-10-02 (×2): 1 mg via ORAL
  Filled 2019-10-01 (×2): qty 1

## 2019-10-01 MED ORDER — HYDROMORPHONE HCL 1 MG/ML IJ SOLN
0.5000 mg | INTRAMUSCULAR | Status: DC | PRN
  Administered 2019-10-09 – 2019-10-10 (×2): 0.5 mg via INTRAVENOUS
  Filled 2019-10-01 (×2): qty 1

## 2019-10-01 MED ORDER — LIDOCAINE HCL (PF) 1 % IJ SOLN
INTRAMUSCULAR | Status: AC
  Filled 2019-10-01: qty 30

## 2019-10-01 MED ORDER — ACETAMINOPHEN 500 MG PO TABS: 1000.0000 mg | ORAL_TABLET | Freq: Three times a day (TID) | ORAL | Status: DC | PRN

## 2019-10-01 MED ORDER — OXYCODONE HCL 5 MG PO TABS
5.0000 mg | ORAL_TABLET | ORAL | Status: DC | PRN
  Administered 2019-10-02 – 2019-10-09 (×4): 5 mg via ORAL
  Filled 2019-10-01 (×4): qty 1

## 2019-10-01 MED ORDER — HYDROMORPHONE 1 MG/ML IV SOLN: INTRAVENOUS | Status: DC

## 2019-10-01 MED ORDER — ONDANSETRON HCL 4 MG/2ML IJ SOLN
4.0000 mg | Freq: Four times a day (QID) | INTRAMUSCULAR | Status: DC | PRN
  Administered 2019-10-03: 4 mg via INTRAVENOUS
  Filled 2019-10-01 (×2): qty 2

## 2019-10-01 MED ORDER — TRAMADOL HCL 50 MG PO TABS
25.0000 mg | ORAL_TABLET | Freq: Four times a day (QID) | ORAL | Status: DC
  Administered 2019-10-01 – 2019-10-12 (×41): 25 mg via ORAL
  Filled 2019-10-01 (×42): qty 1

## 2019-10-01 MED ORDER — ACETAMINOPHEN 500 MG PO TABS
1000.0000 mg | ORAL_TABLET | Freq: Four times a day (QID) | ORAL | Status: DC
  Administered 2019-10-01 – 2019-10-08 (×24): 1000 mg via ORAL
  Filled 2019-10-01 (×29): qty 2

## 2019-10-01 MED ORDER — LIDOCAINE HCL (PF) 1 % IJ SOLN
INTRAMUSCULAR | Status: AC
  Filled 2019-10-01: qty 5

## 2019-10-01 MED ORDER — ACETAMINOPHEN 500 MG PO TABS
1000.0000 mg | ORAL_TABLET | Freq: Three times a day (TID) | ORAL | Status: DC
  Administered 2019-10-01: 1000 mg via ORAL
  Filled 2019-10-01: qty 2

## 2019-10-01 MED ORDER — DIPHENHYDRAMINE HCL 50 MG/ML IJ SOLN: 12.5000 mg | Freq: Four times a day (QID) | INTRAMUSCULAR | Status: DC | PRN

## 2019-10-01 MED ORDER — MONTELUKAST SODIUM 10 MG PO TABS
10.0000 mg | ORAL_TABLET | Freq: Every day | ORAL | Status: DC
  Administered 2019-10-02 – 2019-10-12 (×11): 10 mg via ORAL
  Filled 2019-10-01 (×11): qty 1

## 2019-10-01 MED ORDER — FAMOTIDINE 20 MG PO TABS
20.0000 mg | ORAL_TABLET | Freq: Every day | ORAL | Status: DC
  Administered 2019-10-02 – 2019-10-12 (×11): 20 mg via ORAL
  Filled 2019-10-01 (×11): qty 1

## 2019-10-01 MED ORDER — ALBUTEROL SULFATE (2.5 MG/3ML) 0.083% IN NEBU
3.0000 mL | INHALATION_SOLUTION | Freq: Four times a day (QID) | RESPIRATORY_TRACT | Status: DC | PRN
  Administered 2019-10-09: 3 mL via RESPIRATORY_TRACT
  Filled 2019-10-01: qty 3

## 2019-10-01 MED ORDER — DEXTROSE-NACL 5-0.9 % IV SOLN: INTRAVENOUS | Status: DC

## 2019-10-01 MED ORDER — ENOXAPARIN SODIUM 30 MG/0.3ML ~~LOC~~ SOLN: 30.0000 mg | SUBCUTANEOUS | Status: DC

## 2019-10-01 MED ORDER — OXYCODONE HCL 5 MG PO TABS: 5.0000 mg | ORAL_TABLET | Freq: Four times a day (QID) | ORAL | Status: DC | PRN

## 2019-10-01 MED ORDER — SODIUM CHLORIDE 0.9% FLUSH: 9.0000 mL | INTRAVENOUS | Status: DC | PRN

## 2019-10-01 NOTE — Anesthesia Procedure Notes (Signed)
Epidural Patient location during procedure: floor Start time: 10/01/2019 6:45 PM End time: 10/01/2019 7:02 PM  Staffing Performed: anesthesiologist   Preanesthetic Checklist Completed: patient identified, IV checked, site marked, risks and benefits discussed, surgical consent, monitors and equipment checked, pre-op evaluation and timeout performed  Epidural Patient position: sitting Prep: DuraPrep Patient monitoring: heart rate, cardiac monitor, continuous pulse ox and blood pressure Approach: midline Location: thoracic (1-12) Injection technique: LOR saline  Needle:  Needle type: Tuohy  Needle gauge: 17 G Needle length: 9 cm Needle insertion depth: 6 cm Catheter type: closed end flexible Catheter size: 19 Gauge Catheter at skin depth: 14 cm Test dose: 1.5% lidocaine with Epi 1:200 K  Additional Notes Reason for block:at surgeon's request

## 2019-10-01 NOTE — ED Provider Notes (Signed)
Nursing notes and vitals signs, including pulse oximetry, reviewed.  Summary of this visit's results, reviewed by myself:  EKG:  EKG Interpretation  Date/Time:    Ventricular Rate:    PR Interval:    QRS Duration:   QT Interval:    QTC Calculation:   R Axis:     Text Interpretation:         Labs:  Results for orders placed or performed during the hospital encounter of 09/30/19 (from the past 24 hour(s))  Basic metabolic panel     Status: Abnormal   Collection Time: 09/30/19 10:23 PM  Result Value Ref Range   Sodium 142 135 - 145 mmol/L   Potassium 4.5 3.5 - 5.1 mmol/L   Chloride 113 (H) 98 - 111 mmol/L   CO2 23 22 - 32 mmol/L   Glucose, Bld 135 (H) 70 - 99 mg/dL   BUN 17 8 - 23 mg/dL   Creatinine, Ser 1.54 (H) 0.61 - 1.24 mg/dL   Calcium 7.8 (L) 8.9 - 10.3 mg/dL   GFR calc non Af Amer 39 (L) >60 mL/min   GFR calc Af Amer 45 (L) >60 mL/min   Anion gap 6 5 - 15  CBC with Differential     Status: Abnormal   Collection Time: 09/30/19 10:23 PM  Result Value Ref Range   WBC 19.0 (H) 4.0 - 10.5 K/uL   RBC 4.01 (L) 4.22 - 5.81 MIL/uL   Hemoglobin 13.0 13.0 - 17.0 g/dL   HCT 40.7 39.0 - 52.0 %   MCV 101.5 (H) 80.0 - 100.0 fL   MCH 32.4 26.0 - 34.0 pg   MCHC 31.9 30.0 - 36.0 g/dL   RDW 14.1 11.5 - 15.5 %   Platelets 246 150 - 400 K/uL   nRBC 0.0 0.0 - 0.2 %   Neutrophils Relative % 86 %   Neutro Abs 16.5 (H) 1.7 - 7.7 K/uL   Lymphocytes Relative 4 %   Lymphs Abs 0.8 0.7 - 4.0 K/uL   Monocytes Relative 8 %   Monocytes Absolute 1.5 (H) 0.1 - 1.0 K/uL   Eosinophils Relative 1 %   Eosinophils Absolute 0.1 0.0 - 0.5 K/uL   Basophils Relative 0 %   Basophils Absolute 0.0 0.0 - 0.1 K/uL   Immature Granulocytes 1 %   Abs Immature Granulocytes 0.13 (H) 0.00 - 0.07 K/uL    Imaging Studies: CT Chest W Contrast  Result Date: 10/01/2019 CLINICAL DATA:  [place] while trying to stand, abnormal chest radiograph EXAM: CT CHEST WITH CONTRAST CT ABDOMEN AND PELVIS WITH AND WITHOUT  CONTRAST TECHNIQUE: Multidetector CT imaging of the chest was performed during intravenous contrast administration. Multidetector CT imaging of the abdomen and pelvis was performed following the standard protocol before and during bolus administration of intravenous contrast. CONTRAST:  49mL OMNIPAQUE IOHEXOL 300 MG/ML  SOLN COMPARISON:  CT abdomen pelvis 06/23/2017, same day chest radiograph, CT chest December 19, 2010 FINDINGS: CT CHEST FINDINGS Cardiovascular: Cardiomegaly with predominantly right heart enlargement. Small volume of fluid in the pericardium and pericardial recesses. Extensive three-vessel coronary artery disease. Calcifications noted upon the aortic leaflets. The aortic root is suboptimally assessed given cardiac pulsation artifact. Extensive calcification throughout the thoracic aorta. No intramural hematoma, dissection flap or other acute luminal abnormality of the aorta is seen. No periaortic stranding or hemorrhage. Central pulmonary arteries are normal caliber. Slight tortuosity of the right pulmonary artery likely related to volume changes in the right chest. No large filling defects on this  non tailored examination. Mediastinum/Nodes: No acute traumatic abnormality of the trachea. Coronal narrowing compatible with COPD. Moderate hiatal hernia. No acute traumatic abnormality of the esophagus. Few subcentimeter nodules in the thyroid gland, these do not warrant further evaluation in a patient of this age. Thoracic inlet otherwise unremarkable. No mediastinal, hilar discernible axillary adenopathy. Lungs/Pleura: Large right hemo pneumothorax with a likely areas sub pleural pulmonary laceration in the right middle lobe (3/121) adjacent minimally displaced fractures of the right anterolateral third through sixth rib. More extensive areas of consolidated lung and volume loss adjacent pleural fluid and layering hemorrhage with likely underlying pulmonary laceration seen in the posterior right lower  lobe adjacent the highly comminuted fractures of the a tenth and eleventh ribs. Bandlike areas of opacity in the left lung favor atelectasis and perhaps mild edema with interlobular septal thickening and mild vascular cuffing of findings on a background of severe centrilobular and paraseptal emphysema with chronic appearing bronchitic changes. Musculoskeletal: There are minimally displaced fractures of the right anterolateral third fourth and fifth rib as well as highly comminuted posterior rib fractures of the right tenth and eleventh ribs. Severe subcutaneous emphysema is noted across the right chest wall and extending into the right shoulder girdle in in the soft tissues of the base of the right neck and in the subclavicular space as well. Remote posterior left seventh, eighth, tenth and eleventh rib fractures seen posteriorly as well as rim of lateral sixth and seventh rib fractures. No acute osseous injury of the left chest wall or shoulder. Severe multilevel degenerative changes are present in the spine thoracic spine. There is been progressive vertebral body height loss at the level of a split type deformity AO spine B2) at T12. Multilevel flowing anterior osteophytosis, compatible with features of diffuse idiopathic skeletal hyperostosis (DISH). Mild bilateral gynecomastia. CT ABDOMEN AND PELVIS FINDINGS Hepatobiliary: No direct hepatic injury or perihepatic hematoma. No suspicious liver lesion. Calcified gallstone layering dependently towards the gallbladder neck. No biliary ductal dilatation or pericholecystic inflammation is seen. Pancreas: Mild pancreatic atrophy. No peripancreatic inflammation or ductal dilatation. No pancreatic contusive changes. Spleen: No direct splenic injury or perisplenic hematoma. Adrenals/Urinary Tract: Normal adrenal glands, no adrenal hemorrhage. Fairly symmetric bilateral renal atrophy. There is a large right parapelvic cyst which is similar to comparison. Additional  exophytic cortical cysts in both kidneys as well as smaller subcentimeter hypertension foci too small to fully characterize on CT imaging but statistically likely benign. Moderate bilateral symmetric perinephric stranding, is a stable though nonspecific finding though may correlate with either age or decreased renal function. No evidence of direct renal injury or perirenal hemorrhage. No extravasation of contrast on excretory delayed images. No evidence of bladder injury. Stomach/Bowel: Moderate hiatal hernia as above. Stomach is unremarkable. Limbs of the patient's inferior vena cava filter closely approximate the posterior wall of the second portion duodenum (6/26), of indeterminate significance. No small bowel dilatation or wall thickening. Some fecalized distal small bowel contents may reflect slowed intestinal transit.Appendix is not well visualized. No focal pericecal inflammation is seen to suggest occult appendicitis. Pancolonic diverticulosis without evidence of acute diverticular inflammation. No colonic dilatation or wall thickening. No evidence of mesenteric contusion or hematoma. Vascular/Lymphatic: Extensive severe atherosclerotic calcification of the abdominal aorta and branch vessels without acute luminal abnormality at this time. No proximal occlusions are clearly evident. No direct vascular injury. No sites of active contrast extravasation Reproductive: Enlarged prostate with indentation of the bladder base. Other: No abdominopelvic free fluid or air. No bowel  containing hernias. Extensive right flank and abdominal wall subcutaneous emphysema likely tracking from the chest injuries detailed above. Mild posterior body wall edema. Musculoskeletal: The osseous structures appear diffusely demineralized which may limit detection of small or nondisplaced fractures. Levocurvature of the lumbar spine with an apex at L2. Moderate degenerative changes throughout the lumbar spine and hips. Postsurgical  changes prior lower lumbar posterior decompression. No acute or suspicious osseous lesions in the abdomen or pelvis. IMPRESSION: Traumatic Findings: 1. Large right hemopneumothorax with likely areas of sub pleural pulmonary laceration most pronounced in the posterolateral right lower lobe and the right middle lobe. Findings are secondary to highly comminuted fractures of the right tenth and eleventh ribs and more minimally displaced fractures of the right lateral third through sixth ribs. 2. Extensive subcutaneous emphysema across the right chest wall and extending into the right shoulder girdle and subclavicular space as well as extending inferiorly across the abdomen and right flank. 3. Split compression deformity of T12 with progressive height loss comparison but overall subacute to chronic appearance though could correlate for point tenderness. Numerous remote left-sided rib fractures is seen as well. 4. No CT evidence of acute traumatic injury to the solid abdominal organs or bowel. Nontraumatic Findings: 1. Severe centrilobular and paraseptal emphysema with chronic appearing bronchitic changes. ( Emphysema (ICD10-J43.9).) 2. Septal thickening and vascular cuffing may reflect some mild superimposed edema. 3. Cardiomegaly with predominantly right heart enlargement and extensive coronary artery atherosclerosis. 4. Small volume of fluid in the pericardium and pericardial recesses, low-attenuation favoring a nontraumatic finding related to fluid status. 5. Limbs of the patient's inferior vena cava filter closely approximate the posterior wall of the second portion duodenum of indeterminate significance. 6. Pancolonic diverticulosis without evidence of acute diverticular inflammation. 7. Enlarged prostate with indentation of the bladder base. 8. Fluid-filled moderate hiatal hernia, correlate for aspiration risk. 9.  Aortic Atherosclerosis (ICD10-I70.0). These results were called by telephone at the time of  interpretation on 10/01/2019 at 12:09 am to provider Dr. Florina Ou, who verbally acknowledged these results. Electronically Signed   By: Lovena Le M.D.   On: 10/01/2019 00:10   CT ABDOMEN PELVIS W CONTRAST  Result Date: 10/01/2019 CLINICAL DATA:  [place] while trying to stand, abnormal chest radiograph EXAM: CT CHEST WITH CONTRAST CT ABDOMEN AND PELVIS WITH AND WITHOUT CONTRAST TECHNIQUE: Multidetector CT imaging of the chest was performed during intravenous contrast administration. Multidetector CT imaging of the abdomen and pelvis was performed following the standard protocol before and during bolus administration of intravenous contrast. CONTRAST:  49mL OMNIPAQUE IOHEXOL 300 MG/ML  SOLN COMPARISON:  CT abdomen pelvis 06/23/2017, same day chest radiograph, CT chest December 19, 2010 FINDINGS: CT CHEST FINDINGS Cardiovascular: Cardiomegaly with predominantly right heart enlargement. Small volume of fluid in the pericardium and pericardial recesses. Extensive three-vessel coronary artery disease. Calcifications noted upon the aortic leaflets. The aortic root is suboptimally assessed given cardiac pulsation artifact. Extensive calcification throughout the thoracic aorta. No intramural hematoma, dissection flap or other acute luminal abnormality of the aorta is seen. No periaortic stranding or hemorrhage. Central pulmonary arteries are normal caliber. Slight tortuosity of the right pulmonary artery likely related to volume changes in the right chest. No large filling defects on this non tailored examination. Mediastinum/Nodes: No acute traumatic abnormality of the trachea. Coronal narrowing compatible with COPD. Moderate hiatal hernia. No acute traumatic abnormality of the esophagus. Few subcentimeter nodules in the thyroid gland, these do not warrant further evaluation in a patient of this age. Thoracic inlet  otherwise unremarkable. No mediastinal, hilar discernible axillary adenopathy. Lungs/Pleura: Large right hemo  pneumothorax with a likely areas sub pleural pulmonary laceration in the right middle lobe (3/121) adjacent minimally displaced fractures of the right anterolateral third through sixth rib. More extensive areas of consolidated lung and volume loss adjacent pleural fluid and layering hemorrhage with likely underlying pulmonary laceration seen in the posterior right lower lobe adjacent the highly comminuted fractures of the a tenth and eleventh ribs. Bandlike areas of opacity in the left lung favor atelectasis and perhaps mild edema with interlobular septal thickening and mild vascular cuffing of findings on a background of severe centrilobular and paraseptal emphysema with chronic appearing bronchitic changes. Musculoskeletal: There are minimally displaced fractures of the right anterolateral third fourth and fifth rib as well as highly comminuted posterior rib fractures of the right tenth and eleventh ribs. Severe subcutaneous emphysema is noted across the right chest wall and extending into the right shoulder girdle in in the soft tissues of the base of the right neck and in the subclavicular space as well. Remote posterior left seventh, eighth, tenth and eleventh rib fractures seen posteriorly as well as rim of lateral sixth and seventh rib fractures. No acute osseous injury of the left chest wall or shoulder. Severe multilevel degenerative changes are present in the spine thoracic spine. There is been progressive vertebral body height loss at the level of a split type deformity AO spine B2) at T12. Multilevel flowing anterior osteophytosis, compatible with features of diffuse idiopathic skeletal hyperostosis (DISH). Mild bilateral gynecomastia. CT ABDOMEN AND PELVIS FINDINGS Hepatobiliary: No direct hepatic injury or perihepatic hematoma. No suspicious liver lesion. Calcified gallstone layering dependently towards the gallbladder neck. No biliary ductal dilatation or pericholecystic inflammation is seen.  Pancreas: Mild pancreatic atrophy. No peripancreatic inflammation or ductal dilatation. No pancreatic contusive changes. Spleen: No direct splenic injury or perisplenic hematoma. Adrenals/Urinary Tract: Normal adrenal glands, no adrenal hemorrhage. Fairly symmetric bilateral renal atrophy. There is a large right parapelvic cyst which is similar to comparison. Additional exophytic cortical cysts in both kidneys as well as smaller subcentimeter hypertension foci too small to fully characterize on CT imaging but statistically likely benign. Moderate bilateral symmetric perinephric stranding, is a stable though nonspecific finding though may correlate with either age or decreased renal function. No evidence of direct renal injury or perirenal hemorrhage. No extravasation of contrast on excretory delayed images. No evidence of bladder injury. Stomach/Bowel: Moderate hiatal hernia as above. Stomach is unremarkable. Limbs of the patient's inferior vena cava filter closely approximate the posterior wall of the second portion duodenum (6/26), of indeterminate significance. No small bowel dilatation or wall thickening. Some fecalized distal small bowel contents may reflect slowed intestinal transit.Appendix is not well visualized. No focal pericecal inflammation is seen to suggest occult appendicitis. Pancolonic diverticulosis without evidence of acute diverticular inflammation. No colonic dilatation or wall thickening. No evidence of mesenteric contusion or hematoma. Vascular/Lymphatic: Extensive severe atherosclerotic calcification of the abdominal aorta and branch vessels without acute luminal abnormality at this time. No proximal occlusions are clearly evident. No direct vascular injury. No sites of active contrast extravasation Reproductive: Enlarged prostate with indentation of the bladder base. Other: No abdominopelvic free fluid or air. No bowel containing hernias. Extensive right flank and abdominal wall subcutaneous  emphysema likely tracking from the chest injuries detailed above. Mild posterior body wall edema. Musculoskeletal: The osseous structures appear diffusely demineralized which may limit detection of small or nondisplaced fractures. Levocurvature of the lumbar spine with an  apex at L2. Moderate degenerative changes throughout the lumbar spine and hips. Postsurgical changes prior lower lumbar posterior decompression. No acute or suspicious osseous lesions in the abdomen or pelvis. IMPRESSION: Traumatic Findings: 1. Large right hemopneumothorax with likely areas of sub pleural pulmonary laceration most pronounced in the posterolateral right lower lobe and the right middle lobe. Findings are secondary to highly comminuted fractures of the right tenth and eleventh ribs and more minimally displaced fractures of the right lateral third through sixth ribs. 2. Extensive subcutaneous emphysema across the right chest wall and extending into the right shoulder girdle and subclavicular space as well as extending inferiorly across the abdomen and right flank. 3. Split compression deformity of T12 with progressive height loss comparison but overall subacute to chronic appearance though could correlate for point tenderness. Numerous remote left-sided rib fractures is seen as well. 4. No CT evidence of acute traumatic injury to the solid abdominal organs or bowel. Nontraumatic Findings: 1. Severe centrilobular and paraseptal emphysema with chronic appearing bronchitic changes. ( Emphysema (ICD10-J43.9).) 2. Septal thickening and vascular cuffing may reflect some mild superimposed edema. 3. Cardiomegaly with predominantly right heart enlargement and extensive coronary artery atherosclerosis. 4. Small volume of fluid in the pericardium and pericardial recesses, low-attenuation favoring a nontraumatic finding related to fluid status. 5. Limbs of the patient's inferior vena cava filter closely approximate the posterior wall of the second  portion duodenum of indeterminate significance. 6. Pancolonic diverticulosis without evidence of acute diverticular inflammation. 7. Enlarged prostate with indentation of the bladder base. 8. Fluid-filled moderate hiatal hernia, correlate for aspiration risk. 9.  Aortic Atherosclerosis (ICD10-I70.0). These results were called by telephone at the time of interpretation on 10/01/2019 at 12:09 am to provider Dr. Florina Ou, who verbally acknowledged these results. Electronically Signed   By: Lovena Le M.D.   On: 10/01/2019 00:10   DG Chest Portable 1 View  Result Date: 09/30/2019 CLINICAL DATA:  Worsening dyspnea. Chest injury after tripping and falling. EXAM: PORTABLE CHEST 1 VIEW COMPARISON:  September 27, 2019. FINDINGS: Interval development of a large amount of subcutaneous emphysema in the right chest wall, shoulder and neck soft tissues. Thin linear air lucency outlines the right heart border. Worsened right lower lung zone interstitial opacities could be atelectatic, inflammatory or infectious. Thin linear lucency pleural air projects at the lung apex. There is no obvious pleural effusion. The right mediastinal soft tissues project increasingly towards the spinal column concerning for mild mediastinal shift. Stable mild cardiomegaly and central pulmonary vascular congestion and chronic interstitial markings are present in both lungs. A displaced fracture of the right tenth rib posterior aspect is new. There is calcified atherosclerosis of the thoracic aorta. Subtle angular contour of the right sixth rib and discontinuity of the lower costochondral calcification could also represent acute nondisplaced fractures. I discussed critical results by telephone at the time of interpretation on 09/30/2019 at 10:27 pm with provider JULIE HAVILAND , who verbally acknowledged these results. IMPRESSION: A new right apical and middle lobe pneumothorax with mild mediastinal shift from right to left and acute right lower rib  fractures. Interval development of a large amount of subcutaneous emphysema in the right chest wall, shoulder, and neck soft tissues. Worsened interstitial opacities at the right lung base, may represent atelectasis, inflammatory or infectious. Stable mild cardiomegaly and central pulmonary vascular congestion without obvious pleural effusion. Thoracic calcified atherosclerosis. Electronically Signed   By: Revonda Humphrey   On: 09/30/2019 22:29   12:17 AM Dr. Rubie Maid as of general  surgery to admit to the trauma service.   Numair Masden, Jenny Reichmann, MD 10/01/19 [phone]

## 2019-10-01 NOTE — Progress Notes (Signed)
PT Cancellation Note  Patient Details Name: Micheal Frank MRN: [id number] DOB: Nov 26, 1925   Cancelled Treatment:    Reason Eval/Treat Not Completed: Medical issues which prohibited therapy. Patient transferred to [facility], [name] 10/01/2019, 12:07 PM [employer] Pager [phone] Office [phone]

## 2019-10-01 NOTE — H&P (Addendum)
History   Micheal Frank is an 86 y.o. male.   Chief Complaint:  Chief Complaint  Patient presents with  . Fall  . Flank Pain    Patient is a 86 year old male who arrives with a history of chronic kidney disease, COPD, history of brain bleed, recent pneumonia, who fell at home.  Patient is accompanied by his daughter who he lives with.  She states that he was likely trying to close a curtain, fell in between some chairs and fell along the right side of his torso onto some chairs.  Patient denies any LOC.  Patient's main complaint is of right thoracic wall chest pain.  Patient underwent CT scan upon arrival to the ED.  CT scan was significant for right hemopneumothorax, right ribs 3-6, 10-11 fractures.  Patient was placed on oxygen.  I did review the CT scan personally.  Surgery was consulted for evaluation and management.    Past Medical History:  Diagnosis Date  . Arthritis    left leg also has swelling  . Asthma   . Blood dyscrasia    pt CAN NOT have any blood thinners d/t hx brain bleed  . Chronic kidney disease, stage III (moderate) 01/20/2013  . Complication of anesthesia    confusion with anesthesia  . Constipation    miralax prn  . COPD (chronic obstructive pulmonary disease) (HCC)    emphysema  . Depression    takes wellbutrin bid  . Epidural abscess   . Gastric ulcer   . History of blood clots   . Hyperlipidemia   . Melanoma ([place])   . Peripheral neuropathy   . Pneumonia    as a child and in 1952;had a pneumonia vaccine couple of years ago  . Prosthetic joint infection ([place])   . S/P right heart catheterization    at least 43yrs  . Shingles   . Short-term memory loss   . Shortness of breath    with exertion  . Sleep apnea    has CPAP but doesn't use it;sleep study done at least 38yrs ago  . Staphylococcus aureus bacteremia with sepsis ([place])   . Subdural hematoma ([place]) 03/30/11  . Urinary frequency    wears depends    Past Surgical History:  Procedure  Laterality Date  . epidural abscess drainage  12/2010  . 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 EYE SURGERY     bil cataract surgery  . FILTERING PROCEDURE     filter placed in neck d/t blood  clot  . HERNIA REPAIR     25+yrs ago  . KNEE ARTHROSCOPY    . SKIN BIOPSY     melanoma on head   . TOTAL KNEE ARTHROPLASTY     x 2 left knee 2003/2012  . TOTAL KNEE REVISION  05/23/2011   Procedure: TOTAL KNEE REVISION;  Surgeon: Meredith Pel;  Location: [facility];  Service: Orthopedics;  Laterality: Left;  LEFT KNEE REMOVAL OF SPACER, POSSIBLE REIMPLANTATION OF REVISION TKA VERSES REPLACEMENT ANTIBIOTIC SPACER    Family History  Problem Relation Age of Onset  . Melanoma Brother   . CAD Neg Hx   . Diabetes Neg Hx    Social History:  reports that he quit smoking about 41 years ago. His smoking use included cigarettes. He has a 80.00 pack-year smoking history. He has never used smokeless tobacco. He reports that he does not drink alcohol or use drugs.  Allergies   Allergies  Allergen Reactions  . Anticoagulant Compound     History of bleeding on the brain per daughter  . Protonix [Pantoprazole Sodium] Diarrhea    Home Medications  (Not in a hospital admission)   Trauma Course   Results for orders placed or performed during the hospital encounter of 09/30/19 (from the past 48 hour(s))  Basic metabolic panel     Status: Abnormal   Collection Time: 09/30/19 10:23 PM  Result Value Ref Range   Sodium 142 135 - 145 mmol/L   Potassium 4.5 3.5 - 5.1 mmol/L   Chloride 113 (H) 98 - 111 mmol/L   CO2 23 22 - 32 mmol/L   Glucose, Bld 135 (H) 70 - 99 mg/dL    Comment: Glucose reference range applies only to samples taken after fasting for at least 8 hours.   BUN 17 8 - 23 mg/dL   Creatinine, Ser 1.54 (H) 0.61 - 1.24 mg/dL   Calcium 7.8 (L) 8.9 - 10.3 mg/dL   GFR calc non Af Amer 39 (L) >60 mL/min   GFR calc Af Amer 45 (L) >60 mL/min   Anion gap 6 5 - 15     Comment: Performed at [facility], [place] [address], [place], [place] [address]  CBC with Differential     Status: Abnormal   Collection Time: 09/30/19 10:23 PM  Result Value Ref Range   WBC 19.0 (H) 4.0 - 10.5 K/uL   RBC 4.01 (L) 4.22 - 5.81 MIL/uL   Hemoglobin 13.0 13.0 - 17.0 g/dL   HCT 40.7 39.0 - 52.0 %   MCV 101.5 (H) 80.0 - 100.0 fL   MCH 32.4 26.0 - 34.0 pg   MCHC 31.9 30.0 - 36.0 g/dL   RDW 14.1 11.5 - 15.5 %   Platelets 246 150 - 400 K/uL   nRBC 0.0 0.0 - 0.2 %   Neutrophils Relative % 86 %   Neutro Abs 16.5 (H) 1.7 - 7.7 K/uL   Lymphocytes Relative 4 %   Lymphs Abs 0.8 0.7 - 4.0 K/uL   Monocytes Relative 8 %   Monocytes Absolute 1.5 (H) 0.1 - 1.0 K/uL   Eosinophils Relative 1 %   Eosinophils Absolute 0.1 0.0 - 0.5 K/uL   Basophils Relative 0 %   Basophils Absolute 0.0 0.0 - 0.1 K/uL   Immature Granulocytes 1 %   Abs Immature Granulocytes 0.13 (H) 0.00 - 0.07 K/uL    Comment: Performed at [facility], [place] [address], [place], [place] [address]   CT Chest W Contrast  Result Date: 10/01/2019 CLINICAL DATA:  [place] while trying to stand, abnormal chest radiograph EXAM: CT CHEST WITH CONTRAST CT ABDOMEN AND PELVIS WITH AND WITHOUT CONTRAST TECHNIQUE: Multidetector CT imaging of the chest was performed during intravenous contrast administration. Multidetector CT imaging of the abdomen and pelvis was performed following the standard protocol before and during bolus administration of intravenous contrast. CONTRAST:  40mL OMNIPAQUE IOHEXOL 300 MG/ML  SOLN COMPARISON:  CT abdomen pelvis 06/23/2017, same day chest radiograph, CT chest December 19, 2010 FINDINGS: CT CHEST FINDINGS Cardiovascular: Cardiomegaly with predominantly right heart enlargement. Small volume of fluid in the pericardium and pericardial recesses. Extensive three-vessel coronary artery disease. Calcifications noted upon the aortic leaflets. The aortic root is suboptimally assessed given  cardiac pulsation artifact. Extensive calcification throughout the thoracic aorta. No intramural hematoma, dissection flap or other acute luminal abnormality of the aorta is seen. No periaortic stranding or hemorrhage.  Central pulmonary arteries are normal caliber. Slight tortuosity of the right pulmonary artery likely related to volume changes in the right chest. No large filling defects on this non tailored examination. Mediastinum/Nodes: No acute traumatic abnormality of the trachea. Coronal narrowing compatible with COPD. Moderate hiatal hernia. No acute traumatic abnormality of the esophagus. Few subcentimeter nodules in the thyroid gland, these do not warrant further evaluation in a patient of this age. Thoracic inlet otherwise unremarkable. No mediastinal, hilar discernible axillary adenopathy. Lungs/Pleura: Large right hemo pneumothorax with a likely areas sub pleural pulmonary laceration in the right middle lobe (3/121) adjacent minimally displaced fractures of the right anterolateral third through sixth rib. More extensive areas of consolidated lung and volume loss adjacent pleural fluid and layering hemorrhage with likely underlying pulmonary laceration seen in the posterior right lower lobe adjacent the highly comminuted fractures of the a tenth and eleventh ribs. Bandlike areas of opacity in the left lung favor atelectasis and perhaps mild edema with interlobular septal thickening and mild vascular cuffing of findings on a background of severe centrilobular and paraseptal emphysema with chronic appearing bronchitic changes. Musculoskeletal: There are minimally displaced fractures of the right anterolateral third fourth and fifth rib as well as highly comminuted posterior rib fractures of the right tenth and eleventh ribs. Severe subcutaneous emphysema is noted across the right chest wall and extending into the right shoulder girdle in in the soft tissues of the base of the right neck and in the  subclavicular space as well. Remote posterior left seventh, eighth, tenth and eleventh rib fractures seen posteriorly as well as rim of lateral sixth and seventh rib fractures. No acute osseous injury of the left chest wall or shoulder. Severe multilevel degenerative changes are present in the spine thoracic spine. There is been progressive vertebral body height loss at the level of a split type deformity AO spine B2) at T12. Multilevel flowing anterior osteophytosis, compatible with features of diffuse idiopathic skeletal hyperostosis (DISH). Mild bilateral gynecomastia. CT ABDOMEN AND PELVIS FINDINGS Hepatobiliary: No direct hepatic injury or perihepatic hematoma. No suspicious liver lesion. Calcified gallstone layering dependently towards the gallbladder neck. No biliary ductal dilatation or pericholecystic inflammation is seen. Pancreas: Mild pancreatic atrophy. No peripancreatic inflammation or ductal dilatation. No pancreatic contusive changes. Spleen: No direct splenic injury or perisplenic hematoma. Adrenals/Urinary Tract: Normal adrenal glands, no adrenal hemorrhage. Fairly symmetric bilateral renal atrophy. There is a large right parapelvic cyst which is similar to comparison. Additional exophytic cortical cysts in both kidneys as well as smaller subcentimeter hypertension foci too small to fully characterize on CT imaging but statistically likely benign. Moderate bilateral symmetric perinephric stranding, is a stable though nonspecific finding though may correlate with either age or decreased renal function. No evidence of direct renal injury or perirenal hemorrhage. No extravasation of contrast on excretory delayed images. No evidence of bladder injury. Stomach/Bowel: Moderate hiatal hernia as above. Stomach is unremarkable. Limbs of the patient's inferior vena cava filter closely approximate the posterior wall of the second portion duodenum (6/26), of indeterminate significance. No small bowel  dilatation or wall thickening. Some fecalized distal small bowel contents may reflect slowed intestinal transit.Appendix is not well visualized. No focal pericecal inflammation is seen to suggest occult appendicitis. Pancolonic diverticulosis without evidence of acute diverticular inflammation. No colonic dilatation or wall thickening. No evidence of mesenteric contusion or hematoma. Vascular/Lymphatic: Extensive severe atherosclerotic calcification of the abdominal aorta and branch vessels without acute luminal abnormality at this time. No proximal occlusions are clearly evident.  No direct vascular injury. No sites of active contrast extravasation Reproductive: Enlarged prostate with indentation of the bladder base. Other: No abdominopelvic free fluid or air. No bowel containing hernias. Extensive right flank and abdominal wall subcutaneous emphysema likely tracking from the chest injuries detailed above. Mild posterior body wall edema. Musculoskeletal: The osseous structures appear diffusely demineralized which may limit detection of small or nondisplaced fractures. Levocurvature of the lumbar spine with an apex at L2. Moderate degenerative changes throughout the lumbar spine and hips. Postsurgical changes prior lower lumbar posterior decompression. No acute or suspicious osseous lesions in the abdomen or pelvis. IMPRESSION: Traumatic Findings: 1. Large right hemopneumothorax with likely areas of sub pleural pulmonary laceration most pronounced in the posterolateral right lower lobe and the right middle lobe. Findings are secondary to highly comminuted fractures of the right tenth and eleventh ribs and more minimally displaced fractures of the right lateral third through sixth ribs. 2. Extensive subcutaneous emphysema across the right chest wall and extending into the right shoulder girdle and subclavicular space as well as extending inferiorly across the abdomen and right flank. 3. Split compression deformity of  T12 with progressive height loss comparison but overall subacute to chronic appearance though could correlate for point tenderness. Numerous remote left-sided rib fractures is seen as well. 4. No CT evidence of acute traumatic injury to the solid abdominal organs or bowel. Nontraumatic Findings: 1. Severe centrilobular and paraseptal emphysema with chronic appearing bronchitic changes. ( Emphysema (ICD10-J43.9).) 2. Septal thickening and vascular cuffing may reflect some mild superimposed edema. 3. Cardiomegaly with predominantly right heart enlargement and extensive coronary artery atherosclerosis. 4. Small volume of fluid in the pericardium and pericardial recesses, low-attenuation favoring a nontraumatic finding related to fluid status. 5. Limbs of the patient's inferior vena cava filter closely approximate the posterior wall of the second portion duodenum of indeterminate significance. 6. Pancolonic diverticulosis without evidence of acute diverticular inflammation. 7. Enlarged prostate with indentation of the bladder base. 8. Fluid-filled moderate hiatal hernia, correlate for aspiration risk. 9.  Aortic Atherosclerosis (ICD10-I70.0). These results were called by telephone at the time of interpretation on 10/01/2019 at 12:09 am to provider Dr. Florina Ou, who verbally acknowledged these results. Electronically Signed   By: Lovena Le M.D.   On: 10/01/2019 00:10   CT ABDOMEN PELVIS W CONTRAST  Result Date: 10/01/2019 CLINICAL DATA:  [place] while trying to stand, abnormal chest radiograph EXAM: CT CHEST WITH CONTRAST CT ABDOMEN AND PELVIS WITH AND WITHOUT CONTRAST TECHNIQUE: Multidetector CT imaging of the chest was performed during intravenous contrast administration. Multidetector CT imaging of the abdomen and pelvis was performed following the standard protocol before and during bolus administration of intravenous contrast. CONTRAST:  57mL OMNIPAQUE IOHEXOL 300 MG/ML  SOLN COMPARISON:  CT abdomen pelvis  06/23/2017, same day chest radiograph, CT chest December 19, 2010 FINDINGS: CT CHEST FINDINGS Cardiovascular: Cardiomegaly with predominantly right heart enlargement. Small volume of fluid in the pericardium and pericardial recesses. Extensive three-vessel coronary artery disease. Calcifications noted upon the aortic leaflets. The aortic root is suboptimally assessed given cardiac pulsation artifact. Extensive calcification throughout the thoracic aorta. No intramural hematoma, dissection flap or other acute luminal abnormality of the aorta is seen. No periaortic stranding or hemorrhage. Central pulmonary arteries are normal caliber. Slight tortuosity of the right pulmonary artery likely related to volume changes in the right chest. No large filling defects on this non tailored examination. Mediastinum/Nodes: No acute traumatic abnormality of the trachea. Coronal narrowing compatible with COPD. Moderate hiatal hernia.  No acute traumatic abnormality of the esophagus. Few subcentimeter nodules in the thyroid gland, these do not warrant further evaluation in a patient of this age. Thoracic inlet otherwise unremarkable. No mediastinal, hilar discernible axillary adenopathy. Lungs/Pleura: Large right hemo pneumothorax with a likely areas sub pleural pulmonary laceration in the right middle lobe (3/121) adjacent minimally displaced fractures of the right anterolateral third through sixth rib. More extensive areas of consolidated lung and volume loss adjacent pleural fluid and layering hemorrhage with likely underlying pulmonary laceration seen in the posterior right lower lobe adjacent the highly comminuted fractures of the a tenth and eleventh ribs. Bandlike areas of opacity in the left lung favor atelectasis and perhaps mild edema with interlobular septal thickening and mild vascular cuffing of findings on a background of severe centrilobular and paraseptal emphysema with chronic appearing bronchitic changes.  Musculoskeletal: There are minimally displaced fractures of the right anterolateral third fourth and fifth rib as well as highly comminuted posterior rib fractures of the right tenth and eleventh ribs. Severe subcutaneous emphysema is noted across the right chest wall and extending into the right shoulder girdle in in the soft tissues of the base of the right neck and in the subclavicular space as well. Remote posterior left seventh, eighth, tenth and eleventh rib fractures seen posteriorly as well as rim of lateral sixth and seventh rib fractures. No acute osseous injury of the left chest wall or shoulder. Severe multilevel degenerative changes are present in the spine thoracic spine. There is been progressive vertebral body height loss at the level of a split type deformity AO spine B2) at T12. Multilevel flowing anterior osteophytosis, compatible with features of diffuse idiopathic skeletal hyperostosis (DISH). Mild bilateral gynecomastia. CT ABDOMEN AND PELVIS FINDINGS Hepatobiliary: No direct hepatic injury or perihepatic hematoma. No suspicious liver lesion. Calcified gallstone layering dependently towards the gallbladder neck. No biliary ductal dilatation or pericholecystic inflammation is seen. Pancreas: Mild pancreatic atrophy. No peripancreatic inflammation or ductal dilatation. No pancreatic contusive changes. Spleen: No direct splenic injury or perisplenic hematoma. Adrenals/Urinary Tract: Normal adrenal glands, no adrenal hemorrhage. Fairly symmetric bilateral renal atrophy. There is a large right parapelvic cyst which is similar to comparison. Additional exophytic cortical cysts in both kidneys as well as smaller subcentimeter hypertension foci too small to fully characterize on CT imaging but statistically likely benign. Moderate bilateral symmetric perinephric stranding, is a stable though nonspecific finding though may correlate with either age or decreased renal function. No evidence of direct  renal injury or perirenal hemorrhage. No extravasation of contrast on excretory delayed images. No evidence of bladder injury. Stomach/Bowel: Moderate hiatal hernia as above. Stomach is unremarkable. Limbs of the patient's inferior vena cava filter closely approximate the posterior wall of the second portion duodenum (6/26), of indeterminate significance. No small bowel dilatation or wall thickening. Some fecalized distal small bowel contents may reflect slowed intestinal transit.Appendix is not well visualized. No focal pericecal inflammation is seen to suggest occult appendicitis. Pancolonic diverticulosis without evidence of acute diverticular inflammation. No colonic dilatation or wall thickening. No evidence of mesenteric contusion or hematoma. Vascular/Lymphatic: Extensive severe atherosclerotic calcification of the abdominal aorta and branch vessels without acute luminal abnormality at this time. No proximal occlusions are clearly evident. No direct vascular injury. No sites of active contrast extravasation Reproductive: Enlarged prostate with indentation of the bladder base. Other: No abdominopelvic free fluid or air. No bowel containing hernias. Extensive right flank and abdominal wall subcutaneous emphysema likely tracking from the chest injuries detailed above. Mild  posterior body wall edema. Musculoskeletal: The osseous structures appear diffusely demineralized which may limit detection of small or nondisplaced fractures. Levocurvature of the lumbar spine with an apex at L2. Moderate degenerative changes throughout the lumbar spine and hips. Postsurgical changes prior lower lumbar posterior decompression. No acute or suspicious osseous lesions in the abdomen or pelvis. IMPRESSION: Traumatic Findings: 1. Large right hemopneumothorax with likely areas of sub pleural pulmonary laceration most pronounced in the posterolateral right lower lobe and the right middle lobe. Findings are secondary to highly  comminuted fractures of the right tenth and eleventh ribs and more minimally displaced fractures of the right lateral third through sixth ribs. 2. Extensive subcutaneous emphysema across the right chest wall and extending into the right shoulder girdle and subclavicular space as well as extending inferiorly across the abdomen and right flank. 3. Split compression deformity of T12 with progressive height loss comparison but overall subacute to chronic appearance though could correlate for point tenderness. Numerous remote left-sided rib fractures is seen as well. 4. No CT evidence of acute traumatic injury to the solid abdominal organs or bowel. Nontraumatic Findings: 1. Severe centrilobular and paraseptal emphysema with chronic appearing bronchitic changes. ( Emphysema (ICD10-J43.9).) 2. Septal thickening and vascular cuffing may reflect some mild superimposed edema. 3. Cardiomegaly with predominantly right heart enlargement and extensive coronary artery atherosclerosis. 4. Small volume of fluid in the pericardium and pericardial recesses, low-attenuation favoring a nontraumatic finding related to fluid status. 5. Limbs of the patient's inferior vena cava filter closely approximate the posterior wall of the second portion duodenum of indeterminate significance. 6. Pancolonic diverticulosis without evidence of acute diverticular inflammation. 7. Enlarged prostate with indentation of the bladder base. 8. Fluid-filled moderate hiatal hernia, correlate for aspiration risk. 9.  Aortic Atherosclerosis (ICD10-I70.0). These results were called by telephone at the time of interpretation on 10/01/2019 at 12:09 am to provider Dr. Florina Ou, who verbally acknowledged these results. Electronically Signed   By: Lovena Le M.D.   On: 10/01/2019 00:10   DG Chest Portable 1 View  Result Date: 09/30/2019 CLINICAL DATA:  Worsening dyspnea. Chest injury after tripping and falling. EXAM: PORTABLE CHEST 1 VIEW COMPARISON:  September 27, 2019. FINDINGS: Interval development of a large amount of subcutaneous emphysema in the right chest wall, shoulder and neck soft tissues. Thin linear air lucency outlines the right heart border. Worsened right lower lung zone interstitial opacities could be atelectatic, inflammatory or infectious. Thin linear lucency pleural air projects at the lung apex. There is no obvious pleural effusion. The right mediastinal soft tissues project increasingly towards the spinal column concerning for mild mediastinal shift. Stable mild cardiomegaly and central pulmonary vascular congestion and chronic interstitial markings are present in both lungs. A displaced fracture of the right tenth rib posterior aspect is new. There is calcified atherosclerosis of the thoracic aorta. Subtle angular contour of the right sixth rib and discontinuity of the lower costochondral calcification could also represent acute nondisplaced fractures. I discussed critical results by telephone at the time of interpretation on 09/30/2019 at 10:27 pm with provider JULIE HAVILAND , who verbally acknowledged these results. IMPRESSION: A new right apical and middle lobe pneumothorax with mild mediastinal shift from right to left and acute right lower rib fractures. Interval development of a large amount of subcutaneous emphysema in the right chest wall, shoulder, and neck soft tissues. Worsened interstitial opacities at the right lung base, may represent atelectasis, inflammatory or infectious. Stable mild cardiomegaly and central pulmonary vascular congestion without obvious  pleural effusion. Thoracic calcified atherosclerosis. Electronically Signed   By: Revonda Humphrey   On: 09/30/2019 22:29    Review of Systems  HENT: Negative for ear discharge, ear pain, hearing loss and tinnitus.   Eyes: Negative for photophobia and pain.  Respiratory: Negative for cough and shortness of breath.   Cardiovascular: Positive for chest pain (right lat thoracic wall).   Gastrointestinal: Negative for abdominal pain, nausea and vomiting.  Genitourinary: Negative for dysuria, flank pain, frequency and urgency.  Musculoskeletal: Negative for back pain, myalgias and neck pain.  Neurological: Negative for dizziness and headaches.  Hematological: Does not bruise/bleed easily.  Psychiatric/Behavioral: The patient is not nervous/anxious.     Blood pressure (!) 171/78, pulse 73, temperature 98.4 F (36.9 C), temperature source Oral, resp. rate 17, height 5\' 10"  (1.778 m), weight 68 kg, SpO2 100 %. Physical Exam Vitals reviewed.  Constitutional:      General: He is not in acute distress.    Appearance: Normal appearance. He is well-developed. He is not diaphoretic.     Interventions: Cervical collar and nasal cannula in place.  HENT:     Head: Normocephalic and atraumatic. No raccoon eyes, Battle's sign, abrasion, contusion or laceration.     Right Ear: Hearing, tympanic membrane, ear canal and external ear normal. No laceration, drainage or tenderness. No foreign body. No hemotympanum. Tympanic membrane is not perforated.     Left Ear: Hearing, tympanic membrane, ear canal and external ear normal. No laceration, drainage or tenderness. No foreign body. No hemotympanum. Tympanic membrane is not perforated.     Nose: Nose normal. No nasal deformity or laceration.     Mouth/Throat:     Mouth: No lacerations.     Pharynx: Uvula midline.  Eyes:     General: Lids are normal. No scleral icterus.    Conjunctiva/sclera: Conjunctivae normal.     Pupils: Pupils are equal, round, and reactive to light.  Neck:     Thyroid: No thyromegaly.     Vascular: No carotid bruit or JVD.     Trachea: Trachea normal.  Cardiovascular:     Rate and Rhythm: Normal rate and regular rhythm.     Pulses: Normal pulses.     Heart sounds: Normal heart sounds.  Pulmonary:     Effort: No respiratory distress.     Breath sounds: Normal breath sounds.  Chest:     Chest wall: Tenderness  (r lateral thoracic wall) present.  Abdominal:     General: There is no distension.     Palpations: Abdomen is soft.     Tenderness: There is no abdominal tenderness. There is no guarding or rebound.  Musculoskeletal:        General: No tenderness. Normal range of motion.     Cervical back: No spinous process tenderness or muscular tenderness.  Lymphadenopathy:     Cervical: No cervical adenopathy.  Skin:    General: Skin is warm and dry.  Neurological:     Mental Status: He is alert and oriented to person, place, and time.     GCS: GCS eye subscore is 4. GCS verbal subscore is 5. GCS motor subscore is 6.     Cranial Nerves: No cranial nerve deficit.     Sensory: No sensory deficit.  Psychiatric:        Speech: Speech normal.        Behavior: Behavior normal. Behavior is cooperative.     Assessment/Plan 86 year old male status post fall Right 3-6, 10-11  rib fractures Right hemopneumothorax Chronic kidney disease History of COPD History of multiple basal cell carcinomas, melanoma  1.  We will admit him to the hospital, be transferred to [facility]. 2.  Continue oxygen therapy, pulmonary toilet, pain control 3.  I discussed with the patient's daughter as well as the patient that due to his age and comorbidities there is a high probability that he may end up requiring chest tube placement, possible ventilator support.  Patient currently is DNR.  Patient's daughter is unsure whether or not the patient is DNI.  Ralene Ok 10/01/2019, 1:22 AM   Procedures

## 2019-10-01 NOTE — Progress Notes (Signed)
Pt had a chest tube placed today by Dr. Bobbye Morton at approximately 1300  Consent signed, bedside procedure checklist and time out performed. Pt tolerated as well as expected. Trauma RN Nira Conn assisted. Immediate return of bloody drainage in chest tube. To -20cm  H20 Suction as per orders. Simmie Davies RN  [phone]. Verbal orders received from Dr. Deland Pretty for pain medication if anesthesiology does not come tonight. To be used ONLY if no epidural access placed. BBell RN  [phone] to 1910. Dr Ermalene Postin from anesthesiology came to place epidural for pain control. CRNA came to assist as RN was tied up. Dr. Ermalene Postin hung and titrated medication into   Epidural port which is labeled with orange label. He noted BP would be soft and was ordering Albumin. He called Dr. Bobbye Morton and updated her. BP was soft and reported to oncoming shift RN Lorriane Shire. Albumin retrieved from pharmacy for RN Lorriane Shire. Pt arousable easily and states he is feeling less pain. Of note, Left pupil is larger than Right from past Glaucoma surgery, confirmed by daughter. Full day shift report to [employer]. As of 2000 Albumin was being hung by [employer]. BP still oft with MAP in 50's but patient asymptomatic. Chest tube pleua-rvac noted to be leaking at suction hose nipple site. Taped around leak, Francoise Schaumann will obtain replacement pleura-vac. Simmie Davies RN [phone].

## 2019-10-01 NOTE — ED Notes (Signed)
Pt transferred to [facility] via [facility]. [facility] unavailable for transport. Daughter aware and will meet pt at [facility]. Pt in NAD. No complaints voiced. Belongings with family.

## 2019-10-01 NOTE — Procedures (Signed)
   Procedure Note  Date: 10/01/2019  Procedure: tube thoracostomy--right    Pre-op diagnosis: right pneumohemothorax  Post-op diagnosis: same  Surgeon: Jesusita Oka, MD  Anesthesia: local, 20cc   EBL: <5cc procedural; 300cc blood evacuated Drains/Implants: 36F chest tube Specimen: none  Description of procedure: Time-out was performed verifying correct patient, procedure, site, laterality, and signature of informed consent. Ten cc's of local anesthetic was infiltrated into the tissues just over the fourth intercostal space. A small skin nick was made at the fourth intercostal space. An introducer needle was inserted and a guidewire inserted through the needle. The wire was radiographically confirmed to be intrathoracic, after the needle was removed and before the tract was dilated. The chest tube was inserted over the guidewire and the guidewire removed. Post-placement CXR showed the chest tube being extra-thoracic.  After discussion with the patient and his daughter, replacement of the chest tube was performed with a large bore chest tube. An additional 10cc of local was infiltrated and a longitudinal incision was made parallel to the rib at the fourth intercostal space, anterior to the previously placed pigtail site. This incision was deepened down through the muscle until the pleural cavity was entered. An audible air rush was encountered upon entry and a 36F chest tube was inserted through this tract.   The tube was secured at the skin with suture and connected to an atrium at -20cm water wall suction. Immediate output from the chest tube was 300cc and was bloody. The site was dressed with xeroform, gauze, and tape. The patient tolerated the procedure well. There were no complications. Follow up chest x-ray was ordered to confirm tube positioning, evacuation, and lung re-expansion.    Jesusita Oka, MD General and [facility] [place] [facility]

## 2019-10-02 ENCOUNTER — Inpatient Hospital Stay (HOSPITAL_COMMUNITY): Payer: Medicare Other

## 2019-10-02 LAB — CBC
HCT: 37.1 % — ABNORMAL LOW (ref 39.0–52.0)
Hemoglobin: 11.7 g/dL — ABNORMAL LOW (ref 13.0–17.0)
MCH: 31.5 pg (ref 26.0–34.0)
MCHC: 31.5 g/dL (ref 30.0–36.0)
MCV: 100 fL (ref 80.0–100.0)
Platelets: 192 10*3/uL (ref 150–400)
RBC: 3.71 MIL/uL — ABNORMAL LOW (ref 4.22–5.81)
RDW: 14.4 % (ref 11.5–15.5)
WBC: 13.3 10*3/uL — ABNORMAL HIGH (ref 4.0–10.5)
nRBC: 0 % (ref 0.0–0.2)

## 2019-10-02 LAB — BASIC METABOLIC PANEL
Anion gap: 9 (ref 5–15)
BUN: 20 mg/dL (ref 8–23)
CO2: 21 mmol/L — ABNORMAL LOW (ref 22–32)
Calcium: 7.6 mg/dL — ABNORMAL LOW (ref 8.9–10.3)
Chloride: 112 mmol/L — ABNORMAL HIGH (ref 98–111)
Creatinine, Ser: 1.62 mg/dL — ABNORMAL HIGH (ref 0.61–1.24)
GFR calc Af Amer: 42 mL/min — ABNORMAL LOW (ref >60)
GFR calc non Af Amer: 36 mL/min — ABNORMAL LOW (ref >60)
Glucose, Bld: 99 mg/dL (ref 70–99)
Potassium: 4.3 mmol/L (ref 3.5–5.1)
Sodium: 142 mmol/L (ref 135–145)

## 2019-10-02 MED ORDER — ROPINIROLE HCL 1 MG PO TABS
2.0000 mg | ORAL_TABLET | Freq: Every day | ORAL | Status: DC
  Administered 2019-10-03 – 2019-10-11 (×8): 2 mg via ORAL
  Filled 2019-10-02 (×8): qty 2

## 2019-10-02 MED ORDER — CHLORHEXIDINE GLUCONATE CLOTH 2 % EX PADS
6.0000 | MEDICATED_PAD | Freq: Every day | CUTANEOUS | Status: AC
  Administered 2019-10-02 – 2019-10-06 (×5): 6 via TOPICAL

## 2019-10-02 MED ORDER — ROPINIROLE HCL 1 MG PO TABS
1.0000 mg | ORAL_TABLET | Freq: Every day | ORAL | Status: DC
  Administered 2019-10-03 – 2019-10-12 (×10): 1 mg via ORAL
  Filled 2019-10-02 (×10): qty 1

## 2019-10-02 NOTE — Progress Notes (Signed)
Patient noted with 37mls of urine output during shift. Bladder scan performed,  >542mls noted. Dr. Deland Pretty notified, new order given to place foley catheter.

## 2019-10-02 NOTE — Evaluation (Signed)
Physical Therapy Evaluation Patient Details Name: Micheal Frank MRN: [id number] DOB: [birth year] Today's Date: 10/02/2019   History of Present Illness  86 year old male who arrives with a history of chronic kidney disease, COPD, history of brain bleed, h/o epidural abscess, blood clots, memory loss, recent pneumonia, who fell at home. Daughter states that he was likely trying to close a curtain, fell in between some chairs, landing on his right side onto some chairs. CT scan--right hemopneumothorax, right ribs 3-6, 10-11 fractures. 3/17 rt chest tube  Clinical Impression   Pt admitted with above diagnosis. Patient very independent PTA--per daughter was supposed to use cane or RW however often went without either. Currently limited by hypotension (while seated EOB x 10 minutes) and pain. Educated pt and daughter on risks of inactivity. Patient has good family support (can provide 24/7 assist per daughter).  Pt currently with functional limitations due to the deficits listed below (see PT Problem List). Pt will benefit from skilled PT to increase their independence and safety with mobility to allow discharge to the venue listed below.       Follow Up Recommendations Home health PT;Supervision/Assistance - 24 hour    Equipment Recommendations  None recommended by PT    Recommendations for Other Services       Precautions / Restrictions Precautions Precautions: Fall Precaution Comments: R chest tube, epidural Restrictions Weight Bearing Restrictions: No      Mobility  Bed Mobility Overal bed mobility: Needs Assistance Bed Mobility: Supine to Sit;Sit to Supine     Supine to sit: +2 for physical assistance;Total assist;HOB elevated Sit to supine: +2 for physical assistance;Total assist;HOB elevated   General bed mobility comments: elevated HOB maximally, guided LEs over EOB toward Lt side, pivoted hips with bed pad and assisted trunk, assisted for all aspects to return to  supine  Transfers                 General transfer comment: deferred due to nausea, pain and hypotension  Ambulation/Gait             General Gait Details: deferred due to nausea, pain and hypotension  Stairs            Wheelchair Mobility    Modified Rankin (Stroke Patients Only)       Balance Overall balance assessment: Needs assistance Sitting-balance support: Feet supported Sitting balance-Leahy Scale: Fair Sitting balance - Comments: statically x 10 minutes                                     Pertinent Vitals/Pain Pain Assessment: Faces Faces Pain Scale: Hurts even more Pain Location: R chest tube site/ribs Pain Descriptors / Indicators: Guarding;Grimacing;Discomfort Pain Intervention(s): Limited activity within patient's tolerance;Monitored during session;Premedicated before session;Repositioned    Home Living Family/patient expects to be discharged to:: Private residence Living Arrangements: Children;Other relatives Available Help at Discharge: Available 24 hours/day Type of Home: House Home Access: Ramped entrance     Home Layout: Multi-level;Able to live on main level with bedroom/bathroom Home Equipment: Kasandra Knudsen - single point;Shower seat;Walker - 2 wheels;Grab bars - tub/shower;Hand held shower head      Prior Function Level of Independence: Needs assistance   Gait / Transfers Assistance Needed: walks with cane (he prefers) or RW (uses first thing in the morning)  ADL's / Homemaking Assistance Needed: gets SOB with bathing and dressing; assisted for IADL  Comments: daughter reports  he falls infrequently      Hand Dominance   Dominant Hand: Right    Extremity/Trunk Assessment   Upper Extremity Assessment Upper Extremity Assessment: Defer to OT evaluation    Lower Extremity Assessment Lower Extremity Assessment: Generalized weakness    Cervical / Trunk Assessment Cervical / Trunk Assessment: Other  exceptions Cervical / Trunk Exceptions: painful ribs, chest tube to suction  Communication   Communication: HOH(hears best out of L ear)  Cognition Arousal/Alertness: Awake/alert Behavior During Therapy: WFL for tasks assessed/performed Overall Cognitive Status: Within Functional Limits for tasks assessed                                 General Comments: per daughter, pt has some short term memory deficits at baseline      General Comments General comments (skin integrity, edema, etc.): Patient initially refusing to participate; daughter arrived and was able to persuade him to try    Exercises     Assessment/Plan    PT Assessment Patient needs continued PT services  PT Problem List Decreased strength;Decreased activity tolerance;Decreased mobility;Decreased cognition;Decreased knowledge of use of DME;Decreased safety awareness;Decreased knowledge of precautions;Cardiopulmonary status limiting activity;Pain       PT Treatment Interventions DME instruction;Gait training;Functional mobility training;Therapeutic activities;Therapeutic exercise;Balance training;Neuromuscular re-education;Cognitive remediation;Patient/family education    PT Goals (Current goals can be found in the Care Plan section)  Acute Rehab PT Goals Patient Stated Goal: to return home PT Goal Formulation: With patient/family Time For Goal Achievement: 10/16/19 Potential to Achieve Goals: Good    Frequency Min 5X/week   Barriers to discharge        Co-evaluation PT/OT/SLP Co-Evaluation/Treatment: Yes Reason for Co-Treatment: Complexity of the patient's impairments (multi-system involvement);For patient/therapist safety;To address functional/ADL transfers;Other (comment)(pain level) PT goals addressed during session: Mobility/safety with mobility;Balance OT goals addressed during session: Strengthening/ROM;ADL's and self-care       AM-PAC PT "6 Clicks" Mobility  Outcome Measure Help needed  turning from your back to your side while in a flat bed without using bedrails?: Total Help needed moving from lying on your back to sitting on the side of a flat bed without using bedrails?: Total Help needed moving to and from a bed to a chair (including a wheelchair)?: Total Help needed standing up from a chair using your arms (e.g., wheelchair or bedside chair)?: Total Help needed to walk in hospital room?: Total Help needed climbing 3-5 steps with a railing? : Total 6 Click Score: 6    End of Session Equipment Utilized During Treatment: Oxygen Activity Tolerance: Patient limited by pain;Treatment limited secondary to medical complications (Comment)(hypotension) Patient left: in bed;with call bell/phone within reach;with bed alarm set;with family/visitor present Nurse Communication: Mobility status;Other (comment)(hypotension) PT Visit Diagnosis: Muscle weakness (generalized) (M62.81);History of falling (Z91.81);Difficulty in walking, not elsewhere classified (R26.2)    Time: [id number] PT Time Calculation (min) (ACUTE ONLY): 35 min   Charges:   PT Evaluation $PT Eval High Complexity: 1 High           Arby Barrette, PT Pager [phone]   Rexanne Mano 10/02/2019, 5:50 PM

## 2019-10-02 NOTE — Progress Notes (Signed)
Patient ID: Micheal Frank, male   DOB: [birth year], 86 y.o.   MRN: [id number]       Subjective: Complaining mostly of some right sided chest pain, but of restless legs which is a problem at home as well.  Tolerating some of his diet but not eating a ton.  Had urinary retention last night and foley was placed with 1L of output.  ROS: See above, otherwise other systems negative  Objective: Vital signs in last 24 hours: Temp:  [97.6 F (36.4 C)-98.8 F (37.1 C)] 98.2 F (36.8 C) (03/18 1158) Pulse Rate:  [56-75] 68 (03/18 1158) Resp:  [13-31] 17 (03/18 1158) BP: (73-119)/(41-69) 110/69 (03/18 1158) SpO2:  [88 %-100 %] 92 % (03/18 1158) Last BM Date: (PTA )  Intake/Output from previous day: 03/17 0701 - 03/18 0700 In: 725 [P.O.:300] Out: 600 [Urine:600] Intake/Output this shift: Total I/O In: 200 [P.O.:200] Out: 170 [Chest Tube:170]  PE: Gen: elderly frail man in NAD HEENT: PERRL, HOH Heart: regular Lungs: expiratory wheeze on right side, otherwise good BS bilaterally.  Right chest tube in place with serosang output in cannister.  No airleak noted.  Tube insertion site is clean Abd: soft, NT, ND GU: foley in place with yellow urine Ext: MAE Psych: A&Ox3  Lab Results:  Recent Labs    10/01/19 0453 10/02/19 0516  WBC 18.1* 13.3*  HGB 13.0 11.7*  HCT 42.1 37.1*  PLT 239 192   BMET Recent Labs    10/01/19 0453 10/02/19 0516  NA 142 142  K 4.7 4.3  CL 112* 112*  CO2 22 21*  GLUCOSE 144* 99  BUN 19 20  CREATININE 1.47* 1.62*  CALCIUM 7.9* 7.6*   PT/INR Recent Labs    10/01/19 1416  LABPROT 14.3  INR 1.1   CMP     Component Value Date/Time   NA 142 10/02/2019 0516   K 4.3 10/02/2019 0516   CL 112 (H) 10/02/2019 0516   CO2 21 (L) 10/02/2019 0516   GLUCOSE 99 10/02/2019 0516   BUN 20 10/02/2019 0516   CREATININE 1.62 (H) 10/02/2019 0516   CREATININE 1.27 07/24/2011 1133   CALCIUM 7.6 (L) 10/02/2019 0516   PROT 6.6 10/16/2018 1200   ALBUMIN 3.6  10/16/2018 1200   AST 21 10/16/2018 1200   ALT 9 10/16/2018 1200   ALKPHOS 71 10/16/2018 1200   BILITOT 0.8 10/16/2018 1200   GFRNONAA 36 (L) 10/02/2019 0516   GFRAA 42 (L) 10/02/2019 0516   Lipase     Component Value Date/Time   LIPASE 20 07/23/2017 1145       Studies/Results: DG Chest 2 View  Result Date: 10/02/2019 CLINICAL DATA:  Pneumothorax EXAM: CHEST - 2 VIEW COMPARISON:  October 01, 2019 FINDINGS: There is a chest tube on the right which is unchanged in position with the tip directed inferiorly and medially on the right. Right apical and apicolateral pneumothorax is unchanged in size. No tension component. There is extensive subcutaneous air throughout the right hemithorax. There is bibasilar atelectasis. No airspace consolidation. Heart is borderline enlarged with pulmonary vascularity normal. No adenopathy. There is aortic atherosclerosis. Bones are osteoporotic. IMPRESSION: Stable pneumothorax on the right. Chest tube position unchanged. Extensive subcutaneous air on the right. Bibasilar atelectasis. No consolidation. Stable cardiac silhouette. Aortic Atherosclerosis (ICD10-I70.0). Electronically Signed   By: Lowella Grip III M.D.   On: 10/02/2019 08:46   CT Chest W Contrast  Result Date: 10/01/2019 CLINICAL DATA:  [place] while trying to stand, abnormal  chest radiograph EXAM: CT CHEST WITH CONTRAST CT ABDOMEN AND PELVIS WITH AND WITHOUT CONTRAST TECHNIQUE: Multidetector CT imaging of the chest was performed during intravenous contrast administration. Multidetector CT imaging of the abdomen and pelvis was performed following the standard protocol before and during bolus administration of intravenous contrast. CONTRAST:  69mL OMNIPAQUE IOHEXOL 300 MG/ML  SOLN COMPARISON:  CT abdomen pelvis 06/23/2017, same day chest radiograph, CT chest December 19, 2010 FINDINGS: CT CHEST FINDINGS Cardiovascular: Cardiomegaly with predominantly right heart enlargement. Small volume of fluid in the  pericardium and pericardial recesses. Extensive three-vessel coronary artery disease. Calcifications noted upon the aortic leaflets. The aortic root is suboptimally assessed given cardiac pulsation artifact. Extensive calcification throughout the thoracic aorta. No intramural hematoma, dissection flap or other acute luminal abnormality of the aorta is seen. No periaortic stranding or hemorrhage. Central pulmonary arteries are normal caliber. Slight tortuosity of the right pulmonary artery likely related to volume changes in the right chest. No large filling defects on this non tailored examination. Mediastinum/Nodes: No acute traumatic abnormality of the trachea. Coronal narrowing compatible with COPD. Moderate hiatal hernia. No acute traumatic abnormality of the esophagus. Few subcentimeter nodules in the thyroid gland, these do not warrant further evaluation in a patient of this age. Thoracic inlet otherwise unremarkable. No mediastinal, hilar discernible axillary adenopathy. Lungs/Pleura: Large right hemo pneumothorax with a likely areas sub pleural pulmonary laceration in the right middle lobe (3/121) adjacent minimally displaced fractures of the right anterolateral third through sixth rib. More extensive areas of consolidated lung and volume loss adjacent pleural fluid and layering hemorrhage with likely underlying pulmonary laceration seen in the posterior right lower lobe adjacent the highly comminuted fractures of the a tenth and eleventh ribs. Bandlike areas of opacity in the left lung favor atelectasis and perhaps mild edema with interlobular septal thickening and mild vascular cuffing of findings on a background of severe centrilobular and paraseptal emphysema with chronic appearing bronchitic changes. Musculoskeletal: There are minimally displaced fractures of the right anterolateral third fourth and fifth rib as well as highly comminuted posterior rib fractures of the right tenth and eleventh ribs.  Severe subcutaneous emphysema is noted across the right chest wall and extending into the right shoulder girdle in in the soft tissues of the base of the right neck and in the subclavicular space as well. Remote posterior left seventh, eighth, tenth and eleventh rib fractures seen posteriorly as well as rim of lateral sixth and seventh rib fractures. No acute osseous injury of the left chest wall or shoulder. Severe multilevel degenerative changes are present in the spine thoracic spine. There is been progressive vertebral body height loss at the level of a split type deformity AO spine B2) at T12. Multilevel flowing anterior osteophytosis, compatible with features of diffuse idiopathic skeletal hyperostosis (DISH). Mild bilateral gynecomastia. CT ABDOMEN AND PELVIS FINDINGS Hepatobiliary: No direct hepatic injury or perihepatic hematoma. No suspicious liver lesion. Calcified gallstone layering dependently towards the gallbladder neck. No biliary ductal dilatation or pericholecystic inflammation is seen. Pancreas: Mild pancreatic atrophy. No peripancreatic inflammation or ductal dilatation. No pancreatic contusive changes. Spleen: No direct splenic injury or perisplenic hematoma. Adrenals/Urinary Tract: Normal adrenal glands, no adrenal hemorrhage. Fairly symmetric bilateral renal atrophy. There is a large right parapelvic cyst which is similar to comparison. Additional exophytic cortical cysts in both kidneys as well as smaller subcentimeter hypertension foci too small to fully characterize on CT imaging but statistically likely benign. Moderate bilateral symmetric perinephric stranding, is a stable though nonspecific  finding though may correlate with either age or decreased renal function. No evidence of direct renal injury or perirenal hemorrhage. No extravasation of contrast on excretory delayed images. No evidence of bladder injury. Stomach/Bowel: Moderate hiatal hernia as above. Stomach is unremarkable. Limbs  of the patient's inferior vena cava filter closely approximate the posterior wall of the second portion duodenum (6/26), of indeterminate significance. No small bowel dilatation or wall thickening. Some fecalized distal small bowel contents may reflect slowed intestinal transit.Appendix is not well visualized. No focal pericecal inflammation is seen to suggest occult appendicitis. Pancolonic diverticulosis without evidence of acute diverticular inflammation. No colonic dilatation or wall thickening. No evidence of mesenteric contusion or hematoma. Vascular/Lymphatic: Extensive severe atherosclerotic calcification of the abdominal aorta and branch vessels without acute luminal abnormality at this time. No proximal occlusions are clearly evident. No direct vascular injury. No sites of active contrast extravasation Reproductive: Enlarged prostate with indentation of the bladder base. Other: No abdominopelvic free fluid or air. No bowel containing hernias. Extensive right flank and abdominal wall subcutaneous emphysema likely tracking from the chest injuries detailed above. Mild posterior body wall edema. Musculoskeletal: The osseous structures appear diffusely demineralized which may limit detection of small or nondisplaced fractures. Levocurvature of the lumbar spine with an apex at L2. Moderate degenerative changes throughout the lumbar spine and hips. Postsurgical changes prior lower lumbar posterior decompression. No acute or suspicious osseous lesions in the abdomen or pelvis. IMPRESSION: Traumatic Findings: 1. Large right hemopneumothorax with likely areas of sub pleural pulmonary laceration most pronounced in the posterolateral right lower lobe and the right middle lobe. Findings are secondary to highly comminuted fractures of the right tenth and eleventh ribs and more minimally displaced fractures of the right lateral third through sixth ribs. 2. Extensive subcutaneous emphysema across the right chest wall and  extending into the right shoulder girdle and subclavicular space as well as extending inferiorly across the abdomen and right flank. 3. Split compression deformity of T12 with progressive height loss comparison but overall subacute to chronic appearance though could correlate for point tenderness. Numerous remote left-sided rib fractures is seen as well. 4. No CT evidence of acute traumatic injury to the solid abdominal organs or bowel. Nontraumatic Findings: 1. Severe centrilobular and paraseptal emphysema with chronic appearing bronchitic changes. ( Emphysema (ICD10-J43.9).) 2. Septal thickening and vascular cuffing may reflect some mild superimposed edema. 3. Cardiomegaly with predominantly right heart enlargement and extensive coronary artery atherosclerosis. 4. Small volume of fluid in the pericardium and pericardial recesses, low-attenuation favoring a nontraumatic finding related to fluid status. 5. Limbs of the patient's inferior vena cava filter closely approximate the posterior wall of the second portion duodenum of indeterminate significance. 6. Pancolonic diverticulosis without evidence of acute diverticular inflammation. 7. Enlarged prostate with indentation of the bladder base. 8. Fluid-filled moderate hiatal hernia, correlate for aspiration risk. 9.  Aortic Atherosclerosis (ICD10-I70.0). These results were called by telephone at the time of interpretation on 10/01/2019 at 12:09 am to provider Dr. Florina Ou, who verbally acknowledged these results. Electronically Signed   By: Lovena Le M.D.   On: 10/01/2019 00:10   CT ABDOMEN PELVIS W CONTRAST  Result Date: 10/01/2019 CLINICAL DATA:  [place] while trying to stand, abnormal chest radiograph EXAM: CT CHEST WITH CONTRAST CT ABDOMEN AND PELVIS WITH AND WITHOUT CONTRAST TECHNIQUE: Multidetector CT imaging of the chest was performed during intravenous contrast administration. Multidetector CT imaging of the abdomen and pelvis was performed following the  standard protocol before and during bolus  administration of intravenous contrast. CONTRAST:  74mL OMNIPAQUE IOHEXOL 300 MG/ML  SOLN COMPARISON:  CT abdomen pelvis 06/23/2017, same day chest radiograph, CT chest December 19, 2010 FINDINGS: CT CHEST FINDINGS Cardiovascular: Cardiomegaly with predominantly right heart enlargement. Small volume of fluid in the pericardium and pericardial recesses. Extensive three-vessel coronary artery disease. Calcifications noted upon the aortic leaflets. The aortic root is suboptimally assessed given cardiac pulsation artifact. Extensive calcification throughout the thoracic aorta. No intramural hematoma, dissection flap or other acute luminal abnormality of the aorta is seen. No periaortic stranding or hemorrhage. Central pulmonary arteries are normal caliber. Slight tortuosity of the right pulmonary artery likely related to volume changes in the right chest. No large filling defects on this non tailored examination. Mediastinum/Nodes: No acute traumatic abnormality of the trachea. Coronal narrowing compatible with COPD. Moderate hiatal hernia. No acute traumatic abnormality of the esophagus. Few subcentimeter nodules in the thyroid gland, these do not warrant further evaluation in a patient of this age. Thoracic inlet otherwise unremarkable. No mediastinal, hilar discernible axillary adenopathy. Lungs/Pleura: Large right hemo pneumothorax with a likely areas sub pleural pulmonary laceration in the right middle lobe (3/121) adjacent minimally displaced fractures of the right anterolateral third through sixth rib. More extensive areas of consolidated lung and volume loss adjacent pleural fluid and layering hemorrhage with likely underlying pulmonary laceration seen in the posterior right lower lobe adjacent the highly comminuted fractures of the a tenth and eleventh ribs. Bandlike areas of opacity in the left lung favor atelectasis and perhaps mild edema with interlobular septal  thickening and mild vascular cuffing of findings on a background of severe centrilobular and paraseptal emphysema with chronic appearing bronchitic changes. Musculoskeletal: There are minimally displaced fractures of the right anterolateral third fourth and fifth rib as well as highly comminuted posterior rib fractures of the right tenth and eleventh ribs. Severe subcutaneous emphysema is noted across the right chest wall and extending into the right shoulder girdle in in the soft tissues of the base of the right neck and in the subclavicular space as well. Remote posterior left seventh, eighth, tenth and eleventh rib fractures seen posteriorly as well as rim of lateral sixth and seventh rib fractures. No acute osseous injury of the left chest wall or shoulder. Severe multilevel degenerative changes are present in the spine thoracic spine. There is been progressive vertebral body height loss at the level of a split type deformity AO spine B2) at T12. Multilevel flowing anterior osteophytosis, compatible with features of diffuse idiopathic skeletal hyperostosis (DISH). Mild bilateral gynecomastia. CT ABDOMEN AND PELVIS FINDINGS Hepatobiliary: No direct hepatic injury or perihepatic hematoma. No suspicious liver lesion. Calcified gallstone layering dependently towards the gallbladder neck. No biliary ductal dilatation or pericholecystic inflammation is seen. Pancreas: Mild pancreatic atrophy. No peripancreatic inflammation or ductal dilatation. No pancreatic contusive changes. Spleen: No direct splenic injury or perisplenic hematoma. Adrenals/Urinary Tract: Normal adrenal glands, no adrenal hemorrhage. Fairly symmetric bilateral renal atrophy. There is a large right parapelvic cyst which is similar to comparison. Additional exophytic cortical cysts in both kidneys as well as smaller subcentimeter hypertension foci too small to fully characterize on CT imaging but statistically likely benign. Moderate bilateral  symmetric perinephric stranding, is a stable though nonspecific finding though may correlate with either age or decreased renal function. No evidence of direct renal injury or perirenal hemorrhage. No extravasation of contrast on excretory delayed images. No evidence of bladder injury. Stomach/Bowel: Moderate hiatal hernia as above. Stomach is unremarkable. Limbs of the patient's  inferior vena cava filter closely approximate the posterior wall of the second portion duodenum (6/26), of indeterminate significance. No small bowel dilatation or wall thickening. Some fecalized distal small bowel contents may reflect slowed intestinal transit.Appendix is not well visualized. No focal pericecal inflammation is seen to suggest occult appendicitis. Pancolonic diverticulosis without evidence of acute diverticular inflammation. No colonic dilatation or wall thickening. No evidence of mesenteric contusion or hematoma. Vascular/Lymphatic: Extensive severe atherosclerotic calcification of the abdominal aorta and branch vessels without acute luminal abnormality at this time. No proximal occlusions are clearly evident. No direct vascular injury. No sites of active contrast extravasation Reproductive: Enlarged prostate with indentation of the bladder base. Other: No abdominopelvic free fluid or air. No bowel containing hernias. Extensive right flank and abdominal wall subcutaneous emphysema likely tracking from the chest injuries detailed above. Mild posterior body wall edema. Musculoskeletal: The osseous structures appear diffusely demineralized which may limit detection of small or nondisplaced fractures. Levocurvature of the lumbar spine with an apex at L2. Moderate degenerative changes throughout the lumbar spine and hips. Postsurgical changes prior lower lumbar posterior decompression. No acute or suspicious osseous lesions in the abdomen or pelvis. IMPRESSION: Traumatic Findings: 1. Large right hemopneumothorax with likely  areas of sub pleural pulmonary laceration most pronounced in the posterolateral right lower lobe and the right middle lobe. Findings are secondary to highly comminuted fractures of the right tenth and eleventh ribs and more minimally displaced fractures of the right lateral third through sixth ribs. 2. Extensive subcutaneous emphysema across the right chest wall and extending into the right shoulder girdle and subclavicular space as well as extending inferiorly across the abdomen and right flank. 3. Split compression deformity of T12 with progressive height loss comparison but overall subacute to chronic appearance though could correlate for point tenderness. Numerous remote left-sided rib fractures is seen as well. 4. No CT evidence of acute traumatic injury to the solid abdominal organs or bowel. Nontraumatic Findings: 1. Severe centrilobular and paraseptal emphysema with chronic appearing bronchitic changes. ( Emphysema (ICD10-J43.9).) 2. Septal thickening and vascular cuffing may reflect some mild superimposed edema. 3. Cardiomegaly with predominantly right heart enlargement and extensive coronary artery atherosclerosis. 4. Small volume of fluid in the pericardium and pericardial recesses, low-attenuation favoring a nontraumatic finding related to fluid status. 5. Limbs of the patient's inferior vena cava filter closely approximate the posterior wall of the second portion duodenum of indeterminate significance. 6. Pancolonic diverticulosis without evidence of acute diverticular inflammation. 7. Enlarged prostate with indentation of the bladder base. 8. Fluid-filled moderate hiatal hernia, correlate for aspiration risk. 9.  Aortic Atherosclerosis (ICD10-I70.0). These results were called by telephone at the time of interpretation on 10/01/2019 at 12:09 am to provider Dr. Florina Ou, who verbally acknowledged these results. Electronically Signed   By: Lovena Le M.D.   On: 10/01/2019 00:10   DG CHEST PORT 1  VIEW  Result Date: 10/01/2019 CLINICAL DATA:  Status post chest tube placement. EXAM: PORTABLE CHEST 1 VIEW COMPARISON:  Earlier this day. FINDINGS: Right pigtail catheter is been removed. There is a new right chest tube with tip directed towards the lung base. Small pneumothorax extending from the apex to the base laterally. Extensive subcutaneous emphysema about the right chest wall. Right lower rib fractures again seen. Increasing atelectasis at the bases. Unchanged heart size and mediastinal contours. No significant pleural fluid IMPRESSION: Removal of right pigtail catheter with new right chest tube, tip directed towards the base. Small right pneumothorax. Extensive subcutaneous emphysema about the  right chest wall. Aortic Atherosclerosis (ICD10-I70.0). Electronically Signed   By: Keith Rake M.D.   On: 10/01/2019 13:33   DG CHEST PORT 1 VIEW  Result Date: 10/01/2019 CLINICAL DATA:  Chest tube placement. EXAM: PORTABLE CHEST 1 VIEW COMPARISON:  10/01/2019 FINDINGS: New right-sided chest tube does not enter the right hemithorax. It lies in the subcutaneous soft tissues of the lateral mid chest surrounded by subcutaneous emphysema. The previously seen chest tube has been removed. Moderate right pneumothorax appears slightly larger than on the earlier exam. Extensive right-sided subcutaneous emphysema is stable. IMPRESSION: 1. Misplaced right-sided chest tube, lying in the subcutaneous soft tissues the right lateral chest, external to the thoracic cavity. Critical Value/emergent results were called by telephone at the time of interpretation on 10/01/2019 at 12:48 pm to the patient's nurse, Luetta Nutting, who verbally acknowledged these results. 2. Slight increase in the size of a moderate right pneumothorax. Electronically Signed   By: Lajean Manes M.D.   On: 10/01/2019 12:49   DG CHEST PORT 1 VIEW  Result Date: 10/01/2019 CLINICAL DATA:  Right-sided pneumothorax. Check wire position for chest tube  placement EXAM: PORTABLE CHEST 1 VIEW COMPARISON:  September 30, 2019 FINDINGS: Pneumothorax again noted on the right, increased in size from 1 day prior. Wire is present on the right overlying the medial upper hemithorax. There is extensive subcutaneous air on the right, a finding also present previously. Atelectatic changes noted in the left base. Ill-defined opacity in the right mid lung is likely due to atelectasis secondary to increase in pneumothorax size on the right. Heart size and pulmonary vascular normal. No adenopathy. There is aortic atherosclerosis. Bones are osteoporotic. IMPRESSION: Pneumothorax is increased in size compared to 1 day prior. Wire is present at the level of the medial upper thorax at the superior hilar level. Atelectasis left base. Ill-defined opacity right mid lung likely of atelectatic etiology. Stable cardiac silhouette.  Aortic Atherosclerosis (ICD10-I70.0). Electronically Signed   By: Lowella Grip III M.D.   On: 10/01/2019 12:25   DG Chest Portable 1 View  Result Date: 09/30/2019 CLINICAL DATA:  Worsening dyspnea. Chest injury after tripping and falling. EXAM: PORTABLE CHEST 1 VIEW COMPARISON:  September 27, 2019. FINDINGS: Interval development of a large amount of subcutaneous emphysema in the right chest wall, shoulder and neck soft tissues. Thin linear air lucency outlines the right heart border. Worsened right lower lung zone interstitial opacities could be atelectatic, inflammatory or infectious. Thin linear lucency pleural air projects at the lung apex. There is no obvious pleural effusion. The right mediastinal soft tissues project increasingly towards the spinal column concerning for mild mediastinal shift. Stable mild cardiomegaly and central pulmonary vascular congestion and chronic interstitial markings are present in both lungs. A displaced fracture of the right tenth rib posterior aspect is new. There is calcified atherosclerosis of the thoracic aorta. Subtle angular  contour of the right sixth rib and discontinuity of the lower costochondral calcification could also represent acute nondisplaced fractures. I discussed critical results by telephone at the time of interpretation on 09/30/2019 at 10:27 pm with provider JULIE HAVILAND , who verbally acknowledged these results. IMPRESSION: A new right apical and middle lobe pneumothorax with mild mediastinal shift from right to left and acute right lower rib fractures. Interval development of a large amount of subcutaneous emphysema in the right chest wall, shoulder, and neck soft tissues. Worsened interstitial opacities at the right lung base, may represent atelectasis, inflammatory or infectious. Stable mild cardiomegaly and central pulmonary vascular  congestion without obvious pleural effusion. Thoracic calcified atherosclerosis. Electronically Signed   By: Revonda Humphrey   On: 09/30/2019 22:29    Anti-infectives: Anti-infectives (From admission, onward)   None       Assessment/Plan 86 year old male status post fall Right 3-6, 10-11 rib fractures - anesthesia placed epidural yesterday to help with pain control.  Continue today.  Defer to them on dosing and ability to increase or stay the same given some low BPs.  Albumin given and on LR. Right hemopneumothorax - CXR today still shows a small PTX after large bore CT placed yesterday.  Continue to suction today and repeat CXR in am Chronic kidney disease/urinary retention - slight increase today to 1.6.  May be secondary to obstruction from urinary retention.  Continue IVFs and recheck in am History of COPD- continue home nebulizers and medications History of multiple basal cell carcinomas, melanoma RLS - cont requip FEN - dysphagia 3 diet, IVFs VTE - hold lovenox while epidural in place, SCDs ID - none currently   LOS: 1 day    Henreitta Cea , [facility] 10/02/2019, 12:24 PM Please see Amion for pager number during day hours  7:00am-4:30pm or 7:00am -11:30am on weekends

## 2019-10-02 NOTE — Evaluation (Signed)
Occupational Therapy Evaluation Patient Details Name: BRECKYN GAIR MRN: [id number] DOB: 1926/07/11 Today's Date: 10/02/2019    History of Present Illness 86 year old male who arrives with a history of chronic kidney disease, COPD, history of brain bleed, h/o epidural abscess, blood clots, memory loss, recent pneumonia, who fell at home. Daughter states that he was likely trying to close a curtain, fell in between some chairs, landing on his right side onto some chairs. CT scan--right hemopneumothorax, right ribs 3-6, 10-11 fractures. 3/17 rt chest tube   Clinical Impression   Pt lives with his daughter and 2 grandchildren and has 24 hour care. He walks with a RW when first getting up in the morning, otherwise uses a cane. Pt is modified independent in ADL, but reports it is "the worst part of his day," as it is very fatiguing. Pt presents with decreased activity tolerance and pain. Educated in use of pillow to splint R rib area and importance of EOB/OOB activity to prevent medical complications. Pt sat EOB x 10 minutes this visit and reports nausea. Per daughter, may be due to pain medications and empty stomach. Pt likely to progress well and be able to return home with his supportive family. Will follow acutely.    Follow Up Recommendations  Home health OT;Supervision/Assistance - 24 hour    Equipment Recommendations  None recommended by OT    Recommendations for Other Services       Precautions / Restrictions Precautions Precautions: Fall Precaution Comments: R chest tube, epidural Restrictions Weight Bearing Restrictions: No      Mobility Bed Mobility Overal bed mobility: Needs Assistance Bed Mobility: Supine to Sit;Sit to Supine     Supine to sit: +2 for physical assistance;Total assist;HOB elevated Sit to supine: +2 for physical assistance;Total assist;HOB elevated   General bed mobility comments: elevated HOB maximally, guided LEs over EOB toward R side, pivoted hips  with bed pad and assisted trunk, assisted for all aspects to return to supine  Transfers                 General transfer comment: deferred due to nausea, pain and hypotension    Balance Overall balance assessment: Needs assistance Sitting-balance support: Feet supported Sitting balance-Leahy Scale: Fair Sitting balance - Comments: statically x 10 minutes                                   ADL either performed or assessed with clinical judgement   ADL Overall ADL's : Needs assistance/impaired Eating/Feeding: Independent   Grooming: Supervision/safety;Set up;Sitting   Upper Body Bathing: Moderate assistance;Sitting   Lower Body Bathing: Total assistance;Bed level   Upper Body Dressing : Minimal assistance;Sitting   Lower Body Dressing: Bed level;Total assistance                 General ADL Comments: Pt with nausea and hypotension at EOB, returned to supine.     Vision Patient Visual Report: No change from baseline       Perception     Praxis      Pertinent Vitals/Pain Pain Assessment: Faces Faces Pain Scale: Hurts even more Pain Location: R chest tube site/ribs Pain Descriptors / Indicators: Guarding;Grimacing;Discomfort Pain Intervention(s): Monitored during session;Premedicated before session;Repositioned     Hand Dominance Right   Extremity/Trunk Assessment Upper Extremity Assessment Upper Extremity Assessment: Overall WFL for tasks assessed   Lower Extremity Assessment Lower Extremity Assessment: Defer to PT  evaluation   Cervical / Trunk Assessment Cervical / Trunk Assessment: Other exceptions Cervical / Trunk Exceptions: painful ribs, chest tube   Communication Communication Communication: HOH(hears best out of L ear)   Cognition Arousal/Alertness: Awake/alert Behavior During Therapy: WFL for tasks assessed/performed Overall Cognitive Status: Within Functional Limits for tasks assessed                                  General Comments: per daughter, pt has some short term memory deficits at baseline   General Comments       Exercises     Shoulder Instructions      Home Living Family/patient expects to be discharged to:: Private residence Living Arrangements: Children;Other relatives Available Help at Discharge: Available 24 hours/day Type of Home: House Home Access: Ramped entrance     Home Layout: Multi-level;Able to live on main level with bedroom/bathroom Alternate Level Stairs-Number of Steps: (reports he is not allowed to go up/down steps; )   Bathroom Shower/Tub: Occupational psychologist: Handicapped height Bathroom Accessibility: Yes   Home Equipment: Belva - single point;Shower seat;Walker - 2 wheels;Grab bars - tub/shower;Hand held shower head          Prior Functioning/Environment Level of Independence: Needs assistance  Gait / Transfers Assistance Needed: walks with cane (he prefers) or RW (uses first thing in the morning) ADL's / Homemaking Assistance Needed: gets SOB with bathing and dressing; assisted for IADL   Comments: daughter reports he falls infrequently         OT Problem List: Decreased strength;Decreased activity tolerance;Impaired balance (sitting and/or standing);Decreased knowledge of use of DME or AE;Pain      OT Treatment/Interventions: Self-care/ADL training;DME and/or AE instruction;Therapeutic activities;Patient/family education;Balance training    OT Goals(Current goals can be found in the care plan section) Acute Rehab OT Goals Patient Stated Goal: to return home OT Goal Formulation: With patient Time For Goal Achievement: 10/16/19 Potential to Achieve Goals: Good ADL Goals Pt Will Perform Grooming: with supervision;standing Pt Will Perform Upper Body Dressing: with set-up;with supervision;sitting Pt Will Perform Lower Body Dressing: with min assist;with adaptive equipment;sit to/from stand Pt Will Transfer to Toilet: with  supervision;ambulating Pt Will Perform Toileting - Clothing Manipulation and hygiene: with supervision;sit to/from stand Additional ADL Goal #1: Pt will perform bed mobility with moderate assistance in preparation for ADL.  OT Frequency: Min 2X/week   Barriers to D/C:            Co-evaluation PT/OT/SLP Co-Evaluation/Treatment: Yes Reason for Co-Treatment: For patient/therapist safety   OT goals addressed during session: Strengthening/ROM;ADL's and self-care      AM-PAC OT "6 Clicks" Daily Activity     Outcome Measure Help from another person eating meals?: None Help from another person taking care of personal grooming?: A Little Help from another person toileting, which includes using toliet, bedpan, or urinal?: A Lot Help from another person bathing (including washing, rinsing, drying)?: A Lot Help from another person to put on and taking off regular upper body clothing?: A Little Help from another person to put on and taking off regular lower body clothing?: Total 6 Click Score: 15   End of Session Equipment Utilized During Treatment: Oxygen(3L) Nurse Communication: Other (comment)(pt needs thickener)  Activity Tolerance: Treatment limited secondary to medical complications (Comment);Patient limited by pain Patient left: in bed;with call bell/phone within reach;with bed alarm set;with family/visitor present  OT Visit Diagnosis: Pain;Muscle weakness (  generalized) (M62.81)                [id number] OT Time Calculation (min): 39 min Charges:  OT General Charges $OT Visit: 1 Visit OT Evaluation $OT Eval Moderate Complexity: [place], OTR/L Acute Rehabilitation Services Pager: [phone] Office: [phone] Malka So 10/02/2019, 2:44 PM

## 2019-10-02 NOTE — Anesthesia Post-op Follow-up Note (Signed)
  Anesthesia Pain Follow-up Note  Patient: Micheal Frank  Day #: 2  Date of Follow-up: 10/02/2019 Time: 6:05 PM  Last Vitals:  Vitals:   10/02/19 1158 10/02/19 1512  BP: 110/69 (!) 109/56  Pulse: 68 63  Resp: 17 18  Temp: 36.8 C 37.1 C  SpO2: 92% 94%    Level of Consciousness: alert  Pain: mild when moves, no pain otherwise  Side Effects:None  Catheter Site Exam:clean, dry, no drainage  Epidural / Intrathecal (From admission, onward)   Start     Dose/Rate Route Frequency Ordered Stop   10/01/19 1845  ropivacaine (PF) 2 mg/mL (0.2%) (NAROPIN) injection     8 mL/hr 8 mL/hr  Epidural Continuous 10/01/19 1835         Plan: Continue current therapy of postop epidural at surgeon's request  Arlon Bleier,E. Adriane Guglielmo

## 2019-10-02 NOTE — TOC Initial Note (Signed)
Transition of Care [facility]) - Initial/Assessment Note    Patient Details  Name: Micheal Frank MRN: [id number] Date of Birth: Mar 26, 1926  Transition of [facility]) CM/SW Contact:    Ella Bodo, RN Phone Number: 10/02/2019, 3:41 PM  Clinical Narrative: 86 year old male who arrives with a history of chronic kidney disease, COPD, history of brain bleed, h/o epidural abscess, blood clots, memory loss, recent pneumonia, who fell at home. Daughter states that he was likely trying to close a curtain, fell in between some chairs, landing on his right side onto some chairs. CT scan--right hemopneumothorax, right ribs 3-6, 10-11 fractures. 3/17 rt chest tube.  PTA, pt needs assistance with ADLS, but daughter states he is mostly independent.  He lives at home with one of his daughters and her children, and has 24h care.  OT recommending [place] follow up; daughter states they have used [facility] in the past and would like to use again.  Will follow for PT recommendations and orders; daughter states home is completely handicapped accessible, and pt has all needed DME at home.    SBIRT completed; no ETOH use or cessation resources needed.                      Expected Discharge Plan: [place] Barriers to Discharge: Continued Medical Work up   Patient Goals and CMS Choice   CMS Medicare.gov Compare Post Acute Care list provided to:: Patient Represenative (must comment)(daughter) Choice offered to / list presented to : Adult Children  Expected Discharge Plan and Services Expected Discharge Plan: [place]   Discharge Planning Services: CM Consult Post Acute Care Choice: Home Health Living arrangements for the past 2 months: Single Family Home                                      Prior Living Arrangements/Services Living arrangements for the past 2 months: Single Family Home Lives with:: Adult Children, Relatives Patient language and need for  interpreter reviewed:: Yes Do you feel safe going back to the place where you live?: Yes      Need for Family Participation in Patient Care: Yes (Comment) Care giver support system in place?: Yes (comment) Current home services: DME Criminal Activity/Legal Involvement Pertinent to Current Situation/Hospitalization: No - Comment as needed  Activities of Daily Living      Permission Sought/Granted                  Emotional Assessment Appearance:: Appears stated age Attitude/Demeanor/Rapport: Engaged Affect (typically observed): Accepting Orientation: : Oriented to Self, Oriented to Place, Oriented to  Time, Oriented to Situation Alcohol / Substance Use: Not Applicable Psych Involvement: No (comment)  Admission diagnosis:  Pneumothorax on right [J93.9] Traumatic hemopneumothorax, initial encounter [S27.2XXA] Closed fracture of multiple ribs of right side, initial encounter [S22.41XA] Patient Active Problem List   Diagnosis Date Noted  . Pneumothorax on right 10/01/2019  . Annual physical exam 03/12/2019  . Multiple rib fractures 01/17/2018  . Essential hypertension 01/16/2018  . BPH (benign prostatic hyperplasia) 01/16/2018  . Compression fracture of T12 vertebra ([place]) 06/24/2017  . Generalized weakness 06/24/2017  . Presence of left artificial knee joint 07/24/2016  . Chronic pain of left knee 07/24/2016  . Chronic midline low back pain without sciatica 07/24/2016  . Anxiety and depression 07/09/2016  . C. difficile diarrhea  05/04/2016  . Chronic cough 08/16/2015  . PCP NOTES >>>>>> 04/06/2015  . CTS (carpal tunnel syndrome) 11/16/2014  . Hyperlipidemia 08/17/2014  . Superficial bruising of chest wall 07/29/2014  . Urgency incontinence 01/23/2013  . Chronic kidney disease, stage III (moderate) ([place]) 01/20/2013  . At high risk for falls 11/28/2012  . ALLERGIC RHINITIS 12/09/2009  . Carcinoma in situ of skin 04/30/2008  . RESTLESS LEG SYNDROME 04/30/2008  .  NEUROPATHY 04/30/2008  . GLAUCOMA, BORDERLINE 04/30/2008  . Meniere's disease 04/30/2008  . HEARING LOSS 04/30/2008  . COPD GOLD II  04/30/2008  . DJD (degenerative joint disease) 04/30/2008  . Osteoporosis 04/30/2008  . Sleep apnea 04/30/2008  . HISTORY OF ASBESTOS EXPOSURE 04/30/2008   PCP:  Colon Branch, MD Pharmacy:   [facility], [address] [place] [address] Phone: [phone] Fax: [phone]     Social Determinants of Health (SDOH) Interventions    Readmission Risk Interventions No flowsheet data found.  Reinaldo Raddle, RN, BSN  Trauma/Neuro ICU Case Manager [phone]

## 2019-10-03 ENCOUNTER — Inpatient Hospital Stay (HOSPITAL_COMMUNITY): Payer: Medicare Other

## 2019-10-03 LAB — CBC
HCT: 35.1 % — ABNORMAL LOW (ref 39.0–52.0)
Hemoglobin: 11 g/dL — ABNORMAL LOW (ref 13.0–17.0)
MCH: 32 pg (ref 26.0–34.0)
MCHC: 31.3 g/dL (ref 30.0–36.0)
MCV: 102 fL — ABNORMAL HIGH (ref 80.0–100.0)
Platelets: 186 10*3/uL (ref 150–400)
RBC: 3.44 MIL/uL — ABNORMAL LOW (ref 4.22–5.81)
RDW: 14.7 % (ref 11.5–15.5)
WBC: 13.2 10*3/uL — ABNORMAL HIGH (ref 4.0–10.5)
nRBC: 0 % (ref 0.0–0.2)

## 2019-10-03 LAB — BASIC METABOLIC PANEL
Anion gap: 9 (ref 5–15)
BUN: 24 mg/dL — ABNORMAL HIGH (ref 8–23)
CO2: 23 mmol/L (ref 22–32)
Calcium: 7.7 mg/dL — ABNORMAL LOW (ref 8.9–10.3)
Chloride: 111 mmol/L (ref 98–111)
Creatinine, Ser: 1.84 mg/dL — ABNORMAL HIGH (ref 0.61–1.24)
GFR calc Af Amer: 36 mL/min — ABNORMAL LOW (ref >60)
GFR calc non Af Amer: 31 mL/min — ABNORMAL LOW (ref >60)
Glucose, Bld: 75 mg/dL (ref 70–99)
Potassium: 4.4 mmol/L (ref 3.5–5.1)
Sodium: 143 mmol/L (ref 135–145)

## 2019-10-03 MED ORDER — SODIUM CHLORIDE 0.9 % IV SOLN: INTRAVENOUS | Status: DC

## 2019-10-03 MED ORDER — GUAIFENESIN 100 MG/5ML PO SOLN
10.0000 mL | Freq: Four times a day (QID) | ORAL | Status: DC
  Administered 2019-10-03 – 2019-10-12 (×33): 200 mg via ORAL
  Filled 2019-10-03 (×17): qty 10
  Filled 2019-10-03: qty 100
  Filled 2019-10-03 (×12): qty 10

## 2019-10-03 MED ORDER — SODIUM CHLORIDE 0.9 % IV BOLUS
500.0000 mL | Freq: Once | INTRAVENOUS | Status: AC
  Administered 2019-10-03: 500 mL via INTRAVENOUS

## 2019-10-03 MED ORDER — BOOST PLUS PO LIQD
237.0000 mL | Freq: Three times a day (TID) | ORAL | Status: DC
  Administered 2019-10-03 – 2019-10-12 (×21): 237 mL via ORAL
  Filled 2019-10-03 (×30): qty 237

## 2019-10-03 MED ORDER — HEPARIN SODIUM (PORCINE) 5000 UNIT/ML IJ SOLN
5000.0000 [IU] | Freq: Three times a day (TID) | INTRAMUSCULAR | Status: DC
  Filled 2019-10-03: qty 1

## 2019-10-03 MED ORDER — GUAIFENESIN 200 MG PO TABS: 200.0000 mg | ORAL_TABLET | Freq: Four times a day (QID) | ORAL | Status: DC

## 2019-10-03 NOTE — Progress Notes (Signed)
Heparin subq ordered, per patient daughter he is not allowed to be on any blood thinners due to hx of brain bleed. anticoagulant compound also noted in his allergies. Informed MD, heparin injection not given.

## 2019-10-03 NOTE — Evaluation (Signed)
Clinical/Bedside Swallow Evaluation Patient Details  Name: Micheal Frank MRN: [id number] Date of Birth: [birth year]  Today's Date: 10/03/2019 Time: SLP Start Time (ACUTE ONLY): [id number] SLP Stop Time (ACUTE ONLY): 0943 SLP Time Calculation (min) (ACUTE ONLY): 26 min  Past Medical History:  Past Medical History:  Diagnosis Date  . Arthritis    left leg also has swelling  . Asthma   . Blood dyscrasia    pt CAN NOT have any blood thinners d/t hx brain bleed  . Chronic kidney disease, stage III (moderate) 01/20/2013  . Complication of anesthesia    confusion with anesthesia  . Constipation    miralax prn  . COPD (chronic obstructive pulmonary disease) (H[facility])    emphysema  . Depression    takes wellbutrin bid  . Epidural abscess   . Gastric ulcer   . History of blood clots   . Hyperlipidemia   . Melanoma ([place])   . Peripheral neuropathy   . Pneumonia    as a child and in 1952;had a pneumonia vaccine couple of years ago  . Prosthetic joint infection ([place])   . S/P right heart catheterization    at least 57yrs  . Shingles   . Short-term memory loss   . Shortness of breath    with exertion  . Sleep apnea    has CPAP but doesn't use it;sleep study done at least 72yrs ago  . Staphylococcus aureus bacteremia with sepsis ([place])   . Subdural hematoma ([place]) 03/30/11  . Urinary frequency    wears depends   Past Surgical History:  Past Surgical History:  Procedure Laterality Date  . epidural abscess drainage  12/2010  . 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 EYE SURGERY     bil cataract surgery  . FILTERING PROCEDURE     filter placed in neck d/t blood  clot  . HERNIA REPAIR     25+yrs ago  . KNEE ARTHROSCOPY    . SKIN BIOPSY     melanoma on head   . TOTAL KNEE ARTHROPLASTY     x 2 left knee 2003/2012  . TOTAL KNEE REVISION  05/23/2011   Procedure: TOTAL KNEE REVISION;  Surgeon: Meredith Pel;  Location: [place];  Service: Orthopedics;  Laterality:  Left;  LEFT KNEE REMOVAL OF SPACER, POSSIBLE REIMPLANTATION OF REVISION TKA VERSES REPLACEMENT ANTIBIOTIC SPACER   HPI:  86 year old male who arrives after a fall at home. CT scan--right hemopneumothorax, right ribs 3-6, 10-11 fractures. 3/17 rt chest tube. Pt had MBS in 2018 that revealed a CP bar with mild residuals above and backflow but with no aspiration/penetration.. Regular solids and thin liquids were recommended at that time with reinforcement of esophageal precautions. PMH includes: CKD, COPD, h/o brain bleed, h/o epidural abscess, blood clots, memory loss, recent PNA, GERD.   Assessment / Plan / Recommendation Clinical Impression  Pt with a h/o mild dysphagia secondary to CP bar/suspected esophageal dysphagia presents with globus sensation over the last few days. He has frequent coughing throughout PO trials regardless of consistency. Although his daughter describes this as a baseline cough, it definitely appears to increase in frequency during PO trials while I am in the room, and his cough is also guarded secondary to rib pain. Pt also has subjective c/o boluses "not going down" that appears to be the most subsided with small sips of nectar thick liquids. Clinical presentation is concerning for an increase in  dysphagia with potential for increased aspiration risk in light of coughing and recent PNA.   Discussed this with pt and daughter, who has already started ordering him softer/pureed foods off the menu and is concerned that he has not been drinking over the last few days since he was placed on nectar thick liquids. MBS was presented as a way to more thoroughly assess oropharyngeal swallow and help guide recommendations for a PO diet, but the pt is clear that he does not further testing and his daughter supports him in this decision. They acknowledge that without instrumental testing, they will be accepting risk for aspiration.   They choose to accept this risk and therefore they would  also like for his diet to be liberalized to regular solids and thin liquids so that he may ask for any POs that he would like to try in order to try to increase his intake. SLP reviewed the importance of oral care and the use of aspiration/esophageal precautions to reduce but not eliminate aspiration risk. Will f/u briefly while inpatient to provide further education and training. MD made aware of request and has already adjusted the diet orders accordingly.   SLP Visit Diagnosis: Dysphagia, pharyngoesophageal phase (R13.14)    Aspiration Risk  Mild aspiration risk;Moderate aspiration risk    Diet Recommendation Regular;Thin liquid(accepting risk of aspiration)   Liquid Administration via: Straw;Cup Medication Administration: Whole meds with puree Supervision: Staff to assist with self feeding;Full supervision/cueing for compensatory strategies Compensations: Slow rate;Small sips/bites;Follow solids with liquid Postural Changes: Seated upright at 90 degrees;Remain upright for at least 30 minutes after po intake    Other  Recommendations Oral Care Recommendations: Oral care BID   Follow up Recommendations 24 hour supervision/assistance      Frequency and Duration min 1 x/week  1 week       Prognosis        Swallow Study   General HPI: 86 year old male who arrives after a fall at home. CT scan--right hemopneumothorax, right ribs 3-6, 10-11 fractures. 3/17 rt chest tube. Pt had MBS in 2018 that revealed a CP bar with mild residuals above and backflow but with no aspiration/penetration.. Regular solids and thin liquids were recommended at that time with reinforcement of esophageal precautions. PMH includes: CKD, COPD, h/o brain bleed, h/o epidural abscess, blood clots, memory loss, recent PNA, GERD. Type of Study: Bedside Swallow Evaluation Previous Swallow Assessment: see HPI Diet Prior to this Study: Dysphagia 3 (soft);Nectar-thick liquids Temperature Spikes Noted: No Respiratory  Status: Nasal cannula History of Recent Intubation: No Behavior/Cognition: Alert;Cooperative;Pleasant mood;Other (Comment)(HOH) Oral Cavity Assessment: Dry Oral Care Completed by SLP: No Oral Cavity - Dentition: Dentures, top;Dentures, not available(bottom dentures not present) Vision: Functional for self-feeding Self-Feeding Abilities: Needs assist Patient Positioning: Upright in bed Baseline Vocal Quality: Normal Volitional Cough: Strong;Other (Comment)(guarded) Volitional Swallow: Able to elicit    Oral/Motor/Sensory Function Overall Oral Motor/Sensory Function: Within functional limits   Ice Chips Ice chips: Impaired Presentation: Spoon Pharyngeal Phase Impairments: Cough - Immediate   Thin Liquid Thin Liquid: Impaired Presentation: Cup;Straw Pharyngeal  Phase Impairments: Cough - Immediate;Cough - Delayed    Nectar Thick Nectar Thick Liquid: Impaired Presentation: Straw Pharyngeal Phase Impairments: Cough - Immediate;Cough - Delayed   Honey Thick Honey Thick Liquid: Not tested   Puree Puree: Impaired Presentation: Spoon Pharyngeal Phase Impairments: Cough - Immediate;Cough - Delayed   Solid     Solid: Impaired Oral Phase Functional Implications: Oral residue Pharyngeal Phase Impairments: Cough - Immediate;Cough - Delayed  Osie Bond., M.A. [place] Pager [phone] Office [phone]  10/03/2019,10:50 AM

## 2019-10-03 NOTE — Progress Notes (Signed)
Physical Therapy Treatment Patient Details Name: Micheal Frank MRN: [id number] DOB: December 20, 1925 Today's Date: 10/03/2019    History of Present Illness 86 year old male who arrives with a history of chronic kidney disease, COPD, history of brain bleed, h/o epidural abscess, blood clots, memory loss, recent pneumonia, who fell at home. Daughter states that he was likely trying to close a curtain, fell in between some chairs, landing on his right side onto some chairs. CT scan--right hemopneumothorax, right ribs 3-6, 10-11 fractures. 3/17 rt chest tube    PT Comments    Mobility much improved today as compared to eval. Pt required +2 mod assist bed mobility, +2 mod assist sit to stand with RW, and +2 mod assist ambulation 3' with RW bed to recliner. Pt lethargic, having to wake him in bed to participate then pt immediately asleep once positioned in recliner. Chest tube remained on suction during mobility/session. Pt in recliner with feet elevated at end of session. Daughter present in room.    Follow Up Recommendations  Home health PT;Supervision/Assistance - 24 hour     Equipment Recommendations  None recommended by PT    Recommendations for Other Services       Precautions / Restrictions Precautions Precautions: Fall;Other (comment) Precaution Comments: R chest tube, epidural Restrictions Weight Bearing Restrictions: No    Mobility  Bed Mobility Overal bed mobility: Needs Assistance Bed Mobility: Supine to Sit     Supine to sit: HOB elevated;+2 for physical assistance;Mod assist     General bed mobility comments: increased time, cues for sequencing, use of bed pad to scoot to EOB  Transfers Overall transfer level: Needs assistance Equipment used: Rolling walker (2 wheeled) Transfers: Sit to/from Stand Sit to Stand: +2 physical assistance;+2 safety/equipment;Mod assist         General transfer comment: cues for hand placement, assist to power  up  Ambulation/Gait Ambulation/Gait assistance: +2 physical assistance;+2 safety/equipment;Min assist;Mod assist Gait Distance (Feet): 3 Feet Assistive device: Rolling walker (2 wheeled) Gait Pattern/deviations: Step-through pattern;Decreased stride length;Shuffle Gait velocity: decreased   General Gait Details: shuffle gait bed to recliner, cues for sequencing, assist to maintain balance and manage RW   Stairs             Wheelchair Mobility    Modified Rankin (Stroke Patients Only)       Balance Overall balance assessment: Needs assistance Sitting-balance support: Feet supported;Bilateral upper extremity supported Sitting balance-Leahy Scale: Fair     Standing balance support: Bilateral upper extremity supported;During functional activity Standing balance-Leahy Scale: Poor Standing balance comment: reliant on external support                            Cognition Arousal/Alertness: Lethargic;Suspect due to medications Behavior During Therapy: [facility] for tasks assessed/performed Overall Cognitive Status: Within Functional Limits for tasks assessed                                 General Comments: Daughter present in room during session.      Exercises      General Comments General comments (skin integrity, edema, etc.): Pt on 2L O2 via [place]. VSS      Pertinent Vitals/Pain Pain Assessment: Faces Faces Pain Scale: Hurts little more Pain Location: R flank Pain Descriptors / Indicators: Guarding;Grimacing;Discomfort Pain Intervention(s): Monitored during session;Limited activity within patient's tolerance;Repositioned    Home Living  Prior Function            PT Goals (current goals can now be found in the care plan section) Acute Rehab PT Goals Patient Stated Goal: to return home Progress towards PT goals: Progressing toward goals    Frequency    Min 5X/week      PT Plan Current plan remains  appropriate    Co-evaluation              AM-PAC PT "6 Clicks" Mobility   Outcome Measure  Help needed turning from your back to your side while in a flat bed without using bedrails?: A Lot Help needed moving from lying on your back to sitting on the side of a flat bed without using bedrails?: A Lot Help needed moving to and from a bed to a chair (including a wheelchair)?: A Lot Help needed standing up from a chair using your arms (e.g., wheelchair or bedside chair)?: A Little Help needed to walk in hospital room?: A Lot Help needed climbing 3-5 steps with a railing? : Total 6 Click Score: 12    End of Session Equipment Utilized During Treatment: Gait belt;Oxygen Activity Tolerance: Patient tolerated treatment well Patient left: in chair;with call bell/phone within reach;with chair alarm set;with family/visitor present Nurse Communication: Mobility status PT Visit Diagnosis: Muscle weakness (generalized) (M62.81);History of falling (Z91.81);Difficulty in walking, not elsewhere classified (R26.2)     Time: [id number] PT Time Calculation (min) (ACUTE ONLY): 25 min  Charges:  $Gait Training: 8-22 mins $Therapeutic Activity: 8-22 mins                     Lorrin Goodell, PT  Office # [phone] Pager [phone]    Lorriane Shire 10/03/2019, 12:35 PM

## 2019-10-03 NOTE — Progress Notes (Addendum)
Patient refused nighttime meds. While RN was scanning meds, patient stated, "get out - I just want to sleep!" When RN encouraged him to take his meds twice, the patient firmly stated both times, "I'm not taking them." RN discarded pills into sharps container. BP was 100s/40s, so Cardura would have been held anyway. Patient now sleeping.   0217: Patient was saturating on 4L [place] in the mid 80s, so he was switched to a venturi mask and bumped up to 8L after 4L on the venturi mas proved ineffective. Additionally,the RN noted that the patient had red phlegm that he had coughed up on his gown. MD was notified of this. Furthermore, the patient's upper abdomen felt distended when palpated and tenderness was noted. Patient now sleeping in high fowler's.   0333: The patient's abdomen is more firm in the upper left quadrant with tenderness upon palpation. RN notified MD who ordered a flat and upright 2 view abdominal x-ray.

## 2019-10-03 NOTE — Progress Notes (Signed)
Spoke with Trauma MD for order clarification: CT suction is to be at -40. MD also want pt to walk and be OOB. 2 RNs attempted to ambulate patient. Pt was able to get out of bed with min-mod assist. Pt took 4 steps forward and said he is sleepy and do not want to walk. Patient took 4 steps backwards with contact guard assist. Pt's gait was steady. He was assisted back to bed.

## 2019-10-03 NOTE — Anesthesia Post-op Follow-up Note (Signed)
  Anesthesia Pain Follow-up Note  Patient: Micheal Frank  Day #: 3  Date of Follow-up: 10/03/2019 Time: 12:25 PM  Last Vitals:  Vitals:   10/03/19 0751 10/03/19 1110  BP: (!) 142/63 (!) 121/54  Pulse: 76 65  Resp: 17 18  Temp: 36.9 C 36.4 C  SpO2: 94% 99%    Level of Consciousness: lethargic  Pain: none   Side Effects:None  Catheter Site Exam:clean  Anti-Coag Meds (From admission, onward)   Start     Dose/Rate Route Frequency Ordered Stop   10/03/19 1400  heparin injection 5,000 Units     5,000 Units Subcutaneous Every 8 hours 10/03/19 1143      Epidural / Intrathecal (From admission, onward)   Start     Dose/Rate Route Frequency Ordered Stop   10/01/19 1845  ropivacaine (PF) 2 mg/mL (0.2%) (NAROPIN) injection     8 mL/hr 8 mL/hr  Epidural Continuous 10/01/19 1835         Plan: Continue current therapy of postop epidural at surgeon's request  Zeplin Aleshire P Xavia Kniskern

## 2019-10-03 NOTE — Progress Notes (Signed)
Patient ID: Micheal Frank, male   DOB: [birth year], 86 y.o.   MRN: [id number]       Subjective: Patient up in his chair after getting his bath. He is worn out.  Also got pain meds about 0900am.  Not eating or drinking much.  Daughter present this morning.  Most of his pain is at his chest tube site.    ROS: unable, he is very sleepy  Objective: Vital signs in last 24 hours: Temp:  [97.6 F (36.4 C)-99.6 F (37.6 C)] 97.6 F (36.4 C) (03/19 1110) Pulse Rate:  [63-76] 65 (03/19 1110) Resp:  [17-20] 18 (03/19 1110) BP: (95-142)/(51-69) 121/54 (03/19 1110) SpO2:  [92 %-99 %] 99 % (03/19 1110) Last BM Date: (PTA)  Intake/Output from previous day: 03/18 0701 - 03/19 0700 In: 1679.5 [P.O.:300; I.V.:1220.2] Out: 61 [Urine:450; Chest Tube:440] Intake/Output this shift: Total I/O In: -  Out: 400 [Urine:400]  PE: Gen: elderly frail man in NAD HEENT: PERRL, HOH Heart: regular Lungs: good BS bilaterally.  Right chest tube in place with serosang output in cannister.  No airleak noted.  Tube insertion site is clean Abd: soft, NT, ND GU: foley in place with yellow urine Ext: MAE Psych: sleepy today, but does wake up to questions and answer appropriately.  Lab Results:  Recent Labs    10/02/19 0516 10/03/19 0707  WBC 13.3* 13.2*  HGB 11.7* 11.0*  HCT 37.1* 35.1*  PLT 192 186   BMET Recent Labs    10/02/19 0516 10/03/19 0707  NA 142 143  K 4.3 4.4  CL 112* 111  CO2 21* 23  GLUCOSE 99 75  BUN 20 24*  CREATININE 1.62* 1.84*  CALCIUM 7.6* 7.7*   PT/INR Recent Labs    10/01/19 1416  LABPROT 14.3  INR 1.1   CMP     Component Value Date/Time   NA 143 10/03/2019 0707   K 4.4 10/03/2019 0707   CL 111 10/03/2019 0707   CO2 23 10/03/2019 0707   GLUCOSE 75 10/03/2019 0707   BUN 24 (H) 10/03/2019 0707   CREATININE 1.84 (H) 10/03/2019 0707   CREATININE 1.27 07/24/2011 1133   CALCIUM 7.7 (L) 10/03/2019 0707   PROT 6.6 10/16/2018 1200   ALBUMIN 3.6 10/16/2018  1200   AST 21 10/16/2018 1200   ALT 9 10/16/2018 1200   ALKPHOS 71 10/16/2018 1200   BILITOT 0.8 10/16/2018 1200   GFRNONAA 31 (L) 10/03/2019 0707   GFRAA 36 (L) 10/03/2019 0707   Lipase     Component Value Date/Time   LIPASE 20 07/23/2017 1145       Studies/Results: DG Chest 2 View  Result Date: 10/02/2019 CLINICAL DATA:  Pneumothorax EXAM: CHEST - 2 VIEW COMPARISON:  October 01, 2019 FINDINGS: There is a chest tube on the right which is unchanged in position with the tip directed inferiorly and medially on the right. Right apical and apicolateral pneumothorax is unchanged in size. No tension component. There is extensive subcutaneous air throughout the right hemithorax. There is bibasilar atelectasis. No airspace consolidation. Heart is borderline enlarged with pulmonary vascularity normal. No adenopathy. There is aortic atherosclerosis. Bones are osteoporotic. IMPRESSION: Stable pneumothorax on the right. Chest tube position unchanged. Extensive subcutaneous air on the right. Bibasilar atelectasis. No consolidation. Stable cardiac silhouette. Aortic Atherosclerosis (ICD10-I70.0). Electronically Signed   By: Lowella Grip III M.D.   On: 10/02/2019 08:46   DG CHEST PORT 1 VIEW  Result Date: 10/03/2019 CLINICAL DATA:  Pneumothorax and chest  tube EXAM: PORTABLE CHEST 1 VIEW COMPARISON:  Yesterday FINDINGS: Right chest tube in stable position. Extensive chest wall emphysema without evident progression. No visible pneumothorax. Reticulation in the bilateral lower lungs. Normal heart size and mediastinal contours. IMPRESSION: No visible pneumothorax today. Extensive right chest wall emphysema persists. Electronically Signed   By: Monte Fantasia M.D.   On: 10/03/2019 07:18   DG CHEST PORT 1 VIEW  Result Date: 10/01/2019 CLINICAL DATA:  Status post chest tube placement. EXAM: PORTABLE CHEST 1 VIEW COMPARISON:  Earlier this day. FINDINGS: Right pigtail catheter is been removed. There is a new  right chest tube with tip directed towards the lung base. Small pneumothorax extending from the apex to the base laterally. Extensive subcutaneous emphysema about the right chest wall. Right lower rib fractures again seen. Increasing atelectasis at the bases. Unchanged heart size and mediastinal contours. No significant pleural fluid IMPRESSION: Removal of right pigtail catheter with new right chest tube, tip directed towards the base. Small right pneumothorax. Extensive subcutaneous emphysema about the right chest wall. Aortic Atherosclerosis (ICD10-I70.0). Electronically Signed   By: Keith Rake M.D.   On: 10/01/2019 13:33   DG CHEST PORT 1 VIEW  Result Date: 10/01/2019 CLINICAL DATA:  Chest tube placement. EXAM: PORTABLE CHEST 1 VIEW COMPARISON:  10/01/2019 FINDINGS: New right-sided chest tube does not enter the right hemithorax. It lies in the subcutaneous soft tissues of the lateral mid chest surrounded by subcutaneous emphysema. The previously seen chest tube has been removed. Moderate right pneumothorax appears slightly larger than on the earlier exam. Extensive right-sided subcutaneous emphysema is stable. IMPRESSION: 1. Misplaced right-sided chest tube, lying in the subcutaneous soft tissues the right lateral chest, external to the thoracic cavity. Critical Value/emergent results were called by telephone at the time of interpretation on 10/01/2019 at 12:48 pm to the patient's nurse, Luetta Nutting, who verbally acknowledged these results. 2. Slight increase in the size of a moderate right pneumothorax. Electronically Signed   By: Lajean Manes M.D.   On: 10/01/2019 12:49   DG CHEST PORT 1 VIEW  Result Date: 10/01/2019 CLINICAL DATA:  Right-sided pneumothorax. Check wire position for chest tube placement EXAM: PORTABLE CHEST 1 VIEW COMPARISON:  September 30, 2019 FINDINGS: Pneumothorax again noted on the right, increased in size from 1 day prior. Wire is present on the right overlying the medial upper  hemithorax. There is extensive subcutaneous air on the right, a finding also present previously. Atelectatic changes noted in the left base. Ill-defined opacity in the right mid lung is likely due to atelectasis secondary to increase in pneumothorax size on the right. Heart size and pulmonary vascular normal. No adenopathy. There is aortic atherosclerosis. Bones are osteoporotic. IMPRESSION: Pneumothorax is increased in size compared to 1 day prior. Wire is present at the level of the medial upper thorax at the superior hilar level. Atelectasis left base. Ill-defined opacity right mid lung likely of atelectatic etiology. Stable cardiac silhouette.  Aortic Atherosclerosis (ICD10-I70.0). Electronically Signed   By: Lowella Grip III M.D.   On: 10/01/2019 12:25    Anti-infectives: Anti-infectives (From admission, onward)   None       Assessment/Plan 86 year old male status post fall Right 3-6, 10-11 rib fractures - anesthesia placed epidural to help with pain control.  Continue today.  Defer to them on dosing and ability to increase or stay the same given some low BPs.  Albumin given and on LR. Right hemopneumothorax - CXR today with no PTX.  Decrease suction to -20  Chronic kidney disease/urinary retention - cr up to 1.8 today.  Will increase IVFs and give a 500cc bolus.  Recheck BMET in am.  Continue foley today History of COPD- continue home nebulizers and medications History of multiple basal cell carcinomas, melanoma RLS - cont requip FEN - dysphagia 3 diet, IVFs VTE - heparin ID - none currently   LOS: 2 days    Henreitta Cea , [facility] 10/03/2019, 11:28 AM Please see Amion for pager number during day hours 7:00am-4:30pm or 7:00am -11:30am on weekends

## 2019-10-04 ENCOUNTER — Inpatient Hospital Stay (HOSPITAL_COMMUNITY): Payer: Medicare Other

## 2019-10-04 LAB — BASIC METABOLIC PANEL
Anion gap: 8 (ref 5–15)
BUN: 21 mg/dL (ref 8–23)
CO2: 22 mmol/L (ref 22–32)
Calcium: 7.6 mg/dL — ABNORMAL LOW (ref 8.9–10.3)
Chloride: 110 mmol/L (ref 98–111)
Creatinine, Ser: 1.54 mg/dL — ABNORMAL HIGH (ref 0.61–1.24)
GFR calc Af Amer: 45 mL/min — ABNORMAL LOW (ref >60)
GFR calc non Af Amer: 39 mL/min — ABNORMAL LOW (ref >60)
Glucose, Bld: 89 mg/dL (ref 70–99)
Potassium: 4.1 mmol/L (ref 3.5–5.1)
Sodium: 140 mmol/L (ref 135–145)

## 2019-10-04 MED ORDER — POLYETHYLENE GLYCOL 3350 17 G PO PACK
17.0000 g | PACK | Freq: Every day | ORAL | Status: DC
  Administered 2019-10-04 – 2019-10-11 (×6): 17 g via ORAL
  Filled 2019-10-04 (×7): qty 1

## 2019-10-04 MED ORDER — BISACODYL 10 MG RE SUPP
10.0000 mg | Freq: Once | RECTAL | Status: AC
  Administered 2019-10-04: 10 mg via RECTAL
  Filled 2019-10-04: qty 1

## 2019-10-04 MED ORDER — DOCUSATE SODIUM 100 MG PO CAPS
100.0000 mg | ORAL_CAPSULE | Freq: Two times a day (BID) | ORAL | Status: DC
  Administered 2019-10-04 – 2019-10-11 (×12): 100 mg via ORAL
  Filled 2019-10-04 (×14): qty 1

## 2019-10-04 NOTE — Progress Notes (Addendum)
Subjective: CC: Was placed back on o2 overnight. Reports sob when he ambulates. Not using IS much but pulling 1000. Some rib pain. Notes abdominal distension. He is unsure of flatus. Last BM PTA. He is only drinking boost.   Objective: Vital signs in last 24 hours: Temp:  [97.6 F (36.4 C)-98.6 F (37 C)] 97.7 F (36.5 C) (03/20 0920) Pulse Rate:  [62-67] 64 (03/20 0920) Resp:  [15-20] 19 (03/20 0920) BP: (104-134)/(45-65) 128/65 (03/20 0920) SpO2:  [90 %-99 %] 95 % (03/20 0920) Last BM Date: (PTA)  Intake/Output from previous day: 03/19 0701 - 03/20 0700 In: 430 [P.O.:430] Out: 1665 [Urine:1275; Chest Tube:390] Intake/Output this shift: No intake/output data recorded.  PE: Gen: elderly frail man in NAD HEENT: PERRL, HOH Heart: regular Lungs: good BS bilaterally. Right chest tube in place with serosang output in cannister. No airleak noted. Tube insertion site is clean. Output 390/24 hours.  Abd: soft, mild distension of the upper abdomen, NT, +BS GU: foley in place with yellow urine Ext: MAE  Lab Results:  Recent Labs    10/02/19 0516 10/03/19 0707  WBC 13.3* 13.2*  HGB 11.7* 11.0*  HCT 37.1* 35.1*  PLT 192 186   BMET Recent Labs    10/03/19 0707 10/04/19 0431  NA 143 140  K 4.4 4.1  CL 111 110  CO2 23 22  GLUCOSE 75 89  BUN 24* 21  CREATININE 1.84* 1.54*  CALCIUM 7.7* 7.6*   PT/INR Recent Labs    10/01/19 1416  LABPROT 14.3  INR 1.1   CMP     Component Value Date/Time   NA 140 10/04/2019 0431   K 4.1 10/04/2019 0431   CL 110 10/04/2019 0431   CO2 22 10/04/2019 0431   GLUCOSE 89 10/04/2019 0431   BUN 21 10/04/2019 0431   CREATININE 1.54 (H) 10/04/2019 0431   CREATININE 1.27 07/24/2011 1133   CALCIUM 7.6 (L) 10/04/2019 0431   PROT 6.6 10/16/2018 1200   ALBUMIN 3.6 10/16/2018 1200   AST 21 10/16/2018 1200   ALT 9 10/16/2018 1200   ALKPHOS 71 10/16/2018 1200   BILITOT 0.8 10/16/2018 1200   GFRNONAA 39 (L) 10/04/2019 0431   GFRAA 45 (L) 10/04/2019 0431   Lipase     Component Value Date/Time   LIPASE 20 07/23/2017 1145       Studies/Results: DG CHEST PORT 1 VIEW  Result Date: 10/04/2019 CLINICAL DATA:  Evaluate right-sided pneumothorax. EXAM: PORTABLE CHEST 1 VIEW COMPARISON:  October 03, 2019 FINDINGS: The right-sided chest tube is stable. No pneumothorax identified. Subcutaneous air is seen in the right chest wall. No left-sided pneumothorax. Opacity in left base is mildly more prominent. Mild opacity in the right base is mildly more prominent. Stable cardiomegaly. The hila and mediastinum are unchanged. No other acute abnormalities. IMPRESSION: 1. Stable right-sided chest tube.  No pneumothorax. 2. Increasing opacity in left base and right base may represent atelectasis or developing infiltrate. Recommend attention on follow-up. 3. No other acute abnormalities. Electronically Signed   By: Dorise Bullion III M.D   On: 10/04/2019 08:31   DG CHEST PORT 1 VIEW  Result Date: 10/03/2019 CLINICAL DATA:  Pneumothorax and chest tube EXAM: PORTABLE CHEST 1 VIEW COMPARISON:  Yesterday FINDINGS: Right chest tube in stable position. Extensive chest wall emphysema without evident progression. No visible pneumothorax. Reticulation in the bilateral lower lungs. Normal heart size and mediastinal contours. IMPRESSION: No visible pneumothorax today. Extensive right chest wall emphysema  persists. Electronically Signed   By: Monte Fantasia M.D.   On: 10/03/2019 07:18   DG Abd 2 Views  Result Date: 10/04/2019 CLINICAL DATA:  Abdominal distension. EXAM: ABDOMEN - 2 VIEW COMPARISON:  December 26, 2010 FINDINGS: Lung bases were better assessed on the chest x-ray from today. No free air, portal venous gas, or pneumatosis. Air-filled mildly prominent loops of large and small bowel may represent ileus. No convincing evidence of obstruction. An IVC filter is identified. IMPRESSION: Suspected ileus. No convincing evidence of obstruction. No  other acute abnormalities. Electronically Signed   By: Dorise Bullion III M.D   On: 10/04/2019 09:40    Anti-infectives: Anti-infectives (From admission, onward)   None       Assessment/Plan 86 year old male status post fall Right 3-6, 10-11 rib fractures- anesthesia placed epidural to help with pain control. Continue today. Defer to them on dosing.  Right hemopneumothorax- CXR today with no PTX. On -20 with no air leak. Required o2 overnight. Place on WS. AM Xray. Robitussin for secretions. Pulm toilet, IS, flutter valve.  Chronic kidney disease/urinary retention- cr up to 1.54 today. Recheck BMET in am.  Continue foley today for strict I/O's History of COPD - continue home nebulizers and medications History of multiple basal cell carcinomas, melanoma RLS- cont requip FEN -Reg per speech, IVFs, bowel regimen, suppository (ileus pattern noted on abd xray, no bm since admit) VTE - heparin subq ID -none currently Dispo - PT/OT rec HH   LOS: 3 days    Jillyn Ledger , [facility] Surgery 10/04/2019, 10:43 AM Please see Amion for pager number during day hours 7:00am-4:30pm

## 2019-10-04 NOTE — Anesthesia Post-op Follow-up Note (Signed)
  Anesthesia Pain Follow-up Note  Patient: Micheal Frank  Day #: 4  Date of Follow-up: 10/04/2019 Time: 3:35 PM  Last Vitals:  Vitals:   10/04/19 0920 10/04/19 1200  BP: 128/65 (!) 103/54  Pulse: 64 61  Resp: 19 18  Temp: 36.5 C 37.1 C  SpO2: 95% 91%    Level of Consciousness: alert  Pain: none   Side Effects:None  Catheter Site Exam:non-tender to palpation   Anti-Coag Meds (From admission, onward)   Start     Dose/Rate Route Frequency Ordered Stop   10/03/19 1400  heparin injection 5,000 Units     5,000 Units Subcutaneous Every 8 hours 10/03/19 1143      Epidural / Intrathecal (From admission, onward)   Start     Dose/Rate Route Frequency Ordered Stop   10/01/19 1845  ropivacaine (PF) 2 mg/mL (0.2%) (NAROPIN) injection     8 mL/hr 8 mL/hr  Epidural Continuous 10/01/19 1835         Plan: Continue current therapy of postop epidural at surgeon's request  Ricquel Foulk P Aunisty Reali

## 2019-10-04 NOTE — Progress Notes (Signed)
Physical Therapy Treatment Patient Details Name: ZACHARYAH POHLER MRN: [id number] DOB: 05/18/1926 Today's Date: 10/04/2019    History of Present Illness 86 year old male who arrives with a history of chronic kidney disease, COPD, history of brain bleed, h/o epidural abscess, blood clots, memory loss, recent pneumonia, who fell at home. Daughter states that he was likely trying to close a curtain, fell in between some chairs, landing on his right side onto some chairs. CT scan--right hemopneumothorax, right ribs 3-6, 10-11 fractures. 3/17 rt chest tube    PT Comments    Patient in better spirits and willing to progress mobility/ambulation. Able to get OOB with +1 min assist (using HOB elevated and rail as pt has a hospital bed at home already, per daughter). Pt on 4L O2 via [place] with sats >90% with ambulation x 45 ft and min assist (+1 for lines). Daughter present throughout.     Follow Up Recommendations  Home health PT;Supervision/Assistance - 24 hour     Equipment Recommendations  None recommended by PT    Recommendations for Other Services       Precautions / Restrictions Precautions Precautions: Fall;Other (comment) Precaution Comments: R chest tube, epidural Restrictions Weight Bearing Restrictions: No    Mobility  Bed Mobility Overal bed mobility: Needs Assistance Bed Mobility: Supine to Sit     Supine to sit: HOB elevated;Min guard(plus rail; pt has hospital bed at home)     General bed mobility comments: min cues; incr time for scooting to Eob  Transfers Overall transfer level: Needs assistance Equipment used: Rolling walker (2 wheeled) Transfers: Sit to/from Stand Sit to Stand: +2 safety/equipment;Min assist         General transfer comment: cues for hand placement, assist to power up; X2 reps for instruction  Ambulation/Gait Ambulation/Gait assistance: +2 safety/equipment;Min assist Gait Distance (Feet): 45 Feet Assistive device: Rolling walker (2  wheeled) Gait Pattern/deviations: Step-through pattern;Decreased stride length;Shuffle;Trunk flexed Gait velocity: decreased   General Gait Details: cues for upright posture to facilitate proximity to RW for safety; assist to turn RW   Stairs             Wheelchair Mobility    Modified Rankin (Stroke Patients Only)       Balance Overall balance assessment: Needs assistance Sitting-balance support: Feet supported Sitting balance-Leahy Scale: Fair     Standing balance support: Bilateral upper extremity supported;During functional activity Standing balance-Leahy Scale: Poor Standing balance comment: reliant on external support                            Cognition Arousal/Alertness: Lethargic;Suspect due to medications(became more alert throughout session) Behavior During Therapy: [facility] for tasks assessed/performed Overall Cognitive Status: Within Functional Limits for tasks assessed                                 General Comments: Daughter present in room during session.      Exercises Other Exercises Other Exercises: educated on use and frequency of IS (1-2 breaths per 10 minutes); pt able to pull 760ml    General Comments General comments (skin integrity, edema, etc.): daughter present; on 4L with sats >90% throughout      Pertinent Vitals/Pain Pain Assessment: Faces Faces Pain Scale: Hurts even more Pain Location: R flank Pain Descriptors / Indicators: Guarding;Grimacing;Discomfort Pain Intervention(s): Limited activity within patient's tolerance;Monitored during session;Premedicated before session;Repositioned  Home Living               Home Equipment: Kasandra Knudsen - single point;Shower seat;Walker - 2 wheels;Grab bars - tub/shower;Hand held shower head;Hospital bed      Prior Function            PT Goals (current goals can now be found in the care plan section) Acute Rehab PT Goals Patient Stated Goal: to return home Time  For Goal Achievement: 10/16/19 Potential to Achieve Goals: Good Progress towards PT goals: Progressing toward goals    Frequency    Min 5X/week      PT Plan Current plan remains appropriate    Co-evaluation              AM-PAC PT "6 Clicks" Mobility   Outcome Measure  Help needed turning from your back to your side while in a flat bed without using bedrails?: A Lot Help needed moving from lying on your back to sitting on the side of a flat bed without using bedrails?: A Lot Help needed moving to and from a bed to a chair (including a wheelchair)?: A Little Help needed standing up from a chair using your arms (e.g., wheelchair or bedside chair)?: A Little Help needed to walk in hospital room?: A Little Help needed climbing 3-5 steps with a railing? : A Lot 6 Click Score: 15    End of Session Equipment Utilized During Treatment: Oxygen Activity Tolerance: Patient tolerated treatment well Patient left: in chair;with call bell/phone within reach;with family/visitor present Nurse Communication: Mobility status PT Visit Diagnosis: Muscle weakness (generalized) (M62.81);History of falling (Z91.81);Difficulty in walking, not elsewhere classified (R26.2)     Time: [id number] PT Time Calculation (min) (ACUTE ONLY): 39 min  Charges:  $Gait Training: 38-52 mins                      Arby Barrette, PT Pager [phone]    Rexanne Mano 10/04/2019, 10:25 AM

## 2019-10-05 ENCOUNTER — Inpatient Hospital Stay (HOSPITAL_COMMUNITY): Payer: Medicare Other

## 2019-10-05 LAB — CBC
HCT: 32.9 % — ABNORMAL LOW (ref 39.0–52.0)
Hemoglobin: 10.5 g/dL — ABNORMAL LOW (ref 13.0–17.0)
MCH: 31.5 pg (ref 26.0–34.0)
MCHC: 31.9 g/dL (ref 30.0–36.0)
MCV: 98.8 fL (ref 80.0–100.0)
Platelets: 185 10*3/uL (ref 150–400)
RBC: 3.33 MIL/uL — ABNORMAL LOW (ref 4.22–5.81)
RDW: 14.3 % (ref 11.5–15.5)
WBC: 12.2 10*3/uL — ABNORMAL HIGH (ref 4.0–10.5)
nRBC: 0 % (ref 0.0–0.2)

## 2019-10-05 LAB — BASIC METABOLIC PANEL
Anion gap: 10 (ref 5–15)
BUN: 23 mg/dL (ref 8–23)
CO2: 23 mmol/L (ref 22–32)
Calcium: 8.1 mg/dL — ABNORMAL LOW (ref 8.9–10.3)
Chloride: 107 mmol/L (ref 98–111)
Creatinine, Ser: 1.47 mg/dL — ABNORMAL HIGH (ref 0.61–1.24)
GFR calc Af Amer: 47 mL/min — ABNORMAL LOW (ref >60)
GFR calc non Af Amer: 41 mL/min — ABNORMAL LOW (ref >60)
Glucose, Bld: 87 mg/dL (ref 70–99)
Potassium: 4.1 mmol/L (ref 3.5–5.1)
Sodium: 140 mmol/L (ref 135–145)

## 2019-10-05 LAB — GLUCOSE, CAPILLARY: Glucose-Capillary: 104 mg/dL — ABNORMAL HIGH (ref 70–99)

## 2019-10-05 MED ORDER — FUROSEMIDE 10 MG/ML IJ SOLN
20.0000 mg | Freq: Once | INTRAMUSCULAR | Status: AC
  Administered 2019-10-05: 20 mg via INTRAVENOUS
  Filled 2019-10-05: qty 2

## 2019-10-05 MED ORDER — BISACODYL 10 MG RE SUPP
10.0000 mg | Freq: Every day | RECTAL | Status: DC
  Administered 2019-10-09: 10 mg via RECTAL
  Filled 2019-10-05 (×2): qty 1

## 2019-10-05 NOTE — Progress Notes (Signed)
Patient alert and oriented, but confused, as evidenced by his attempt to get out of bed. Additionally, his concentration/attention is poor compared to his baseline at the beginning of the shift. CBG checked and WNL. MD paged and stated to monitor the patient.

## 2019-10-05 NOTE — Anesthesia Post-op Follow-up Note (Signed)
  Anesthesia Pain Follow-up Note  Patient: Micheal Frank  Day #: 4  Date of Follow-up: 10/05/2019 Time: 11:19 AM  Last Vitals:  Vitals:   10/05/19 0356 10/05/19 0749  BP: (!) 117/58 123/61  Pulse: 66 64  Resp: 16 20  Temp: 36.9 C 36.8 C  SpO2: 96% 96%    Level of Consciousness: alert  Pain: mild   Side Effects:None  Catheter Site Exam:clean, dry  Anti-Coag Meds (From admission, onward)   Start     Dose/Rate Route Frequency Ordered Stop   10/03/19 1400  heparin injection 5,000 Units     5,000 Units Subcutaneous Every 8 hours 10/03/19 1143      Epidural / Intrathecal (From admission, onward)   Start     Dose/Rate Route Frequency Ordered Stop   10/01/19 1845  ropivacaine (PF) 2 mg/mL (0.2%) (NAROPIN) injection     8 mL/hr 8 mL/hr  Epidural Continuous 10/01/19 1835         Plan: Continue current therapy of postop epidural at surgeon's request. Discussed with family and surgeon infection risk increases on days 5-7 of epidural. Today is day 4 of epidural. Plan to d/c on 3/22 as long as no significant changes in patients overall respiratory function.  Tiajuana Amass

## 2019-10-05 NOTE — Progress Notes (Signed)
Trauma/Critical Care Follow Up Note  Subjective:    Overnight Issues: sundowning last PM. Improved this AM  Objective:  Vital signs for last 24 hours: Temp:  [97.5 F (36.4 C)-98.7 F (37.1 C)] 98.3 F (36.8 C) (03/21 0749) Pulse Rate:  [60-67] 64 (03/21 0749) Resp:  [16-21] 20 (03/21 0749) BP: (103-129)/(53-61) 123/61 (03/21 0749) SpO2:  [91 %-98 %] 96 % (03/21 0749)  Hemodynamic parameters for last 24 hours:    Intake/Output from previous day: 03/20 0701 - 03/21 0700 In: 0  Out: 1585 [Urine:1175; Chest Tube:410]  Intake/Output this shift: No intake/output data recorded.  Vent settings for last 24 hours:    Physical Exam:  Gen: comfortable, no distress Neuro: non-focal exam HEENT: PERRL Neck: supple CV: RRR Pulm: unlabored breathing, L chest tube in place 410cc o/p, thoracic epidural Abd: soft, NT GU: clear yellow urine, foley Extr: wwp, no edema   Results for orders placed or performed during the hospital encounter of 09/30/19 (from the past 24 hour(s))  Glucose, capillary     Status: Abnormal   Collection Time: 10/05/19  2:29 AM  Result Value Ref Range   Glucose-Capillary 104 (H) 70 - 99 mg/dL  CBC     Status: Abnormal   Collection Time: 10/05/19  4:52 AM  Result Value Ref Range   WBC 12.2 (H) 4.0 - 10.5 K/uL   RBC 3.33 (L) 4.22 - 5.81 MIL/uL   Hemoglobin 10.5 (L) 13.0 - 17.0 g/dL   HCT 32.9 (L) 39.0 - 52.0 %   MCV 98.8 80.0 - 100.0 fL   MCH 31.5 26.0 - 34.0 pg   MCHC 31.9 30.0 - 36.0 g/dL   RDW 14.3 11.5 - 15.5 %   Platelets 185 150 - 400 K/uL   nRBC 0.0 0.0 - 0.2 %  Basic metabolic panel     Status: Abnormal   Collection Time: 10/05/19  4:52 AM  Result Value Ref Range   Sodium 140 135 - 145 mmol/L   Potassium 4.1 3.5 - 5.1 mmol/L   Chloride 107 98 - 111 mmol/L   CO2 23 22 - 32 mmol/L   Glucose, Bld 87 70 - 99 mg/dL   BUN 23 8 - 23 mg/dL   Creatinine, Ser 1.47 (H) 0.61 - 1.24 mg/dL   Calcium 8.1 (L) 8.9 - 10.3 mg/dL   GFR calc non Af Amer  41 (L) >60 mL/min   GFR calc Af Amer 47 (L) >60 mL/min   Anion gap 10 5 - 15    Assessment & Plan: The plan of care was discussed with the bedside nurse for the day who is in agreement with this plan and no additional concerns were raised.   Present on Admission: . Pneumothorax on right    LOS: 4 days   Additional comments:I reviewed the patient's new clinical lab test results.   and I reviewed the patients new imaging test results.    52M fall Right 3-6, 10-11 rib fractures- anesthesia placed epidural to help with pain control. Continue today. Anesthesia to dose.  Right hemopneumothorax- CXR todaywith PTX after WS yesterday, return to -20 sxn. Await o/p to decrease. Robitussin for secretions. Pulm toilet, IS, flutter valve.  Chronic kidney disease/urinary retention-cr down today.Recheck BMET in am. Continue foley today for strict I/O's. Lasix x1 today History of COPD - continue home nebulizers and medications History of multiple basal cell carcinomas, melanoma RLS- cont requip FEN -Reg per speech, IVFs, bowel regimen, suppository (ileus pattern noted on abd  xray, no bm since admit) VTE -heparin subq ID -none currently Dispo - PT/OT rec HH, home when chest tube out   Jesusita Oka, MD Trauma & General Surgery Please use [email or web address] to contact on call provider  10/05/2019  *Care during the described time interval was provided by me. I have reviewed this patient's available data, including medical history, events of note, physical examination and test results as part of my evaluation.

## 2019-10-06 LAB — URINALYSIS, ROUTINE W REFLEX MICROSCOPIC
Bilirubin Urine: NEGATIVE
Glucose, UA: NEGATIVE mg/dL
Ketones, ur: 5 mg/dL — AB
Nitrite: NEGATIVE
Protein, ur: NEGATIVE mg/dL
Specific Gravity, Urine: 1.008 (ref 1.005–1.030)
pH: 7 (ref 5.0–8.0)

## 2019-10-06 MED ORDER — GERHARDT'S BUTT CREAM
TOPICAL_CREAM | Freq: Two times a day (BID) | CUTANEOUS | Status: DC
  Administered 2019-10-10 – 2019-10-11 (×2): 1 via TOPICAL
  Filled 2019-10-06: qty 1

## 2019-10-06 MED ORDER — METOPROLOL TARTRATE 5 MG/5ML IV SOLN
5.0000 mg | Freq: Once | INTRAVENOUS | Status: AC
  Administered 2019-10-06: 5 mg via INTRAVENOUS
  Filled 2019-10-06: qty 5

## 2019-10-06 NOTE — Progress Notes (Signed)
Physical Therapy Treatment Patient Details Name: Micheal Frank MRN: [id number] DOB: 1926-02-28 Today's Date: 10/06/2019    History of Present Illness 86 year old male who arrives with a history of chronic kidney disease, COPD, history of brain bleed, h/o epidural abscess, blood clots, memory loss, recent pneumonia, who fell at home. Daughter states that he was likely trying to close a curtain, fell in between some chairs, landing on his right side onto some chairs. CT scan--right hemopneumothorax, right ribs 3-6, 10-11 fractures. 3/17 rt chest tube    PT Comments    Patient was confused overnight per RN. Oriented at this time. Incr time for multiple lines, O2 and due to pt having diarrhea when he first stood up. (Pt was not aware he had diarrhea and repeatedly denying that it occurred). Overall, tolerated ambulation on 6L [place] O2 with sats generally >95% with mild dyspnea. Daughter present throughout and she advised MD during his visit that family only wants pt to go home.    Follow Up Recommendations  Home health PT;Supervision/Assistance - 24 hour     Equipment Recommendations  None recommended by PT    Recommendations for Other Services       Precautions / Restrictions Precautions Precautions: Fall;Other (comment) Precaution Comments: R chest tube, epidural Restrictions Weight Bearing Restrictions: No    Mobility  Bed Mobility Overal bed mobility: Needs Assistance Bed Mobility: Supine to Sit     Supine to sit: HOB elevated;Supervision(plus rail; pt has hospital bed at home)     General bed mobility comments: min cues; supervision for safety and lines  Transfers Overall transfer level: Needs assistance Equipment used: Rolling walker (2 wheeled) Transfers: Sit to/from Stand Sit to Stand: +2 safety/equipment;Min assist         General transfer comment: cues for hand placement, assist to power up; X3 reps for instruction  Ambulation/Gait Ambulation/Gait  assistance: +2 safety/equipment;Min assist Gait Distance (Feet): 90 Feet(3, 8 ft) Assistive device: Rolling walker (2 wheeled) Gait Pattern/deviations: Step-through pattern;Decreased stride length;Shuffle;Trunk flexed Gait velocity: decreased   General Gait Details: cues for upright posture to facilitate proximity to RW for safety; assist to turn RW   Stairs             Wheelchair Mobility    Modified Rankin (Stroke Patients Only)       Balance Overall balance assessment: Needs assistance Sitting-balance support: Feet supported Sitting balance-Leahy Scale: Fair     Standing balance support: Bilateral upper extremity supported;During functional activity Standing balance-Leahy Scale: Poor Standing balance comment: reliant on external support                            Cognition Arousal/Alertness: Awake/alert(became more alert throughout session) Behavior During Therapy: WFL for tasks assessed/performed Overall Cognitive Status: Within Functional Limits for tasks assessed                                 General Comments: Daughter present in room during session.      Exercises      General Comments General comments (skin integrity, edema, etc.): when pt stood, noted loose stool on bed pad and pt proceeded to have diarrhea onto the floor. Pt unaware and denied having a BM even when told what was happening. Dtr able to get [facility] and pt assisted to sit. NT in to assist with cleaning and changing pt. Incr time  Pertinent Vitals/Pain Pain Assessment: Faces Faces Pain Scale: Hurts a little bit Pain Location: R flank Pain Descriptors / Indicators: Guarding;Grimacing;Discomfort Pain Intervention(s): Limited activity within patient's tolerance;Monitored during session;Repositioned;Premedicated before session    Home Living                      Prior Function            PT Goals (current goals can now be found in the care plan  section) Acute Rehab PT Goals Patient Stated Goal: to return home Time For Goal Achievement: 10/16/19 Potential to Achieve Goals: Good Progress towards PT goals: Progressing toward goals    Frequency    Min 5X/week      PT Plan Current plan remains appropriate    Co-evaluation              AM-PAC PT "6 Clicks" Mobility   Outcome Measure  Help needed turning from your back to your side while in a flat bed without using bedrails?: A Lot Help needed moving from lying on your back to sitting on the side of a flat bed without using bedrails?: A Lot Help needed moving to and from a bed to a chair (including a wheelchair)?: A Little Help needed standing up from a chair using your arms (e.g., wheelchair or bedside chair)?: A Little Help needed to walk in hospital room?: A Little Help needed climbing 3-5 steps with a railing? : A Lot 6 Click Score: 15    End of Session Equipment Utilized During Treatment: Oxygen Activity Tolerance: Patient tolerated treatment well Patient left: in chair;with call bell/phone within reach;with family/visitor present;with chair alarm set Nurse Communication: Mobility status;Other (comment)(diarrhea; MD in and d/c'd epidural during session) PT Visit Diagnosis: Muscle weakness (generalized) (M62.81);History of falling (Z91.81);Difficulty in walking, not elsewhere classified (R26.2)     Time: [id number] PT Time Calculation (min) (ACUTE ONLY): 61 min  Charges:  $Gait Training: 38-52 mins                      Arby Barrette, PT Pager [phone]    Rexanne Mano 10/06/2019, 10:21 AM

## 2019-10-06 NOTE — Evaluation (Signed)
Occupational Therapy Evaluation Patient Details Name: Micheal Frank MRN: [id number] DOB: 1926/03/17 Today's Date: 10/06/2019    History of Present Illness 86 year old male who arrives with a history of chronic kidney disease, COPD, history of brain bleed, h/o epidural abscess, blood clots, memory loss, recent pneumonia, who fell at home. Daughter states that he was likely trying to close a curtain, fell in between some chairs, landing on his right side onto some chairs. CT scan--right hemopneumothorax, right ribs 3-6, 10-11 fractures. 3/17 rt chest tube   Clinical Impression   Pt progressing towards established OT goals. Upon arrival, pt requesting to use restroom for BM. Pt performing stand pivot to [facility] with Min Guard A. Pt performing toilet hygiene with Min A for gown management and balance in standing. Pt performing hand hygiene at sink with Min Guard A. SpO2 maintaining in 90s on 6L O2 and HR elevating to 120 with noted A-fib; RN notified. Continue to recommend dc to home with HHOT and will continue to follow acutely as admitted.     Follow Up Recommendations  Home health OT;Supervision/Assistance - 24 hour    Equipment Recommendations  None recommended by OT    Recommendations for Other Services       Precautions / Restrictions Precautions Precautions: Fall;Other (comment) Precaution Comments: R chest tube Restrictions Weight Bearing Restrictions: No      Mobility Bed Mobility Overal bed mobility: Needs Assistance Bed Mobility: Supine to Sit     Supine to sit: Supervision;HOB elevated(plus rail; pt has hospital bed at home)     General bed mobility comments: Supervision for safety  Transfers Overall transfer level: Needs assistance Equipment used: Rolling walker (2 wheeled) Transfers: Sit to/from Stand Sit to Stand: Min guard         General transfer comment: Min Guard A for safety    Balance Overall balance assessment: Needs assistance Sitting-balance  support: Feet supported;No upper extremity supported Sitting balance-Leahy Scale: Fair     Standing balance support: During functional activity;No upper extremity supported Standing balance-Leahy Scale: Fair Standing balance comment: Maintaining static standing at sink without UE                            ADL either performed or assessed with clinical judgement   ADL Overall ADL's : Needs assistance/impaired     Grooming: Min guard;Wash/dry face;Standing Grooming Details (indicate cue type and reason): Pt performing hand hygiene at sink with Min Guard A for safety                 Toilet Transfer: Min guard;Ambulation;BSC;RW Toilet Transfer Details (indicate cue type and reason): Min Guard for safety at [place] and Hygiene: Minimal assistance;Sit to/from stand Toileting - Clothing Manipulation Details (indicate cue type and reason): Min A for managing gown while pt performed peri care including posterior peri care     Functional mobility during ADLs: Minimal assistance;Rolling walker General ADL Comments: Pt performing toileting and hand hygiene demonstrating increased activity tolerance.     Vision         Perception     Praxis      Pertinent Vitals/Pain Pain Assessment: Faces Faces Pain Scale: Hurts a little bit Pain Location: R flank Pain Descriptors / Indicators: Guarding;Grimacing;Discomfort Pain Intervention(s): Monitored during session;Limited activity within patient's tolerance;Repositioned     Hand Dominance     Extremity/Trunk Assessment Upper Extremity Assessment Upper Extremity Assessment: Generalized weakness   Lower Extremity  Assessment Lower Extremity Assessment: Defer to PT evaluation       Communication     Cognition Arousal/Alertness: Awake/alert Behavior During Therapy: WFL for tasks assessed/performed Overall Cognitive Status: Impaired/Different from baseline Area of Impairment:  Memory;Following commands;Awareness;Problem solving;Safety/judgement;Attention                               General Comments: Daughter reporting that pt is more confused and very tangient in conversation. Pt following simple commands with increased cues. HOH is difficult sometimes to hear cues. Laughing throughout session   General Comments  Daughter present during session. HR elevating to 120s and noting A-fib while at sink. Rn notified.    Exercises     Shoulder Instructions      Home Living                                          Prior Functioning/Environment                   OT Problem List:        OT Treatment/Interventions:      OT Goals(Current goals can be found in the care plan section) Acute Rehab OT Goals Patient Stated Goal: to return home OT Goal Formulation: With patient Time For Goal Achievement: 10/16/19 Potential to Achieve Goals: Good ADL Goals Pt Will Perform Grooming: with supervision;standing Pt Will Perform Upper Body Dressing: with set-up;with supervision;sitting Pt Will Perform Lower Body Dressing: with min assist;with adaptive equipment;sit to/from stand Pt Will Transfer to Toilet: with supervision;ambulating Pt Will Perform Toileting - Clothing Manipulation and hygiene: with supervision;sit to/from stand Additional ADL Goal #1: Pt will perform bed mobility with moderate assistance in preparation for ADL.  OT Frequency: Min 2X/week   Barriers to D/C:            Co-evaluation              AM-PAC OT "6 Clicks" Daily Activity     Outcome Measure Help from another person eating meals?: None Help from another person taking care of personal grooming?: A Little Help from another person toileting, which includes using toliet, bedpan, or urinal?: A Lot Help from another person bathing (including washing, rinsing, drying)?: A Lot Help from another person to put on and taking off regular upper body  clothing?: A Little Help from another person to put on and taking off regular lower body clothing?: Total 6 Click Score: 15   End of Session Equipment Utilized During Treatment: Oxygen(5L) Nurse Communication: Mobility status  Activity Tolerance: Patient tolerated treatment well Patient left: in chair;with call bell/phone within reach;with chair alarm set;with family/visitor present  OT Visit Diagnosis: Pain;Muscle weakness (generalized) (M62.81) Pain - Right/Left: Right Pain - part of body: (Rib)                Time: [id number] OT Time Calculation (min): 25 min Charges:  OT General Charges $OT Visit: 1 Visit OT Treatments $Self Care/Home Management : 23-37 mins  [place], OTR/L Acute Rehab Pager: [phone] Office: [place] 10/06/2019, 5:08 PM

## 2019-10-06 NOTE — Progress Notes (Signed)
Patient ID: DIO BETHELL, male   DOB: 1926-02-04, 86 y.o.   MRN: [id number]     Subjective: Up on [facility] RN req cream for perianal region Anesthesia just removed epidural  ROS negative except as listed above. Objective: Vital signs in last 24 hours: Temp:  [97.6 F (36.4 C)-98 F (36.7 C)] 97.6 F (36.4 C) (03/22 0743) Pulse Rate:  [56-71] 64 (03/22 0743) Resp:  [17-21] 17 (03/22 0743) BP: (118-152)/(61-77) 136/70 (03/22 0743) SpO2:  [97 %-100 %] 100 % (03/22 0743) Last BM Date: 10/05/19  Intake/Output from previous day: 03/21 0701 - 03/22 0700 In: 2134.1 [P.O.:30; I.V.:1531.3] Out: 3441 [Urine:3025; Chest Tube:416] Intake/Output this shift: Total I/O In: 200 [P.O.:200] Out: 350 [Urine:350]  General appearance: cooperative and no distress Resp: clear to auscultation bilaterally Cardio: regular rate and rhythm GI: soft, NT Extremities: min edema Neuro: HOH. F/C  Lab Results: CBC  Recent Labs    10/05/19 0452  WBC 12.2*  HGB 10.5*  HCT 32.9*  PLT 185   BMET Recent Labs    10/04/19 0431 10/05/19 0452  NA 140 140  K 4.1 4.1  CL 110 107  CO2 22 23  GLUCOSE 89 87  BUN 21 23  CREATININE 1.54* 1.47*  CALCIUM 7.6* 8.1*   PT/INR No results for input(s): LABPROT, INR in the last 72 hours.  Anti-infectives: Anti-infectives (From admission, onward)   None      Assessment/Plan: 30M fall Right 3-6, 10-11 rib fractures- anesthesia placed epidural to help with pain control. Anesthesia D/Cd it today Right hemopneumothorax- CXR 3/21 with PTX after WS, continue -20 today, CXR in AM. Output remains high. Chronic kidney disease/urinary retention-Recheck BMET in am. Continue foley today for strict I/O's. Lasix x1 3/21 and diuresed 1900. D/C foley. History of COPD - continue home nebulizers and medications History of multiple basal cell carcinomas, melanoma RLS- cont requip FEN -Reg per speech, IVFs, bowel regimen, BM this AM VTE -heparin SQ held until  3/23 for epidural coming out ID -none currently Dispo - PT/OT rec HH, home when chest tube out. I D/W his daughter - they have a lot of help and a handicap accessible house.   LOS: 5 days    Georganna Skeans, MD, MPH, FACS Trauma & General Surgery Use [email or web address] to contact on call provider  10/06/2019

## 2019-10-06 NOTE — Anesthesia Post-op Follow-up Note (Signed)
  Anesthesia Pain Follow-up Note  Patient: Micheal Frank  Day #: 5  Date of Follow-up: 10/06/2019 Time: 10:14 AM  Last Vitals:  Vitals:   10/06/19 0351 10/06/19 0743  BP: 130/62 136/70  Pulse: 65 64  Resp: 17 17  Temp: 36.6 C 36.4 C  SpO2: 98% 100%    Level of Consciousness: alert  Pain: mild   Side Effects:None  Catheter Site Exam:clean, dry, no drainage  Epidural / Intrathecal (From admission, onward)   Start     Dose/Rate Route Frequency Ordered Stop   10/01/19 1845  ropivacaine (PF) 2 mg/mL (0.2%) (NAROPIN) injection     8 mL/hr 8 mL/hr  Epidural Continuous 10/01/19 1835         Plan: Catheter removed/tip intact at surgeon's request  [name]

## 2019-10-06 NOTE — Progress Notes (Signed)
Patient is refusing most of his medications tonight. When offered Tylenol, he took the medicine cup and emphatically dumped it into the bed stating that he "didn't like it." His other scheduled medications were offered but the patient declined. RN offered medications a second time after an hour but patient still refused.   Gailen Shelter RN

## 2019-10-07 ENCOUNTER — Inpatient Hospital Stay (HOSPITAL_COMMUNITY): Payer: Medicare Other

## 2019-10-07 LAB — BASIC METABOLIC PANEL
Anion gap: 14 (ref 5–15)
BUN: 19 mg/dL (ref 8–23)
CO2: 21 mmol/L — ABNORMAL LOW (ref 22–32)
Calcium: 8.8 mg/dL — ABNORMAL LOW (ref 8.9–10.3)
Chloride: 103 mmol/L (ref 98–111)
Creatinine, Ser: 1.54 mg/dL — ABNORMAL HIGH (ref 0.61–1.24)
GFR calc Af Amer: 45 mL/min — ABNORMAL LOW (ref >60)
GFR calc non Af Amer: 39 mL/min — ABNORMAL LOW (ref >60)
Glucose, Bld: 86 mg/dL (ref 70–99)
Potassium: 3.9 mmol/L (ref 3.5–5.1)
Sodium: 138 mmol/L (ref 135–145)

## 2019-10-07 LAB — URINE CULTURE: Culture: NO GROWTH

## 2019-10-07 MED ORDER — HALOPERIDOL LACTATE 5 MG/ML IJ SOLN: 5.0000 mg | Freq: Four times a day (QID) | INTRAMUSCULAR | Status: DC | PRN

## 2019-10-07 MED ORDER — BUPIVACAINE HCL (PF) 0.25 % IJ SOLN
INTRAMUSCULAR | Status: AC
  Filled 2019-10-07: qty 30

## 2019-10-07 NOTE — Progress Notes (Signed)
  Speech Language Pathology Treatment: Dysphagia  Patient Details Name: Micheal Frank MRN: [id number] DOB: 12-17-1925 Today's Date: 10/07/2019 Time: 9741-6384 SLP Time Calculation (min) (ACUTE ONLY): 14 min  Assessment / Plan / Recommendation Clinical Impression  Pt encountered in chair with daughter in room for session. Pt observed with thin liquids via straw. Immediately following initial sips, pt noted with cough, but not seen following any other trials. Per previous session (3/19), pt and family have accepted the risk for aspiration, allowing the pt any PO's he would like. Pt's daughter reported pt has only had x1 episode of s/sx of aspiration (coughing) since last seen. SLP confirmed that the pt and family still understood the risk of aspiration and provided education re: general aspiration and esophageal precautions. Pt's daughter was in agreement with all education and recommendations provided, reporting she is already ensuring the pt follows these precautions. Will s/o at this time.    HPI HPI: 86 year old male who arrives after a fall at home. CT scan--right hemopneumothorax, right ribs 3-6, 10-11 fractures. 3/17 rt chest tube. Pt had MBS in 2018 that revealed a CP bar with mild residuals above and backflow but with no aspiration/penetration.. Regular solids and thin liquids were recommended at that time with reinforcement of esophageal precautions. PMH includes: CKD, COPD, h/o brain bleed, h/o epidural abscess, blood clots, memory loss, recent PNA, GERD.      SLP Plan  All goals met       Recommendations  Diet recommendations: Regular;Thin liquid Liquids provided via: Cup;Teaspoon;Straw Medication Administration: Whole meds with puree Supervision: Full supervision/cueing for compensatory strategies;Staff to assist with self feeding Compensations: Slow rate;Small sips/bites;Follow solids with liquid Postural Changes and/or Swallow Maneuvers: Upright 30-60 min after meal;Seated  upright 90 degrees                Oral Care Recommendations: Oral care BID Follow up Recommendations: 24 hour supervision/assistance SLP Visit Diagnosis: Dysphagia, pharyngoesophageal phase (R13.14) Plan: All goals met       [place], Student SLP Office: [phone]  10/07/2019, 12:33 PM

## 2019-10-07 NOTE — Progress Notes (Signed)
Physical Therapy Treatment Patient Details Name: Micheal Frank MRN: [id number] DOB: March 25, 1926 Today's Date: 10/07/2019    History of Present Illness 86 year old male who arrives with a history of chronic kidney disease, COPD, history of brain bleed, h/o epidural abscess, blood clots, memory loss, recent pneumonia, who fell at home. Daughter states that he was likely trying to close a curtain, fell in between some chairs, landing on his right side onto some chairs. CT scan--right hemopneumothorax, right ribs 3-6, 10-11 fractures. 3/17 rt chest tube; incr confusion/?sundowning    PT Comments    Patient more confused this date (although apparently better than he was overnight). He required incr cues and physical assist for transfers and gait. Was not able to process cues for safety as quickly as previous sessions. On 5L [place] O2, placed initially on 4L (tank does either 4 or 6L) however once mobilizing, could not get a good waveform via pleth with sats reading 84-86% (even with standing rest). Increased to 6L with sats 94% when returned to sitting and new pleth placed on his earlobe. Returned to 5L via wall O2 with sats remaining >93%.     Follow Up Recommendations  Home health PT;Supervision/Assistance - 24 hour  Daughters have been clear they do not want SNF; will continue to monitor needs if confusion worsens/persists.      Equipment Recommendations  None recommended by PT    Recommendations for Other Services       Precautions / Restrictions Precautions Precautions: Fall;Other (comment) Precaution Comments: R chest tube Restrictions Weight Bearing Restrictions: No    Mobility  Bed Mobility Overal bed mobility: Needs Assistance Bed Mobility: Supine to Sit     Supine to sit: HOB elevated;Min guard(plus rail; pt has hospital bed at home)     General bed mobility comments: min cues; minguard 2/2 decr cognition & for safety and lines  Transfers Overall transfer level: Needs  assistance Equipment used: Rolling walker (2 wheeled) Transfers: Sit to/from Stand Sit to Stand: +2 safety/equipment;Min assist         General transfer comment: cues for hand placement, assist to power up; X2 reps for instruction/toileting  Ambulation/Gait Ambulation/Gait assistance: Min assist;+2 physical assistance;+2 safety/equipment Gait Distance (Feet): 90 Feet Assistive device: Rolling walker (2 wheeled) Gait Pattern/deviations: Step-through pattern;Decreased stride length;Shuffle;Trunk flexed Gait velocity: decreased   General Gait Details: required physical assist for upright posture to facilitate proximity to RW for safety--progressed to stopping to rest in standing, reset posture and position to RW but with poor carryover with each attempt; assist to turn RW   Stairs             Wheelchair Mobility    Modified Rankin (Stroke Patients Only)       Balance Overall balance assessment: Needs assistance Sitting-balance support: Feet supported Sitting balance-Leahy Scale: Fair     Standing balance support: Bilateral upper extremity supported;During functional activity Standing balance-Leahy Scale: Poor Standing balance comment: reliant on external support                            Cognition Arousal/Alertness: Awake/alert(became more alert throughout session) Behavior During Therapy: Restless;Impulsive Overall Cognitive Status: Impaired/Different from baseline Area of Impairment: Attention;Memory;Following commands;Safety/judgement;Awareness;Problem solving                   Current Attention Level: Sustained Memory: Decreased short-term memory Following Commands: Follows one step commands inconsistently Safety/Judgement: Decreased awareness of safety;Decreased awareness of deficits Awareness:  Intellectual Problem Solving: Difficulty sequencing;Requires verbal cues;Requires tactile cues General Comments: Orientation NT; was agitated  overnight; impulsive; poor use of RW compared to previous sessions      Exercises      General Comments General comments (skin integrity, edema, etc.): Daughter present; RN reports his confusion began yesterday afternoon and progressed to agitation overnight. Noted wet with some BM in the bed and assisted to [facility] with minimal results.       Pertinent Vitals/Pain Pain Assessment: Faces Faces Pain Scale: No hurt    Home Living                      Prior Function            PT Goals (current goals can now be found in the care plan section) Acute Rehab PT Goals Patient Stated Goal: to return home Time For Goal Achievement: 10/16/19 Potential to Achieve Goals: Good Progress towards PT goals: Not progressing toward goals - comment(more confused; required incr assist)    Frequency    Min 5X/week      PT Plan Current plan remains appropriate(if confusion worsens, may need to re-address)    Co-evaluation              AM-PAC PT "6 Clicks" Mobility   Outcome Measure  Help needed turning from your back to your side while in a flat bed without using bedrails?: A Lot Help needed moving from lying on your back to sitting on the side of a flat bed without using bedrails?: A Lot Help needed moving to and from a bed to a chair (including a wheelchair)?: A Little Help needed standing up from a chair using your arms (e.g., wheelchair or bedside chair)?: A Little Help needed to walk in hospital room?: A Little Help needed climbing 3-5 steps with a railing? : A Lot 6 Click Score: 15    End of Session Equipment Utilized During Treatment: Oxygen Activity Tolerance: Patient limited by fatigue;Treatment limited secondary to medical complications (Comment)(confusion) Patient left: in chair;with call bell/phone within reach;with family/visitor present;with chair alarm set Nurse Communication: Mobility status PT Visit Diagnosis: Muscle weakness (generalized) (M62.81);History  of falling (Z91.81);Difficulty in walking, not elsewhere classified (R26.2)     Time: [id number] PT Time Calculation (min) (ACUTE ONLY): 33 min  Charges:  $Gait Training: 23-37 mins                      Arby Barrette, PT Pager [phone]    Rexanne Mano 10/07/2019, 11:48 AM

## 2019-10-07 NOTE — Progress Notes (Signed)
Patient refused all morning medications. He said he didn't "want them right now."

## 2019-10-07 NOTE — Progress Notes (Signed)
Patient is agreeable to take his scheduled midnight medications.

## 2019-10-07 NOTE — Progress Notes (Signed)
SLP Cancellation Note  Patient Details Name: Micheal Frank MRN: [id number] DOB: 03/16/1926   Cancelled treatment:       Reason Eval/Treat Not Completed: Other (comment) Pt working with PT at the moment. Will f/u as able. Pt has been on regular solids and thin liquids, accepting aspiration risk.     Osie Bond., M.A. [place] [facility] Pager [phone] Office [phone]  10/07/2019, 11:20 AM

## 2019-10-07 NOTE — Progress Notes (Signed)
Trauma/Critical Care Follow Up Note  Subjective:    Overnight Issues: NAEON  Objective:  Vital signs for last 24 hours: Temp:  [97.1 F (36.2 C)-98.4 F (36.9 C)] 97.9 F (36.6 C) (03/23 0719) Pulse Rate:  [79-101] 101 (03/23 0719) Resp:  [16-29] 20 (03/23 0719) BP: (103-146)/(61-75) 146/73 (03/23 0719) SpO2:  [96 %-100 %] 99 % (03/23 0843)  Hemodynamic parameters for last 24 hours:    Intake/Output from previous day: 03/22 0701 - 03/23 0700 In: 450 [P.O.:450] Out: 2055 [Urine:1875; Chest Tube:180]  Intake/Output this shift: Total I/O In: 1895.4 [I.V.:1895.4] Out: -   Vent settings for last 24 hours:    Physical Exam:  Gen: comfortable, no distress Neuro: non-focal exam HEENT: PERRL Neck: supple CV: RRR Pulm: unlabored breathing, R chest tube in place 180cc o/p, thoracic epidural out yest Abd: soft, NT GU: clear yellow urine, foley Extr: wwp, no edema   Results for orders placed or performed during the hospital encounter of 09/30/19 (from the past 24 hour(s))  Urinalysis, Routine w reflex microscopic     Status: Abnormal   Collection Time: 10/06/19  5:06 PM  Result Value Ref Range   Color, Urine STRAW (A) YELLOW   APPearance CLEAR CLEAR   Specific Gravity, Urine 1.008 1.005 - 1.030   pH 7.0 5.0 - 8.0   Glucose, UA NEGATIVE NEGATIVE mg/dL   Hgb urine dipstick SMALL (A) NEGATIVE   Bilirubin Urine NEGATIVE NEGATIVE   Ketones, ur 5 (A) NEGATIVE mg/dL   Protein, ur NEGATIVE NEGATIVE mg/dL   Nitrite NEGATIVE NEGATIVE   Leukocytes,Ua TRACE (A) NEGATIVE   RBC / HPF 11-20 0 - 5 RBC/hpf   WBC, UA 6-10 0 - 5 WBC/hpf   Bacteria, UA RARE (A) NONE SEEN  Basic metabolic panel     Status: Abnormal   Collection Time: 10/07/19  7:05 AM  Result Value Ref Range   Sodium 138 135 - 145 mmol/L   Potassium 3.9 3.5 - 5.1 mmol/L   Chloride 103 98 - 111 mmol/L   CO2 21 (L) 22 - 32 mmol/L   Glucose, Bld 86 70 - 99 mg/dL   BUN 19 8 - 23 mg/dL   Creatinine, Ser 1.54 (H)  0.61 - 1.24 mg/dL   Calcium 8.8 (L) 8.9 - 10.3 mg/dL   GFR calc non Af Amer 39 (L) >60 mL/min   GFR calc Af Amer 45 (L) >60 mL/min   Anion gap 14 5 - 15    Assessment & Plan: The plan of care was discussed with the bedside nurse for the day who is in agreement with this plan and no additional concerns were raised.   Present on Admission: . Pneumothorax on right    LOS: 6 days   Additional comments:I reviewed the patient's new clinical lab test results.   and I reviewed the patients new imaging test results.    38M fall Right 3-6, 10-11 rib fractures- anesthesia placed epidural to help with pain control. Continue today. Anesthesia to dose.  Right hemopneumothorax- CXR with no PTX, on WS. Await o/p to decrease. Robitussin for secretions. Pulm toilet, IS, flutter valve.  Chronic kidney disease/urinary retention-cr at baseline, foley out today, d/c MIVF History of COPD - continue home nebulizers and medications History of multiple basal cell carcinomas, melanoma RLS- cont requip FEN -Regular VTE -heparin subq ID -none currently Dispo - PT/OT rec HH, home when chest tube out   Jesusita Oka, MD Trauma & General Surgery Please use [email or web address]  to contact on call provider  10/07/2019  *Care during the described time interval was provided by me. I have reviewed this patient's available data, including medical history, events of note, physical examination and test results as part of my evaluation.

## 2019-10-08 ENCOUNTER — Inpatient Hospital Stay (HOSPITAL_COMMUNITY): Payer: Medicare Other

## 2019-10-08 MED ORDER — ACETAMINOPHEN 500 MG PO TABS
1000.0000 mg | ORAL_TABLET | Freq: Four times a day (QID) | ORAL | Status: DC
  Administered 2019-10-09 – 2019-10-12 (×12): 1000 mg via ORAL
  Filled 2019-10-08 (×11): qty 2

## 2019-10-08 MED ORDER — ENSURE ENLIVE PO LIQD
237.0000 mL | Freq: Three times a day (TID) | ORAL | Status: DC
  Administered 2019-10-08 – 2019-10-12 (×9): 237 mL via ORAL

## 2019-10-08 NOTE — Progress Notes (Signed)
Rehab Admissions Coordinator Note:  Per PT recommendation, this patient was screened by Raechel Ache for appropriateness for an Inpatient Acute Rehab Consult.  At this time, it is noted that the patient is Min A for transfers and ambulation and Min G/Min A for ADLs. Given that this patient has a history of memory loss, would recommended supervision at DC. As pt already close to that anticipated goal level, pt does not require an IP Rehab stay. AC will not pursue consult at this time.   Raechel Ache 10/08/2019, 12:51 PM  I can be reached at [phone].

## 2019-10-08 NOTE — Progress Notes (Signed)
Physical Therapy Treatment Patient Details Name: Micheal Frank MRN: [id number] DOB: 1926-04-15 Today's Date: 10/08/2019    History of Present Illness 86 year old male who arrives with a history of chronic kidney disease, COPD, history of brain bleed, h/o epidural abscess, blood clots, memory loss, recent pneumonia, who fell at home. Daughter states that he was likely trying to close a curtain, fell in between some chairs, landing on his right side onto some chairs. CT scan--right hemopneumothorax, right ribs 3-6, 10-11 fractures. 3/17 rt chest tube; incr confusion/?sundowning    PT Comments    Pt intermittently lethargic during session with increased confusion compared to baseline per daughter. Pt requires physical assistance to perform all OOB activity at this time due to generalized weakness and impaired endurance. Pt cites pain with breathing resulting in SOB, PT encourages deep breathing to continue expanding lungs. Pt will benefit from continued acute PT POC to improve gait, strength, balance, and to reduce falls risk. PT updating discharge recommendations to CIR due to patient confusion and continued gait and mobility deficits which place the pt at an increased falls risk.   Follow Up Recommendations  CIR;Supervision/Assistance - 24 hour(HHPT if family/patient decide to D/C home)     Equipment Recommendations  (defer to post-acute setting)    Recommendations for Other Services       Precautions / Restrictions Precautions Precautions: Fall;Other (comment) Precaution Comments: R chest tube Restrictions Weight Bearing Restrictions: No    Mobility  Bed Mobility Overal bed mobility: Needs Assistance Bed Mobility: Supine to Sit     Supine to sit: Mod assist;+2 for physical assistance        Transfers Overall transfer level: Needs assistance Equipment used: Rolling walker (2 wheeled) Transfers: Sit to/from Stand Sit to Stand: Min assist;+2 physical assistance          General transfer comment: cues for hand placement  Ambulation/Gait Ambulation/Gait assistance: Min assist;+2 safety/equipment Gait Distance (Feet): 60 Feet Assistive device: Rolling walker (2 wheeled) Gait Pattern/deviations: Step-to pattern;Trunk flexed Gait velocity: decreased Gait velocity interpretation: <1.8 ft/sec, indicate of risk for recurrent falls General Gait Details: pt with short step to gait, slowed gait speed. Pt's dtr reports he walks with flexed trunk and downward gaze at baseline   Stairs             Wheelchair Mobility    Modified Rankin (Stroke Patients Only)       Balance Overall balance assessment: Needs assistance Sitting-balance support: Single extremity supported;Feet supported Sitting balance-Leahy Scale: Fair Sitting balance - Comments: minG-close supervision   Standing balance support: Bilateral upper extremity supported Standing balance-Leahy Scale: Fair Standing balance comment: minG with BUE support of RW. static standing balance only                            Cognition Arousal/Alertness: Awake/alert(intermittent lethargy) Behavior During Therapy: WFL for tasks assessed/performed Overall Cognitive Status: Impaired/Different from baseline Area of Impairment: Memory;Safety/judgement;Problem solving                     Memory: Decreased recall of precautions;Decreased short-term memory Following Commands: Follows one step commands consistently;Follows multi-step commands with increased time Safety/Judgement: Decreased awareness of safety;Decreased awareness of deficits   Problem Solving: Slow processing General Comments: pt disoriented to date      Exercises      General Comments General comments (skin integrity, edema, etc.): VSS, pt on 2L [place]. Dtr present and expressing concerns over  lethargy, level of function, and discharge plans      Pertinent Vitals/Pain Pain Assessment: Faces Faces Pain Scale: Hurts  even more Pain Location: R flank Pain Descriptors / Indicators: Guarding;Grimacing;Discomfort Pain Intervention(s): Limited activity within patient's tolerance    Home Living                      Prior Function            PT Goals (current goals can now be found in the care plan section) Acute Rehab PT Goals Patient Stated Goal: to return home Progress towards PT goals: Not progressing toward goals - comment(slow progress)    Frequency    Min 5X/week      PT Plan Discharge plan needs to be updated    Co-evaluation PT/OT/SLP Co-Evaluation/Treatment: Yes Reason for Co-Treatment: Complexity of the patient's impairments (multi-system involvement);Necessary to address cognition/behavior during functional activity;For patient/therapist safety;To address functional/ADL transfers PT goals addressed during session: Mobility/safety with mobility;Balance;Proper use of DME;Strengthening/ROM        AM-PAC PT "6 Clicks" Mobility   Outcome Measure  Help needed turning from your back to your side while in a flat bed without using bedrails?: A Lot Help needed moving from lying on your back to sitting on the side of a flat bed without using bedrails?: A Lot Help needed moving to and from a bed to a chair (including a wheelchair)?: A Little Help needed standing up from a chair using your arms (e.g., wheelchair or bedside chair)?: A Little Help needed to walk in hospital room?: A Little Help needed climbing 3-5 steps with a railing? : A Lot 6 Click Score: 15    End of Session Equipment Utilized During Treatment: Oxygen Activity Tolerance: Patient tolerated treatment well Patient left: in chair;with call bell/phone within reach;with chair alarm set;with family/visitor present Nurse Communication: Mobility status PT Visit Diagnosis: Muscle weakness (generalized) (M62.81);History of falling (Z91.81);Difficulty in walking, not elsewhere classified (R26.2)     Time:  [id number] PT Time Calculation (min) (ACUTE ONLY): 45 min  Charges:  $Gait Training: 8-22 mins $Therapeutic Activity: 8-22 mins                     Zenaida Niece, PT, DPT Acute Rehabilitation Pager: [phone]    Zenaida Niece 10/08/2019, 12:40 PM

## 2019-10-08 NOTE — Progress Notes (Signed)
Jane with Radiology called report on CXR:  Small apical pneumothorax slightly worse from 3/21.  Obie Dredge, PA advised.

## 2019-10-08 NOTE — Progress Notes (Signed)
Occupational Therapy Treatment Patient Details Name: Micheal Frank MRN: [id number] DOB: 08/08/1925 Today's Date: 10/08/2019    History of present illness 86 year old male who arrives with a history of chronic kidney disease, COPD, history of brain bleed, h/o epidural abscess, blood clots, memory loss, recent pneumonia, who fell at home. Daughter states that he was likely trying to close a curtain, fell in between some chairs, landing on his right side onto some chairs. CT scan--right hemopneumothorax, right ribs 3-6, 10-11 fractures. 3/17 rt chest tube; incr confusion/?sundowning   OT comments  Pt presents supine in bed agreeable to therapy session. Pt with some progress towards OT goals, though continues to have limitations due to impaired cognition, pain, weakness and decreased mobility status. Pt tolerating functional transfers and hallway level mobility using RW with overall minA (+2). Prior to mobility pt completing toileting/LB ADL, requiring overall maxA for ADL task completion. Pt on 2-4L O2 throughout session to maintain O2 sats >90%; requiring rest breaks during session but overall tolerating well. Discussed with pt/pt's daughter likely need for pt to require follow up rehab services prior to return home, both to progress pt towards his PLOF and to reduce caregiver burden after return home and pt's daughter open to idea, have updated discharge recommendations to reflect. Will continue to follow acutely for pt progress.    Follow Up Recommendations  CIR;Supervision/Assistance - 24 hour    Equipment Recommendations  Other (comment)(TBD)    Recommendations for Other Services Rehab consult    Precautions / Restrictions Precautions Precautions: Fall;Other (comment) Precaution Comments: R chest tube Restrictions Weight Bearing Restrictions: No       Mobility Bed Mobility Overal bed mobility: Needs Assistance Bed Mobility: Supine to Sit     Supine to sit: Mod assist;+2 for  physical assistance     General bed mobility comments: assist for trunk elevation  Transfers Overall transfer level: Needs assistance Equipment used: Rolling walker (2 wheeled) Transfers: Sit to/from Stand Sit to Stand: Min assist;+2 physical assistance         General transfer comment: cues for hand placement    Balance Overall balance assessment: Needs assistance Sitting-balance support: Single extremity supported;Feet supported Sitting balance-Leahy Scale: Fair Sitting balance - Comments: minG-close supervision   Standing balance support: Bilateral upper extremity supported Standing balance-Leahy Scale: Fair Standing balance comment: minG with BUE support of RW. static standing balance only                           ADL either performed or assessed with clinical judgement   ADL Overall ADL's : Needs assistance/impaired             Lower Body Bathing: Maximal assistance;Sit to/from stand   Upper Body Dressing : Minimal assistance;Sitting Upper Body Dressing Details (indicate cue type and reason): donning new gown Lower Body Dressing: Maximal assistance;Sit to/from stand Lower Body Dressing Details (indicate cue type and reason): assist to doff/don new socks Toilet Transfer: Minimal assistance;Stand-pivot;RW;BSC Toilet Transfer Details (indicate cue type and reason): use of BSC (vs ambulatory to bathroom) as pt actively having BM Toileting- Clothing Manipulation and Hygiene: Maximal assistance;Sit to/from stand;+2 for physical assistance;+2 for safety/equipment Toileting - Clothing Manipulation Details (indicate cue type and reason): pt requiring assist for pericare in standing     Functional mobility during ADLs: Minimal assistance;+2 for physical assistance;+2 for safety/equipment;Rolling walker  Cognition Arousal/Alertness: Awake/alert(intermittent lethargy) Behavior During Therapy: WFL for tasks  assessed/performed Overall Cognitive Status: Impaired/Different from baseline Area of Impairment: Memory;Safety/judgement;Problem solving                     Memory: Decreased recall of precautions;Decreased short-term memory Following Commands: Follows one step commands consistently;Follows multi-step commands with increased time Safety/Judgement: Decreased awareness of safety;Decreased awareness of deficits   Problem Solving: Slow processing General Comments: pt disoriented to date, often requires repetition as pt is [facility]        Exercises     Shoulder Instructions       General Comments VSS, pt on 2-4L [place]. Dtr present and expressing concerns over lethargy, level of function, and discharge plans. open to considering CIR if it is an option     Pertinent Vitals/ Pain       Pain Assessment: Faces Faces Pain Scale: Hurts even more Pain Location: R flank Pain Descriptors / Indicators: Guarding;Grimacing;Discomfort Pain Intervention(s): Limited activity within patient's tolerance;Monitored during session;Repositioned  Home Living                                          Prior Functioning/Environment              Frequency  Min 2X/week        Progress Toward Goals  OT Goals(current goals can now be found in the care plan section)  Progress towards OT goals: Progressing toward goals  Acute Rehab OT Goals Patient Stated Goal: to return home OT Goal Formulation: With patient Time For Goal Achievement: 10/16/19 Potential to Achieve Goals: Good  Plan Discharge plan needs to be updated    Co-evaluation    PT/OT/SLP Co-Evaluation/Treatment: Yes Reason for Co-Treatment: Complexity of the patient's impairments (multi-system involvement);Necessary to address cognition/behavior during functional activity;For patient/therapist safety;To address functional/ADL transfers   OT goals addressed during session: ADL's and self-care      AM-PAC OT "6  Clicks" Daily Activity     Outcome Measure   Help from another person eating meals?: None Help from another person taking care of personal grooming?: A Little Help from another person toileting, which includes using toliet, bedpan, or urinal?: A Lot Help from another person bathing (including washing, rinsing, drying)?: A Lot Help from another person to put on and taking off regular upper body clothing?: A Little Help from another person to put on and taking off regular lower body clothing?: Total 6 Click Score: 15    End of Session Equipment Utilized During Treatment: Oxygen;Rolling walker  OT Visit Diagnosis: Pain;Muscle weakness (generalized) (M62.81) Pain - Right/Left: Right Pain - part of body: (ribcage)   Activity Tolerance Patient tolerated treatment well   Patient Left in chair;with call bell/phone within reach;with chair alarm set;with family/visitor present   Nurse Communication Mobility status        Time: [id number] OT Time Calculation (min): 44 min  Charges: OT General Charges $OT Visit: 1 Visit OT Treatments $Self Care/Home Management : 8-22 mins  Lou Cal, OT Acute Rehabilitation Services Pager [phone] Office [phone]    Raymondo Band 10/08/2019, 5:30 PM

## 2019-10-08 NOTE — Progress Notes (Addendum)
   Trauma/Critical Care Follow Up Note  Subjective:    Overnight Issues: NAEON C/o chest soreness with inspiration. Very hard of hearing. Voiding and having BMs. Therapies at bedside now.  Objective:  Vital signs for last 24 hours: Temp:  [97.5 F (36.4 C)-98.5 F (36.9 C)] 98 F (36.7 C) (03/24 0810) Pulse Rate:  [72-95] 72 (03/24 0810) Resp:  [16-30] 16 (03/24 0810) BP: (81-103)/(48-59) 102/59 (03/24 0810) SpO2:  [97 %-100 %] 99 % (03/24 0810)  Intake/Output from previous day: 03/23 0701 - 03/24 0700 In: 2307.5 [P.O.:360; I.V.:1895.4] Out: 810 [Urine:600; Chest Tube:210]  Intake/Output this shift: Total I/O In: 50 [P.O.:50] Out: -   Physical Exam:  Gen: comfortable, no distress Neuro: non-focal exam HEENT: pupils equal, anicteric sclerae, HOH  Neck: supple CV: RRR Pulm: unlabored breathing, R chest tube in place 210cc o/p on WS Abd: soft, NT GU: clear yellow urine, condom cath Extr: wwp, no edema  No results found for this or any previous visit (from the past 24 hour(s)).  Assessment & Plan: The plan of care was discussed with the bedside nurse for the day who is in agreement with this plan and no additional concerns were raised.   Present on Admission: . Pneumothorax on right    LOS: 7 days   Additional comments:I reviewed the patient's new clinical lab test results.   and I reviewed the patients new imaging test results.    53M fall Right 3-6, 10-11 rib fractures- epidural out. Continue multimodal pain control Right hemopneumothorax- CXR with slight increase in PTX >> put CT to -40cm suction.Robitussin for secretions. Pulm toilet, IS, flutter valve.  Chronic kidney disease/urinary retention-cr ~1.5 at baseline, foley out History of COPD - continue home nebulizers and medications History of multiple basal cell carcinomas, melanoma RLS- cont requip FEN -Regular diet, ensure, saline lock IV and encourage PO fluids, will give IVF bolus PRN.  VTE  -heparin subq ID -none currently Pain - scheduled tylenol, scheduled robaxin, scheduled tramadol, PRN oxycodone, PRN HM  Dispo - PT/OT rec HH, home when chest tube out, repeat CXR in [place], [facility] [phone]  10/08/2019

## 2019-10-08 NOTE — TOC Progression Note (Signed)
Transition of Care (TOC) - Progression Note  Marvetta Gibbons RN,BSN Transitions of Care Unit 4NP (non trauma) - RN Case Manager [phone]   Patient Details  Name: TYAIR LOHMEYER MRN: [id number] Date of Birth: Jun 06, 1926  Transition of [facility]) CM/SW Contact  Dahlia Client Romeo Rabon, RN Phone Number: 10/08/2019, 2:08 PM  Clinical Narrative:    Informed by bedside RN that daughter requesting to speak with TOC regarding transition of care questions. In to speak with daughter to answer questions. Daughter asking about HH vs Rehab options at this point. As well discussed private duty assistance using pt's LT care plan that daughter has already check in to and spoken with someone about with the insurance plan. Pt is also [facility] A[employer]- and will check to see if he as any benefits with [facility] that can be of benefit if pt transitions home. PT now updated recommendation for CIR, will follow for pt. Progression and appropriateness for rehab vs alternate transition plan. Call made to [place] with [facility]- pt would be eligible for [facility] community CM follow up, she will check to see if insurance plan has any other benefits for addition services and let TOC know if he does.    Expected Discharge Plan: [place] Barriers to Discharge: Continued Medical Work up  Expected Discharge Plan and Services Expected Discharge Plan: [place]   Discharge Planning Services: CM Consult Post Acute Care Choice: [place] arrangements for the past 2 months: Single Family Home                           HH Arranged: PT, OT [place] Agency: [facility]now [facility] Date [place]: 10/02/19   Representative spoke with at [place]: [place] Interventions    Readmission Risk Interventions No flowsheet data found.

## 2019-10-09 ENCOUNTER — Inpatient Hospital Stay (HOSPITAL_COMMUNITY): Payer: Medicare Other

## 2019-10-09 LAB — BASIC METABOLIC PANEL
Anion gap: 11 (ref 5–15)
BUN: 26 mg/dL — ABNORMAL HIGH (ref 8–23)
CO2: 21 mmol/L — ABNORMAL LOW (ref 22–32)
Calcium: 8.5 mg/dL — ABNORMAL LOW (ref 8.9–10.3)
Chloride: 104 mmol/L (ref 98–111)
Creatinine, Ser: 1.44 mg/dL — ABNORMAL HIGH (ref 0.61–1.24)
GFR calc Af Amer: 49 mL/min — ABNORMAL LOW (ref >60)
GFR calc non Af Amer: 42 mL/min — ABNORMAL LOW (ref >60)
Glucose, Bld: 106 mg/dL — ABNORMAL HIGH (ref 70–99)
Potassium: 3.9 mmol/L (ref 3.5–5.1)
Sodium: 136 mmol/L (ref 135–145)

## 2019-10-09 NOTE — Plan of Care (Signed)
  Problem: Skin Integrity: Goal: Risk for impaired skin integrity will decrease Note: Patient with low braden score. Patient has been educated on importance of, and methods to, maintain integrity of skin and mucous membranes and explained on turning schedule and why RN and staff will be performed Q2 hours turns while in bed for pressure re-distribution. Prophylactic dressing such as alleyvn foam dressings applied to sacral aea an heels for ulcer prevention as patient is high risk. HOB is at about 30-45 degrees. Sheets are dry and wrinkle free with one incontinence sheet. Patient hygiene care performed daily and as needed. Please see notes and flowsheets for additional details on interactions.

## 2019-10-09 NOTE — Progress Notes (Signed)
   Trauma/Critical Care Follow Up Note  Subjective:    Overnight Issues: NAEON Resting comfortably in bed this AM.  CT - 120 cc/24h SS CXR pending this morning  Objective:  Vital signs for last 24 hours: Temp:  [97.5 F (36.4 C)-99.6 F (37.6 C)] 99.6 F (37.6 C) (03/25 0326) Pulse Rate:  [72-97] 83 (03/25 0600) Resp:  [15-27] 15 (03/25 0600) BP: (95-107)/(51-65) 101/51 (03/25 0400) SpO2:  [92 %-100 %] 94 % (03/25 0600)  Intake/Output from previous day: 03/24 0701 - 03/25 0700 In: 270 [P.O.:270] Out: 820 [Urine:700; Chest Tube:120]  Intake/Output this shift: No intake/output data recorded.  Physical Exam:  Gen: comfortable, no distress Neuro: non-focal exam HEENT: pupils equal, anicteric sclerae, HOH  Neck: supple CV: RRR Pulm: unlabored breathing, R chest tube in place 120 cc o/p on -40cm Abd: soft, NT GU: clear yellow urine, condom cath Extr: wwp, no edema  Results for orders placed or performed during the hospital encounter of 09/30/19 (from the past 24 hour(s))  Basic metabolic panel     Status: Abnormal   Collection Time: 10/09/19  3:49 AM  Result Value Ref Range   Sodium 136 135 - 145 mmol/L   Potassium 3.9 3.5 - 5.1 mmol/L   Chloride 104 98 - 111 mmol/L   CO2 21 (L) 22 - 32 mmol/L   Glucose, Bld 106 (H) 70 - 99 mg/dL   BUN 26 (H) 8 - 23 mg/dL   Creatinine, Ser 1.44 (H) 0.61 - 1.24 mg/dL   Calcium 8.5 (L) 8.9 - 10.3 mg/dL   GFR calc non Af Amer 42 (L) >60 mL/min   GFR calc Af Amer 49 (L) >60 mL/min   Anion gap 11 5 - 15    Assessment & Plan: The plan of care was discussed with the bedside nurse for the day who is in agreement with this plan and no additional concerns were raised.   Present on Admission: . Pneumothorax on right    LOS: 8 days   Additional comments:I reviewed the patient's new clinical lab test results.   and I reviewed the patients new imaging test results.    [name] fall Right 3-6, 10-11 rib fractures- epidural out. Continue  multimodal pain control Right hemopneumothorax- CXR 3/24 with slight increase in PT. Continue -40cm suction at least another 24 hours. Bedside betadine pleurodesis 3/26 if persistent PTX. Robitussin for secretions. Pulm toilet, IS, flutter valve.  Chronic kidney disease/urinary retention-cr ~1.5 at baseline, foley out History of COPD - continue home nebulizers and medications History of multiple basal cell carcinomas, melanoma RLS- cont requip FEN -Regular diet, ensure, encourage PO fluids, will give IVF bolus PRN.  VTE -heparin subq ID -none currently Pain - scheduled tylenol, scheduled robaxin, scheduled tramadol, PRN oxycodone, PRN HM  Dispo - PT/OT rec HH, home when chest tube out, repeat CXR in [place], [facility] [phone]  10/09/2019

## 2019-10-09 NOTE — Progress Notes (Signed)
Physical Therapy Treatment Patient Details Name: Micheal Frank MRN: [id number] DOB: 1926-06-10 Today's Date: 10/09/2019    History of Present Illness 86 year old male who arrives with a history of chronic kidney disease, COPD, history of brain bleed, h/o epidural abscess, blood clots, memory loss, recent pneumonia, who fell at home. Daughter states that he was likely trying to close a curtain, fell in between some chairs, landing on his right side onto some chairs. CT scan--right hemopneumothorax, right ribs 3-6, 10-11 fractures. 3/17 rt chest tube; incr confusion/?sundowning    PT Comments    Pt limited significantly by pain this session. Pt demonstrating reduced tolerance for ambulation and transfer activities. Pt performing bed mobility to assist in perineal hygiene, and citing significant pain at scrotum and gluteal cleft areas. Pt with noted labored breathing despite being on 3L [place] during session, unreliable pleth reading at times due to poor stick of pulse oximeter. Pt remains confused and requires significant verbal and tactile cueing to ensure safety and guide mobility. PT updating discharge recommendations to SNF as CIR not pursuing rehab consult at this time and pt with reduced activity tolerance this session.   Follow Up Recommendations  SNF;Supervision/Assistance - 24 hour(HHPT if family declines placement)     Equipment Recommendations  (defer to post-acute setting)    Recommendations for Other Services       Precautions / Restrictions Precautions Precautions: Fall;Other (comment) Precaution Comments: R chest tube Restrictions Weight Bearing Restrictions: No    Mobility  Bed Mobility Overal bed mobility: Needs Assistance Bed Mobility: Rolling;Sidelying to Sit Rolling: Min assist Sidelying to sit: Min guard          Transfers Overall transfer level: Needs assistance Equipment used: Rolling walker (2 wheeled) Transfers: Sit to/from Stand Sit to Stand: Min  assist            Ambulation/Gait Ambulation/Gait assistance: Herbalist (Feet): 7 Feet Assistive device: Rolling walker (2 wheeled) Gait Pattern/deviations: Step-to pattern;Trunk flexed Gait velocity: reduced Gait velocity interpretation: <1.31 ft/sec, indicative of household ambulator General Gait Details: pt with short step to gait, reduced foot clearance bilaterally   Stairs             Wheelchair Mobility    Modified Rankin (Stroke Patients Only)       Balance Overall balance assessment: Needs assistance Sitting-balance support: Single extremity supported;Feet supported Sitting balance-Leahy Scale: Fair Sitting balance - Comments: minG with UE support of bed   Standing balance support: Bilateral upper extremity supported Standing balance-Leahy Scale: Fair Standing balance comment: minG with BUE support of RW                            Cognition Arousal/Alertness: Awake/alert Behavior During Therapy: Restless Overall Cognitive Status: Impaired/Different from baseline Area of Impairment: Following commands;Memory;Safety/judgement;Problem solving                     Memory: Decreased recall of precautions;Decreased short-term memory Following Commands: Follows one step commands consistently;Follows one step commands with increased time Safety/Judgement: Decreased awareness of safety;Decreased awareness of deficits   Problem Solving: Slow processing;Requires verbal cues;Requires tactile cues        Exercises      General Comments General comments (skin integrity, edema, etc.): pt on 3L [place] during session, with labored breathign at times, significantly limited by pain during session      Pertinent Vitals/Pain Pain Assessment: Faces Faces Pain Scale: Hurts whole  lot Pain Location: R flank, scrotum, abdomen Pain Descriptors / Indicators: Grimacing;Guarding;Moaning Pain Intervention(s): Limited activity within  patient's tolerance;Patient requesting pain meds-RN notified    Home Living                      Prior Function            PT Goals (current goals can now be found in the care plan section) Acute Rehab PT Goals Patient Stated Goal: to return home Progress towards PT goals: Not progressing toward goals - comment(limited by pain)    Frequency    Min 4X/week      PT Plan Discharge plan needs to be updated    Co-evaluation              AM-PAC PT "6 Clicks" Mobility   Outcome Measure  Help needed turning from your back to your side while in a flat bed without using bedrails?: A Little Help needed moving from lying on your back to sitting on the side of a flat bed without using bedrails?: A Little Help needed moving to and from a bed to a chair (including a wheelchair)?: A Little Help needed standing up from a chair using your arms (e.g., wheelchair or bedside chair)?: A Little Help needed to walk in hospital room?: A Lot Help needed climbing 3-5 steps with a railing? : Total 6 Click Score: 15    End of Session Equipment Utilized During Treatment: Oxygen Activity Tolerance: Patient limited by pain Patient left: in chair;with call bell/phone within reach;with family/visitor present Nurse Communication: Mobility status PT Visit Diagnosis: Muscle weakness (generalized) (M62.81);History of falling (Z91.81);Difficulty in walking, not elsewhere classified (R26.2)     Time: [id number] PT Time Calculation (min) (ACUTE ONLY): 40 min  Charges:  $Gait Training: 8-22 mins $Therapeutic Activity: 23-37 mins                     Zenaida Niece, PT, DPT Acute Rehabilitation Pager: [phone]    Zenaida Niece 10/09/2019, 12:56 PM

## 2019-10-10 ENCOUNTER — Inpatient Hospital Stay (HOSPITAL_COMMUNITY): Payer: Medicare Other

## 2019-10-10 NOTE — Progress Notes (Signed)
Patient ID: Micheal Frank, male   DOB: June 17, 1926, 86 y.o.   MRN: [id number]       Subjective: Patient having more pain today and doesn't understand why he can't go home yet.  Having some abdominal pain today on the side of his large flank hematoma.  Drank and ensure this morning per daughter, but isn't eating any solid food.  Becoming much weaker and doing less and less with therapies.  ROS: See above, otherwise other systems negative  Objective: Vital signs in last 24 hours: Temp:  [97.6 F (36.4 C)-99.6 F (37.6 C)] 97.8 F (36.6 C) (03/26 1126) Pulse Rate:  [62-96] 78 (03/26 1126) Resp:  [18-31] 18 (03/26 1126) BP: (90-109)/(47-75) 100/53 (03/26 1126) SpO2:  [90 %-99 %] 99 % (03/26 1126) FiO2 (%):  [31 %] 31 % (03/26 0755) Weight:  [64.3 kg] 64.3 kg (03/25 2205) Last BM Date: 10/09/19  Intake/Output from previous day: 03/25 0701 - 03/26 0700 In: 340 [P.O.:340] Out: 450 [Urine:450] Intake/Output this shift: Total I/O In: 150 [P.O.:150] Out: -   PE: Gen: laying in bed and c/o pain today Neuro: non-focal exam HEENT: pupils equal, HOH  Neck: supple CV: RRR Pulm: unlabored breathing, R chest tube in place still on -40cm of suction.  No airleak.  Decrease BS throughout Abd: soft, mild Right sided tenderness GU: clear yellow urine, condom cath Extr: wwp, no edema  Lab Results:  No results for input(s): WBC, HGB, HCT, PLT in the last 72 hours. BMET Recent Labs    10/09/19 0349  NA 136  K 3.9  CL 104  CO2 21*  GLUCOSE 106*  BUN 26*  CREATININE 1.44*  CALCIUM 8.5*   PT/INR No results for input(s): LABPROT, INR in the last 72 hours. CMP     Component Value Date/Time   NA 136 10/09/2019 0349   K 3.9 10/09/2019 0349   CL 104 10/09/2019 0349   CO2 21 (L) 10/09/2019 0349   GLUCOSE 106 (H) 10/09/2019 0349   BUN 26 (H) 10/09/2019 0349   CREATININE 1.44 (H) 10/09/2019 0349   CREATININE 1.27 07/24/2011 1133   CALCIUM 8.5 (L) 10/09/2019 0349   PROT 6.6  10/16/2018 1200   ALBUMIN 3.6 10/16/2018 1200   AST 21 10/16/2018 1200   ALT 9 10/16/2018 1200   ALKPHOS 71 10/16/2018 1200   BILITOT 0.8 10/16/2018 1200   GFRNONAA 42 (L) 10/09/2019 0349   GFRAA 49 (L) 10/09/2019 0349   Lipase     Component Value Date/Time   LIPASE 20 07/23/2017 1145       Studies/Results: DG CHEST PORT 1 VIEW  Result Date: 10/10/2019 CLINICAL DATA:  Recent right-sided pneumothorax. EXAM: PORTABLE CHEST 1 VIEW COMPARISON:  October 09, 2019 FINDINGS: Chest tube position on the right unchanged. There is a pleural effusion on the right with loculated components. There is fluid in the right apex. Apparent small pneumothorax remains in this area surrounded by fluid. Elsewhere, there is atelectatic change in the right mid lung and right base regions. There is also atelectasis in the left lung. Left lung otherwise is clear. Heart is upper normal in size with pulmonary vascularity normal. There is aortic atherosclerosis. No adenopathy. Bones are osteoporotic with arthropathy in each shoulder. Subcutaneous air remains on the right laterally. IMPRESSION: No change in chest tube position on the right. Subcutaneous air again noted on the right. Small right apical pneumothorax present with new right pleural effusion; pleural fluid is noted adjacent to the apical pneumothorax on  the right. Elsewhere, areas of patchy atelectasis noted. No frank airspace consolidation. Stable cardiac prominence.  Aortic Atherosclerosis (ICD10-I70.0). Electronically Signed   By: Lowella Grip III M.D.   On: 10/10/2019 10:59   DG CHEST PORT 1 VIEW  Result Date: 10/09/2019 CLINICAL DATA:  Shortness of breath EXAM: PORTABLE CHEST 1 VIEW COMPARISON:  October 08, 2019 FINDINGS: The heart size and mediastinal contours are stable. Right chest tube is identified unchanged. There is a small right apical pneumothorax unchanged. Small right pleural effusion is identified. Mild increased bilateral pulmonary  interstitium is unchanged. The visualized skeletal structures are stable. IMPRESSION: Small right apical pneumothorax unchanged compared to prior exam. Small right pleural effusion. Electronically Signed   By: Abelardo Diesel M.D.   On: 10/09/2019 08:30   DG CHEST PORT 1 VIEW  Result Date: 10/08/2019 CLINICAL DATA:  Right-sided hemothorax, chest tube. EXAM: PORTABLE CHEST 1 VIEW COMPARISON:  10/07/2019 and 10/05/2019 FINDINGS: Right-sided chest tube has been pulled back slightly, all remains in adequate position. There is a small right apical pneumothorax with slight worsening compared to 10/05/2019. Minimal diffuse bilateral interstitial prominence right worse than left with minimal stable bibasilar opacification likely atelectasis with possible small amount right pleural fluid. Cardiomediastinal silhouette and remainder of the exam is unchanged. IMPRESSION: 1. Right-sided chest tube has been pulled back slightly, although it remains in adequate position. Small right apical pneumothorax slightly worse compared to 10/05/2019. 2. Minimal diffuse interstitial prominence right worse than left with stable mild bibasilar opacification likely atelectasis with small amount right pleural fluid. These results will be called to the ordering clinician or representative by the Radiologist Assistant, and communication documented in the PACS or [employer]. Electronically Signed   By: Marin Olp M.D.   On: 10/08/2019 12:32    Anti-infectives: Anti-infectives (From admission, onward)   None       Assessment/Plan 69M fall Right 3-6, 10-11 rib fractures- epidural out. Continue multimodal pain control Right hemopneumothorax- CXR still with persistent apical despite -40 of suction.  Likely going to start transitioning to comfort Chronic kidney disease/urinary retention-cr ~1.5 at baseline, foley out History of COPD - continue home nebulizers and medications History of multiple basal cell carcinomas,  melanoma RLS- cont requip FEN -Regular diet, ensure, encourage PO fluids, will give IVF bolus PRN.  VTE -heparinsubq ID -none currently Pain - scheduled tylenol, scheduled robaxin, scheduled tramadol, PRN oxycodone, PRN HM Dispo - palliative consult, but likely transition to comfort   LOS: 9 days    Henreitta Cea , [facility] 10/10/2019, 11:45 AM Please see Amion for pager number during day hours 7:00am-4:30pm or 7:00am -11:30am on weekends

## 2019-10-10 NOTE — Progress Notes (Signed)
PT Cancellation Note  Patient Details Name: Micheal Frank MRN: [id number] DOB: 1926-07-14   Cancelled Treatment:    Reason Eval/Treat Not Completed: Patient declined, no reason specified;Other (comment). PT attempted to see patient for treatment today. Upon arrival patient's family reporting that they are transitioning to comfort measures and no longer desire continued therapy services at this time. Acute PT will sign off.   Zenaida Niece 10/10/2019, 2:48 PM

## 2019-10-10 NOTE — Progress Notes (Signed)
OT Cancellation Note  Patient Details Name: Micheal Frank MRN: [id number] DOB: 11-07-1925   Cancelled Treatment:    Reason Eval/Treat Not Completed: Other (comment)(Transitioning to comfort care.) Patient's family reporting that they are transitioning to comfort measures and no longer desire continued therapy services at this time. Will sign off. Thank you.  [place], OTR/L Acute Rehab Pager: [phone] Office: [phone] 10/10/2019, 3:00 PM

## 2019-10-10 NOTE — TOC Progression Note (Signed)
Transition of Care (TOC) - Progression Note  Marvetta Gibbons RN,BSN Transitions of Care Unit 4NP (non trauma) - RN Case Manager [phone]   Patient Details  Name: Micheal Frank MRN: [id number] Date of Birth: [birth year]  Transition of Care [facility]) CM/SW Contact  Dahlia Client, Romeo Rabon, RN Phone Number: 10/10/2019, 2:55 PM  Clinical Narrative:    During progression bedside RN informed CM that daughter wanted to speak with CM again for further questions. Stopped by room and spoke with daughter [name] at the bedside- answered further questions regarding PC vs Hospice services at home and how coverage for DME works under each. Per daughter they have used [facility] now known as [facility] in the past. Pt has hospital bed at home already but may need w/c or home 02 pending progression. PC consult has been placed for GOC.  1500- noted referral placed per MD for home hospice- PC consult still pending, again spoke with daughter [name] and her sister at the bedside. Discussed the pending PC referral- they are hopeful to meet with [facility] tomorrow. Asked if they would like CM to go ahead and contact a Hospice agency- they responded that they would in order be able to ask some questions. Choice discussed and offered (declined a list) went over that choice was theirs as to what Hospice agency they wanted to use- daughters would like to stay with [facility] as they were pleased with them when they used them in the past- clarified that they did not want to change to [facility] [place]- choice per both daughters is [facility]. \ Call made to Venia Carbon with [facility]- for Home Hospice referral- pending PC consult. Anderson Malta will f/u and speak with the daughters to answer questions.    Expected Discharge Plan: [place] Barriers to Discharge: Continued Medical Work up  Expected Discharge Plan and Services Expected Discharge Plan: [place]   Discharge Planning Services:  CM Consult Post Acute Care Choice: [place] arrangements for the past 2 months: Single Family Home                           HH Arranged: PT, OT [place] Agency: [facility] Date [place]: 10/02/19   Representative spoke with at [place]: [name]) Interventions    Readmission Risk Interventions No flowsheet data found.

## 2019-10-10 NOTE — Progress Notes (Signed)
Discussions had throughout the day with the daughters and the patient.  He is being transitioned to comfort care with hospice at home.  Hospice is anticipating a discharge on Sunday to home.  I have removed the patient's chest tube this afternoon with no issues to give him more comfort.  After removal he states he already feels better.  I did discuss with the family that in removing the CT, with his PTX not being resolved, that he has a chance that this could worsen and make him more ill, but that we would keep him comfortable with the aim of discharge on Sunday.  They understand and agree with all.  Henreitta Cea 4:20 PM 10/10/2019

## 2019-10-10 NOTE — Progress Notes (Signed)
[facility])  Referral received for hospice services at home once discharged.  Contacted dtr and POA [name], she advised they were working with [place] to get their father home.  Thank you, Venia Carbon RN, BSN, [place] [facility] Liaison

## 2019-10-11 DIAGNOSIS — Z7189 Other specified counseling: Secondary | ICD-10-CM

## 2019-10-11 DIAGNOSIS — J432 Centrilobular emphysema: Secondary | ICD-10-CM

## 2019-10-11 DIAGNOSIS — Z515 Encounter for palliative care: Secondary | ICD-10-CM

## 2019-10-11 DIAGNOSIS — J942 Hemothorax: Secondary | ICD-10-CM

## 2019-10-11 DIAGNOSIS — S2241XA Multiple fractures of ribs, right side, initial encounter for closed fracture: Secondary | ICD-10-CM

## 2019-10-11 LAB — GLUCOSE, CAPILLARY: Glucose-Capillary: 99 mg/dL (ref 70–99)

## 2019-10-11 NOTE — Progress Notes (Signed)
2334: Daughter called for update on patient. No changes.

## 2019-10-11 NOTE — Progress Notes (Signed)
0004: Spoke w/ daughter and gave update. No changes.

## 2019-10-11 NOTE — Plan of Care (Signed)

## 2019-10-11 NOTE — Progress Notes (Signed)
   Subjective/Chief Complaint: He is awake this morning and talking with a family member Denies pain or SOB   Objective: Vital signs in last 24 hours: Temp:  [97.7 F (36.5 C)-98.7 F (37.1 C)] 98.4 F (36.9 C) (03/27 0911) Pulse Rate:  [78-86] 83 (03/27 0911) Resp:  [16-20] 16 (03/27 0911) BP: (92-117)/(48-57) 92/49 (03/27 0911) SpO2:  [91 %-99 %] 91 % (03/27 0911) FiO2 (%):  [5 %] 5 % (03/27 0001) Last BM Date: 10/09/19  Intake/Output from previous day: 03/26 0701 - 03/27 0700 In: 150 [P.O.:150] Out: 575 [Urine:575] Intake/Output this shift: Total I/O In: 120 [P.O.:120] Out: -   Exam: Awake, following commands, drinking fluid  Lab Results:  No results for input(s): WBC, HGB, HCT, PLT in the last 72 hours. BMET Recent Labs    10/09/19 0349  NA 136  K 3.9  CL 104  CO2 21*  GLUCOSE 106*  BUN 26*  CREATININE 1.44*  CALCIUM 8.5*   PT/INR No results for input(s): LABPROT, INR in the last 72 hours. ABG No results for input(s): PHART, HCO3 in the last 72 hours.  Invalid input(s): PCO2, PO2  Studies/Results: DG CHEST PORT 1 VIEW  Result Date: 10/10/2019 CLINICAL DATA:  Recent right-sided pneumothorax. EXAM: PORTABLE CHEST 1 VIEW COMPARISON:  October 09, 2019 FINDINGS: Chest tube position on the right unchanged. There is a pleural effusion on the right with loculated components. There is fluid in the right apex. Apparent small pneumothorax remains in this area surrounded by fluid. Elsewhere, there is atelectatic change in the right mid lung and right base regions. There is also atelectasis in the left lung. Left lung otherwise is clear. Heart is upper normal in size with pulmonary vascularity normal. There is aortic atherosclerosis. No adenopathy. Bones are osteoporotic with arthropathy in each shoulder. Subcutaneous air remains on the right laterally. IMPRESSION: No change in chest tube position on the right. Subcutaneous air again noted on the right. Small right  apical pneumothorax present with new right pleural effusion; pleural fluid is noted adjacent to the apical pneumothorax on the right. Elsewhere, areas of patchy atelectasis noted. No frank airspace consolidation. Stable cardiac prominence.  Aortic Atherosclerosis (ICD10-I70.0). Electronically Signed   By: Lowella Grip III M.D.   On: 10/10/2019 10:59    Anti-infectives: Anti-infectives (From admission, onward)   None      Assessment/Plan: 61M fall Right 3-6, 10-11 rib fractures- stable Right hemopneumothorax-chest tube out Chronic kidney disease/urinary retention-cr ~1.5 at baseline, foley out History of COPD - continue home nebulizers and medications History of multiple basal cell carcinomas, melanoma RLS- cont requip FEN -Regular diet, ensure VTE -heparinsubq ID -none currently Pain - scheduled tylenol, scheduled robaxin, scheduled tramadol, PRN oxycodone, PRN HM Dispo - likely home tomorrow with hospice   LOS: 10 days    Coralie Keens 10/11/2019

## 2019-10-11 NOTE — Consult Note (Signed)
Consultation Note Date: 10/11/2019   Patient Name: Micheal Frank  DOB: 1925/12/16  MRN: [id number]  Age / Sex: 86 y.o., male  PCP: Colon Branch, MD Referring Physician: Md, Trauma, MD  Reason for Consultation: Establishing goals of care  HPI/Patient Profile: 86 y.o. male  with past medical history of CKD, COPD, brain bleed, recent pneumonia  admitted on 09/30/2019 after falling at home. Workup revealed R hemopnuemothorax, likely subpleural laceration in R middle lobe, minmally displaced fractures of the 3-6 ribs, and highly comminuted fractures of the 10th-11th ribs in the setting of severe emphysema. During admission he has failed to progress, is not eating or drinking much, sleeping most of the day. Chest tube has been removed. Per discussion with attending team patient transitioned to focus on comfort care and referred to Hospice services at home. Palliative consulted for home hospice discussion.  Clinical Assessment and Goals of Care:  I have reviewed medical records including EPIC notes, labs and imaging, examined the patient and met at bedside with his daughterJeani Frank  to discuss diagnosis prognosis, [place], EOL wishes, disposition and options.  We discussed a brief life review of the patient. He is retired from working as an Chief Financial Officer. Widowed. Lives at home with his daughter.   As far as functional and nutritional status- prior to admission he was pretty independent- able to ambulate in the home. Now is not eating or drinking, is incontinent, requiring total care.    We discussed his current illness and what it means in the larger context of his on-going co-morbidities.  Natural disease trajectory and expectations at EOL were discussed. Micheal Frank notes they have been through this process with their Mom. Micheal Frank knows that patient values his independence and would prefer comfort over other invasive measures and is not  a candidate for surgery.  The difference between aggressive medical intervention and comfort care was considered in light of the patient's goals of care. Micheal Frank was concerned about the high level of oxygen and need for concentrator in the home. She is hoping he will be able to transition down to more portable oxygen unit. We discussed the use of opioids for relief from SOB or air hunger vs increasing oxygen liters.   Per Micheal Frank patient's pain and SOB seem to be well controlled with current regimen. She notes he has good clear moments in the mornings, then tends to sleep for the rest of the day.   Hospice has been in contact with family. They are utilizing services of [facility]- equipment is being delivered to the home today and they are hopeful for discharge home tomorrow.   Questions and concerns were addressed.  The family was encouraged to call with questions or concerns.   Primary Decision Maker NEXT OF KIN- patient's children- Micheal Frank is HCPOA    SUMMARY OF RECOMMENDATIONS -Plan for discharge home tomorrow with Hospice -Continue current symptom management regimen     Code Status/Advance Care Planning:  DNR  Additional Recommendations (Limitations, Scope, Preferences):  Full Comfort Care  Prognosis:    < 4 weeks due to traumatic fall with resulting rib fractures and large hemothorax in the setting of severe emphysema affecting patient's ability to ambulate or thrive  Discharge Planning: Home with Hospice  Primary Diagnoses: Present on Admission: . Pneumothorax on right   I have reviewed the medical record, interviewed the patient and family, and examined the patient. The following aspects are pertinent.  Past Medical History:  Diagnosis Date  . Arthritis    left leg also has swelling  . Asthma   . Blood dyscrasia    pt CAN NOT have any blood thinners d/t hx brain bleed  . Chronic kidney disease, stage III (moderate) 01/20/2013  . Complication of anesthesia     confusion with anesthesia  . Constipation    miralax prn  . COPD (chronic obstructive pulmonary disease) (HCC)    emphysema  . Depression    takes wellbutrin bid  . Epidural abscess   . Gastric ulcer   . History of blood clots   . Hyperlipidemia   . Melanoma ([place])   . Peripheral neuropathy   . Pneumonia    as a child and in 1952;had a pneumonia vaccine couple of years ago  . Prosthetic joint infection ([place])   . S/P right heart catheterization    at least 68yr  . Shingles   . Short-term memory loss   . Shortness of breath    with exertion  . Sleep apnea    has CPAP but doesn't use it;sleep study done at least 466yrago  . Staphylococcus aureus bacteremia with sepsis ([facility]  . Subdural [place]oma ([place]  . Urinary frequency    wears depends   Social History   Socioeconomic History  . Marital status: Widowed    Spouse name: Not on file  . Number of children: 3  . Years of education: Not on file  . Highest education level: Not on file  Occupational History  . Occupation: retired  Tobacco Use  . Smoking status: Former Smoker    Packs/day: 2.00    Years: 40.00    Pack years: 80.00    Types: Cigarettes    Quit date: 07/17/1978    Years since quitting: 41.2  . Smokeless tobacco: Never Used  Substance and Sexual Activity  . Alcohol use: No  . Drug use: No  . Sexual activity: Not Currently  Other Topics Concern  . Not on file  Social History Narrative   Lost wife 06-2013   Daughters: Micheal Frank Son:  Micheal Frank  Lives in his house, w/  daughter (SManuela Schwartzand her 2 adult children live w/ him (1g-child w/ special needs)   Social Determinants of Health   Financial Resource Strain:   . Difficulty of Paying Living Expenses:   Food Insecurity:   . Worried About RuCharity fundraisern the Last Year:   . RaArboriculturistn the Last Year:   Transportation Needs:   . LaFilm/video editorMedical):   . Marland Kitchenack of Transportation (Non-Medical):   Physical Activity:    . Days of Exercise per Week:   . Minutes of Exercise per Session:   Stress:   . Feeling of Stress :   Social Connections:   . Frequency of Communication with Friends and Family:   . Frequency of Social Gatherings with Friends and Family:   . Attends Religious Services:   . Active Member of Clubs or Organizations:   .  Attends Archivist Meetings:   Marland Kitchen Marital Status:    Family History  Problem Relation Age of Onset  . Melanoma Brother   . CAD Neg Hx   . Diabetes Neg Hx    Scheduled Meds: . acetaminophen  1,000 mg Oral Q6H  . bisacodyl  10 mg Rectal Q0600  . buPROPion  200 mg Oral BID  . docusate sodium  100 mg Oral BID  . doxazosin  4 mg Oral QHS  . famotidine  20 mg Oral Daily  . feeding supplement (ENSURE ENLIVE)  237 mL Oral TID WC  . Gerhardt's butt cream   Topical BID  . guaiFENesin  10 mL Oral Q6H  . lactose free nutrition  237 mL Oral TID BM  . methocarbamol  1,000 mg Oral Q8H  . mometasone-formoterol  2 puff Inhalation BID  . montelukast  10 mg Oral Daily  . polyethylene glycol  17 g Oral Daily  . rOPINIRole  1 mg Oral Daily  . rOPINIRole  2 mg Oral q1800  . senna-docusate  1 tablet Oral BID  . tamsulosin  0.4 mg Oral Daily  . traMADol  25 mg Oral Q6H  . triamterene-hydrochlorothiazide  0.5 tablet Oral Daily   Continuous Infusions: . ropivacaine (PF) 2 mg/mL (0.2%) Stopped (10/06/19 0956)   PRN Meds:.albuterol, benzonatate, [DISCONTINUED] diphenhydrAMINE **OR** diphenhydrAMINE, haloperidol lactate, HYDROmorphone (DILAUDID) injection, meclizine, ondansetron (ZOFRAN) IV, oxyCODONE, [DISCONTINUED] naloxone **AND** sodium chloride flush Medications Prior to Admission:  Prior to Admission medications   Medication Sig Start Date End Date Taking? Authorizing Provider  acetaminophen (TYLENOL) 500 MG tablet Take 2 tablets (1,000 mg total) by mouth every 8 (eight) hours as needed for mild pain. 01/22/18  Yes Elgergawy, Silver Huguenin, MD  albuterol (PROAIR HFA) 108 (90  BASE) MCG/ACT inhaler Inhale 2 puffs into the lungs every 6 (six) hours as needed for wheezing. 07/31/14  Yes Elsie Stain, MD  benzonatate (TESSALON) 200 MG capsule Take 1 capsule (200 mg total) by mouth 3 (three) times daily as needed for cough. 08/26/19  Yes Paz, Alda Berthold, MD  budesonide-formoterol [facility]) 160-4.5 MCG/ACT inhaler Inhale 2 puffs into the lungs 2 (two) times daily. 07/05/17  Yes Paz, Alda Berthold, MD  buPROPion [facility] SR) 200 MG 12 hr tablet Take 1 tablet (200 mg total) by mouth 2 (two) times daily. 09/09/19  Yes Paz, Alda Berthold, MD  calcium carbonate (OS-CAL) 600 MG TABS Take 600 mg by mouth daily.    Yes [provider]  diazepam (VALIUM) 5 MG tablet Take 1 tablet (5 mg total) by mouth every 6 (six) hours as needed for anxiety. 08/15/18  Yes Paz, Alda Berthold, MD  doxazosin (CARDURA) 4 MG tablet Take 1 tablet (4 mg total) by mouth at bedtime. 09/17/19  Yes Paz, Alda Berthold, MD  famotidine (PEPCID AC) 10 MG tablet Take 1 tablet (10 mg total) by mouth 2 (two) times daily. 06/19/18  Yes Paz, Alda Berthold, MD  fluticasone [facility]) 50 MCG/ACT nasal spray Place 2 sprays into both nostrils 2 (two) times daily. 07/05/17  Yes Paz, Alda Berthold, MD  HYDROcodone-acetaminophen (NORCO/VICODIN) 5-325 MG tablet Take 1 tablet by mouth 2 (two) times daily as needed for moderate pain. 08/26/19  Yes Paz, Alda Berthold, MD  meclizine (ANTIVERT) 25 MG tablet Take 1 tablet (25 mg total) by mouth daily as needed (vertigo). 08/15/18  Yes Paz, Alda Berthold, MD  montelukast (SINGULAIR) 10 MG tablet Take 1 tablet (10 mg total) by mouth daily. 03/31/19  Yes Colon Branch, MD  Multiple Vitamin (MULTIVITAMIN WITH MINERALS) TABS Take 1 tablet by mouth daily.   Yes [provider]  OVER THE COUNTER MEDICATION Take 1 tablet by mouth in the morning and at bedtime. Cranberry supplement    Yes [provider]  rOPINIRole (REQUIP) 1 MG tablet Take 1 tablet (1 mg total) by mouth 3 (three) times daily. 05/07/19  Yes Paz, Alda Berthold, MD   senna-docusate (SENOKOT-S) 8.6-50 MG tablet Take 1 tablet by mouth 2 (two) times daily. 01/22/18  Yes Elgergawy, Silver Huguenin, MD  simethicone (MYLICON) 80 MG chewable tablet Chew 160 mg by mouth daily.    Yes [provider]  simvastatin (ZOCOR) 40 MG tablet Take 1 tablet (40 mg total) by mouth daily. 05/13/19  Yes Paz, Alda Berthold, MD  tamsulosin (FLOMAX) 0.4 MG CAPS capsule Take 1 capsule (0.4 mg total) by mouth daily. 09/09/19  Yes Paz, Alda Berthold, MD  traMADol (ULTRAM) 50 MG tablet Take 50 mg by mouth 2 (two) times daily as needed for moderate pain.    Yes [provider]  triamterene-hydrochlorothiazide (MAXZIDE-25) 37.5-25 MG tablet Take 0.5 tablets by mouth daily. 05/07/19  Yes Paz, Alda Berthold, MD  azithromycin (ZITHROMAX) 250 MG tablet Take 2 tablets by mouth first day, then 1 tablet for 4 additional days Patient not taking: Reported on 09/30/2019 09/11/19   Colon Branch, MD  ipratropium-albuterol (DUONEB) 0.5-2.5 (3) MG/3ML SOLN Take 3 mLs by nebulization 4 (four) times daily as needed. Patient not taking: Reported on 09/30/2019 09/10/19   Colon Branch, MD   Allergies  Allergen Reactions  . Anticoagulant Compound     History of bleeding on the brain per daughter  . Protonix [Pantoprazole Sodium] Diarrhea   Review of Systems  Unable to perform ROS: Mental status change    Physical Exam Vitals and nursing note reviewed.  Constitutional:      Comments: frail  Skin:    Coloration: Skin is pale.  Neurological:     Comments: Sleeping, unable to assess orientation     Vital Signs: BP (!) 92/49 (BP Location: Right Arm)   Pulse 83   Temp 98.4 F (36.9 C) (Oral)   Resp 16   Ht '5\' 5"'$  (1.651 m)   Wt 64.3 kg   SpO2 91%   BMI 23.59 kg/m  Pain Scale: 0-10 POSS *See Group Information*: 1-Acceptable,Awake and alert Pain Score: 0-No pain   SpO2: SpO2: 91 % O2 Device:SpO2: 91 % O2 Flow Rate: .O2 Flow Rate (L/min): 5 L/min  IO: Intake/output summary:   Intake/Output Summary (Last  24 hours) at 10/11/2019 1155 Last data filed at 10/11/2019 0900 Gross per 24 hour  Intake 120 ml  Output 575 ml  Net -455 ml    LBM: Last BM Date: 10/09/19 Baseline Weight: Weight: 68 kg Most recent weight: Weight: 64.3 kg     Palliative Assessment/Data: PPS: 20%     Thank you for this consult. Palliative medicine will continue to follow and assist as needed.   Time In: 1100  Time Out: 1210 Time Total: 70 minutes Greater than 50%  of this time was spent counseling and coordinating care related to the above assessment and plan.  Signed by: Mariana Kaufman, AGNP-C Palliative Medicine    Please contact Palliative Medicine Team phone at [phone] for questions and concerns.  For individual provider: See Shea Evans

## 2019-10-12 MED ORDER — ONDANSETRON HCL 4 MG PO TABS: 4.0000 mg | ORAL_TABLET | Freq: Four times a day (QID) | ORAL | 0 refills | Status: AC | PRN

## 2019-10-12 MED ORDER — OXYCODONE HCL 5 MG PO TABS: 5.0000 mg | ORAL_TABLET | ORAL | 0 refills | Status: AC | PRN

## 2019-10-12 MED ORDER — METHOCARBAMOL 500 MG PO TABS: 1000.0000 mg | ORAL_TABLET | Freq: Three times a day (TID) | ORAL | 0 refills | Status: AC | PRN

## 2019-10-12 NOTE — Discharge Summary (Signed)
Patient ID: Micheal Frank [id number] May 24, 1926 86 y.o.  Admit date: 09/30/2019 Discharge date: 10/12/2019  Admitting Diagnosis: 86 year old male status post fall Right 3-6, 10-11 rib fractures Right hemopneumothorax Chronic kidney disease History of COPD History of multiple basal cell carcinomas, melanoma  Discharge Diagnosis Patient Active Problem List   Diagnosis Date Noted  . Hemothorax on right   . Goals of care, counseling/discussion   . Advanced care planning/counseling discussion   . Palliative care by specialist   . Centrilobular emphysema ([place])   . Pneumothorax on right 10/01/2019  . Annual physical exam 03/12/2019  . Multiple rib fractures 01/17/2018  . Essential hypertension 01/16/2018  . BPH (benign prostatic hyperplasia) 01/16/2018  . Compression fracture of T12 vertebra ([place]) 06/24/2017  . Generalized weakness 06/24/2017  . Presence of left artificial knee joint 07/24/2016  . Chronic pain of left knee 07/24/2016  . Chronic midline low back pain without sciatica 07/24/2016  . Anxiety and depression 07/09/2016  . C. difficile diarrhea 05/04/2016  . Chronic cough 08/16/2015  . PCP NOTES >>>>>> 04/06/2015  . CTS (carpal tunnel syndrome) 11/16/2014  . Hyperlipidemia 08/17/2014  . Superficial bruising of chest wall 07/29/2014  . Urgency incontinence 01/23/2013  . Chronic kidney disease, stage III (moderate) ([place]) 01/20/2013  . At high risk for falls 11/28/2012  . ALLERGIC RHINITIS 12/09/2009  . Carcinoma in situ of skin 04/30/2008  . RESTLESS LEG SYNDROME 04/30/2008  . NEUROPATHY 04/30/2008  . GLAUCOMA, BORDERLINE 04/30/2008  . Meniere's disease 04/30/2008  . HEARING LOSS 04/30/2008  . COPD GOLD II  04/30/2008  . DJD (degenerative joint disease) 04/30/2008  . Osteoporosis 04/30/2008  . Sleep apnea 04/30/2008  . HISTORY OF ASBESTOS EXPOSURE 04/30/2008  89M fall Right 3-6, 10-11 rib fractures Right hemopneumothorax Chronic kidney  disease/urinary retention History of COPD  History of multiple basal cell carcinomas, melanoma RLS  Consultants none  Reason for Admission: Patient is a 86 year old male who arrives with a history of chronic kidney disease, COPD, history of brain bleed, recent pneumonia, who fell at home.  Patient is accompanied by his daughter who he lives with.  She states that he was likely trying to close a curtain, fell in between some chairs and fell along the right side of his torso onto some chairs.  Patient denies any LOC.  Patient's main complaint is of right thoracic wall chest pain.  Patient underwent CT scan upon arrival to the ED.  CT scan was significant for right hemopneumothorax, right ribs 3-6, 10-11 fractures.  Patient was placed on oxygen.  I did review the CT scan personally.  Surgery was consulted for evaluation and management.  Procedures Placement of a chest tube on admit  Hospital Course:  The patient was admitted and had a CT placed for his right PTX.  It was placed on suction.  Ultimately after several attempts to decrease suction and even waterseat the tube, his PTX continued to recur.  Much discussion was had with the family regarding need for betadine pleurodesis vs TCTS consult for possible OR etc.  The patient was becoming weaker with increasing FTT and poor appetite.  The daughters appropriately felt as if the patient would not be able to tolerate an operation and did not want to proceed with the pleurodesis either at the risk of worsening pain.  The decision was made to remove his chest tube and pursue hospice at home.  This was arranged and the patient was discharged on HD 11.  Physical Exam:  See exam from progress note on day of discharge  Allergies as of 10/12/2019      Reactions   Anticoagulant Compound    History of bleeding on the brain per daughter   Protonix [pantoprazole Sodium] Diarrhea      Medication List    STOP taking these medications     azithromycin 250 MG tablet Commonly known as: ZITHROMAX   HYDROcodone-acetaminophen 5-325 MG tablet Commonly known as: NORCO/VICODIN   ipratropium-albuterol 0.5-2.5 (3) MG/3ML Soln Commonly known as: DUONEB   traMADol 50 MG tablet Commonly known as: ULTRAM     TAKE these medications   acetaminophen 500 MG tablet Commonly known as: TYLENOL Take 2 tablets (1,000 mg total) by mouth every 8 (eight) hours as needed for mild pain.   albuterol 108 (90 Base) MCG/ACT inhaler Commonly known as: ProAir HFA Inhale 2 puffs into the lungs every 6 (six) hours as needed for wheezing.   benzonatate 200 MG capsule Commonly known as: TESSALON Take 1 capsule (200 mg total) by mouth 3 (three) times daily as needed for cough.   budesonide-formoterol 160-4.5 MCG/ACT inhaler Commonly known as: SYMBICORT Inhale 2 puffs into the lungs 2 (two) times daily.   buPROPion 200 MG 12 hr tablet Commonly known as: WELLBUTRIN SR Take 1 tablet (200 mg total) by mouth 2 (two) times daily.   calcium carbonate 600 MG Tabs tablet Commonly known as: OS-CAL Take 600 mg by mouth daily.   diazepam 5 MG tablet Commonly known as: VALIUM Take 1 tablet (5 mg total) by mouth every 6 (six) hours as needed for anxiety.   doxazosin 4 MG tablet Commonly known as: CARDURA Take 1 tablet (4 mg total) by mouth at bedtime.   famotidine 10 MG tablet Commonly known as: Pepcid AC Take 1 tablet (10 mg total) by mouth 2 (two) times daily.   fluticasone 50 MCG/ACT nasal spray Commonly known as: FLONASE Place 2 sprays into both nostrils 2 (two) times daily.   meclizine 25 MG tablet Commonly known as: ANTIVERT Take 1 tablet (25 mg total) by mouth daily as needed (vertigo).   methocarbamol 500 MG tablet Commonly known as: ROBAXIN Take 2 tablets (1,000 mg total) by mouth every 8 (eight) hours as needed for muscle spasms.   montelukast 10 MG tablet Commonly known as: SINGULAIR Take 1 tablet (10 mg total) by mouth  daily.   multivitamin with minerals Tabs tablet Take 1 tablet by mouth daily.   ondansetron 4 MG tablet Commonly known as: Zofran Take 1 tablet (4 mg total) by mouth every 6 (six) hours as needed for nausea or vomiting.   OVER THE COUNTER MEDICATION Take 1 tablet by mouth in the morning and at bedtime. Cranberry supplement   oxyCODONE 5 MG immediate release tablet Commonly known as: Oxy IR/ROXICODONE Take 1 tablet (5 mg total) by mouth every 4 (four) hours as needed for moderate pain or severe pain.   rOPINIRole 1 MG tablet Commonly known as: REQUIP Take 1 tablet (1 mg total) by mouth 3 (three) times daily.   senna-docusate 8.6-50 MG tablet Commonly known as: Senokot-S Take 1 tablet by mouth 2 (two) times daily.   simethicone 80 MG chewable tablet Commonly known as: MYLICON Chew 0000000 mg by mouth daily.   simvastatin 40 MG tablet Commonly known as: ZOCOR Take 1 tablet (40 mg total) by mouth daily.   tamsulosin 0.4 MG Caps capsule Commonly known as: FLOMAX Take 1 capsule (0.4 mg total) by mouth daily.   triamterene-hydrochlorothiazide  37.5-25 MG tablet Commonly known as: MAXZIDE-25 Take 0.5 tablets by mouth daily.        Follow-up Information    [facility] Follow up.   Contact information: [place] [phone]       Colon Branch, MD Follow up.   Specialty: Internal Medicine Why: as needed Contact information: [address] [place] [address] [phone]        [place] Follow up.   Why: no follow up needed, but call if you have questions that hospice unable to answer Contact information: [address] [phone]          Signed: Saverio Danker, [facility] Surgery 10/12/2019, 11:08 AM Please see Amion for pager number during day hours 7:00am-4:30pm, 7-11:30am on Weekends

## 2019-10-12 NOTE — Progress Notes (Signed)
   Subjective/Chief Complaint: No complaints this morning   Objective: Vital signs in last 24 hours: Temp:  [97.9 F (36.6 C)-98.6 F (37 C)] 97.9 F (36.6 C) (03/28 0737) Pulse Rate:  [82-91] 91 (03/28 0737) Resp:  [16-22] 20 (03/28 0737) BP: (104-122)/(50-70) 122/63 (03/28 0737) SpO2:  [86 %-96 %] 90 % (03/28 0818) Last BM Date: 10/11/19  Intake/Output from previous day: 03/27 0701 - 03/28 0700 In: 480 [P.O.:480] Out: 201 [Urine:200; Stool:1] Intake/Output this shift: Total I/O In: 60 [P.O.:60] Out: -   Exam: Awake, following commands  Lab Results:  No results for input(s): WBC, HGB, HCT, PLT in the last 72 hours. BMET No results for input(s): NA, K, CL, CO2, GLUCOSE, BUN, CREATININE, CALCIUM in the last 72 hours. PT/INR No results for input(s): LABPROT, INR in the last 72 hours. ABG No results for input(s): PHART, HCO3 in the last 72 hours.  Invalid input(s): PCO2, PO2  Studies/Results: DG CHEST PORT 1 VIEW  Result Date: 10/10/2019 CLINICAL DATA:  Recent right-sided pneumothorax. EXAM: PORTABLE CHEST 1 VIEW COMPARISON:  October 09, 2019 FINDINGS: Chest tube position on the right unchanged. There is a pleural effusion on the right with loculated components. There is fluid in the right apex. Apparent small pneumothorax remains in this area surrounded by fluid. Elsewhere, there is atelectatic change in the right mid lung and right base regions. There is also atelectasis in the left lung. Left lung otherwise is clear. Heart is upper normal in size with pulmonary vascularity normal. There is aortic atherosclerosis. No adenopathy. Bones are osteoporotic with arthropathy in each shoulder. Subcutaneous air remains on the right laterally. IMPRESSION: No change in chest tube position on the right. Subcutaneous air again noted on the right. Small right apical pneumothorax present with new right pleural effusion; pleural fluid is noted adjacent to the apical pneumothorax on the  right. Elsewhere, areas of patchy atelectasis noted. No frank airspace consolidation. Stable cardiac prominence.  Aortic Atherosclerosis (ICD10-I70.0). Electronically Signed   By: Lowella Grip III M.D.   On: 10/10/2019 10:59    Anti-infectives: Anti-infectives (From admission, onward)   None      Assessment/Plan: 80M fall Right 3-6, 10-11 rib fractures- stable Right hemopneumothorax-chest tube out Chronic kidney disease/urinary retention-cr ~1.5 at baseline, foley out History of COPD - continue home nebulizers and medications History of multiple basal cell carcinomas, melanoma RLS- cont requip FEN -Regular diet, ensure VTE -heparinsubq ID -none currently Pain - scheduled tylenol, scheduled robaxin, scheduled tramadol, PRN oxycodone, PRN HM Dispo - home with hospice today   LOS: 11 days    Micheal Frank 10/12/2019

## 2019-10-12 NOTE — Progress Notes (Signed)
Pt and daughter given D/C education. All questions answered. No printed prescriptions to give, equipment delivered to Pt's house. IV removed.  [facility] to transport Pt home.

## 2019-10-12 NOTE — TOC Transition Note (Signed)
Transition of Care [facility]) - CM/SW Discharge Note   Patient Details  Name: Micheal Frank MRN: [id number] Date of Birth: Mar 29, 1926  Transition of Care [facility]) CM/SW Contact:  Claudie Leach, RN 10/12/2019, 11:32 AM   Clinical Narrative:    Spoke with patient's daughter and with Ramiro Harvest, RN from [place] this am.  DME was delivered this am and hospice admission appointment is scheduled for 2pm.  Hospice requested that patient be transported home by [facility] ASAP.  [facility] called for transport.      Final next level of care: Home w Hospice Care Barriers to Discharge: Continued Medical Work up   Patient Goals and CMS Choice   CMS Medicare.gov Compare Post Acute Care list provided to:: Patient Represenative (must comment)(daughter) Choice offered to / list presented to : Adult Children   Discharge Plan and Services   Discharge Planning Services: CM Consult    [facility] Agency: [place] Date [place]: 10/12/19 Time [place]: [id number] Representative spoke with at [place]: Ramiro Harvest

## 2019-10-20 ENCOUNTER — Encounter: Payer: Self-pay | Admitting: Internal Medicine

## 2019-10-22 ENCOUNTER — Ambulatory Visit: Payer: Medicare Other | Admitting: Internal Medicine

## 2019-10-24 ENCOUNTER — Encounter: Payer: Self-pay | Admitting: Internal Medicine

## 2019-10-24 ENCOUNTER — Telehealth: Payer: Self-pay

## 2019-10-24 NOTE — Telephone Encounter (Signed)
Micheal Frank sent a [employer]- Pt passed away on 16-Nov-2019.

## 2019-10-24 NOTE — Telephone Encounter (Signed)
I called and  spoke with Micheal Frank provided, Micheal Frank was a true gentleman

## 2019-11-15 DEATH — deceased

## 2019-12-22 ENCOUNTER — Ambulatory Visit: Payer: Medicare Other | Admitting: Internal Medicine
